# Patient Record
Sex: Male | Born: 1940
Health system: Southern US, Community
[De-identification: ages and names within clinical notes are randomized; demographics above are authoritative.]

## PROBLEM LIST (undated history)

## (undated) DIAGNOSIS — M1990 Unspecified osteoarthritis, unspecified site: Secondary | ICD-10-CM

## (undated) DIAGNOSIS — K5090 Crohn's disease, unspecified, without complications: Secondary | ICD-10-CM

## (undated) DIAGNOSIS — R0600 Dyspnea, unspecified: Secondary | ICD-10-CM

## (undated) DIAGNOSIS — C801 Malignant (primary) neoplasm, unspecified: Secondary | ICD-10-CM

## (undated) DIAGNOSIS — R7881 Bacteremia: Secondary | ICD-10-CM

## (undated) DIAGNOSIS — M259 Joint disorder, unspecified: Secondary | ICD-10-CM

## (undated) DIAGNOSIS — Z87442 Personal history of urinary calculi: Secondary | ICD-10-CM

## (undated) DIAGNOSIS — E1151 Type 2 diabetes mellitus with diabetic peripheral angiopathy without gangrene: Secondary | ICD-10-CM

## (undated) DIAGNOSIS — I82409 Acute embolism and thrombosis of unspecified deep veins of unspecified lower extremity: Secondary | ICD-10-CM

## (undated) DIAGNOSIS — I2699 Other pulmonary embolism without acute cor pulmonale: Secondary | ICD-10-CM

## (undated) DIAGNOSIS — I739 Peripheral vascular disease, unspecified: Secondary | ICD-10-CM

## (undated) DIAGNOSIS — Z85828 Personal history of other malignant neoplasm of skin: Secondary | ICD-10-CM

## (undated) DIAGNOSIS — E119 Type 2 diabetes mellitus without complications: Secondary | ICD-10-CM

## (undated) HISTORY — PX: COLON SURGERY: SHX602

## (undated) HISTORY — PX: CHOLECYSTECTOMY: SHX55

## (undated) HISTORY — PX: SHOULDER SURGERY: SHX246

## (undated) HISTORY — PX: ABDOMINAL SURGERY: SHX537

## (undated) HISTORY — PX: JOINT REPLACEMENT: SHX530

## (undated) HISTORY — PX: HIP SURGERY: SHX245

---
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
## 2001-01-29 ENCOUNTER — Emergency Department (HOSPITAL_COMMUNITY): Admission: EM | Admit: 2001-01-29 | Discharge: 2001-01-29 | Payer: Self-pay | Admitting: *Deleted

## 2001-02-05 ENCOUNTER — Emergency Department (HOSPITAL_COMMUNITY): Admission: EM | Admit: 2001-02-05 | Discharge: 2001-02-05 | Payer: Self-pay | Admitting: Emergency Medicine

## 2003-08-15 ENCOUNTER — Inpatient Hospital Stay (HOSPITAL_COMMUNITY): Admission: RE | Admit: 2003-08-15 | Discharge: 2003-08-18 | Payer: Self-pay | Admitting: Orthopedic Surgery

## 2008-03-14 ENCOUNTER — Inpatient Hospital Stay (HOSPITAL_COMMUNITY): Admission: EM | Admit: 2008-03-14 | Discharge: 2008-03-15 | Payer: Self-pay | Admitting: Emergency Medicine

## 2008-10-23 ENCOUNTER — Emergency Department (HOSPITAL_COMMUNITY): Admission: EM | Admit: 2008-10-23 | Discharge: 2008-10-24 | Payer: Self-pay | Admitting: Emergency Medicine

## 2009-10-05 ENCOUNTER — Inpatient Hospital Stay (HOSPITAL_COMMUNITY): Admission: RE | Admit: 2009-10-05 | Discharge: 2009-10-07 | Payer: Self-pay | Admitting: Orthopedic Surgery

## 2010-07-09 ENCOUNTER — Emergency Department (HOSPITAL_BASED_OUTPATIENT_CLINIC_OR_DEPARTMENT_OTHER)
Admission: EM | Admit: 2010-07-09 | Discharge: 2010-07-09 | Payer: Self-pay | Source: Home / Self Care | Admitting: Emergency Medicine

## 2010-09-17 LAB — CBC
MCV: 83.4 fL (ref 78.0–100.0)
Platelets: 265 10*3/uL (ref 150–400)
RBC: 5.9 MIL/uL — ABNORMAL HIGH (ref 4.22–5.81)
RDW: 13.2 % (ref 11.5–15.5)
WBC: 26.4 10*3/uL — ABNORMAL HIGH (ref 4.0–10.5)

## 2010-09-17 LAB — BASIC METABOLIC PANEL
BUN: 19 mg/dL (ref 6–23)
Calcium: 10.4 mg/dL (ref 8.4–10.5)
Chloride: 104 mEq/L (ref 96–112)
Creatinine, Ser: 1.3 mg/dL (ref 0.4–1.5)
GFR calc Af Amer: >60 mL/min (ref >60)
GFR calc non Af Amer: 55 mL/min — ABNORMAL LOW (ref >60)

## 2010-09-17 LAB — DIFFERENTIAL
Basophils Absolute: 0 10*3/uL (ref 0.0–0.1)
Eosinophils Absolute: 0 10*3/uL (ref 0.0–0.7)
Lymphocytes Relative: 11 % — ABNORMAL LOW (ref 12–46)
Monocytes Relative: 10 % (ref 3–12)
Neutro Abs: 20.9 10*3/uL — ABNORMAL HIGH (ref 1.7–7.7)
Neutrophils Relative %: 79 % — ABNORMAL HIGH (ref 43–77)

## 2010-09-26 LAB — CBC
HCT: 38.3 % — ABNORMAL LOW (ref 39.0–52.0)
HCT: 39 % (ref 39.0–52.0)
Hemoglobin: 12.6 g/dL — ABNORMAL LOW (ref 13.0–17.0)
Hemoglobin: 12.8 g/dL — ABNORMAL LOW (ref 13.0–17.0)
Hemoglobin: 12.9 g/dL — ABNORMAL LOW (ref 13.0–17.0)
MCHC: 33 g/dL (ref 30.0–36.0)
MCHC: 33.1 g/dL (ref 30.0–36.0)
Platelets: 195 10*3/uL (ref 150–400)
RBC: 4.23 MIL/uL (ref 4.22–5.81)
RDW: 13.2 % (ref 11.5–15.5)
RDW: 13.6 % (ref 11.5–15.5)
WBC: 23.4 10*3/uL — ABNORMAL HIGH (ref 4.0–10.5)

## 2010-09-26 LAB — BASIC METABOLIC PANEL
BUN: 16 mg/dL (ref 6–23)
CO2: 28 mEq/L (ref 19–32)
Calcium: 8.1 mg/dL — ABNORMAL LOW (ref 8.4–10.5)
GFR calc non Af Amer: >60 mL/min (ref >60)
Glucose, Bld: 134 mg/dL — ABNORMAL HIGH (ref 70–99)
Glucose, Bld: 189 mg/dL — ABNORMAL HIGH (ref 70–99)
Potassium: 3.8 mEq/L (ref 3.5–5.1)
Potassium: 4.3 mEq/L (ref 3.5–5.1)
Sodium: 135 mEq/L (ref 135–145)
Sodium: 138 mEq/L (ref 135–145)

## 2010-09-30 LAB — COMPREHENSIVE METABOLIC PANEL
AST: 38 U/L — ABNORMAL HIGH (ref 0–37)
CO2: 30 mEq/L (ref 19–32)
Calcium: 9.6 mg/dL (ref 8.4–10.5)
Creatinine, Ser: 1.15 mg/dL (ref 0.4–1.5)
GFR calc Af Amer: >60 mL/min (ref >60)
GFR calc non Af Amer: >60 mL/min (ref >60)
Total Protein: 7.2 g/dL (ref 6.0–8.3)

## 2010-09-30 LAB — CBC
MCHC: 33.1 g/dL (ref 30.0–36.0)
MCV: 89.9 fL (ref 78.0–100.0)
Platelets: 216 10*3/uL (ref 150–400)
RBC: 5.46 MIL/uL (ref 4.22–5.81)
RDW: 13 % (ref 11.5–15.5)

## 2010-09-30 LAB — APTT: aPTT: 43 seconds — ABNORMAL HIGH (ref 24–37)

## 2010-09-30 LAB — CROSSMATCH: Antibody Screen: NEGATIVE

## 2010-09-30 LAB — URINALYSIS, ROUTINE W REFLEX MICROSCOPIC
Bilirubin Urine: NEGATIVE
Hgb urine dipstick: NEGATIVE
Ketones, ur: NEGATIVE mg/dL
Protein, ur: NEGATIVE mg/dL
Urobilinogen, UA: 0.2 mg/dL (ref 0.0–1.0)

## 2010-09-30 LAB — DIFFERENTIAL
Eosinophils Relative: 0 % (ref 0–5)
Lymphocytes Relative: 20 % (ref 12–46)
Lymphs Abs: 1.8 10*3/uL (ref 0.7–4.0)

## 2010-09-30 LAB — PROTIME-INR
INR: 0.94 (ref 0.00–1.49)
Prothrombin Time: 12.5 seconds (ref 11.6–15.2)

## 2010-10-17 LAB — CBC
HCT: 52.9 % — ABNORMAL HIGH (ref 39.0–52.0)
MCV: 89.8 fL (ref 78.0–100.0)
Platelets: 214 10*3/uL (ref 150–400)
RDW: 12.8 % (ref 11.5–15.5)

## 2010-10-17 LAB — URINALYSIS, ROUTINE W REFLEX MICROSCOPIC
Nitrite: NEGATIVE
Specific Gravity, Urine: 1.037 — ABNORMAL HIGH (ref 1.005–1.030)
Urobilinogen, UA: 0.2 mg/dL (ref 0.0–1.0)

## 2010-10-17 LAB — COMPREHENSIVE METABOLIC PANEL
Albumin: 4.1 g/dL (ref 3.5–5.2)
BUN: 23 mg/dL (ref 6–23)
Chloride: 101 mEq/L (ref 96–112)
Creatinine, Ser: 1.62 mg/dL — ABNORMAL HIGH (ref 0.4–1.5)
Glucose, Bld: 180 mg/dL — ABNORMAL HIGH (ref 70–99)
Total Bilirubin: 1.9 mg/dL — ABNORMAL HIGH (ref 0.3–1.2)
Total Protein: 7.4 g/dL (ref 6.0–8.3)

## 2010-10-17 LAB — URINE MICROSCOPIC-ADD ON

## 2010-10-17 LAB — DIFFERENTIAL
Basophils Absolute: 0.1 10*3/uL (ref 0.0–0.1)
Lymphocytes Relative: 2 % — ABNORMAL LOW (ref 12–46)
Monocytes Absolute: 1.1 10*3/uL — ABNORMAL HIGH (ref 0.1–1.0)
Neutro Abs: 19.9 10*3/uL — ABNORMAL HIGH (ref 1.7–7.7)
Neutrophils Relative %: 92 % — ABNORMAL HIGH (ref 43–77)

## 2010-11-20 NOTE — Discharge Summary (Signed)
NAME:  Rodney Sullivan, Rodney Sullivan NO.:  [id number]   MEDICAL RECORD NO.:  [id number]          PATIENT TYPE:  INP   LOCATION:  7262                         FACILITY:  [facility]   PHYSICIAN:  Sherryl Manges, M.D.  DATE OF BIRTH:  Feb 14, 1941   DATE OF ADMISSION:  03/14/2008  DATE OF DISCHARGE:  03/15/2008                               DISCHARGE SUMMARY   PRIMARY MEDICAL DOCTOR:  Harriet Butte, [facility], [place], [facility] ,  [place], [place].   PRIMARY GASTROENTEROLOGIST:  Dr. Virgel Bouquet,  [place], [place].   DISCHARGE DIAGNOSIS:  Flare-up of Crohn's disease.   DISCHARGE MEDICATIONS:  1. Cimzia s.c. x1 per month.  2. Pentasa 500 mg p.o. b.i.d.  3. AndroGel in pre-admission dosage.  4. Kapidex 1 tablet p.o. daily.  5. Singulair 10 mg p.o. daily.  6. Allopurinol 100 mg p.o. daily.  7. Prednisone 10 mg p.o. daily.  8. Benadryl 25 mg p.o. b.i.d.  9. Calcium in pre-admission dosage.  10.Ciprofloxacin 500 mg p.o. b.i.d. for 7 days only.  11.Flagyl 500 mg p.o. t.i.d. for 7 days only.   PROCEDURES.:  1. Abdominal/chest x-ray dated March 14, 2008.  This showed      nonobstructive bowel gas pattern, nonspecific acid levels with      nondistended bowel.  Right hip total arthroplasty.  Avascular      necrosis left femoral head not excluded.  Low lung volumes was      grossly clear lungs.  2. Abdominal/pelvic CT scan dated March 14, 2008.  This showed      evidence of recurrent Crohn's disease that involved the near      terminal ileum and proximal colon.  Linear soft  tissue extends      from the duodenum to the distal small bowel and proximal colon.      Cannot exclude fistula.  No acute findings in the pelvis.   CONSULTATIONS:  Ronald Lobo, M.D., Gastroenterologist.   ADMISSION HISTORY:  As in H&P notes of March 14, 2008 dictated by Dr.  Gean Birchwood.  However in brief, this is a 70 year old male, with a  known history of Crohn's disease  since 1962, status post right-sided  bowel resection, status post cholecystectomy, avascular necrosis right  hip status post hip surgery, gout, allergic rhinitis, presenting with a  2 day history of right lower quadrant abdominal pain, associated with 2-  3 episodes of vomiting and  one episode of diarrhea.  He was admitted  for further evaluation, investigation and management.   CLINICAL COURSE.:  1. Flare-up of Crohn's disease.  For details of presentation, refer to      admission history above.  The patient was managed with bowel rest,      intravenous fluid hydration, parenteral steroids, as well as      intravenous Flagyl and Ciprofloxacin.  He was continued on pre-      admission doses of Pentasa.  GI consultation was kindly provided by      Dr. Ronald Lobo.  The patient experienced dramatic improvement      in symptoms  by a.m. of March 15, 2008, and was very keen to be      discharged today.  Per GI opinion, he was deemed clinically stable      for discharge.  He did have intriguing findings on his      abdominal/pelvis CT scan.  However, GI has reviewed this and felt      that the findings were not unduly worrisome.  I did have a      telephone discussion with the patient's primary MD, Harriet Butte, [place],      [place], [facility], [place], [place], who      assured me that the patient's CT scans were always difficult to      interpret and that in the past, the patient has had dramatic      response to short-term treatment for flare-up of his Crohn's      disease.  Be that as it may, the patient was very keen to be      discharged, was deemed clinically stable, and was subsequently      discharged.  GI recommendation: 7-day course of Ciprofloxacin and      Flagyl as well as oral Prednisone.  He has been recommended to      continue to follow up with his primary MD and with his primary      gastroenterologist, Dr. Ferdinand Lango, in [place], [place].   1.  History of gout.  There were no problems referable to this.   1. History of allergic rhinitis.  This did not prove problematic.   DISPOSITION:  The patient was discharged on March 15, 2008.  He has  been recommended to increase activity slowly.   DIET:  Low residue.   FOLLOWUP INSTRUCTIONS:  To follow up with his primary MD, Harriet Butte,  [place], [place] and his primary gastroenterologist, Dr. Ferdinand Lango in  [place],  [place], within the coming week. All of this has been  communicated to the patient.  He has verbalized understanding.      Sherryl Manges, M.D.  Electronically Signed     CO/MEDQ  D:  03/15/2008  T:  03/15/2008  Job:  [id number]   cc:   Virgel Bouquet  Fax: [phone]   Harriet Butte, [place], [facility]  [place], [place]

## 2010-11-20 NOTE — H&P (Signed)
NAME:  Rodney Sullivan, Rodney Sullivan NO.:  [id number]   MEDICAL RECORD NO.:  [id number]          PATIENT TYPE:  EMS   LOCATION:  ED                           FACILITY:  [facility]   PHYSICIAN:  Rise Patience, MDDATE OF BIRTH:  [birth year]   DATE OF ADMISSION:  03/14/2008  DATE OF DISCHARGE:                              HISTORY & PHYSICAL   The patient is unassigned.   CHIEF COMPLAINT:  Right lower quadrant pain.   HISTORY OF PRESENT ILLNESS:  The patient is a 70 year old male with  history of Crohn's disease since 1962 on prednisone and Cimzia presented  to the ER complaining of right lower quadrant pain yesterday morning.  The patient had 2 or 3 episodes of vomiting which had no blood in it and  1 episode of diarrhea.  The patient in addition was found to have fever  along with a UA which was compatible with UTI.  CAT scan of the abdomen  and pelvis done in the ER was also showing  of Crohn's disease.  The  patient is being admitted for further management.  The patient states  abdominal pain is dull aching of the right upper and lower quadrant,  nonradiating, constant which is improved now along with as earlier  mentioned nausea and vomiting twice and episode of diarrhea.  The  patient does not complain of any dysuria.  Denies any chest pain,  shortness of breath, dizziness, loss of consciousness, weakness of  limbs.   PAST MEDICAL HISTORY:  1. Crohn's disease on prednisone and Cimzia.  2. Avascular necrosis of right hip.  3. Gout.  4. History of allergic rhinitis.   PAST SURGICAL HISTORY:  Cholecystectomy, right-sided bowel resection in  1962 and 1964 and hip surgery.   MEDICATIONS ON ADMISSION:  1. The patient is on Cimzia every month IV infusion.  2. Pentasa 500 mg twice a day.  3. AndroGel.  4. Kapidex.  5. Singulair 10 mg daily.  6. Allopurinol 100 mg p.o. daily.  7. Prednisone 10 mg daily.  8. Benadryl 25 mg b.i.d.  9. Calcium.   ALLERGIES:  No known drug  allergies.   SOCIAL HISTORY:  The patient lives with his wife, denies smoking  cigarettes, drinking alcohol or using illegal drugs.   FAMILY HISTORY:  Noncontributory.   REVIEW OF SYSTEMS:  As in history of present illness, nothing of  significance.   PHYSICAL EXAMINATION:  GENERAL:  The patient examined at bedside, not in  acute distress.  VITAL SIGNS:  Blood pressure is 178/69, pulse 90 per minute, temperature  101.7, respirations 18 per minute, O2 saturation 96%.  HEENT:  Sclerae  anicteric.  No pallor.  CHEST:  Bilateral air entry present.  No rhonchi.  No crepitation.  HEART:  S1, S2 heard.  ABDOMEN:  Soft and nontender.  Bowel sounds heard.  No guarding or  rigidity.  No discoloration.  CNS:  The patient is alert, awake, oriented to time, place and person.  Moves upper and lower extremities 5/5.  EXTREMITIES:  Peripheral pulses felt.  No edema.   LABORATORIES:  CT of the  pelvis shows evidence of recurrent Crohn's  disease of the  terminal ileum and proximal colon.  Linear soft tissue  extends from the duodenum to the distal small bowel and proximal colon,  cannot exclude fistula.  Pelvis:  No acute findings.  CBC:  WBC is 10.4,  hemoglobin 17, hematocrit 50, platelets 136, neutrophils 83%, PT/INR 14  and 1.1 respectively.  Complete metabolic panel:  Sodium 753, potassium  3.7, chloride 105, carbon dioxide 24, glucose 118, BUN 17, creatinine  1.3, total bilirubin 1.2, alkaline phosphatase 48, AST 34, ALT 32, total  protein 6.5, albumin 3.7, calcium 9.1.  UA:  Color was amber with small  ketones 40, blood negative, protein 30, nitrites negative, leukocytes  small, WBC 3-6, bacteria rare.   ASSESSMENT:  1. Exacerbation of Crohn's disease.  2. Urinary tract infection.  3. History of gout.  4. History of allergic rhinitis.  5. History of avascular necrosis of right hip.   PLAN:  Admit the patient to medical floor.  Will place the patient on IV  fluids, n.p.o. except  medication.  Follow blood cultures and urine  cultures.  Continue Flagyl and Cipro, IV steroids.  Will get a  GI  consult for further recommendations.  Further recommendations as the  patient's condition evolves.      Rise Patience, MD  Electronically Signed     ANK/MEDQ  D:  03/14/2008  T:  03/14/2008  Job:  [id number]

## 2010-11-23 NOTE — Op Note (Signed)
NAME:  Rodney Sullivan, Rodney Sullivan                          ACCOUNT NO.:  [id number]   MEDICAL RECORD NO.:  [id number]                   PATIENT TYPE:  INP   LOCATION:  5007                                 FACILITY:  [facility]   PHYSICIAN:  John L. Rendall III, M.D.           DATE OF BIRTH:  [birth year]   DATE OF PROCEDURE:  08/15/2003  DATE OF DISCHARGE:                                 OPERATIVE REPORT   PREOPERATIVE DIAGNOSIS:  Aseptic necrosis with osteoarthritic change, right  hip.   POSTOPERATIVE DIAGNOSIS:  Aseptic necrosis with osteoarthritis change, right  hip.   PROCEDURE:  Right Prodigy total hip replacement.   SURGEON:  John L. Rendall, M.D.   ASSISTANT:  Vonita Moss. Duffy, P.A.-C.   ANESTHESIA:  General.   PATHOLOGY:  The patient has ping-pong ball like texture to the femoral head  with wrinkles in it, and there is degenerative change of the adjacent  acetabulum both off center around the fovea and centrally.   DESCRIPTION OF PROCEDURE:  Under general anesthesia, the patient was placed  in the left lateral decubitus position, and the right hip was prepared with  DuraPrep and draped as a sterile field.  An approximate 6 inch incision was  made splitting the IT band in the line of its fibers.  Charnley retractor  was inserted.  The short external rotators and hip capsule were taken down  from bone with electrocautery.  About dozen bleeding vessels were  encountered that all cauterized.  The hip capsule was then opened in a T-  shaped manner, and the hip was dislocated.  The superior femoral neck was  exposed.  The canal finder and IM initiator are used in reverse order and  then the femoral canal is progressively reamed up to 16 mm.  The femoral  neck is then cut using a template to assist in the angle of this.  The  femoral canal was then progressively rasped using 12, 13.5, 15 and 16.5  mirror rasps.  A calcar reamer was used on the last two to get an optimal  fit on the calcar.   Excellent fit was obtained with the final rasp.  At this  point, attention was turned to the acetabulum.  It is exposed with two  Cobra's inferiorly, the hip superiorly.  The labrum and ligamentum teres  were excised.  The acetabulum was then progressively reamed up to a size 51  or 52 acetabulum.  A trial acetabular component is impacted and poly is  inserted in it.  A trial rasp both AML standard or AML narrow with a 32 ball  0 and +5 were used.  It dislocates a little too easily in internal rotation,  but the Prodigy neck is more stable and fits better.  Consequently,  permanent components are then obtained for the Prodigy 16.5 narrow, +5 32 mm  hip ball and the pinnacle cup with poly for a 32 mm  hip ball within the 52  mm acetabulum.  Permanent components are inserted and after all are  inserted, trial range of motion reveals excellent fit, alignment and  stability.  It should be noted excellent scratch fit was noted on the  femoral component for the last 5 cm of insertion.  The hip capsule was then  closed with #1 Ticron.  Short external rotators were reattached with #1  Ticron, IT band closed with mattress suture, #1 Ticron, subcu with 0 and 2-0  Vicryl and skin with clips.  Operative time approximately 55 minutes.  The  patient tolerated the procedure well and returned to recovery in good  condition.                                               John L. Wynona Luna, M.D.   Judie Grieve  D:  08/15/2003  T:  08/15/2003  Job:  [id number]

## 2010-11-23 NOTE — Discharge Summary (Signed)
NAME:  Rodney Sullivan, Rodney Sullivan                          ACCOUNT NO.:  [id number]   MEDICAL RECORD NO.:  [id number]                   PATIENT TYPE:  INP   LOCATION:  5007                                 FACILITY:  [facility]   PHYSICIAN:  John L. Rendall, M.D.               DATE OF BIRTH:  28-Feb-1941   DATE OF ADMISSION:  08/15/2003  DATE OF DISCHARGE:  08/18/2003                                 DISCHARGE SUMMARY   ADMITTING DIAGNOSES:  1. Aseptic necrosis, right hip.  2. Hiatal hernia.  3. Crohn's disease.  4. Gout.   DISCHARGE DIAGNOSES:  1. Status post right AML total hip.  2. Acute blood loss anemia secondary to surgery, asymptomatic.  3. Crohn's disease.  4. Gout.  5. Hiatal hernia.   HISTORY OF PRESENT ILLNESS:  [name] is a 70 year old white male with a  history of Crohn's disease and chronic prednisone use.  The patient started  developing right hip and leg discomfort around August of 2004.  Initial x-  rays were benign, however, over time, his pain progressed and therefore an  MRI of his right hip was obtained.  MRI showed significant aseptic necrosis  of his right hip.  The patient's right hip pain is mostly located in the  lateral aspect of his right thigh.  Pain causes him to limp significantly.  He has an occasional sensation of grinding in the hip.  He does have night  pain.  He uses a cane for assistance with ambulation.  Due to the patient's  asymptomatic necrosis of right hip, he was admitted to [facility]  on August 15, 2003 to undergo a right total hip arthroplasty.   ALLERGIES:  No known drug allergies.  He does have a food allergy to [facility].   CURRENT MEDICATIONS:  1. Allopurinol 100 mg p.o. daily.  2. Asacol 800 mg 1 p.o. t.i.d.  3. Prednisone 10 mg p.o. every other day.  4. Tramadol 50 mg p.o. b.i.d.  5. Vicodin 1-2 tablets daily p.r.n. pain.  6. Benadryl 25 mg p.o. b.i.d.  7. Multivitamin with iron.  8. Calcium plus vitamin D.  9. Investigational   medication related to Remicade, tissue-necrosing factor     medication for Crohn's disease.  The patient takes 1 dosage every 2     weeks.  Dr. Virgel Bouquet is the physician involved with this     investigational medication at [facility].   SURGICAL PROCEDURE:  The patient was taken to the operating room on August 15, 2003 by Dr. Jenny Reichmann L. Rendall, assisted by Vonita Moss. Duffy, P.A.-C.  The  patient was placed under general anesthesia and the Prodigy total hip  replacement was performed.  Components used:  Prodigy 6.5 narrow, +5 32-mm  hip ball and a Pinnacle cup with a poly for a 32-mm hip ball within the 52-  mm acetabulum.  The patient tolerated the procedure  well and returned to  recovery in good and stable condition.   CONSULTS:  1. PT.  2. Case Management.   HOSPITAL COURSE:  The patient developed acute blood loss anemia secondary to  surgery, however, remained asymptomatic and required no blood products.  The  patient did develop transient fever, however, the patient was afebrile at  time of discharge.  The patient did develop leukocytosis, however, this was  resolving at the time of discharge.  White blood count was 13,900.  Leukocytosis was felt to be due to chronic prednisone use and surgery.  At  the time of discharge, patient's vital signs were stable and he was  afebrile.  He was discharged to home on postoperative day 3 in good and  stable condition.   LABORATORIES:  Routine labs on admission:  CBC -- white blood count 10.8,  hemoglobin 13.3, hematocrit at 39.6, platelets 395,000.  Coagulations on  admission:  PT 13, INR 1.0, PTT 35.  Routine chemistries:  Sodium 138,  potassium 4.2, chloride 103, bicarb 28, glucose 143, BUN 10, creatinine 1.0.  Hepatic enzymes were negative.  Urinalysis on admission was negative.   X-rays dated August 15, 2003:  AP pelvis with AP frog-leg view of the right  hip showed deformity of the right femoral head suggesting  avascular  necrosis.  No acute abnormalities were noted.  Postoperative right hip, 2  views, dated August 15, 2003 showed satisfactory appearance following right  total hip replacement.  No periprosthetic fractures were noted.   DISCHARGE INSTRUCTIONS:   MEDICATIONS:  The patient may resume preoperative medications except for  Vicodin while on other pain medicines.  The following medications were added  to his medication regimen:  1. Arixtra 2.5 mg 1 injection subcutaneously at 8 p.m. daily, last dose on     September 18, 2003, then the patient is to begun an 81 mg aspirin, enteric     coated, on August 22, 2003 for 1 month.  2. OxyContin 10 mg sustained release 1 tablet q.12 h.  3. Percocet 5 mg 1-2 tablets q.4-6 h. as needed for pain.   ACTIVITY:  The patient is weightbearing as tolerated with a walker.   DIET:  No restrictions.   WOUND CARE:  The patient is to perform daily dressing changes.  May shower  after 2 days if no drainage.  The patient is to call  Dr. Roxan Diesel office  if he develops any of the following:  Temperature greater than 101.5,  chills, swelling, foul-smelling drainage from the wound site or pains not  controlled with pain medication.   FOLLOWUP:  The patient needs to follow up with Dr. Telford Nab in approximately  10-12 days from day of discharge.  The patient is to call our office at [phone] to make an appointment.      Erskine Emery, P.A.                       John L. Telford Nab, M.D.    GC/MEDQ  D:  09/20/2003  T:  09/23/2003  Job:  [id number]   cc:   Virgel Bouquet  [address]  [place]  [place] [address]  Fax: [place]. Rendall III, M.D.  [place]  [place] [address]  Fax: [phone]

## 2010-11-23 NOTE — H&P (Signed)
NAME:  Rodney Sullivan, Rodney Sullivan                          ACCOUNT NO.:  [id number]   MEDICAL RECORD NO.:  [id number]                   PATIENT TYPE:  INP   LOCATION:  NA                                   FACILITY:  [facility]   PHYSICIAN:  Evert Kohl, P.A.                DATE OF BIRTH:  08/24/1940   DATE OF ADMISSION:  DATE OF DISCHARGE:                                HISTORY & PHYSICAL   CHIEF COMPLAINT:  Right hip pain.   HISTORY OF PRESENT ILLNESS:  The patient is a 70 year old white male with a  history of Crohn's disease and chronic prednisone use. The patient started  developing right hip limp and discomfort around last August. Initial x-rays  were benign, but over time, his pain progressively increased with ambulation  and range of motion. The MRIs were obtained which shows significant aseptic  necrosis of his right hip. The patient states he has a significant limp. He  has pain in the lateral aspect of his thigh. It increases with time. He  occasionally has grinding in the hip. He does have night pain. He is  currently using a cane to assist ambulation. He has no previous injuries.   DRUG ALLERGIES:  No known drug allergies, but allergic to [facility].   CURRENT MEDICATIONS:  1. Allopurinol 100 mg p.o. daily  2. Asacol 800 mg p.o. t.i.d.  3. Prednisone 10 mg p.o. every other day with next dose on Friday, August 05, 2003.  4. Tramadol 50 mg p.o. b.i.d.  5. Vicodin one or two tablets daily p.r.n.  6. Benadryl 25 mg p.o. b.i.d.  7. Multivitamins with iron.  8. Calcium plus vitamin D.  9. Investigational medication related to Remicade, tissue necrosing factor     medication for his Crohn's disease. He is currently taking one dosage     every two weeks. He will have it the Monday previous to his surgery and     the week after his surgery. Dr. Harrell Lark is the physician with this     investigational medication at [facility].   CURRENT MEDICAL HISTORY:  1. Hiatal  hernia.  2. Crohn's disease.  3. Gout.   PAST SURGICAL HISTORY:  1. Nodule removed from his neck in 1957.  2. Bowel resection in 1962.  3. Repeat bowel resection in 1965.  4. Gallbladder surgery in 1997 x 2. The patient developed an ileus requiring     an NG tube with this procedure. Otherwise, the patient has not had any     other complications due to anesthesia.   SOCIAL HISTORY:  The patient is a healthy-appearing, well-developed 41-year-  old white male with no history of smoking or alcohol use. He is married. He  does have two grown children. He lives in a single-family home, two steps.  He is currently employed and working as a Therapist, sports at [employer].   FAMILY PHYSICIAN:  Dr. Harrell Lark at [facility] in [place].   FAMILY MEDICAL HISTORY:  Mother is alive in a nursing home with a history of  CVA. Father is deceased from an accident. The patient has one sister alive  and in good medical health.   REVIEW OF SYSTEMS:  Positive for flu this last week with significant GI  complications, but the patient currently states that over the last two days,  he is significantly improved and feels near normal with no lingering  sequelae. The patient does have upper and lower dentures. He does wear  glasses at times. He does have problems with diarrhea at times related to  his Crohn's disease.   PHYSICAL EXAMINATION:  VITAL SIGNS:  Height is 5 feet 7 inches. Weight is  162 pounds. Pulse 84 and regular. Respirations 12. Blood pressure 105/60.  The patient is afebrile.  GENERAL:  This is a healthy-appearing well-developed white male. He does  ambulate with his cane in his left hand. He does have a significant right-  sided limp when transitioning from the sitting to standing position. The  patient does appear to have some stiffness and discomfort, but is able to  get on and off the exam table by himself.  HEENT:  Head was normocephalic. Pupils equal, round and  reactive and  accommodating to light. Extraocular movements intact. Sclerae anicteric.  External ears without deformities. Hearing is grossly intact. Nasal septum  is midline. Oral buccal mucosa was pink and moist. Upper and lower dentures  were in place.  NECK:  Supple. No palpable lymphadenopathy. Thyroid region was nontender.  The patient had excellent range of motion of his cervical spine without any  difficulty or tenderness. He has no tenderness with percussion along the  entire spinal column.  CHEST:  Lung sounds were clear and equal bilaterally. No wheezing, rales,  rhonchi, or rubs noted.  HEART:  Regular rate and rhythm. S1 and S2 are auscultated. No murmurs, rubs  or gallops noted.  ABDOMEN:  Round, soft and bowel sounds were present throughout. No c.v.a.  region tenderness.  EXTREMITIES:  Upper extremities were symmetrical in size and shape. He had  full range of motion of his shoulders, elbows and wrists. Motor strength was  5/5.  Lower extremities: Right and left hip at full extension. Right hip and  left hip were able to flex up to 130 degrees. Right hip had 10 degrees  internal rotation before the patient started leaning and having discomfort.  He had 20 degrees external rotation. Left hip had 20 degrees internal and  external rotation without any discomfort. Bilateral knees were symmetrical  size and shape. No discomfort throughout. Normal range of motion. No  instability. The calves were nontender. Ankles were symmetrical with good  dorsi plantar flexion.  PERIPHERAL VASCULAR:  Carotid pulses were 2+ with no bruits. Radial pulses  were 2+. Dorsalis pedis and posterior tibial pulses were 2+. No lower  extremity edema or venostasis changes.  NEURO: The patient was conscious, alert and appropriate and easily  conversational with the examiner. Cranial nerves II through XII were grossly intact. Deep tendon reflexes of the upper and lower extremities were  symmetrical right  to left. He had no gross neurologic defects noted.  BREASTS, RECTAL AND GU:  Deferred at this time.   IMPRESSION:  1. Aseptic necrosis, right hip.  2. Hiatal hernia.  3. Crohn's disease.  4. Gout.   PLAN:  The patient will  undergo all routine laboratory test prior to having  his right total hip arthroplasty performed by Dr. Telford Nab at [facility] on August 14, 2002. The patient will undergo all routine  laboratory and tests prior to having this procedure.                                                Evert Kohl, P.A.    RWK/MEDQ  D:  08/04/2003  T:  08/04/2003  Job:  [id number]

## 2011-04-10 LAB — URINE CULTURE

## 2011-04-10 LAB — COMPREHENSIVE METABOLIC PANEL
ALT: 28
ALT: 32
AST: 34
Alkaline Phosphatase: 46
BUN: 14
CO2: 25
Calcium: 9.1
Chloride: 108
Creatinine, Ser: 1.39
GFR calc Af Amer: >60
GFR calc non Af Amer: 51 — ABNORMAL LOW
Glucose, Bld: 189 — ABNORMAL HIGH
Potassium: 4.5
Sodium: 137
Sodium: 139
Total Bilirubin: 0.9
Total Protein: 5.7 — ABNORMAL LOW
Total Protein: 6.5

## 2011-04-10 LAB — CBC
HCT: 45.2
Hemoglobin: 15.2
MCHC: 33.7
MCV: 86.8
RBC: 5.18
RDW: 13.5
RDW: 13.9
WBC: 9.6

## 2011-04-10 LAB — GLUCOSE, CAPILLARY
Glucose-Capillary: 159 — ABNORMAL HIGH
Glucose-Capillary: 188 — ABNORMAL HIGH
Glucose-Capillary: 213 — ABNORMAL HIGH

## 2011-04-10 LAB — POCT I-STAT, CHEM 8
Creatinine, Ser: 1.3
Glucose, Bld: 118 — ABNORMAL HIGH
HCT: 50
Hemoglobin: 17
Potassium: 3.7
TCO2: 24

## 2011-04-10 LAB — CULTURE, BLOOD (ROUTINE X 2)
Culture: NO GROWTH
Culture: NO GROWTH

## 2011-04-10 LAB — URINALYSIS, ROUTINE W REFLEX MICROSCOPIC
Hgb urine dipstick: NEGATIVE
Nitrite: NEGATIVE
Protein, ur: 30 — AB
Specific Gravity, Urine: 1.03
Urobilinogen, UA: 1

## 2011-04-10 LAB — DIFFERENTIAL
Eosinophils Absolute: 0
Eosinophils Relative: 0
Lymphocytes Relative: 7 — ABNORMAL LOW
Lymphs Abs: 0.8
Monocytes Relative: 10
Neutrophils Relative %: 83 — ABNORMAL HIGH

## 2011-04-10 LAB — TSH: TSH: 0.313 — ABNORMAL LOW

## 2011-04-10 LAB — URINE MICROSCOPIC-ADD ON

## 2011-04-10 LAB — PROTIME-INR
INR: 1.1
Prothrombin Time: 14

## 2011-04-10 LAB — LIPID PANEL: HDL: 32 — ABNORMAL LOW

## 2012-04-03 ENCOUNTER — Emergency Department (HOSPITAL_BASED_OUTPATIENT_CLINIC_OR_DEPARTMENT_OTHER)
Admission: EM | Admit: 2012-04-03 | Discharge: 2012-04-04 | Disposition: A | Discharge status: Home / Self Care | Payer: BC Managed Care – PPO | Attending: Emergency Medicine | Admitting: Emergency Medicine

## 2012-04-03 ENCOUNTER — Encounter (HOSPITAL_BASED_OUTPATIENT_CLINIC_OR_DEPARTMENT_OTHER): Payer: Self-pay | Admitting: Emergency Medicine

## 2012-04-03 DIAGNOSIS — K5010 Crohn's disease of large intestine without complications: Secondary | ICD-10-CM | POA: Insufficient documentation

## 2012-04-03 DIAGNOSIS — K56600 Partial intestinal obstruction, unspecified as to cause: Secondary | ICD-10-CM

## 2012-04-03 DIAGNOSIS — K56609 Unspecified intestinal obstruction, unspecified as to partial versus complete obstruction: Secondary | ICD-10-CM | POA: Insufficient documentation

## 2012-04-03 HISTORY — DX: Crohn's disease, unspecified, without complications: K50.90

## 2012-04-03 NOTE — ED Notes (Signed)
abd pain hx crohns

## 2012-04-04 ENCOUNTER — Emergency Department (HOSPITAL_BASED_OUTPATIENT_CLINIC_OR_DEPARTMENT_OTHER): Payer: BC Managed Care – PPO

## 2012-04-04 LAB — CBC WITH DIFFERENTIAL/PLATELET
Eosinophils Absolute: 0 10*3/uL (ref 0.0–0.7)
Eosinophils Relative: 0 % (ref 0–5)
Lymphs Abs: 3.4 10*3/uL (ref 0.7–4.0)
MCH: 29.4 pg (ref 26.0–34.0)
MCHC: 33.9 g/dL (ref 30.0–36.0)
MCV: 86.8 fL (ref 78.0–100.0)
Platelets: 286 10*3/uL (ref 150–400)
RBC: 5.3 MIL/uL (ref 4.22–5.81)
RDW: 13.1 % (ref 11.5–15.5)

## 2012-04-04 LAB — BASIC METABOLIC PANEL
Calcium: 10.5 mg/dL (ref 8.4–10.5)
GFR calc non Af Amer: 60 mL/min — ABNORMAL LOW (ref >90)
Glucose, Bld: 114 mg/dL — ABNORMAL HIGH (ref 70–99)
Sodium: 142 mEq/L (ref 135–145)

## 2012-04-04 MED ORDER — ONDANSETRON HCL 4 MG/2ML IJ SOLN
4.0000 mg | Freq: Once | INTRAMUSCULAR | Status: AC
  Administered 2012-04-04: 4 mg via INTRAVENOUS
  Filled 2012-04-04: qty 2

## 2012-04-04 MED ORDER — SODIUM CHLORIDE 0.9 % IV BOLUS (SEPSIS)
1000.0000 mL | Freq: Once | INTRAVENOUS | Status: AC
  Administered 2012-04-04: 1000 mL via INTRAVENOUS

## 2012-04-04 MED ORDER — ONDANSETRON 8 MG PO TBDP: 8.0000 mg | ORAL_TABLET | Freq: Three times a day (TID) | ORAL | Status: DC | PRN

## 2012-04-04 MED ORDER — DEXAMETHASONE SODIUM PHOSPHATE 10 MG/ML IJ SOLN
INTRAMUSCULAR | Status: AC
  Administered 2012-04-04: 10 mg via INTRAVENOUS
  Filled 2012-04-04: qty 1

## 2012-04-04 MED ORDER — DEXAMETHASONE SODIUM PHOSPHATE 10 MG/ML IJ SOLN: 10.0000 mg | Freq: Once | INTRAMUSCULAR | Status: DC

## 2012-04-04 MED ORDER — DEXAMETHASONE SODIUM PHOSPHATE 10 MG/ML IJ SOLN
10.0000 mg | Freq: Once | INTRAMUSCULAR | Status: AC
  Administered 2012-04-04: 10 mg via INTRAVENOUS

## 2012-04-04 MED ORDER — HYDROMORPHONE HCL PF 1 MG/ML IJ SOLN
1.0000 mg | Freq: Once | INTRAMUSCULAR | Status: AC
  Administered 2012-04-04: 1 mg via INTRAVENOUS
  Filled 2012-04-04: qty 1

## 2012-04-04 NOTE — ED Notes (Signed)
MD at bedside. 

## 2012-04-04 NOTE — ED Notes (Signed)
Hx of crohns.  abd pain ,nausea,bloating

## 2012-04-04 NOTE — ED Provider Notes (Signed)
History     CSN: [id number]  Arrival date & time 04/03/12  2328   First MD Initiated Contact with Patient 04/04/12 [phone]      Chief Complaint  Patient presents with  . Abdominal Pain    (Consider location/radiation/quality/duration/timing/severity/associated sxs/prior treatment) HPI Comments: Pt with hx of Crohns disease on prednisone and other immuno modulators comes in with cc of abd pain. Pt states that he started having abd pain, diffuse, dull, but severe earlier in the day, and it has worsened with time. There is associated nausea - started later in the evening, anorexia. No diarrhea, last BP was earlier in the day, passing flatus. No n/v/f/c. Pt has had occasional, but regular flare ups in the past, and the current sx is consistent with previous attacks. Pt called his GI doctor,. Dr. Ferdinand Lango, and was instructed to go to the ED, and they had requested a dose of decadron.  Patient is a 71 y.o. male presenting with abdominal pain. The history is provided by the patient.  Abdominal Pain The primary symptoms of the illness include abdominal pain and nausea. The primary symptoms of the illness do not include fever, shortness of breath, vomiting, diarrhea or dysuria.  Symptoms associated with the illness do not include chills or constipation.    Past Medical History  Diagnosis Date  . Crohn disease     Past Surgical History  Procedure Date  . Abdominal surgery     No family history on file.  History  Substance Use Topics  . Smoking status: Never Smoker   . Smokeless tobacco: Not on file  . Alcohol Use: No      Review of Systems  Constitutional: Positive for appetite change. Negative for fever, chills and activity change.  HENT: Negative for neck pain.   Eyes: Negative for visual disturbance.  Respiratory: Negative for cough, chest tightness and shortness of breath.   Cardiovascular: Negative for chest pain.  Gastrointestinal: Positive for nausea and abdominal pain.  Negative for vomiting, diarrhea, constipation, blood in stool, abdominal distention, anal bleeding and rectal pain.  Genitourinary: Negative for dysuria, enuresis and difficulty urinating.  Musculoskeletal: Negative for arthralgias.  Neurological: Negative for dizziness, light-headedness and headaches.  Psychiatric/Behavioral: Negative for confusion.    Allergies  Review of patient's allergies indicates no known allergies.  Home Medications  No current outpatient prescriptions on file.  BP 153/85  Pulse 106  Temp 98 F (36.7 C)  Resp 20  Ht 5' 7"  (1.702 m)  Wt 174 lb (78.926 kg)  BMI 27.25 kg/m2  SpO2 100%  Physical Exam  Nursing note and vitals reviewed. Constitutional: He is oriented to person, place, and time. He appears well-developed.  HENT:  Head: Normocephalic and atraumatic.  Eyes: Conjunctivae normal and EOM are normal. Pupils are equal, round, and reactive to light.  Neck: Normal range of motion. Neck supple.  Cardiovascular: Normal rate and regular rhythm.   Pulmonary/Chest: Effort normal and breath sounds normal.  Abdominal: Soft. Bowel sounds are normal. He exhibits no distension and no mass. There is tenderness. There is no rebound and no guarding.       Diffuse abd tenderness, worst over the periumbilical region, no rebound or guarding, no flank tenderness.  Neurological: He is alert and oriented to person, place, and time.  Skin: Skin is warm.    ED Course  Procedures (including critical care time)  Labs Reviewed  CBC WITH DIFFERENTIAL - Abnormal; Notable for the following:    WBC 16.1 (*)  Neutro Abs 11.2 (*)     Monocytes Absolute 1.6 (*)     All other components within normal limits  BASIC METABOLIC PANEL - Abnormal; Notable for the following:    Glucose, Bld 114 (*)     GFR calc non Af Amer 60 (*)     GFR calc Af Amer 69 (*)     All other components within normal limits  PHOSPHORUS   No results found.   No diagnosis found.    MDM    DDx includes: Pancreatitis Hepatobiliary pathology including cholecystitis Gastritis/PUD SBO ACS syndrome Aortic Dissection Colitis AAA Tumors Colitis Intra abdominal abscess Thrombosis Mesenteric ischemia Diverticulitis Peritonitis Appendicitis Hernia Nephrolithiasis Pyelonephritis UTI/Cystitis   Pt comes in with cc of abd pain. Pt has hx of chrohns, in remission with some immunosuppressants.  Exam shows diffuse tenderness in the abdomen, but mostly in the periumbulical region. Vitals are stable, and no concerning constitutional except for nausea.  We will get basilc labs and AAS, and serial exam Will give dex per GI request. Will get serial abd exam. If there is any abnormalities in the labs that is concerning or change in physical exam, we will get CT.  With crohns, and steroid use - concerns for PUD, perforated viscus, peritonitis, abd abscess.   4:04 AM AAS pending. Labs are normal. Pt is pain free, and the exam is a lot improved, with no tenderness with palpation. If AAS is assuring, we will d/c with GI followup              Varney Biles, MD 04/04/12 5929

## 2012-04-04 NOTE — ED Notes (Signed)
Pt vomited moderate amount of green emesis. Pt c/o increasing abd pain. MD made aware and encourage to see pt now.

## 2012-10-26 ENCOUNTER — Emergency Department (HOSPITAL_COMMUNITY)
Admission: EM | Admit: 2012-10-26 | Discharge: 2012-10-27 | Disposition: A | Discharge status: Home / Self Care | Payer: BC Managed Care – PPO | Attending: Emergency Medicine | Admitting: Emergency Medicine

## 2012-10-26 ENCOUNTER — Encounter (HOSPITAL_COMMUNITY): Payer: Self-pay | Admitting: *Deleted

## 2012-10-26 DIAGNOSIS — R109 Unspecified abdominal pain: Secondary | ICD-10-CM | POA: Insufficient documentation

## 2012-10-26 DIAGNOSIS — R197 Diarrhea, unspecified: Secondary | ICD-10-CM | POA: Insufficient documentation

## 2012-10-26 DIAGNOSIS — Z79899 Other long term (current) drug therapy: Secondary | ICD-10-CM | POA: Insufficient documentation

## 2012-10-26 DIAGNOSIS — Z9089 Acquired absence of other organs: Secondary | ICD-10-CM | POA: Insufficient documentation

## 2012-10-26 DIAGNOSIS — IMO0002 Reserved for concepts with insufficient information to code with codable children: Secondary | ICD-10-CM | POA: Insufficient documentation

## 2012-10-26 DIAGNOSIS — Z9889 Other specified postprocedural states: Secondary | ICD-10-CM | POA: Insufficient documentation

## 2012-10-26 DIAGNOSIS — R10813 Right lower quadrant abdominal tenderness: Secondary | ICD-10-CM | POA: Insufficient documentation

## 2012-10-26 DIAGNOSIS — K5090 Crohn's disease, unspecified, without complications: Secondary | ICD-10-CM | POA: Insufficient documentation

## 2012-10-26 MED ORDER — SODIUM CHLORIDE 0.9 % IV BOLUS (SEPSIS): 1000.0000 mL | Freq: Once | INTRAVENOUS | Status: DC

## 2012-10-26 NOTE — ED Notes (Addendum)
Pt refuses to have IV accessed. Was encouraged to have labwork. He states he has had Chron's x 50 years. Usually when he has a flare up he takes Dexamethasone IM

## 2012-10-26 NOTE — ED Notes (Signed)
Right side abd pain x 1 day- hx of crohn's, states pain consistent with flare up

## 2012-10-27 LAB — COMPREHENSIVE METABOLIC PANEL
AST: 31 U/L (ref 0–37)
Albumin: 3.6 g/dL (ref 3.5–5.2)
Calcium: 9.8 mg/dL (ref 8.4–10.5)
Chloride: 101 mEq/L (ref 96–112)
Creatinine, Ser: 1.05 mg/dL (ref 0.50–1.35)
Total Protein: 7.1 g/dL (ref 6.0–8.3)

## 2012-10-27 LAB — URINALYSIS, ROUTINE W REFLEX MICROSCOPIC
Glucose, UA: NEGATIVE mg/dL
Leukocytes, UA: NEGATIVE
pH: 5.5 (ref 5.0–8.0)

## 2012-10-27 LAB — POCT I-STAT, CHEM 8
BUN: 15 mg/dL (ref 6–23)
Chloride: 104 mEq/L (ref 96–112)
HCT: 49 % (ref 39.0–52.0)
Sodium: 139 mEq/L (ref 135–145)
TCO2: 26 mmol/L (ref 0–100)

## 2012-10-27 LAB — CBC
MCH: 28.4 pg (ref 26.0–34.0)
MCV: 87 fL (ref 78.0–100.0)
Platelets: 254 10*3/uL (ref 150–400)
RDW: 13.3 % (ref 11.5–15.5)
WBC: 14.8 10*3/uL — ABNORMAL HIGH (ref 4.0–10.5)

## 2012-10-27 LAB — URINE MICROSCOPIC-ADD ON

## 2012-10-27 MED ORDER — DEXAMETHASONE SODIUM PHOSPHATE 10 MG/ML IJ SOLN
8.0000 mg | Freq: Once | INTRAMUSCULAR | Status: AC
  Administered 2012-10-27: 8 mg via INTRAMUSCULAR
  Filled 2012-10-27: qty 1

## 2012-10-27 NOTE — ED Notes (Addendum)
Pt denies pain in abdomen. Encourage by MD to stay for further eval.

## 2012-10-27 NOTE — ED Provider Notes (Signed)
History     CSN: [id number]  Arrival date & time 10/26/12  2126   First MD Initiated Contact with Patient 10/26/12 2307      Chief Complaint  Patient presents with  . Abdominal Pain    (Consider location/radiation/quality/duration/timing/severity/associated sxs/prior treatment) HPI History provided by patient. Has longstanding history of Crohn's disease takes 12.5 mg daily prednisone. He is followed by GI at [facility] at home developed r sided ABD cramping that feels like a typical Crohn's flare. When he gets these symptoms he typically goes to his physician and gets dexamethasone 39m IM. He presents tonight requesting the same. He denies fevers or N/V.  He usually has loose stools daily and tonight around 10pm had a normal BM no blood. He had cramping discomfort mod in severity on arrival to the ED but now is pain free. He initially declined any blood work or imaging and after discussion agreed to lab work but declines any imaging.  He aslo declines any other medications except steroids as requested.    Past Medical History  Diagnosis Date  . Crohn disease     Past Surgical History  Procedure Laterality Date  . Abdominal surgery    . Colon surgery    . Cholecystectomy      No family history on file.  History  Substance Use Topics  . Smoking status: Never Smoker   . Smokeless tobacco: Never Used  . Alcohol Use: No      Review of Systems  Constitutional: Negative for fever and chills.  HENT: Negative for neck pain and neck stiffness.   Eyes: Negative for pain.  Respiratory: Negative for shortness of breath.   Cardiovascular: Negative for chest pain.  Gastrointestinal: Positive for abdominal pain. Negative for blood in stool.  Genitourinary: Negative for dysuria.  Musculoskeletal: Negative for back pain.  Skin: Negative for rash.  Neurological: Negative for headaches.  All other systems reviewed and are negative.    Allergies  Other  Home  Medications   Current Outpatient Rx  Name  Route  Sig  Dispense  Refill  . allopurinol (ZYLOPRIM) 100 MG tablet   Oral   Take 100 mg by mouth daily.         . calcium-vitamin D (OSCAL WITH D) 500-200 MG-UNIT per tablet   Oral   Take 1 tablet by mouth daily.         . Certolizumab Pegol (CIMZIA [place])   Subcutaneous   Inject 400 mg into the skin every 30 (thirty) days.         . cholecalciferol (VITAMIN D) 1000 UNITS tablet   Oral   Take 2,000 Units by mouth daily.         . diphenhydrAMINE (BENADRYL) 25 MG tablet   Oral   Take 25 mg by mouth 2 (two) times daily.         . mesalamine (PENTASA) 500 MG CR capsule   Oral   Take 2,000 mg by mouth 2 (two) times daily.         . montelukast (SINGULAIR) 10 MG tablet   Oral   Take 10 mg by mouth at bedtime.         . Multiple Vitamin (MULTIVITAMIN WITH MINERALS) TABS   Oral   Take 1 tablet by mouth daily.         .Marland Kitchenomega-3 acid ethyl esters (LOVAZA) 1 G capsule   Oral   Take 1 g by mouth 2 (two) times  daily.          . predniSONE (DELTASONE) 10 MG tablet   Oral   Take 10 mg by mouth daily.         . traMADol (ULTRAM) 50 MG tablet   Oral   Take 50 mg by mouth every 6 (six) hours as needed for pain.         . vitamin B-12 (CYANOCOBALAMIN) 1000 MCG tablet   Oral   Take 1,000 mcg by mouth daily.           BP 135/91  Pulse 114  Temp(Src) 98.2 F (36.8 C) (Oral)  Resp 20  SpO2 94%  Physical Exam  Constitutional: He is oriented to person, place, and time. He appears well-developed and well-nourished.  HENT:  Head: Normocephalic and atraumatic.  Eyes: EOM are normal. Pupils are equal, round, and reactive to light. No scleral icterus.  Neck: Neck supple.  Cardiovascular: Regular rhythm and intact distal pulses.   Pulmonary/Chest: Effort normal. No respiratory distress.  Abdominal: Soft.  Active bowel sounds with minimal RLQ tenderness. No guarding or rebound.   Musculoskeletal: Normal range of  motion. He exhibits no edema.  Neurological: He is alert and oriented to person, place, and time.  Skin: Skin is warm and dry.    ED Course  Procedures (including critical care time)  Results for orders placed during the hospital encounter of 10/26/12  CBC      Result Value Range   WBC 14.8 (*) 4.0 - 10.5 K/uL   RBC 5.17  4.22 - 5.81 MIL/uL   Hemoglobin 14.7  13.0 - 17.0 g/dL   HCT 45.0  39.0 - 52.0 %   MCV 87.0  78.0 - 100.0 fL   MCH 28.4  26.0 - 34.0 pg   MCHC 32.7  30.0 - 36.0 g/dL   RDW 13.3  11.5 - 15.5 %   Platelets 254  150 - 400 K/uL  COMPREHENSIVE METABOLIC PANEL      Result Value Range   Sodium 138  135 - 145 mEq/L   Potassium 4.3  3.5 - 5.1 mEq/L   Chloride 101  96 - 112 mEq/L   CO2 26  19 - 32 mEq/L   Glucose, Bld 124 (*) 70 - 99 mg/dL   BUN 15  6 - 23 mg/dL   Creatinine, Ser 1.05  0.50 - 1.35 mg/dL   Calcium 9.8  8.4 - 10.5 mg/dL   Total Protein 7.1  6.0 - 8.3 g/dL   Albumin 3.6  3.5 - 5.2 g/dL   AST 31  0 - 37 U/L   ALT 34  0 - 53 U/L   Alkaline Phosphatase 62  39 - 117 U/L   Total Bilirubin 0.5  0.3 - 1.2 mg/dL   GFR calc non Af Amer 70 (*) >90 mL/min   GFR calc Af Amer 81 (*) >90 mL/min  LIPASE, BLOOD      Result Value Range   Lipase 47  11 - 59 U/L  POCT I-STAT, CHEM 8      Result Value Range   Sodium 139  135 - 145 mEq/L   Potassium 4.3  3.5 - 5.1 mEq/L   Chloride 104  96 - 112 mEq/L   BUN 15  6 - 23 mg/dL   Creatinine, Ser 1.20  0.50 - 1.35 mg/dL   Glucose, Bld 125 (*) 70 - 99 mg/dL   Calcium, Ion 1.18  1.13 - 1.30 mmol/L   TCO2 26  0 - 100 mmol/L   Hemoglobin 16.7  13.0 - 17.0 g/dL   HCT 49.0  39.0 - 52.0 %     12:39 AM PT declines any imaging. I recommended xray/ CT scan. PT prefers to see GI in the am. He states understanding risk of missed small bowel obstruction or other serious etiology.   1:08 AM repeat VS remains tachycardic, states pain returning, he again refuses any imaging or further work up - he and wife understand my  recommendations and need for further evaluation. PT requesting to be discharged. A/O x 4.   MDM  ABD pain h/o Crohns  Labs reviewed - has elevated WBC - his baseline versus previous labs is much higher.   IM dexamethasone  PT declines imaging, wishes to be discharged home, he remains tachycardic. Multiple conversations discharged home AMA        Teressa Lower, MD 10/27/12 [phone]

## 2013-07-13 ENCOUNTER — Ambulatory Visit: Payer: Medicare Other | Admitting: Neurology

## 2013-11-05 DIAGNOSIS — B9620 Unspecified Escherichia coli [E. coli] as the cause of diseases classified elsewhere: Secondary | ICD-10-CM

## 2013-11-05 DIAGNOSIS — R7881 Bacteremia: Secondary | ICD-10-CM

## 2013-11-05 HISTORY — DX: Unspecified Escherichia coli (E. coli) as the cause of diseases classified elsewhere: B96.20

## 2013-11-05 HISTORY — DX: Bacteremia: R78.81

## 2013-11-27 DIAGNOSIS — I82409 Acute embolism and thrombosis of unspecified deep veins of unspecified lower extremity: Secondary | ICD-10-CM

## 2013-11-27 DIAGNOSIS — I2699 Other pulmonary embolism without acute cor pulmonale: Secondary | ICD-10-CM

## 2013-11-27 HISTORY — DX: Other pulmonary embolism without acute cor pulmonale: I26.99

## 2013-11-27 HISTORY — DX: Acute embolism and thrombosis of unspecified deep veins of unspecified lower extremity: I82.409

## 2013-12-14 ENCOUNTER — Emergency Department: Payer: Medicare Other

## 2013-12-14 ENCOUNTER — Emergency Department (HOSPITAL_COMMUNITY): Payer: Medicare HMO

## 2013-12-14 ENCOUNTER — Inpatient Hospital Stay (HOSPITAL_COMMUNITY): Payer: Medicare HMO

## 2013-12-14 ENCOUNTER — Inpatient Hospital Stay (HOSPITAL_COMMUNITY)
Admission: EM | Admit: 2013-12-14 | Discharge: 2013-12-17 | DRG: 871 | Disposition: A | Discharge status: Home Health | Payer: Medicare HMO | Attending: Pulmonary Disease | Admitting: Pulmonary Disease

## 2013-12-14 ENCOUNTER — Encounter (HOSPITAL_COMMUNITY): Payer: Self-pay | Admitting: Emergency Medicine

## 2013-12-14 ENCOUNTER — Emergency Department: Payer: Self-pay

## 2013-12-14 DIAGNOSIS — G9341 Metabolic encephalopathy: Secondary | ICD-10-CM | POA: Diagnosis present

## 2013-12-14 DIAGNOSIS — Z7901 Long term (current) use of anticoagulants: Secondary | ICD-10-CM

## 2013-12-14 DIAGNOSIS — E876 Hypokalemia: Secondary | ICD-10-CM | POA: Diagnosis present

## 2013-12-14 DIAGNOSIS — K5010 Crohn's disease of large intestine without complications: Secondary | ICD-10-CM | POA: Diagnosis present

## 2013-12-14 DIAGNOSIS — Z9089 Acquired absence of other organs: Secondary | ICD-10-CM

## 2013-12-14 DIAGNOSIS — R6521 Severe sepsis with septic shock: Secondary | ICD-10-CM

## 2013-12-14 DIAGNOSIS — R509 Fever, unspecified: Secondary | ICD-10-CM

## 2013-12-14 DIAGNOSIS — R6520 Severe sepsis without septic shock: Secondary | ICD-10-CM

## 2013-12-14 DIAGNOSIS — Z6831 Body mass index (BMI) 31.0-31.9, adult: Secondary | ICD-10-CM

## 2013-12-14 DIAGNOSIS — R7881 Bacteremia: Secondary | ICD-10-CM

## 2013-12-14 DIAGNOSIS — I2699 Other pulmonary embolism without acute cor pulmonale: Secondary | ICD-10-CM

## 2013-12-14 DIAGNOSIS — I82409 Acute embolism and thrombosis of unspecified deep veins of unspecified lower extremity: Secondary | ICD-10-CM | POA: Diagnosis present

## 2013-12-14 DIAGNOSIS — N179 Acute kidney failure, unspecified: Secondary | ICD-10-CM | POA: Diagnosis present

## 2013-12-14 DIAGNOSIS — D649 Anemia, unspecified: Secondary | ICD-10-CM | POA: Diagnosis present

## 2013-12-14 DIAGNOSIS — A419 Sepsis, unspecified organism: Secondary | ICD-10-CM

## 2013-12-14 DIAGNOSIS — K5090 Crohn's disease, unspecified, without complications: Secondary | ICD-10-CM

## 2013-12-14 DIAGNOSIS — E872 Acidosis, unspecified: Secondary | ICD-10-CM | POA: Diagnosis present

## 2013-12-14 DIAGNOSIS — Z79899 Other long term (current) drug therapy: Secondary | ICD-10-CM

## 2013-12-14 DIAGNOSIS — J310 Chronic rhinitis: Secondary | ICD-10-CM | POA: Diagnosis present

## 2013-12-14 DIAGNOSIS — IMO0002 Reserved for concepts with insufficient information to code with codable children: Secondary | ICD-10-CM

## 2013-12-14 DIAGNOSIS — A4151 Sepsis due to Escherichia coli [E. coli]: Principal | ICD-10-CM | POA: Diagnosis present

## 2013-12-14 HISTORY — DX: Acute embolism and thrombosis of unspecified deep veins of unspecified lower extremity: I82.409

## 2013-12-14 LAB — CBC
HCT: 35 % — ABNORMAL LOW (ref 39.0–52.0)
Hemoglobin: 11.3 g/dL — ABNORMAL LOW (ref 13.0–17.0)
MCH: 28.6 pg (ref 26.0–34.0)
MCHC: 32.3 g/dL (ref 30.0–36.0)
MCV: 88.6 fL (ref 78.0–100.0)
PLATELETS: 157 10*3/uL (ref 150–400)
RBC: 3.95 MIL/uL — AB (ref 4.22–5.81)
RDW: 14 % (ref 11.5–15.5)
WBC: 9.7 10*3/uL (ref 4.0–10.5)

## 2013-12-14 LAB — PRO B NATRIURETIC PEPTIDE: PRO B NATRI PEPTIDE: 206.2 pg/mL — AB (ref 0–125)

## 2013-12-14 LAB — COMPREHENSIVE METABOLIC PANEL
ALBUMIN: 3.3 g/dL — AB (ref 3.5–5.2)
ALBUMIN: 2 g/dL — AB (ref 3.5–5.2)
ALT: 36 U/L (ref 0–53)
ALT: 54 U/L — ABNORMAL HIGH (ref 0–53)
AST: 50 U/L — AB (ref 0–37)
AST: 110 U/L — AB (ref 0–37)
Alkaline Phosphatase: 92 U/L (ref 39–117)
Alkaline Phosphatase: 65 U/L (ref 39–117)
BILIRUBIN TOTAL: 0.9 mg/dL (ref 0.3–1.2)
BUN: 12 mg/dL (ref 6–23)
BUN: 12 mg/dL (ref 6–23)
CALCIUM: 9.4 mg/dL (ref 8.4–10.5)
CO2: 25 mEq/L (ref 19–32)
CO2: 20 mEq/L (ref 19–32)
CREATININE: 1.34 mg/dL (ref 0.50–1.35)
Calcium: 7.3 mg/dL — ABNORMAL LOW (ref 8.4–10.5)
Chloride: 102 mEq/L (ref 96–112)
Chloride: 108 mEq/L (ref 96–112)
Creatinine, Ser: 1.28 mg/dL (ref 0.50–1.35)
GFR calc Af Amer: 63 mL/min — ABNORMAL LOW (ref >90)
GFR calc Af Amer: 60 mL/min — ABNORMAL LOW (ref >90)
GFR calc non Af Amer: 55 mL/min — ABNORMAL LOW (ref >90)
GFR calc non Af Amer: 52 mL/min — ABNORMAL LOW (ref >90)
Glucose, Bld: 133 mg/dL — ABNORMAL HIGH (ref 70–99)
Glucose, Bld: 116 mg/dL — ABNORMAL HIGH (ref 70–99)
POTASSIUM: 3.7 meq/L (ref 3.7–5.3)
Potassium: 3.9 mEq/L (ref 3.7–5.3)
SODIUM: 145 meq/L (ref 137–147)
Sodium: 143 mEq/L (ref 137–147)
TOTAL PROTEIN: 6.3 g/dL (ref 6.0–8.3)
Total Bilirubin: 0.9 mg/dL (ref 0.3–1.2)
Total Protein: 4.2 g/dL — ABNORMAL LOW (ref 6.0–8.3)

## 2013-12-14 LAB — PROCALCITONIN: Procalcitonin: 1.9 ng/mL

## 2013-12-14 LAB — URINALYSIS, ROUTINE W REFLEX MICROSCOPIC
GLUCOSE, UA: NEGATIVE mg/dL
HGB URINE DIPSTICK: NEGATIVE
Ketones, ur: NEGATIVE mg/dL
Leukocytes, UA: NEGATIVE
Nitrite: NEGATIVE
Protein, ur: NEGATIVE mg/dL
SPECIFIC GRAVITY, URINE: 1.016 (ref 1.005–1.030)
Urobilinogen, UA: 0.2 mg/dL (ref 0.0–1.0)
pH: 5.5 (ref 5.0–8.0)

## 2013-12-14 LAB — BLOOD GAS, ARTERIAL
Acid-base deficit: 2.1 mmol/L — ABNORMAL HIGH (ref 0.0–2.0)
Bicarbonate: 19.7 mEq/L — ABNORMAL LOW (ref 20.0–24.0)
DRAWN BY: 103701
FIO2: 0.21 %
O2 SAT: 93.5 %
Patient temperature: 104
TCO2: 17.3 mmol/L (ref 0–100)
pCO2 arterial: 30.5 mmHg — ABNORMAL LOW (ref 35.0–45.0)
pH, Arterial: 7.44 (ref 7.350–7.450)
pO2, Arterial: 79 mmHg — ABNORMAL LOW (ref 80.0–100.0)

## 2013-12-14 LAB — CORTISOL: Cortisol, Plasma: 17.3 ug/dL

## 2013-12-14 LAB — CBC WITH DIFFERENTIAL/PLATELET
BASOS ABS: 0 10*3/uL (ref 0.0–0.1)
Basophils Relative: 0 % (ref 0–1)
EOS ABS: 0 10*3/uL (ref 0.0–0.7)
EOS PCT: 0 % (ref 0–5)
HCT: 45.1 % (ref 39.0–52.0)
Hemoglobin: 14.3 g/dL (ref 13.0–17.0)
LYMPHS PCT: 7 % — AB (ref 12–46)
Lymphs Abs: 0.4 10*3/uL — ABNORMAL LOW (ref 0.7–4.0)
MCH: 27.9 pg (ref 26.0–34.0)
MCHC: 31.7 g/dL (ref 30.0–36.0)
MCV: 87.9 fL (ref 78.0–100.0)
Monocytes Absolute: 0 10*3/uL — ABNORMAL LOW (ref 0.1–1.0)
Monocytes Relative: 0 % — ABNORMAL LOW (ref 3–12)
Neutro Abs: 5.1 10*3/uL (ref 1.7–7.7)
Neutrophils Relative %: 93 % — ABNORMAL HIGH (ref 43–77)
PLATELETS: 198 10*3/uL (ref 150–400)
RBC: 5.13 MIL/uL (ref 4.22–5.81)
RDW: 13.8 % (ref 11.5–15.5)
WBC: 5.5 10*3/uL (ref 4.0–10.5)

## 2013-12-14 LAB — TYPE AND SCREEN
ABO/RH(D): O POS
ANTIBODY SCREEN: NEGATIVE

## 2013-12-14 LAB — TROPONIN I
Troponin I: <0.3 ng/mL (ref <0.30)
Troponin I: <0.3 ng/mL (ref <0.30)

## 2013-12-14 LAB — I-STAT CG4 LACTIC ACID, ED
Lactic Acid, Venous: 6.45 mmol/L — ABNORMAL HIGH (ref 0.5–2.2)
Lactic Acid, Venous: 5.51 mmol/L — ABNORMAL HIGH (ref 0.5–2.2)

## 2013-12-14 LAB — PROTIME-INR
INR: 1.15 (ref 0.00–1.49)
INR: 1.41 (ref 0.00–1.49)
Prothrombin Time: 14.5 seconds (ref 11.6–15.2)
Prothrombin Time: 16.9 seconds — ABNORMAL HIGH (ref 11.6–15.2)

## 2013-12-14 LAB — MRSA PCR SCREENING: MRSA by PCR: NEGATIVE

## 2013-12-14 LAB — LACTIC ACID, PLASMA
Lactic Acid, Venous: 6.6 mmol/L — ABNORMAL HIGH (ref 0.5–2.2)
Lactic Acid, Venous: 6.3 mmol/L — ABNORMAL HIGH (ref 0.5–2.2)
Lactic Acid, Venous: 5.9 mmol/L — ABNORMAL HIGH (ref 0.5–2.2)

## 2013-12-14 LAB — APTT: APTT: 26 s (ref 24–37)

## 2013-12-14 LAB — MAGNESIUM: Magnesium: 1.4 mg/dL — ABNORMAL LOW (ref 1.5–2.5)

## 2013-12-14 LAB — PHOSPHORUS: Phosphorus: 0.9 mg/dL — CL (ref 2.3–4.6)

## 2013-12-14 LAB — FIBRINOGEN: FIBRINOGEN: 287 mg/dL (ref 204–475)

## 2013-12-14 MED ORDER — IOHEXOL 300 MG/ML  SOLN
50.0000 mL | Freq: Once | INTRAMUSCULAR | Status: AC | PRN
  Administered 2013-12-14: 50 mL via ORAL

## 2013-12-14 MED ORDER — SODIUM CHLORIDE 0.9 % IV BOLUS (SEPSIS)
2000.0000 mL | Freq: Once | INTRAVENOUS | Status: AC
  Administered 2013-12-14: 2000 mL via INTRAVENOUS

## 2013-12-14 MED ORDER — BIOTENE DRY MOUTH MT LIQD
15.0000 mL | Freq: Two times a day (BID) | OROMUCOSAL | Status: DC
  Administered 2013-12-14 – 2013-12-16 (×4): 15 mL via OROMUCOSAL

## 2013-12-14 MED ORDER — CHLORHEXIDINE GLUCONATE 0.12 % MT SOLN
15.0000 mL | Freq: Two times a day (BID) | OROMUCOSAL | Status: DC
  Administered 2013-12-14 – 2013-12-16 (×3): 15 mL via OROMUCOSAL
  Filled 2013-12-14 (×5): qty 15

## 2013-12-14 MED ORDER — POTASSIUM PHOSPHATES 15 MMOLE/5ML IV SOLN
20.0000 mmol | Freq: Once | INTRAVENOUS | Status: AC
  Administered 2013-12-14: 20 mmol via INTRAVENOUS
  Filled 2013-12-14 (×2): qty 6.67

## 2013-12-14 MED ORDER — HEPARIN SODIUM (PORCINE) 5000 UNIT/ML IJ SOLN
5000.0000 [IU] | Freq: Three times a day (TID) | INTRAMUSCULAR | Status: DC
  Administered 2013-12-14 – 2013-12-15 (×2): 5000 [IU] via SUBCUTANEOUS
  Filled 2013-12-14 (×3): qty 1

## 2013-12-14 MED ORDER — SODIUM CHLORIDE 0.9 % IV SOLN
1000.0000 mL | INTRAVENOUS | Status: DC
  Administered 2013-12-14: 1000 mL via INTRAVENOUS

## 2013-12-14 MED ORDER — SODIUM CHLORIDE 0.9 % IV BOLUS (SEPSIS)
1000.0000 mL | INTRAVENOUS | Status: DC | PRN
  Administered 2013-12-14: 1000 mL via INTRAVENOUS

## 2013-12-14 MED ORDER — SODIUM CHLORIDE 0.9 % IV SOLN
INTRAVENOUS | Status: DC
  Administered 2013-12-14: 17:00:00 via INTRAVENOUS
  Administered 2013-12-15: 100 mL/h via INTRAVENOUS
  Administered 2013-12-15 – 2013-12-16 (×3): via INTRAVENOUS

## 2013-12-14 MED ORDER — PIPERACILLIN-TAZOBACTAM 3.375 G IVPB
3.3750 g | Freq: Three times a day (TID) | INTRAVENOUS | Status: DC
  Administered 2013-12-14: 3.375 g via INTRAVENOUS
  Filled 2013-12-14: qty 50

## 2013-12-14 MED ORDER — MAGNESIUM SULFATE 40 MG/ML IJ SOLN
2.0000 g | Freq: Once | INTRAMUSCULAR | Status: AC
  Administered 2013-12-14: 2 g via INTRAVENOUS
  Filled 2013-12-14: qty 50

## 2013-12-14 MED ORDER — VANCOMYCIN HCL IN DEXTROSE 750-5 MG/150ML-% IV SOLN
750.0000 mg | Freq: Two times a day (BID) | INTRAVENOUS | Status: DC
  Administered 2013-12-15: 750 mg via INTRAVENOUS
  Filled 2013-12-14 (×2): qty 150

## 2013-12-14 MED ORDER — VANCOMYCIN HCL 10 G IV SOLR
1250.0000 mg | INTRAVENOUS | Status: AC
  Administered 2013-12-14: 1250 mg via INTRAVENOUS
  Filled 2013-12-14: qty 1250

## 2013-12-14 MED ORDER — NOREPINEPHRINE BITARTRATE 1 MG/ML IV SOLN
2.0000 ug/min | INTRAVENOUS | Status: DC
  Administered 2013-12-14: 2 ug/min via INTRAVENOUS
  Filled 2013-12-14 (×2): qty 4

## 2013-12-14 MED ORDER — PANTOPRAZOLE SODIUM 40 MG IV SOLR
40.0000 mg | Freq: Every day | INTRAVENOUS | Status: DC
Start: 2013-12-14 — End: 2013-12-16
  Administered 2013-12-14 – 2013-12-15 (×2): 40 mg via INTRAVENOUS
  Filled 2013-12-14 (×2): qty 40

## 2013-12-14 MED ORDER — PHENYLEPHRINE HCL 10 MG/ML IJ SOLN
30.0000 ug/min | Freq: Once | INTRAVENOUS | Status: DC
  Filled 2013-12-14: qty 1

## 2013-12-14 MED ORDER — PIPERACILLIN-TAZOBACTAM 3.375 G IVPB 30 MIN
3.3750 g | INTRAVENOUS | Status: AC
  Administered 2013-12-14: 3.375 g via INTRAVENOUS
  Filled 2013-12-14: qty 50

## 2013-12-14 MED ORDER — PIPERACILLIN-TAZOBACTAM 3.375 G IVPB: 3.3750 g | Freq: Once | INTRAVENOUS | Status: DC

## 2013-12-14 MED ORDER — IOHEXOL 300 MG/ML  SOLN
100.0000 mL | Freq: Once | INTRAMUSCULAR | Status: AC | PRN
  Administered 2013-12-14: 100 mL via INTRAVENOUS

## 2013-12-14 MED ORDER — HYDROCORTISONE NA SUCCINATE PF 100 MG IJ SOLR
50.0000 mg | Freq: Four times a day (QID) | INTRAMUSCULAR | Status: DC
  Administered 2013-12-14 – 2013-12-16 (×8): 50 mg via INTRAVENOUS
  Filled 2013-12-14 (×8): qty 2

## 2013-12-14 MED ORDER — PHENYLEPHRINE 200 MCG/ML FOR PRIAPISM / HYPOTENSION
50.0000 ug | Freq: Once | INTRAMUSCULAR | Status: AC
  Administered 2013-12-14: 100 ug via INTRAVENOUS
  Filled 2013-12-14: qty 50

## 2013-12-14 MED ORDER — ACETAMINOPHEN 500 MG PO TABS
1000.0000 mg | ORAL_TABLET | Freq: Once | ORAL | Status: AC
Start: 2013-12-14 — End: 2013-12-14
  Administered 2013-12-14: 1000 mg via ORAL
  Filled 2013-12-14: qty 2

## 2013-12-14 MED ORDER — SODIUM CHLORIDE 0.9 % IV BOLUS (SEPSIS)
30.0000 mL/kg | Freq: Once | INTRAVENOUS | Status: AC
  Administered 2013-12-14: 1000 mL via INTRAVENOUS

## 2013-12-14 NOTE — ED Notes (Signed)
MD at bedside. Critical care PA here to evaluate pt

## 2013-12-14 NOTE — Procedures (Signed)
Central Venous Catheter Insertion Procedure Note Mabry Tift [id number] 1941-06-21  Procedure: Insertion of Central Venous Catheter Indications: Assessment of intravascular volume, Drug and/or fluid administration and Frequent blood sampling  Procedure Details Consent: Risks of procedure as well as the alternatives and risks of each were explained to the (patient/caregiver).  Consent for procedure obtained. Time Out: Verified patient identification, verified procedure, site/side was marked, verified correct patient position, special equipment/implants available, medications/allergies/relevent history reviewed, required imaging and test results available.  Performed  Maximum sterile technique was used including antiseptics, cap, gloves, gown, hand hygiene, mask and sheet. Skin prep: Chlorhexidine; local anesthetic administered A antimicrobial bonded/coated triple lumen catheter was placed in the left internal jugular vein using the Seldinger technique. Ultrasound guidance used.yes Catheter placed to 20 cm. Blood aspirated via all 3 ports and then flushed x 3. Line sutured x 2 and dressing applied.  Evaluation Blood flow good Complications: No apparent complications Patient did tolerate procedure well. Chest X-ray ordered to verify placement.  CXR: pending.  Georgann Housekeeper, ACNP [place] Pulmonology/Critical Care Pager [phone] or [phone]  I was present for procedure.  Chesley Mires, MD [facility] Pulmonary/Critical Care 12/14/2013, 4:24 PM Pager:  [phone] After 3pm call: [phone]

## 2013-12-14 NOTE — ED Notes (Signed)
Family at bedside. 

## 2013-12-14 NOTE — ED Notes (Signed)
Dr. Campos at bedside   

## 2013-12-14 NOTE — Progress Notes (Signed)
ANTIBIOTIC CONSULT NOTE - INITIAL  Pharmacy Consult for Vancomycin & Zosyn Indication: rule out sepsis  Allergies  Allergen Reactions  . Ciprofloxacin     tendons hurt  . Other Hives    States he is allergic to 2 meds for allergies but can't recall name    Patient Measurements: Height: 5' 7"  (170.2 cm) Weight: 177 lb (80.287 kg) IBW/kg (Calculated) : 66.1  Vital Signs: Temp: 103.9 F (39.9 C) (06/09 1421) Temp src: Rectal (06/09 1421) BP: 79/62 mmHg (06/09 1509) Pulse Rate: 106 (06/09 1458) Intake/Output from previous day:   Intake/Output from this shift: Total I/O In: 5250 [I.V.:5250] Out: 100 [Urine:100]  Labs:  Recent Labs  12/14/13 1140  WBC 5.5  HGB 14.3  PLT 198  CREATININE 1.28   Estimated Creatinine Clearance: 53.8 ml/min (by C-G formula based on Cr of 1.28). No results found for this basename: VANCOTROUGH, VANCOPEAK, VANCORANDOM, GENTTROUGH, GENTPEAK, GENTRANDOM, TOBRATROUGH, TOBRAPEAK, TOBRARND, AMIKACINPEAK, AMIKACINTROU, AMIKACIN,  in the last 72 hours   Microbiology: No results found for this or any previous visit (from the past 720 hour(s)).  Medical History: Past Medical History  Diagnosis Date  . Crohn disease   . DVT (deep venous thrombosis)     Medications:  Scheduled:  . heparin  5,000 Units Subcutaneous 3 times per day  . hydrocortisone sod succinate (SOLU-CORTEF) inj  50 mg Intravenous Q6H  . pantoprazole (PROTONIX) IV  40 mg Intravenous QHS   Infusions:  . sodium chloride Stopped (12/14/13 1401)  . sodium chloride    . sodium chloride     PRN: sodium chloride Assessment: 44 yom with recent diagnosis of PE/DVT. Presents to ED x 1 day of fevers, chills, sweats, sob w/o purulent sputum. Zosyn 3.375g IV x1 and Vancomycin 1279m IV x1 given in ED. Pharmacy consulted to dose Vanco and Zosyn for possible sepsis.  Tmax: 103.9 WBCs: wnl Renal: SCr 1.28 CrCl ~ 570mmin  Goal of Therapy:  Vancomycin trough level 15-20  mcg/ml Appropriate antibiotic dosing for renal function; eradication of infection  Plan:  1) Start Zosyn 3.375g IV Q8H Extended Infusion 2) Start Vancomycin 75034mV Q12H Measure antibiotic drug levels at steady state Follow up culture results  LauKizzie FurnishharmD Pager: [phone] 3:16 PM

## 2013-12-14 NOTE — ED Notes (Addendum)
Waiting for CT prior to transport to ICU, Dr. Halford Chessman  aware

## 2013-12-14 NOTE — ED Notes (Signed)
MADE AWARE PAUL NP CRITICAL CARE HERE TO START CENTRAL LINE THEN PT WILL TRANSFER.  EDP CAMPOS TO ADMINISTER MEDICATION TO ASSIST IN ELEVATING BP. CHARGE STACEY WEST TO ASSIST EDP CAMPOS. PT TOLERATED

## 2013-12-14 NOTE — ED Notes (Signed)
MD at bedside. EDP CAMPOS IN TO REEVALUATE PT

## 2013-12-14 NOTE — Progress Notes (Signed)
  CARE MANAGEMENT ED NOTE 12/14/2013  Patient:  Rodney Sullivan, Rodney Sullivan   Account Number:  [id number]  Date Initiated:  12/14/2013  Documentation initiated by:  Jackelyn Poling  Subjective/Objective Assessment:   73 yr old medicare covered [place] pt Dx Sepsis no pcp listed Pt confirms pcp is Dr Virgel Bouquet [facility]     Subjective/Objective Assessment Detail:   Admitted to ICU     Action/Plan:   EPIC updated UR completed   Action/Plan Detail:   Anticipated DC Date:  12/17/2013     Status Recommendation to Physician:   Result of Recommendation:    Other ED [place] - Pt will follow up  Other  PCP issues    Choice offered to / List presented to:            Status of service:  Completed, signed off  ED Comments:   ED Comments Detail:

## 2013-12-14 NOTE — ED Notes (Signed)
Respiratory has been called about ABG

## 2013-12-14 NOTE — ED Provider Notes (Addendum)
CSN: [id number]     Arrival date & time 12/14/13  1137 History   First MD Initiated Contact with Patient 12/14/13 1146     Chief Complaint  Patient presents with  . Shortness of Breath      HPI Patient presents with fever and chills this morning.  Documented fever vomiting 2 at home.  He was recently hospitalized and diagnosed with DVT and pulmonary emboli.  He is on eliquis from the outside hospital.  Wife reports he was in his normal set health yesterday.  He awoke this way this morning.  His been coughing more today.  He reports he feels short of breath.  Denies joint pain.  Denies new rash.  Denies abdominal pain and back pain.  No urinary complaints.  Family reports no significant confusion.  Past Medical History  Diagnosis Date  . Crohn disease   . DVT (deep venous thrombosis)    Past Surgical History  Procedure Laterality Date  . Abdominal surgery    . Colon surgery    . Cholecystectomy    . Joint replacement    . Hip surgery      BOTH  . Shoulder surgery      RIGHT TENDON DETACHED   No family history on file. History  Substance Use Topics  . Smoking status: Never Smoker   . Smokeless tobacco: Never Used  . Alcohol Use: No    Review of Systems  All other systems reviewed and are negative.     Allergies  Ciprofloxacin and Other  Home Medications   Prior to Admission medications   Medication Sig Start Date End Date Taking? Authorizing Provider  allopurinol (ZYLOPRIM) 100 MG tablet Take 100 mg by mouth daily.    Historical Provider, MD  calcium-vitamin D (OSCAL WITH D) 500-200 MG-UNIT per tablet Take 1 tablet by mouth daily.    Historical Provider, MD  Certolizumab Pegol (CIMZIA [place]) Inject 400 mg into the skin every 30 (thirty) days.    Historical Provider, MD  cholecalciferol (VITAMIN D) 1000 UNITS tablet Take 2,000 Units by mouth daily.    Historical Provider, MD  diphenhydrAMINE (BENADRYL) 25 MG tablet Take 25 mg by mouth 2 (two) times daily.    Historical  Provider, MD  mesalamine (PENTASA) 500 MG CR capsule Take 2,000 mg by mouth 2 (two) times daily.    Historical Provider, MD  montelukast (SINGULAIR) 10 MG tablet Take 10 mg by mouth at bedtime.    Historical Provider, MD  Multiple Vitamin (MULTIVITAMIN WITH MINERALS) TABS Take 1 tablet by mouth daily.    Historical Provider, MD  omega-3 acid ethyl esters (LOVAZA) 1 G capsule Take 1 g by mouth 2 (two) times daily.     Historical Provider, MD  predniSONE (DELTASONE) 10 MG tablet Take 10 mg by mouth daily.    Historical Provider, MD  traMADol (ULTRAM) 50 MG tablet Take 50 mg by mouth every 6 (six) hours as needed for pain.    Historical Provider, MD  vitamin B-12 (CYANOCOBALAMIN) 1000 MCG tablet Take 1,000 mcg by mouth daily.    Historical Provider, MD   BP 125/67  Pulse 119  Temp(Src) 98.3 F (36.8 C) (Oral)  Resp 15  SpO2 97% Physical Exam  Nursing note and vitals reviewed. Constitutional: He is oriented to person, place, and time. He appears well-developed and well-nourished.  HENT:  Head: Normocephalic and atraumatic.  Eyes: EOM are normal.  Neck: Normal range of motion.  Cardiovascular: Regular rhythm, normal heart sounds  and intact distal pulses.   Tachycardia  Pulmonary/Chest: Effort normal and breath sounds normal. No respiratory distress.  Rhonchi bilateral bases  Abdominal: Soft. He exhibits no distension. There is no tenderness.  Musculoskeletal: Normal range of motion.  Neurological: He is alert and oriented to person, place, and time.   Sits there with his eyes closed but opens them and responds to verbal stimuli  Skin: Skin is warm and dry.  Psychiatric: He has a normal mood and affect. Judgment normal.    ED Course  Procedures (including critical care time) CRITICAL CARE Performed by: Hoy Morn Total critical care time: 35 Critical care time was exclusive of separately billable procedures and treating other patients. Critical care was necessary to treat or  prevent imminent or life-threatening deterioration. Critical care was time spent personally by me on the following activities: development of treatment plan with patient and/or surrogate as well as nursing, discussions with consultants, evaluation of patient's response to treatment, examination of patient, obtaining history from patient or surrogate, ordering and performing treatments and interventions, ordering and review of laboratory studies, ordering and review of radiographic studies, pulse oximetry and re-evaluation of patient's condition.   Labs Review Labs Reviewed  CBC WITH DIFFERENTIAL - Abnormal; Notable for the following:    Neutrophils Relative % 93 (*)    Lymphocytes Relative 7 (*)    Lymphs Abs 0.4 (*)    Monocytes Relative 0 (*)    Monocytes Absolute 0.0 (*)    All other components within normal limits  COMPREHENSIVE METABOLIC PANEL - Abnormal; Notable for the following:    Glucose, Bld 133 (*)    Albumin 3.3 (*)    AST 50 (*)    GFR calc non Af Amer 55 (*)    GFR calc Af Amer 63 (*)    All other components within normal limits  URINALYSIS, ROUTINE W REFLEX MICROSCOPIC - Abnormal; Notable for the following:    Bilirubin Urine SMALL (*)    All other components within normal limits  LACTIC ACID, PLASMA - Abnormal; Notable for the following:    Lactic Acid, Venous 6.6 (*)    All other components within normal limits  PRO B NATRIURETIC PEPTIDE - Abnormal; Notable for the following:    Pro B Natriuretic peptide (BNP) 206.2 (*)    All other components within normal limits  BLOOD GAS, ARTERIAL - Abnormal; Notable for the following:    pCO2 arterial 30.5 (*)    pO2, Arterial 79.0 (*)    Bicarbonate 19.7 (*)    Acid-base deficit 2.1 (*)    All other components within normal limits  CBC - Abnormal; Notable for the following:    RBC 3.95 (*)    Hemoglobin 11.3 (*)    HCT 35.0 (*)    All other components within normal limits  PROTIME-INR - Abnormal; Notable for the  following:    Prothrombin Time 16.9 (*)    All other components within normal limits  I-STAT CG4 LACTIC ACID, ED - Abnormal; Notable for the following:    Lactic Acid, Venous 6.45 (*)    All other components within normal limits  I-STAT CG4 LACTIC ACID, ED - Abnormal; Notable for the following:    Lactic Acid, Venous 5.51 (*)    All other components within normal limits  CULTURE, BLOOD (ROUTINE X 2)  CULTURE, BLOOD (ROUTINE X 2)  URINE CULTURE  CULTURE, BLOOD (ROUTINE X 2)  CULTURE, BLOOD (ROUTINE X 2)  URINE CULTURE  CULTURE,  EXPECTORATED SPUTUM-ASSESSMENT  TROPONIN I  PROTIME-INR  PROCALCITONIN  APTT  FIBRINOGEN  COMPREHENSIVE METABOLIC PANEL  LACTIC ACID, PLASMA  URINALYSIS, ROUTINE W REFLEX MICROSCOPIC  CORTISOL  TROPONIN I  MAGNESIUM  PHOSPHORUS  STREP PNEUMONIAE URINARY ANTIGEN  LEGIONELLA ANTIGEN, URINE  BLOOD GAS, ARTERIAL  TYPE AND SCREEN    Imaging Review Dg Chest Portable 1 View  12/14/2013   CLINICAL DATA:  Fever and shortness of breath with chest pain and history of recent pulmonary embolism.  EXAM: PORTABLE CHEST - 1 VIEW  COMPARISON:  PA and lateral chest of September 29, 2009  FINDINGS: The lungs are mildly hypoinflated greater on the right than on the left. There is no alveolar infiltrate. The heart is top-normal in size. The pulmonary vascularity is not engorged. There is no pleural effusion. The bony thorax is unremarkable.  IMPRESSION: The study is limited due to hypoinflation. There is no definite acute cardiopulmonary abnormality.   Electronically Signed   By: David  Martinique   On: 12/14/2013 12:31  I personally reviewed the imaging tests through PACS system I reviewed available ER/hospitalization records through the EMR    EKG Interpretation   Date/Time:  Tuesday December 14 2013 11:44:09 EDT Ventricular Rate:  125 PR Interval:  115 QRS Duration: 76 QT Interval:  324 QTC Calculation: 467 R Axis:   -17 Text Interpretation:  Sinus tachycardia Ventricular  premature complex  Borderline left axis deviation Abnormal R-wave progression, late  transition Baseline wander in lead(s) V2 No old tracing to compare  Confirmed by Smokey Melott  MD, Lennette Bihari ([id number]) on 12/14/2013 12:27:28 PM      MDM   Final diagnoses:  None    Level II sepsis code called on arrival.  Mild alteration in mental status, tachycardia, soft blood pressure.  Fever of 102 at home.  Labs, antibiotics, chest x-ray.  12:31 PM Lactate 6.5.  Initial 30 cc per nightly fluid bolus is currently infusing.  We'll continue to monitor closely in the emergency department 1:28 PM BP 92/56. Pt written for 2 more liters IVFs. Will likely require central line placement, ongoing IV bolus and ICU admission. Updated family.   Hoy Morn, MD 12/14/13 1329  3:47 PM Pt is under the care of PCCM at this time. He has developed worsening hypotension. PCCM is placing central line at this time.  Phenylephrine drip.  I gave a total of 300 mcg of phenylephrine pushed at the bedside.  Patient will need an arterial line as well.  Have updated patient and the family.  He still mentating okay.  Overall he states he feels better.  He is just receive Solu-Cortef as well  Hoy Morn, MD 12/14/13 [phone]

## 2013-12-14 NOTE — ED Notes (Signed)
Made patient aware of urine specimen, patient will try to go ina few minutes. Tech also notified wife of sample needed.

## 2013-12-14 NOTE — ED Notes (Addendum)
Pt in by car and helped from car. Pt reports blood clots in lungs and was treated "around [place] day". Reports SOB since this am with intermittent chest discomfort that he reports 2/10. Pt had fever 102 at home and took tylenol.

## 2013-12-14 NOTE — H&P (Signed)
PULMONARY / CRITICAL CARE MEDICINE   Name: Rodney Sullivan MRN: [id number] DOB: 1941/01/05    ADMISSION DATE:  12/14/2013   REFERRING MD :  EDP PRIMARY SERVICE: PCCM  CHIEF COMPLAINT:  Fever, chills , n/v  BRIEF PATIENT DESCRIPTION:   73 yo WM with recent diagnosis of PE/DVT 11/27/13 treated with hospitalization and apixaban. He presents to [facility] 6/9 with one day of fevers, chills, sweats , sob without purulent sputum. He vomited x 2. 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 He was diagnosed with PE in [place] but has not seen a [place] MD since. Note he had fever and sinus infection and PE was a incidental finding. He is a Crohn's disease and has been on prednisone x 15 years .  HPCP/GI MD are located in [facility] [place]. We will admit for further evaluation and treatment. We may need to CT his abd and possibly chest to rule out GI source of sepsis and recurrent PE thru current anticoagulant. We will hold anticoagulant for possible CVL placement in future. Admit to ICU, fluid resuscitation, abx, pan culture and add stress steroids.  SIGNIFICANT EVENTS / STUDIES:    LINES / TUBES:   CULTURES: 6/9 bnc x 2>> 6/9 uc>> 6/9 procal>>  ANTIBIOTICS: 6/9 vanc>> 6/9 pip-tazo>>  HISTORY OF PRESENT ILLNESS:    73 yo WM with recent diagnosis of PE/DVT 11/27/13 treated with hospitalization and apixaban. He presents to [facility] 6/9 with one day of fevers, chills, sweats , sob without purulent sputum. He vomited x 2. 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 He was diagnosed with PE in [place] but has not seen a [place] MD since. Note he had fever and sinus infection and PE was a incidental finding. He is a Crohn's disease and has been on prednisone x 15 years .  HPCP/GI MD are located in [facility] [place]. We will admit for further evaluation and treatment. We may need to CT his abd and possibly chest to rule out GI source of sepsis and recurrent PE  thru current anticoagulant. We will hold anticoagulant for possible CVL placement in future. Admit to ICU, fluid resuscitation, abx, pan culture and add stress steroids. PAST MEDICAL HISTORY :  Past Medical History  Diagnosis Date  . Crohn disease   . DVT (deep venous thrombosis)    Past Surgical History  Procedure Laterality Date  . Abdominal surgery    . Colon surgery    . Cholecystectomy    . Joint replacement    . Hip surgery      BOTH  . Shoulder surgery      RIGHT TENDON DETACHED   Prior to Admission medications   Medication Sig Start Date End Date Taking? Authorizing Provider  acetaminophen (TYLENOL) 500 MG tablet Take 500 mg by mouth every 6 (six) hours as needed for mild pain.   Yes Historical Provider, MD  allopurinol (ZYLOPRIM) 100 MG tablet Take 100 mg by mouth daily.   Yes Historical Provider, MD  apixaban (ELIQUIS) 5 MG TABS tablet Take 5 mg by mouth 2 (two) times daily.   Yes Historical Provider, MD  Certolizumab Pegol (CIMZIA [place]) Inject 400 mg into the skin every 30 (thirty) days.   Yes Historical Provider, MD  cholecalciferol (VITAMIN D) 1000 UNITS tablet Take 2,000 Units by mouth daily.   Yes Historical Provider, MD  diphenhydrAMINE (BENADRYL) 25 MG tablet Take 25 mg by  mouth 2 (two) times daily.   Yes Historical Provider, MD  mesalamine (PENTASA) 500 MG CR capsule Take 2,000 mg by mouth 2 (two) times daily.   Yes Historical Provider, MD  montelukast (SINGULAIR) 10 MG tablet Take 10 mg by mouth at bedtime.   Yes Historical Provider, MD  Multiple Vitamin (MULTIVITAMIN WITH MINERALS) TABS Take 1 tablet by mouth daily.   Yes Historical Provider, MD  omega-3 acid ethyl esters (LOVAZA) 1 G capsule Take 1 g by mouth 2 (two) times daily.    Yes Historical Provider, MD  pantoprazole (PROTONIX) 40 MG tablet Take 40 mg by mouth daily.   Yes Historical Provider, MD  predniSONE (DELTASONE) 5 MG tablet Take 5 mg by mouth 3 (three) times daily.   Yes Historical Provider, MD   traMADol (ULTRAM) 50 MG tablet Take 50 mg by mouth every 6 (six) hours as needed for pain.   Yes Historical Provider, MD  vitamin B-12 (CYANOCOBALAMIN) 1000 MCG tablet Take 5,000 mcg by mouth daily.    Yes Historical Provider, MD   Allergies  Allergen Reactions  . Ciprofloxacin     tendons hurt  . Other Hives    States he is allergic to 2 meds for allergies but can't recall name    FAMILY HISTORY:  History reviewed. No pertinent family history. SOCIAL HISTORY:  reports that he has never smoked. He has never used smokeless tobacco. He reports that he does not drink alcohol or use illicit drugs.  REVIEW OF SYSTEMS:  10 point review of system taken, please see HPI for positives and negatives.   SUBJECTIVE:   VITAL SIGNS: Temp:  [98.3 F (36.8 C)-104 F (40 C)] 103.9 F (39.9 C) (06/09 1421) Pulse Rate:  [113-127] 115 (06/09 1400) Resp:  [10-27] 27 (06/09 1400) BP: (97-125)/(55-88) 102/55 mmHg (06/09 1400) SpO2:  [94 %-98 %] 97 % (06/09 1400) Weight:  [80.287 kg (177 lb)] 80.287 kg (177 lb) (06/09 1238) HEMODYNAMICS:   VENTILATOR SETTINGS:   INTAKE / OUTPUT: Intake/Output     06/08 0701 - 06/09 0700 06/09 0701 - 06/10 0700   I.V. (mL/kg)  1000 (12.5)   Total Intake(mL/kg)  1000 (12.5)   Urine (mL/kg/hr)  100   Total Output   100   Net   +900          PHYSICAL EXAMINATION: General:  wnwdwm nad Neuro: iNTACT HEENT: No jvd/lan Cardiovascular:  HSR RRR Lungs:  diminished in bases Abdomen: on tender +bs Musculoskeletal:  intact Skin:  warm  LABS:  CBC  Recent Labs Lab 12/14/13 1140  WBC 5.5  HGB 14.3  HCT 45.1  PLT 198   Coag's  Recent Labs Lab 12/14/13 1140  INR 1.15   BMET  Recent Labs Lab 12/14/13 1140  NA 145  K 3.9  CL 102  CO2 25  BUN 12  CREATININE 1.28  GLUCOSE 133*   Electrolytes  Recent Labs Lab 12/14/13 1140  CALCIUM 9.4   Sepsis Markers  Recent Labs Lab 12/14/13 1159 12/14/13 1216 12/14/13 1352  LATICACIDVEN  6.6* 6.45* 5.51*  PROCALCITON 1.90  --   --    ABG  Recent Labs Lab 12/14/13 1255  PHART 7.440  PCO2ART 30.5*  PO2ART 79.0*   Liver Enzymes  Recent Labs Lab 12/14/13 1140  AST 50*  ALT 36  ALKPHOS 92  BILITOT 0.9  ALBUMIN 3.3*   Cardiac Enzymes  Recent Labs Lab 12/14/13 1140  TROPONINI <0.30  PROBNP 206.2*   Glucose No  results found for this basename: GLUCAP,  in the last 168 hours  Imaging Dg Chest Portable 1 View  12/14/2013   CLINICAL DATA:  Fever and shortness of breath with chest pain and history of recent pulmonary embolism.  EXAM: PORTABLE CHEST - 1 VIEW  COMPARISON:  PA and lateral chest of September 29, 2009  FINDINGS: The lungs are mildly hypoinflated greater on the right than on the left. There is no alveolar infiltrate. The heart is top-normal in size. The pulmonary vascularity is not engorged. There is no pleural effusion. The bony thorax is unremarkable.  IMPRESSION: The study is limited due to hypoinflation. There is no definite acute cardiopulmonary abnormality.   Electronically Signed   By: David  Martinique   On: 12/14/2013 12:31       ASSESSMENT / PLAN:  PULMONARY A: SOB P:   O2 as needed ICU admit ? Repeat ct for PE evaluation with recent large central PE  CARDIOVASCULAR A: Presumed septic shock P:  Sepsis protocol Check cardiac enzymes  RENAL A:  Mild renal insuff      Elevated lactic acid P:   Hydration Serial labs  GASTROINTESTINAL A:  Crohn's disease  P:   CT abd Solucortef NPO  HEMATOLOGIC A:  Chronic anticoagulation P:  Hold anticoagulation x 24 hrs  INFECTIOUS A:  Presumed sepsis from abd source. P:   See flows  ENDOCRINE A:  Chronic steroid use  P:   Stress steroids  NEUROLOGIC A:  Mild confusion P:   RASS goal: 0 Monitor in ICU  TODAY'S SUMMARY:  73 yo WM with recent diagnosis of PE/DVT 11/27/13 treated with hospitalization and apixaban. He presents to [facility] 6/9 with one day of fevers, chills, sweats , sob  without purulent sputum. He vomited x 2. 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 He was diagnosed with PE in [place] but has not seen a [place] MD since. Note he had fever and sinus infection and PE was a incidental finding. He is a Crohn's disease and has been on prednisone x 15 years .  HPCP/GI MD are located in [facility] [place]. We will admit for further evaluation and treatment. We may need to CT his abd and possibly chest to rule out GI source of sepsis and recurrent PE thru current anticoagulant. We will hold anticoagulant for possible CVL placement in future. Admit to ICU, fluid resuscitation, abx, pan culture and add stress steroids.     Richardson Landry Minor ACNP Maryanna Shape PCCM Pager [phone] till 3 pm If no answer page [phone] 12/14/2013, 2:56 PM   Reviewed above, examined, and agree.  73 yo male with hx of Crohn's colitis developed PE and fever with possible infection (?source) while visiting family in [place] in May.  Was started on anticoagulation and completed Abx.  He returned to [place] two days prior to admit.  He has been feeling more thirsty, and had episode of vomiting this AM (no hematemesis).  He has frequent diarrhea, but no worse than usual.  He c/o bloating, but denies abdominal pain.  He developed recurrent fever, and fatigue this AM.  In ER he was found to have hypotension refractory to IV fluids, and fever 104F.  CXR and u/a unremarkable.  Family concerned about swelling.  Will continue IV Abx with vanc/zosyn, check CT abd/pelvis >> depending on results will decide if he needs surgery and/or GI consult.  Updated family at bedside about plan.  CC time 50  minutes.  Chesley Mires, MD [facility] Pulmonary/Critical Care 12/14/2013, 4:31 PM Pager:  [phone] After 3pm call: [phone]

## 2013-12-15 DIAGNOSIS — B9689 Other specified bacterial agents as the cause of diseases classified elsewhere: Secondary | ICD-10-CM

## 2013-12-15 DIAGNOSIS — A419 Sepsis, unspecified organism: Secondary | ICD-10-CM

## 2013-12-15 DIAGNOSIS — R7881 Bacteremia: Secondary | ICD-10-CM

## 2013-12-15 LAB — BASIC METABOLIC PANEL
BUN: 14 mg/dL (ref 6–23)
CO2: 17 meq/L — AB (ref 19–32)
CREATININE: 1.27 mg/dL (ref 0.50–1.35)
Calcium: 6.7 mg/dL — ABNORMAL LOW (ref 8.4–10.5)
Chloride: 110 mEq/L (ref 96–112)
GFR calc Af Amer: 64 mL/min — ABNORMAL LOW (ref >90)
GFR calc non Af Amer: 55 mL/min — ABNORMAL LOW (ref >90)
Glucose, Bld: 168 mg/dL — ABNORMAL HIGH (ref 70–99)
Potassium: 4.1 mEq/L (ref 3.7–5.3)
Sodium: 144 mEq/L (ref 137–147)

## 2013-12-15 LAB — CBC
HEMATOCRIT: 34 % — AB (ref 39.0–52.0)
Hemoglobin: 10.7 g/dL — ABNORMAL LOW (ref 13.0–17.0)
MCH: 28.2 pg (ref 26.0–34.0)
MCHC: 31.5 g/dL (ref 30.0–36.0)
MCV: 89.5 fL (ref 78.0–100.0)
Platelets: 167 10*3/uL (ref 150–400)
RBC: 3.8 MIL/uL — AB (ref 4.22–5.81)
RDW: 14.5 % (ref 11.5–15.5)
WBC: 25.6 10*3/uL — AB (ref 4.0–10.5)

## 2013-12-15 LAB — MAGNESIUM: Magnesium: 1.4 mg/dL — ABNORMAL LOW (ref 1.5–2.5)

## 2013-12-15 LAB — LACTIC ACID, PLASMA: Lactic Acid, Venous: 4.3 mmol/L — ABNORMAL HIGH (ref 0.5–2.2)

## 2013-12-15 LAB — URINE CULTURE

## 2013-12-15 LAB — URINALYSIS, ROUTINE W REFLEX MICROSCOPIC
Glucose, UA: NEGATIVE mg/dL
HGB URINE DIPSTICK: NEGATIVE
Ketones, ur: NEGATIVE mg/dL
Leukocytes, UA: NEGATIVE
Nitrite: NEGATIVE
PH: 5.5 (ref 5.0–8.0)
PROTEIN: NEGATIVE mg/dL
Specific Gravity, Urine: 1.042 — ABNORMAL HIGH (ref 1.005–1.030)
Urobilinogen, UA: 1 mg/dL (ref 0.0–1.0)

## 2013-12-15 LAB — PHOSPHORUS: Phosphorus: 4.1 mg/dL (ref 2.3–4.6)

## 2013-12-15 LAB — HEPARIN LEVEL (UNFRACTIONATED): HEPARIN UNFRACTIONATED: 0.49 [IU]/mL (ref 0.30–0.70)

## 2013-12-15 MED ORDER — HEPARIN (PORCINE) IN NACL 100-0.45 UNIT/ML-% IJ SOLN
1300.0000 [IU]/h | INTRAMUSCULAR | Status: DC
  Administered 2013-12-15 – 2013-12-16 (×3): 1300 [IU]/h via INTRAVENOUS
  Filled 2013-12-15 (×4): qty 250

## 2013-12-15 MED ORDER — MAGNESIUM SULFATE 40 MG/ML IJ SOLN
2.0000 g | Freq: Once | INTRAMUSCULAR | Status: AC
Start: 2013-12-15 — End: 2013-12-15
  Administered 2013-12-15: 2 g via INTRAVENOUS
  Filled 2013-12-15: qty 50

## 2013-12-15 MED ORDER — PIPERACILLIN-TAZOBACTAM 3.375 G IVPB
3.3750 g | Freq: Three times a day (TID) | INTRAVENOUS | Status: DC
  Administered 2013-12-15 – 2013-12-17 (×6): 3.375 g via INTRAVENOUS
  Filled 2013-12-15 (×7): qty 50

## 2013-12-15 MED ORDER — NOREPINEPHRINE BITARTRATE 1 MG/ML IV SOLN
2.0000 ug/min | INTRAVENOUS | Status: DC
  Filled 2013-12-15: qty 4

## 2013-12-15 MED ORDER — BOOST / RESOURCE BREEZE PO LIQD
1.0000 | Freq: Three times a day (TID) | ORAL | Status: DC
  Administered 2013-12-15: 1 via ORAL

## 2013-12-15 NOTE — Progress Notes (Signed)
ANTICOAGULATION CONSULT NOTE - Initial Consult  Pharmacy Consult for heparin Indication: Recent PE/DVT  Allergies  Allergen Reactions  . Ciprofloxacin     tendons hurt  . Other Hives    States he is allergic to 2 meds for allergies but can't recall name    Patient Measurements: Height: 5' 7"  (170.2 cm) Weight: 177 lb (80.287 kg) IBW/kg (Calculated) : 66.1 Heparin Dosing Weight: 80kg  Vital Signs: Temp: 99.1 F (37.3 C) (06/10 0400) Temp src: Oral (06/10 0400) BP: 109/58 mmHg (06/10 0700) Pulse Rate: 84 (06/10 0700)  Labs:  Recent Labs  12/14/13 1140 12/14/13 1511 12/15/13 0450  HGB 14.3 11.3* 10.7*  HCT 45.1 35.0* 34.0*  PLT 198 157 167  APTT  --  26  --   LABPROT 14.5 16.9*  --   INR 1.15 1.41  --   CREATININE 1.28 1.34 1.27  TROPONINI <0.30 <0.30  --     Estimated Creatinine Clearance: 54.2 ml/min (by C-G formula based on Cr of 1.27).   Medical History: Past Medical History  Diagnosis Date  . Crohn disease   . DVT (deep venous thrombosis)    Assessment: 81 YOM presents with N/V, fever, and chills. Appears he was recently diagnosed with large, central PE and DVT 11/27/13 in [place].  On apixaban prior to admission (last dose 6/8) - this was held x 24h for placement of CVL.  Orders to start heparin gtt 6/10.  CBC: Hgb = 10.7, pltc WNL  Goal of Therapy:  Heparin level 0.3-0.7 units/ml Monitor platelets by anticoagulation protocol: Yes   Plan:   Heparin 1300 units/hr (no bolus as SQ UFH given ~2h ago)  D/C SQ heparin q8h   Check 8h heparin level  Expect to see minimal to little residual effect from apixiban based on last dose 6/8  Daily CBC and heparin level  Doreene Eland, PharmD, BCPS.   Pager: [phone]  12/15/2013,9:44 AM

## 2013-12-15 NOTE — Progress Notes (Addendum)
PULMONARY / CRITICAL CARE MEDICINE   Name: Rodney Sullivan MRN: [id number] DOB: 06/04/1941    ADMISSION DATE:  12/14/2013   REFERRING MD :  EDP PRIMARY SERVICE: PCCM  CHIEF COMPLAINT:  Fever, chills , n/v  BRIEF PATIENT DESCRIPTION:  73 yo male with hx of Crohn's on chronic prednisone was dx with DVT/PE 11/27/13 on trip to [place] >> also tx for infection ?source.  Presented 6/09 with fever, chills, sweats, dyspnea, vomiting from septic shock.   SIGNIFICANT EVENTS: 6/09 Admit  STUDIES:  6/9 ct abd: multiple chronic fistulous tracts near terminal ileum, proximal duodenum  LINES / TUBES: 6/9 left i j cvl>>  CULTURES: 6/9 blood x 2>> GNR >> 6/9 uc>>  ANTIBIOTICS: 6/9 vanc>> 6/10 6/9 pip-tazo>>  SUBJECTIVE:  Remains on low dose pressors. + diarrhea (he has chronic diarrhea)  VITAL SIGNS: Temp:  [97.8 F (36.6 C)-104 F (40 C)] 99.1 F (37.3 C) (06/10 0400) Pulse Rate:  [84-127] 84 (06/10 0700) Resp:  [10-34] 21 (06/10 0700) BP: (70-125)/(37-88) 109/58 mmHg (06/10 0700) SpO2:  [86 %-100 %] 99 % (06/10 0700) Weight:  [80.287 kg (177 lb)] 80.287 kg (177 lb) (06/09 1238) HEMODYNAMICS: CVP:  [10 mmHg-21 mmHg] 12 mmHg INTAKE / OUTPUT: Intake/Output     06/09 0701 - 06/10 0700 06/10 0701 - 06/11 0700   I.V. (mL/kg) 9548.3 (118.9)    IV Piggyback 200    Total Intake(mL/kg) 9748.3 (121.4)    Urine (mL/kg/hr) 810    Stool 450    Total Output 1260     Net +8488.3          Urine Occurrence 1 x    Stool Occurrence 2 x      PHYSICAL EXAMINATION: General:  wnwdwm nad Neuro:Intact HEENT: No jvd/lan, edentulous  Cardiovascular:  HSR RRR Lungs:  diminished in bases Abdomen: on tender +bs +diahhrea  Musculoskeletal:  intact Skin:  warm  LABS:  CBC  Recent Labs Lab 12/14/13 1140 12/14/13 1511 12/15/13 0450  WBC 5.5 9.7 25.6*  HGB 14.3 11.3* 10.7*  HCT 45.1 35.0* 34.0*  PLT 198 157 167   Coag's  Recent Labs Lab 12/14/13 1140 12/14/13 1511  APTT  --  26   INR 1.15 1.41   BMET  Recent Labs Lab 12/14/13 1140 12/14/13 1511 12/15/13 0450  NA 145 143 144  K 3.9 3.7 4.1  CL 102 108 110  CO2 25 20 17*  BUN 12 12 14   CREATININE 1.28 1.34 1.27  GLUCOSE 133* 116* 168*   Electrolytes  Recent Labs Lab 12/14/13 1140 12/14/13 1511 12/15/13 0450  CALCIUM 9.4 7.3* 6.7*  MG  --  1.4* 1.4*  PHOS  --  0.9* 4.1   Sepsis Markers  Recent Labs Lab 12/14/13 1159  12/14/13 1512 12/14/13 1850 12/15/13 0655  LATICACIDVEN 6.6*  < > 6.3* 5.9* 4.3*  PROCALCITON 1.90  --   --   --   --   < > = values in this interval not displayed.  ABG  Recent Labs Lab 12/14/13 1255  PHART 7.440  PCO2ART 30.5*  PO2ART 79.0*   Liver Enzymes  Recent Labs Lab 12/14/13 1140 12/14/13 1511  AST 50* 110*  ALT 36 54*  ALKPHOS 92 65  BILITOT 0.9 0.9  ALBUMIN 3.3* 2.0*   Cardiac Enzymes  Recent Labs Lab 12/14/13 1140 12/14/13 1511  TROPONINI <0.30 <0.30  PROBNP 206.2*  --    Imaging Ct Abdomen Pelvis W Contrast  12/14/2013   CLINICAL DATA:  Fever,  sepsis, abdominal distention. History of Crohn's disease.  EXAM: CT ABDOMEN AND PELVIS WITH CONTRAST  TECHNIQUE: Multidetector CT imaging of the abdomen and pelvis was performed using the standard protocol following bolus administration of intravenous contrast.  CONTRAST:  157m OMNIPAQUE IOHEXOL 300 MG/ML  SOLN  COMPARISON:  Partial comparison CT chest dated 11/27/2013. CT abdomen pelvis dated 07/09/2010.  FINDINGS: Subpleural reticulation/fibrosis at the lung bases with superimposed bilateral lower lobe atelectasis. Trace bilateral pleural effusions. 10 mm short axis right intralobar/lower lobe node (series 8/image 1), grossly unchanged.  Liver is notable for reactive enhancement along the inferior aspect of the gallbladder fossa (series 8/image 27).  Spleen, pancreas, and adrenal glands are within normal limits.  Status post cholecystectomy. No intrahepatic or extrahepatic ductal dilatation.  2.0 x 2.8 cm  posterior right lower pole renal cyst (series 7/ image 24). Additional 1.5 x 1.7 cm lateral right lower pole renal cyst (series 7/ image 20). Two tiny left renal cysts. No hydronephrosis.  Status post partial colectomy. Chronic inflammatory changes/wall thickening at the suspected neoterminal ileum in the right mid abdomen series 8/ image 34). Associated chronic fistulous tracts between the neoterminal ileum, proximal duodenum/duodenal bulb, and adjacent loops of colon in the right mid abdomen (series 8/images 29, 35, and 39).  No drainable fluid collection/ abscess. No evidence of bowel obstruction. Chronic mucosal thickening with submucosal fat involving the rectum.  Atherosclerotic calcifications of the abdominal aorta and branch vessels.  No abdominopelvic ascites.  No suspicious abdominopelvic lymphadenopathy.  Prostate is obscured by streak artifact.  Bladder is unremarkable.  Degenerative changes of the thoracic spine. Bilateral total hip arthroplasties.  IMPRESSION: Status post partial colectomy.  Chronic wall thickening involving the neoterminal ileum. No findings to suggest acute inflammatory Crohn's disease or stricture/small obstruction.  Multiple chronic fistulous tracts between the neoterminal ileum, proximal duodenum, and adjacent loops of colon in the right mid abdomen.  No drainable fluid collection/abscess.  Trace bilateral pleural effusions.   Electronically Signed   By: SJulian HyM.D.   On: 12/14/2013 17:59   Dg Chest Port 1 View  12/14/2013   CLINICAL DATA:  Central line placements  EXAM: PORTABLE CHEST - 1 VIEW  COMPARISON:  Prior chest x-ray 12/14/2013  FINDINGS: New left IJ approach central venous catheter. The catheter tip projects over the upper SVC directed toward the lateral wall. No evidence of pneumothorax or hemothorax. Inspiratory volumes remain low with bibasilar atelectasis. Trace atherosclerotic calcification present in the transverse aorta. No new focal airspace  consolidation or pleural effusion. No acute osseous abnormality.  IMPRESSION: 1. Left IJ approach central venous catheter. Catheter tip projects over the upper SVC. 2. No evidence of complicating pneumothorax or new pleural effusion. 3. No significant interval change in the appearance of the chest with persistent low inspiratory volumes and bibasilar atelectasis.   Electronically Signed   By: HJacqulynn CadetM.D.   On: 12/14/2013 16:35   Dg Chest Portable 1 View  12/14/2013   CLINICAL DATA:  Fever and shortness of breath with chest pain and history of recent pulmonary embolism.  EXAM: PORTABLE CHEST - 1 VIEW  COMPARISON:  PA and lateral chest of September 29, 2009  FINDINGS: The lungs are mildly hypoinflated greater on the right than on the left. There is no alveolar infiltrate. The heart is top-normal in size. The pulmonary vascularity is not engorged. There is no pleural effusion. The bony thorax is unremarkable.  IMPRESSION: The study is limited due to hypoinflation. There is no definite  acute cardiopulmonary abnormality.   Electronically Signed   By: David  Martinique   On: 12/14/2013 12:31    ASSESSMENT / PLAN:  PULMONARY A:  Dyspnea in setting septic shock. Recent dx of PE/DVT. P:   Oxygen to keep SpO2 > 92% Heparin gtt per pharmacy until more stable  CARDIOVASCULAR A:  Septic shock. P:  Pressors to keep MAP > 65 Continue IV fluids  RENAL A:  AKI. Lactic acidosis. P:   Hydration Serial labs  GASTROINTESTINAL A:   Crohn's disease >> no acute findings on CT abdomen. P:   Advance diet 6/10 Continue solu cortef >> transition back to prednisone when more stable  HEMATOLOGIC A:   Anemia of critical illness. Leukocytosis. P:  F/u CBC  INFECTIOUS A:  Septic shock likely from abd source with GNR bacteremia. P:   Day 2 zosyn D/c vancomycin 6/10  ENDOCRINE A:   Chronic steroid use. P:   Stress steroids  NEUROLOGIC A:   Acute encephalopathy 2nd to sepsis >> improved  6/10. P:   Monitor mental status  TODAY'S SUMMARY:  Wean pressors, start heparin drip, ct abd shows chronic changes.  Richardson Landry Minor ACNP Maryanna Shape PCCM Pager [phone] till 3 pm If no answer page [phone] 12/15/2013, 9:33 AM  Reviewed above, examined, and documentation changes made as needed.  CC time 35 minutes.  Chesley Mires, MD [facility] Pulmonary/Critical Care 12/15/2013, 11:03 AM Pager:  [phone] After 3pm call: [phone]

## 2013-12-15 NOTE — Progress Notes (Signed)
ANTICOAGULATION CONSULT NOTE - Follow Up  Pharmacy Consult for heparin Indication: Recent PE/DVT  Allergies  Allergen Reactions  . Ciprofloxacin     tendons hurt  . Other Hives    States he is allergic to 2 meds for allergies but can't recall name    Patient Measurements: Height: 5' 7"  (170.2 cm) Weight: 198 lb 3.1 oz (89.9 kg) IBW/kg (Calculated) : 66.1 Heparin Dosing Weight: 80kg  Vital Signs: Temp: 98.1 F (36.7 C) (06/10 2000) Temp src: Oral (06/10 2000) BP: 102/57 mmHg (06/10 2100) Pulse Rate: 90 (06/10 2100)  Labs:  Recent Labs  12/14/13 1140 12/14/13 1511 12/15/13 0450 12/15/13 2040  HGB 14.3 11.3* 10.7*  --   HCT 45.1 35.0* 34.0*  --   PLT 198 157 167  --   APTT  --  26  --   --   LABPROT 14.5 16.9*  --   --   INR 1.15 1.41  --   --   HEPARINUNFRC  --   --   --  0.49  CREATININE 1.28 1.34 1.27  --   TROPONINI <0.30 <0.30  --   --     Estimated Creatinine Clearance: 57 ml/min (by C-G formula based on Cr of 1.27).   Medical History: Past Medical History  Diagnosis Date  . Crohn disease   . DVT (deep venous thrombosis)     Assessment: 84 YOM presents with N/V, fever, and chills. Appears he was recently diagnosed with large, central PE and DVT 11/27/13 in [place].  On apixaban prior to admission (last dose 6/8) - this was held x 24h for placement of CVL.  Pharmacy consulted to dose IV heparin on 6/10.  First heparin level is therapeutic (0.49) on 1300 units/hr  No bleeding reported per RN  Goal of Therapy:  Heparin level 0.3-0.7 units/ml Monitor platelets by anticoagulation protocol: Yes   Plan:   Continue Heparin 1300 units/hr  Daily CBC and heparin level  Peggyann Juba, PharmD, BCPS Pager: [phone] 12/15/2013,9:23 PM

## 2013-12-15 NOTE — Progress Notes (Signed)
INITIAL NUTRITION ASSESSMENT  DOCUMENTATION CODES Per approved criteria  -Non-severe (moderate) malnutrition in the context of acute illness or injury   INTERVENTION: Resource Breeze po TID, each supplement provides 250 kcal and 9 grams of protein RD to monitor  NUTRITION DIAGNOSIS: Inadequate oral intake related to medical status as evidenced by clear liquid diet.   Goal: Diet advancement with intake of meals and supplements to meet >90% estimated needs.  Monitor:  Diet tolerance and advancement, labs, weight trend.  Reason for Assessment: Consult for assessment of status and needs.  73 y.o. male  Admitting Dx: <principal problem not specified>  ASESSMENT:  73 yo WM with recent diagnosis of PE/DVT 11/27/13 treated with hospitalization and apixaban. He presents to [facility] 6/9 with one day of fevers, chills, sweats , sob without purulent sputum. He vomited x 2. 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 He was diagnosed with PE in [place] but has not seen a [place] MD since. Note he had fever and sinus infection and PE was a incidental finding. He is a Crohn's disease and has been on prednisone x 15 years . HPCP/GI MD are located in [place] [facility] [place]. We will admit for further evaluation and treatment. We may need to CT his abd and possibly chest to rule out GI source of sepsis and recurrent PE thru current anticoagulant. We will hold anticoagulant for possible CVL placement in future. Admit to ICU, fluid resuscitation, abx, pan culture and add stress steroids  SIGNIFICANT EVENTS / STUDIES:  6/9 ct abd:  Status post partial colectomy.  Chronic wall thickening involving the neoterminal ileum. No findings  to suggest acute inflammatory Crohn's disease or stricture/small  obstruction.  Multiple chronic fistulous tracts between the neoterminal ileum,  proximal duodenum, and adjacent loops of colon in the right mid  abdomen.  No drainable fluid collection/abscess.   Trace bilateral pleural effusions  6/10:  Patient reports eating well prior to admit.  Decreased appetite over the past 2 weeks since dx with blood clots and hospitalized in [place].   Last solid meal 6/8 pm.  Avoids high fiber foods, nuts, most fruits and vegetables.  Tolerates dairy.  Does not tolerate greasy foods.  5-6 bowel movements daily is usual.  Diarrhea with high fat foods.  Glucose is elevated and patient was told he had borderline DM in the past.  Chronic steroid use secondary to chron's.  UBW 185 lbs 2 weeks ago.  Barrel chested from chronic steroid use.      Patient meets criteria for mil/moderate malnutrition related to acute illness AEB decreased body fat and muscle mass.  Nutrition Focused Physical Exam:  Subcutaneous Fat:  Orbital Region: wnl Upper Arm Region: severe Thoracic and Lumbar Region: wnl  Muscle:  Temple Region: severe Clavicle Bone Region: wnl Clavicle and Acromion Bone Region: wnl Scapular Bone Region: n/a Dorsal Hand: wnl Patellar Region: mild Anterior Thigh Region: normal Posterior Calf Region: normal  Edema: none noted    Height: Ht Readings from Last 1 Encounters:  12/14/13 5' 7"  (1.702 m)    Weight: Wt Readings from Last 1 Encounters:  12/14/13 177 lb (80.287 kg)    Ideal Body Weight: 148 lbs  % Ideal Body Weight: 120  Wt Readings from Last 10 Encounters:  12/14/13 177 lb (80.287 kg)  04/03/12 174 lb (78.926 kg)    Usual Body Weight: 185 lbs 2 weeks ago  % Usual Body Weight: 96  BMI:  Body mass index  is 27.72 kg/(m^2).  Estimated Nutritional Needs: Kcal: 1850-1950  Protein: 95-105 gm Fluid: >/=2L daily  Skin: intact  Diet Order: Clear Liquid  EDUCATION NEEDS: -Education needs addressed   Intake/Output Summary (Last 24 hours) at 12/15/13 1035 Last data filed at 12/15/13 0600  Gross per 24 hour  Intake 9748.32 ml  Output   1260 ml  Net 8488.32 ml    Last BM: 6/9  Labs:   Recent Labs Lab 12/14/13 1140  12/14/13 1511 12/15/13 0450  NA 145 143 144  K 3.9 3.7 4.1  CL 102 108 110  CO2 25 20 17*  BUN 12 12 14   CREATININE 1.28 1.34 1.27  CALCIUM 9.4 7.3* 6.7*  MG  --  1.4* 1.4*  PHOS  --  0.9* 4.1  GLUCOSE 133* 116* 168*    CBG (last 3)  No results found for this basename: GLUCAP,  in the last 72 hours  Scheduled Meds: . antiseptic oral rinse  15 mL Mouth Rinse q12n4p  . chlorhexidine  15 mL Mouth Rinse BID  . hydrocortisone sod succinate (SOLU-CORTEF) inj  50 mg Intravenous Q6H  . pantoprazole (PROTONIX) IV  40 mg Intravenous QHS  . piperacillin-tazobactam (ZOSYN)  IV  3.375 g Intravenous 3 times per day  . vancomycin  750 mg Intravenous Q12H    Continuous Infusions: . sodium chloride 150 mL/hr at 12/15/13 0458  . heparin    . norepinephrine (LEVOPHED) Adult infusion      Past Medical History  Diagnosis Date  . Crohn disease   . DVT (deep venous thrombosis)     Past Surgical History  Procedure Laterality Date  . Abdominal surgery    . Colon surgery    . Cholecystectomy    . Joint replacement    . Hip surgery      BOTH  . Shoulder surgery      RIGHT TENDON DETACHED    Antonieta Iba, RD, LDN Clinical Inpatient Dietitian Pager:  [phone] Weekend and after hours pager:  [phone]

## 2013-12-16 ENCOUNTER — Inpatient Hospital Stay (HOSPITAL_COMMUNITY): Payer: Medicare HMO

## 2013-12-16 DIAGNOSIS — I82409 Acute embolism and thrombosis of unspecified deep veins of unspecified lower extremity: Secondary | ICD-10-CM

## 2013-12-16 LAB — BASIC METABOLIC PANEL
BUN: 12 mg/dL (ref 6–23)
CALCIUM: 7.2 mg/dL — AB (ref 8.4–10.5)
CO2: 19 mEq/L (ref 19–32)
CREATININE: 1.09 mg/dL (ref 0.50–1.35)
Chloride: 111 mEq/L (ref 96–112)
GFR calc non Af Amer: 66 mL/min — ABNORMAL LOW (ref >90)
GFR, EST AFRICAN AMERICAN: 77 mL/min — AB (ref >90)
Glucose, Bld: 136 mg/dL — ABNORMAL HIGH (ref 70–99)
Potassium: 3.3 mEq/L — ABNORMAL LOW (ref 3.7–5.3)
Sodium: 144 mEq/L (ref 137–147)

## 2013-12-16 LAB — CBC
HCT: 31.1 % — ABNORMAL LOW (ref 39.0–52.0)
HEMOGLOBIN: 10.2 g/dL — AB (ref 13.0–17.0)
MCH: 28.3 pg (ref 26.0–34.0)
MCHC: 32.8 g/dL (ref 30.0–36.0)
MCV: 86.4 fL (ref 78.0–100.0)
PLATELETS: 133 10*3/uL — AB (ref 150–400)
RBC: 3.6 MIL/uL — ABNORMAL LOW (ref 4.22–5.81)
RDW: 14.5 % (ref 11.5–15.5)
WBC: 19.5 10*3/uL — ABNORMAL HIGH (ref 4.0–10.5)

## 2013-12-16 LAB — MAGNESIUM: MAGNESIUM: 2.1 mg/dL (ref 1.5–2.5)

## 2013-12-16 LAB — URINE CULTURE
Colony Count: NO GROWTH
Culture: NO GROWTH

## 2013-12-16 LAB — HEPARIN LEVEL (UNFRACTIONATED): Heparin Unfractionated: 0.54 IU/mL (ref 0.30–0.70)

## 2013-12-16 LAB — PHOSPHORUS: PHOSPHORUS: 2.3 mg/dL (ref 2.3–4.6)

## 2013-12-16 MED ORDER — VITAMIN B-12 1000 MCG PO TABS
5000.0000 ug | ORAL_TABLET | Freq: Every day | ORAL | Status: DC
  Administered 2013-12-16 – 2013-12-17 (×2): 5000 ug via ORAL
  Filled 2013-12-16 (×2): qty 5

## 2013-12-16 MED ORDER — ADULT MULTIVITAMIN W/MINERALS CH
1.0000 | ORAL_TABLET | Freq: Every day | ORAL | Status: DC
  Administered 2013-12-16 – 2013-12-17 (×2): 1 via ORAL
  Filled 2013-12-16 (×2): qty 1

## 2013-12-16 MED ORDER — SALINE SPRAY 0.65 % NA SOLN
2.0000 | Freq: Two times a day (BID) | NASAL | Status: DC
  Administered 2013-12-16: 2 via NASAL
  Filled 2013-12-16: qty 44

## 2013-12-16 MED ORDER — DIPHENHYDRAMINE HCL 25 MG PO CAPS
25.0000 mg | ORAL_CAPSULE | Freq: Once | ORAL | Status: AC
  Administered 2013-12-16: 25 mg via ORAL
  Filled 2013-12-16: qty 1

## 2013-12-16 MED ORDER — FUROSEMIDE 10 MG/ML IJ SOLN
40.0000 mg | Freq: Once | INTRAMUSCULAR | Status: AC
  Administered 2013-12-16: 40 mg via INTRAVENOUS
  Filled 2013-12-16: qty 4

## 2013-12-16 MED ORDER — OMEGA-3-ACID ETHYL ESTERS 1 G PO CAPS
1.0000 g | ORAL_CAPSULE | Freq: Two times a day (BID) | ORAL | Status: DC
  Administered 2013-12-16 – 2013-12-17 (×3): 1 g via ORAL
  Filled 2013-12-16 (×4): qty 1

## 2013-12-16 MED ORDER — ALLOPURINOL 100 MG PO TABS
100.0000 mg | ORAL_TABLET | Freq: Every day | ORAL | Status: DC
  Administered 2013-12-16 – 2013-12-17 (×2): 100 mg via ORAL
  Filled 2013-12-16 (×2): qty 1

## 2013-12-16 MED ORDER — PANTOPRAZOLE SODIUM 40 MG PO TBEC
40.0000 mg | DELAYED_RELEASE_TABLET | Freq: Every day | ORAL | Status: DC
  Administered 2013-12-16 – 2013-12-17 (×2): 40 mg via ORAL
  Filled 2013-12-16 (×2): qty 1

## 2013-12-16 MED ORDER — ACETAMINOPHEN 325 MG PO TABS
650.0000 mg | ORAL_TABLET | Freq: Once | ORAL | Status: AC
  Administered 2013-12-16: 650 mg via ORAL
  Filled 2013-12-16: qty 2

## 2013-12-16 MED ORDER — TRAMADOL HCL 50 MG PO TABS: 50.0000 mg | ORAL_TABLET | Freq: Four times a day (QID) | ORAL | Status: DC | PRN

## 2013-12-16 MED ORDER — POTASSIUM CHLORIDE CRYS ER 20 MEQ PO TBCR: 40.0000 meq | EXTENDED_RELEASE_TABLET | Freq: Once | ORAL | Status: DC

## 2013-12-16 MED ORDER — MESALAMINE ER 250 MG PO CPCR
2000.0000 mg | ORAL_CAPSULE | Freq: Two times a day (BID) | ORAL | Status: DC
  Administered 2013-12-16 – 2013-12-17 (×3): 2000 mg via ORAL
  Filled 2013-12-16 (×4): qty 8

## 2013-12-16 MED ORDER — POTASSIUM CHLORIDE CRYS ER 20 MEQ PO TBCR
40.0000 meq | EXTENDED_RELEASE_TABLET | Freq: Once | ORAL | Status: AC
  Administered 2013-12-16: 40 meq via ORAL
  Filled 2013-12-16: qty 2

## 2013-12-16 MED ORDER — MONTELUKAST SODIUM 10 MG PO TABS
10.0000 mg | ORAL_TABLET | Freq: Every day | ORAL | Status: DC
  Administered 2013-12-16: 10 mg via ORAL
  Filled 2013-12-16 (×2): qty 1

## 2013-12-16 MED ORDER — FLUTICASONE PROPIONATE 50 MCG/ACT NA SUSP
2.0000 | Freq: Every day | NASAL | Status: DC
  Administered 2013-12-17: 2 via NASAL
  Filled 2013-12-16: qty 16

## 2013-12-16 MED ORDER — PREDNISONE 5 MG PO TABS
5.0000 mg | ORAL_TABLET | Freq: Three times a day (TID) | ORAL | Status: DC
  Administered 2013-12-16 – 2013-12-17 (×4): 5 mg via ORAL
  Filled 2013-12-16 (×6): qty 1

## 2013-12-16 NOTE — Progress Notes (Signed)
[facility] Progress Note Patient Name: Ilija Maxim DOB: Mar 02, 1941 MRN: [id number]  Date of Service  12/16/2013   HPI/Events of Note  Insomnia. Wants his night tylenol pm   eICU Interventions  Benadryl 13m x 1 Tylenol 6574m X 1   Intervention Category Minor Interventions: Other:  Kingsly Kloepfer 12/16/2013, 1:39 AM

## 2013-12-16 NOTE — Progress Notes (Signed)
Pt oob walking in the halls w/ wife.  No SOB noted.  Pt states he can not take Flonase nasal spray.

## 2013-12-16 NOTE — Progress Notes (Addendum)
ANTICOAGULATION CONSULT NOTE - Follow Up  Pharmacy Consult for heparin Indication: Recent PE/DVT  Allergies  Allergen Reactions  . Ciprofloxacin     tendons hurt  . Other Hives    States he is allergic to 2 meds for allergies but can't recall name    Patient Measurements: Height: 5' 7"  (170.2 cm) Weight: 200 lb 6.4 oz (90.9 kg) IBW/kg (Calculated) : 66.1 Heparin Dosing Weight: 80kg  Vital Signs: Temp: 98.2 F (36.8 C) (06/11 0400) Temp src: Oral (06/11 0400) BP: 105/60 mmHg (06/11 0200) Pulse Rate: 84 (06/11 0200)  Labs:  Recent Labs  12/14/13 1140 12/14/13 1511 12/15/13 0450 12/15/13 2040 12/16/13 0410  HGB 14.3 11.3* 10.7*  --  10.2*  HCT 45.1 35.0* 34.0*  --  31.1*  PLT 198 157 167  --  133*  APTT  --  26  --   --   --   LABPROT 14.5 16.9*  --   --   --   INR 1.15 1.41  --   --   --   HEPARINUNFRC  --   --   --  0.49 0.54  CREATININE 1.28 1.34 1.27  --  1.09  TROPONINI <0.30 <0.30  --   --   --     Estimated Creatinine Clearance: 66.8 ml/min (by C-G formula based on Cr of 1.09).   Medical History: Past Medical History  Diagnosis Date  . Crohn disease   . DVT (deep venous thrombosis)     Assessment: 28 YOM presents with N/V, fever, and chills. Appears he was recently diagnosed with large, central PE and DVT 11/27/13 in [place].  On apixaban prior to admission (last dose 6/8) - this was held x 24h for placement of CVL.  Pharmacy consulted to dose IV heparin on 6/10.  Confirmatory (2nd) heparin level is therapeutic (0.54) on 1300 units/hr  Hgb decreased/stable, platelets decreased to 133 (= 198 at admit but likely falsely elevated d/t hemoconcentration).  ? Sepsis contributing to thrombocytopenia  (HIT type II unlikely d/t timing of pltc decrease)  No bleeding reported per RN  Goal of Therapy:  Heparin level 0.3-0.7 units/ml Monitor platelets by anticoagulation protocol: Yes   Plan:   Continue Heparin 1300 units/hr with therapeutic HL x 2  Daily  CBC and heparin level  Monitor CBC  Doreene Eland, PharmD, BCPS.   Pager: [phone] 12/16/2013,7:38 AM

## 2013-12-16 NOTE — Progress Notes (Signed)
[facility] Progress Note Patient Name: Dash Cardarelli DOB: 10-14-1940 MRN: [id number]  Date of Service  12/16/2013   HPI/Events of Note  k low,    eICU Interventions  Replace K May need lasix after k given and if distress noted i nfuture   Intervention Category Minor Interventions: Electrolytes abnormality - evaluation and management  Raylene Miyamoto. 12/16/2013, 8:46 PM

## 2013-12-16 NOTE — Progress Notes (Signed)
PULMONARY / CRITICAL CARE MEDICINE   Name: Rodney Sullivan MRN: [id number] DOB: 26-Apr-1941    ADMISSION DATE:  12/14/2013  REFERRING MD :  EDP  CHIEF COMPLAINT:  Fever, chills , n/v  BRIEF PATIENT DESCRIPTION:  73 yo male with hx of Crohn's on chronic prednisone was dx with DVT/PE 11/27/13 on trip to [place] >> also tx for infection ?source.  Presented 6/09 with fever, chills, sweats, dyspnea, vomiting from septic shock.   SIGNIFICANT EVENTS: 6/09 Admit 6/10 Off pressors 6/11 Transfer to Flr  STUDIES:  6/9 ct abd: multiple chronic fistulous tracts near terminal ileum, proximal duodenum  LINES / TUBES: 6/9 left i j cvl>>  CULTURES: 6/9 blood x 2>> GNR >> 6/9 uc>>  ANTIBIOTICS: 6/9 vanc>> 6/10 6/9 pip-tazo>>  SUBJECTIVE:  Feels better.  C/o nasal congestion.  Had trouble sleeping.  VITAL SIGNS: Temp:  [97.7 F (36.5 C)-98.7 F (37.1 C)] 97.9 F (36.6 C) (06/11 0800) Pulse Rate:  [42-98] 86 (06/11 0800) Resp:  [5-23] 19 (06/11 0800) BP: (92-135)/(52-99) 135/70 mmHg (06/11 0800) SpO2:  [92 %-100 %] 98 % (06/11 0800) Weight:  [200 lb 6.4 oz (90.9 kg)] 200 lb 6.4 oz (90.9 kg) (06/11 0500) HEMODYNAMICS: CVP:  [16 mmHg] 16 mmHg INTAKE / OUTPUT: Intake/Output     06/10 0701 - 06/11 0700 06/11 0701 - 06/12 0700   P.O. 150    I.V. (mL/kg) 2546.3 (28) 678 (7.5)   IV Piggyback 150    Total Intake(mL/kg) 2846.3 (31.3) 678 (7.5)   Urine (mL/kg/hr) 901 (0.4)    Stool 251 (0.1)    Total Output 1152     Net +1694.3 +678        Urine Occurrence 1 x    Stool Occurrence 3 x      PHYSICAL EXAMINATION: General: no distress Neuro: normal strength HEENT: no sinus tenderness Cardiovascular: regular Lungs: no wheeze Abdomen: soft, non tender Musculoskeletal: no edema Skin: no rashes  LABS:  CBC  Recent Labs Lab 12/14/13 1511 12/15/13 0450 12/16/13 0410  WBC 9.7 25.6* 19.5*  HGB 11.3* 10.7* 10.2*  HCT 35.0* 34.0* 31.1*  PLT 157 167 133*   Coag's  Recent  Labs Lab 12/14/13 1140 12/14/13 1511  APTT  --  26  INR 1.15 1.41   BMET  Recent Labs Lab 12/14/13 1511 12/15/13 0450 12/16/13 0410  NA 143 144 144  K 3.7 4.1 3.3*  CL 108 110 111  CO2 20 17* 19  BUN 12 14 12   CREATININE 1.34 1.27 1.09  GLUCOSE 116* 168* 136*   Electrolytes  Recent Labs Lab 12/14/13 1511 12/15/13 0450 12/16/13 0410  CALCIUM 7.3* 6.7* 7.2*  MG 1.4* 1.4* 2.1  PHOS 0.9* 4.1 2.3   Sepsis Markers  Recent Labs Lab 12/14/13 1159  12/14/13 1512 12/14/13 1850 12/15/13 0655  LATICACIDVEN 6.6*  < > 6.3* 5.9* 4.3*  PROCALCITON 1.90  --   --   --   --   < > = values in this interval not displayed.  ABG  Recent Labs Lab 12/14/13 1255  PHART 7.440  PCO2ART 30.5*  PO2ART 79.0*   Liver Enzymes  Recent Labs Lab 12/14/13 1140 12/14/13 1511  AST 50* 110*  ALT 36 54*  ALKPHOS 92 65  BILITOT 0.9 0.9  ALBUMIN 3.3* 2.0*   Cardiac Enzymes  Recent Labs Lab 12/14/13 1140 12/14/13 1511  TROPONINI <0.30 <0.30  PROBNP 206.2*  --    Imaging Ct Abdomen Pelvis W Contrast  12/14/2013   CLINICAL  DATA:  Fever, sepsis, abdominal distention. History of Crohn's disease.  EXAM: CT ABDOMEN AND PELVIS WITH CONTRAST  TECHNIQUE: Multidetector CT imaging of the abdomen and pelvis was performed using the standard protocol following bolus administration of intravenous contrast.  CONTRAST:  126m OMNIPAQUE IOHEXOL 300 MG/ML  SOLN  COMPARISON:  Partial comparison CT chest dated 11/27/2013. CT abdomen pelvis dated 07/09/2010.  FINDINGS: Subpleural reticulation/fibrosis at the lung bases with superimposed bilateral lower lobe atelectasis. Trace bilateral pleural effusions. 10 mm short axis right intralobar/lower lobe node (series 8/image 1), grossly unchanged.  Liver is notable for reactive enhancement along the inferior aspect of the gallbladder fossa (series 8/image 27).  Spleen, pancreas, and adrenal glands are within normal limits.  Status post cholecystectomy. No  intrahepatic or extrahepatic ductal dilatation.  2.0 x 2.8 cm posterior right lower pole renal cyst (series 7/ image 24). Additional 1.5 x 1.7 cm lateral right lower pole renal cyst (series 7/ image 20). Two tiny left renal cysts. No hydronephrosis.  Status post partial colectomy. Chronic inflammatory changes/wall thickening at the suspected neoterminal ileum in the right mid abdomen series 8/ image 34). Associated chronic fistulous tracts between the neoterminal ileum, proximal duodenum/duodenal bulb, and adjacent loops of colon in the right mid abdomen (series 8/images 29, 35, and 39).  No drainable fluid collection/ abscess. No evidence of bowel obstruction. Chronic mucosal thickening with submucosal fat involving the rectum.  Atherosclerotic calcifications of the abdominal aorta and branch vessels.  No abdominopelvic ascites.  No suspicious abdominopelvic lymphadenopathy.  Prostate is obscured by streak artifact.  Bladder is unremarkable.  Degenerative changes of the thoracic spine. Bilateral total hip arthroplasties.  IMPRESSION: Status post partial colectomy.  Chronic wall thickening involving the neoterminal ileum. No findings to suggest acute inflammatory Crohn's disease or stricture/small obstruction.  Multiple chronic fistulous tracts between the neoterminal ileum, proximal duodenum, and adjacent loops of colon in the right mid abdomen.  No drainable fluid collection/abscess.  Trace bilateral pleural effusions.   Electronically Signed   By: SJulian HyM.D.   On: 12/14/2013 17:59   Dg Chest Port 1 View  12/16/2013   CLINICAL DATA:  Atelectasis  EXAM: PORTABLE CHEST - 1 VIEW  COMPARISON:  12/14/2013  FINDINGS: Central venous catheter tip in the SVC is unchanged.  Mild bibasilar atelectasis unchanged. Negative for heart failure or effusion.  IMPRESSION: Mild bibasilar atelectasis unchanged.  No new findings.   Electronically Signed   By: CFranchot GalloM.D.   On: 12/16/2013 07:20   Dg Chest Port 1  View  12/14/2013   CLINICAL DATA:  Central line placements  EXAM: PORTABLE CHEST - 1 VIEW  COMPARISON:  Prior chest x-ray 12/14/2013  FINDINGS: New left IJ approach central venous catheter. The catheter tip projects over the upper SVC directed toward the lateral wall. No evidence of pneumothorax or hemothorax. Inspiratory volumes remain low with bibasilar atelectasis. Trace atherosclerotic calcification present in the transverse aorta. No new focal airspace consolidation or pleural effusion. No acute osseous abnormality.  IMPRESSION: 1. Left IJ approach central venous catheter. Catheter tip projects over the upper SVC. 2. No evidence of complicating pneumothorax or new pleural effusion. 3. No significant interval change in the appearance of the chest with persistent low inspiratory volumes and bibasilar atelectasis.   Electronically Signed   By: HJacqulynn CadetM.D.   On: 12/14/2013 16:35   Dg Chest Portable 1 View  12/14/2013   CLINICAL DATA:  Fever and shortness of breath with chest pain and history  of recent pulmonary embolism.  EXAM: PORTABLE CHEST - 1 VIEW  COMPARISON:  PA and lateral chest of September 29, 2009  FINDINGS: The lungs are mildly hypoinflated greater on the right than on the left. There is no alveolar infiltrate. The heart is top-normal in size. The pulmonary vascularity is not engorged. There is no pleural effusion. The bony thorax is unremarkable.  IMPRESSION: The study is limited due to hypoinflation. There is no definite acute cardiopulmonary abnormality.   Electronically Signed   By: David  Martinique   On: 12/14/2013 12:31    ASSESSMENT / PLAN:  Gram negative bacteremia >> likely GI source. P: Day 3 zosyn  Recent dx of PE/DVT. P:   Continue heparin gtt for now Will ask case manager to assess whether his insurance will cover xarelto  Hx of Crohn's colitis. P: Resume mesalamine, prednisone  Anemia of critical illness. Leukocytosis. P: F/u CBC  Rhinitis. P: Nasal  irrigation, flonase, singulair  Septic shock, AKI, lactic acidosis, acute encephalopathy >> resolved.  D/c central line.  Transfer to non tele flr bed.  Keep on PCCM service >> likely will be ready for d/c home soon.  Chesley Mires, MD [facility] Pulmonary/Critical Care 12/16/2013, 9:15 AM Pager:  [phone] After 3pm call: [phone]

## 2013-12-16 NOTE — Progress Notes (Signed)
Pt c/o of being more SOB. Notified MD, one time if of lasix given IV. Will continue to monitor.

## 2013-12-16 NOTE — Progress Notes (Signed)
Pt BLE edematous. Has mild dypsnea. Contacted MD. He ordered pt on dose of po potassium due to pt level 3.2. Ordered me to call him back is pt's condition worsened. Will continue to monitor.

## 2013-12-16 NOTE — Progress Notes (Addendum)
Still c/o insomnia. Wants to get out of bed  Plan OOB order sent (he is off pressors)  Dr. Brand Males, M.D., [facility].C.P Pulmonary and Critical Care Medicine Staff Physician [place] [facility] Pager: [phone], If no answer or between  15:00h - 7:00h: call [phone]  12/16/2013 3:08 AM

## 2013-12-17 LAB — BASIC METABOLIC PANEL
BUN: 11 mg/dL (ref 6–23)
BUN: 10 mg/dL (ref 6–23)
CALCIUM: 8 mg/dL — AB (ref 8.4–10.5)
CO2: 20 mEq/L (ref 19–32)
CO2: 21 mEq/L (ref 19–32)
CREATININE: 1 mg/dL (ref 0.50–1.35)
Calcium: 8.3 mg/dL — ABNORMAL LOW (ref 8.4–10.5)
Chloride: 110 mEq/L (ref 96–112)
Chloride: 110 mEq/L (ref 96–112)
Creatinine, Ser: 1.06 mg/dL (ref 0.50–1.35)
GFR, EST AFRICAN AMERICAN: 80 mL/min — AB (ref >90)
GFR, EST AFRICAN AMERICAN: 85 mL/min — AB (ref >90)
GFR, EST NON AFRICAN AMERICAN: 69 mL/min — AB (ref >90)
GFR, EST NON AFRICAN AMERICAN: 74 mL/min — AB (ref >90)
GLUCOSE: 145 mg/dL — AB (ref 70–99)
Glucose, Bld: 166 mg/dL — ABNORMAL HIGH (ref 70–99)
POTASSIUM: 3.4 meq/L — AB (ref 3.7–5.3)
Potassium: 3.2 mEq/L — ABNORMAL LOW (ref 3.7–5.3)
SODIUM: 146 meq/L (ref 137–147)
Sodium: 144 mEq/L (ref 137–147)

## 2013-12-17 LAB — CBC
HEMATOCRIT: 34.5 % — AB (ref 39.0–52.0)
Hemoglobin: 11.2 g/dL — ABNORMAL LOW (ref 13.0–17.0)
MCH: 28.1 pg (ref 26.0–34.0)
MCHC: 32.5 g/dL (ref 30.0–36.0)
MCV: 86.7 fL (ref 78.0–100.0)
PLATELETS: 148 10*3/uL — AB (ref 150–400)
RBC: 3.98 MIL/uL — ABNORMAL LOW (ref 4.22–5.81)
RDW: 14.3 % (ref 11.5–15.5)
WBC: 15.5 10*3/uL — AB (ref 4.0–10.5)

## 2013-12-17 LAB — CULTURE, BLOOD (ROUTINE X 2)

## 2013-12-17 LAB — PHOSPHORUS: PHOSPHORUS: 1.4 mg/dL — AB (ref 2.3–4.6)

## 2013-12-17 LAB — MAGNESIUM: Magnesium: 2.2 mg/dL (ref 1.5–2.5)

## 2013-12-17 LAB — HEPARIN LEVEL (UNFRACTIONATED): Heparin Unfractionated: 0.28 IU/mL — ABNORMAL LOW (ref 0.30–0.70)

## 2013-12-17 MED ORDER — POTASSIUM PHOSPHATES 15 MMOLE/5ML IV SOLN
24.0000 mmol | Freq: Once | INTRAVENOUS | Status: AC
  Administered 2013-12-17: 24 mmol via INTRAVENOUS
  Filled 2013-12-17: qty 8

## 2013-12-17 MED ORDER — FLUTICASONE PROPIONATE 50 MCG/ACT NA SUSP: 2.0000 | Freq: Every day | NASAL | Status: DC

## 2013-12-17 MED ORDER — HEPARIN (PORCINE) IN NACL 100-0.45 UNIT/ML-% IJ SOLN
1500.0000 [IU]/h | INTRAMUSCULAR | Status: DC
  Administered 2013-12-17: 1500 [IU]/h via INTRAVENOUS
  Filled 2013-12-17: qty 250

## 2013-12-17 MED ORDER — RIVAROXABAN (XARELTO) VTE STARTER PACK (15 & 20 MG): ORAL_TABLET | ORAL | Status: DC

## 2013-12-17 MED ORDER — CEFDINIR 300 MG PO CAPS: 300.0000 mg | ORAL_CAPSULE | Freq: Two times a day (BID) | ORAL | Status: DC

## 2013-12-17 MED ORDER — RIVAROXABAN 20 MG PO TABS: 20.0000 mg | ORAL_TABLET | Freq: Every day | ORAL | Status: DC

## 2013-12-17 MED ORDER — POTASSIUM CHLORIDE CRYS ER 20 MEQ PO TBCR
40.0000 meq | EXTENDED_RELEASE_TABLET | Freq: Once | ORAL | Status: AC
  Administered 2013-12-17: 40 meq via ORAL
  Filled 2013-12-17: qty 2

## 2013-12-17 MED ORDER — SALINE SPRAY 0.65 % NA SOLN: 2.0000 | Freq: Two times a day (BID) | NASAL | Status: DC

## 2013-12-17 NOTE — Progress Notes (Signed)
Pt states he is breathing easier since he was given the lasix. Pt appears not to be as short of breath when he talks. He is urinating good amount. Will continue to monitor.

## 2013-12-17 NOTE — Progress Notes (Signed)
Patient ID: Rodney Sullivan, male   DOB: 24-Nov-1940, 73 y.o.   MRN: [id number]   eLink Physician Progress Note and Electrolyte Replacement  Patient Name: Rodney Sullivan DOB: 05/06/1941 MRN: [id number]  Date of Service  12/17/2013   HPI/Events of Note    Recent Labs Lab 12/14/13 1140 12/14/13 1511 12/15/13 0450 12/16/13 0410 12/17/13 0035  NA 145 143 144 144 144  K 3.9 3.7 4.1 3.3* 3.4*  CL 102 108 110 111 110  CO2 25 20 17* 19 20  GLUCOSE 133* 116* 168* 136* 166*  BUN 12 12 14 12 11   CREATININE 1.28 1.34 1.27 1.09 1.00  CALCIUM 9.4 7.3* 6.7* 7.2* 8.3*  MG  --  1.4* 1.4* 2.1 2.2  PHOS  --  0.9* 4.1 2.3 1.4*    Estimated Creatinine Clearance: 72.8 ml/min (by C-G formula based on Cr of 1).  Intake/Output     06/11 0701 - 06/12 0700   P.O. 880   I.V. (mL/kg) 1449.3 (15.9)   IV Piggyback 100   Total Intake(mL/kg) 2429.3 (26.7)   Urine (mL/kg/hr) 2550 (1.2)   Total Output 2550   Net -120.7       Stool Occurrence 1 x    - I/O DETAILED x 24h    Total I/O In: 500 [I.V.:400; IV Piggyback:100] Out: 2300 [Urine:2300] - I/O THIS SHIFT    ASSESSMENT Low k Low phos  eICURN Interventions  Replete both   ASSESSMENT: MAJOR ELECTROLYTE      Dr. Brand Males, M.D., [facility].C.P Pulmonary and Critical Care Medicine Staff Physician [place] [facility] Pager: [phone], If no answer or between  15:00h - 7:00h: call [phone]  12/17/2013 4:27 AM

## 2013-12-17 NOTE — Care Management Note (Signed)
    Page 1 of 1   12/17/2013     1:55:09 PM CARE MANAGEMENT NOTE 12/17/2013  Patient:  Rodney Sullivan, Rodney Sullivan   Account Number:  [id number]  Date Initiated:  12/15/2013  Documentation initiated by:  DAVIS,RHONDA  Subjective/Objective Assessment:   sepsis     Action/Plan:   home when stable   Anticipated DC Date:  12/18/2013   Anticipated DC Plan:  [place]  CM consult  Medication Assistance      Choice offered to / List presented to:             Status of service:  Completed, signed off Medicare Important Message given?  NA - LOS <3 / Initial given by admissions (If response is "NO", the following Medicare IM given date fields will be blank) Date Medicare IM given:   Date Additional Medicare IM given:    Discharge Disposition:  HOME/SELF CARE  Per UR Regulation:  Reviewed for med. necessity/level of care/duration of stay  If discussed at [place] of Stay Meetings, dates discussed:    Comments:  12-17-13 Sunday Spillers RN CM 1200 Patient wanted assistance with changing meds from Eliquis to Xarelto. Patient stated the Eliquis was too expensive. Contacted patient's pharmacy at [facility] and they would have to order and copay was $245. Contacted [facility] and they had starter packs with coupon for cost savings. Patient planned on using [facility] pharmacy and coupons. Attending aware, patient for d/c home today, no other needs assessed.  Rhonda Davis,RN,BSN,CCM

## 2013-12-17 NOTE — Discharge Summary (Signed)
Physician Discharge Summary       Patient ID: Rodney Sullivan MRN: [id number] DOB/AGE: 03/13/1941 73 y.o.  Admit date: 12/14/2013 Discharge date: 12/17/2013  Discharge Diagnoses:  Escherichia Coli Bacteremia  Pulmonary emboli/DVT Chron's Colitis  Anemia of critical illness Leukocytosis  Rhinitis  Septic shock (resolved) Acute kidney injury (resolved) Lactic acidosis (resolved) Acute encephalopathy (resolved) Detailed Hospital Course:  73 yo WM with recent diagnosis of PE/DVT 11/27/13 treated with hospitalization and discharged on apixaban. He presented to [facility] 6/9 with one day of fevers, chills, sweats , sob without purulent sputum. He vomited x 2. 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 He was diagnosed with PE in [place] but has not seen a [place] MD since. Note he had fever and sinus infection and PE was a incidental finding. He is a Crohn's disease and has been on prednisone x 15 years . HPCP/GI MD are located in [facility] [place]. He was Admitted to ICU. Culture data was obtained. Therapeutic interventions included: fluid resuscitation, vasoactive support with levophed, empiric antibiotics and stress dose steroids. He was weaned off pressors as of 6/10. Heparin was started 6/10 for the known PE. We had held anticoagulation for central access in order to provide hemodynamic support with pressors. Blood cultures came back initially as GNRs. Antibiotics were narrowed. Transferred to the medical ward on 6/11. Final culture results were reported as E-coli which was pan-sensitive. In regards to his PE heparin gtt was continued during his in-patient stay. Xarelto was started on d/c day.   As of 6/11 he has made remarkable improvement and is hemodynamically stable. He is ready for discharge to home with the following plan of care as outlined below per active problem list.   Discharge Plan by active diagnoses  Gram negative bacteremia ( Pan sensitive Escherichia Coli)    >> likely GI source.  Plan:  Discharge to home with plan for 11 days of cefdinir (which would complete 2 week total treatment)  F/u with PCP   Recent dx of PE/DVT.  Plan:   Home on Xarelto    Hx of Crohn's colitis.  Plan:  Resume mesalamine, prednisone   Anemia of critical illness.  Leukocytosis.  Plan:   F/u CBC at PCPs  Rhinitis.  Plan:   Nasal irrigation,  singulair   Significant Hospital tests/ studies/ interventions and procedures  STUDIES:  6/9 ct abd: multiple chronic fistulous tracts near terminal ileum, proximal duodenum   LINES / TUBES:  6/9 left i j cvl>> 6/11  CULTURES:  6/9 blood x 2>> GNR >> e-coli  6/9 uc>> neg  ANTIBIOTICS:  6/9 vanc>> 6/10  6/9 pip-tazo>> 6/12 omnicef 6/12 (11 more days)   Discharge Exam: BP 125/77  Pulse 74  Temp(Src) 98.1 F (36.7 C) (Oral)  Resp 18  Ht 5' 7"  (1.702 m)  Wt 90.9 kg (200 lb 6.4 oz)  BMI 31.38 kg/m2  SpO2 97%  General: no distress  Neuro: normal strength  HEENT: no sinus tenderness  Cardiovascular: regular  Lungs: no wheeze  Abdomen: soft, non tender  Musculoskeletal: no edema  Skin: no rashes   Labs at discharge Lab Results  Component Value Date   CREATININE 1.06 12/17/2013   BUN 10 12/17/2013   NA 146 12/17/2013   K 3.2* 12/17/2013   CL 110 12/17/2013   CO2 21 12/17/2013   Lab Results  Component Value Date   WBC 15.5* 12/17/2013   HGB 11.2* 12/17/2013   HCT 34.5*  12/17/2013   MCV 86.7 12/17/2013   PLT 148* 12/17/2013   Lab Results  Component Value Date   ALT 54* 12/14/2013   AST 110* 12/14/2013   ALKPHOS 65 12/14/2013   BILITOT 0.9 12/14/2013   Lab Results  Component Value Date   INR 1.41 12/14/2013   INR 1.15 12/14/2013   INR 0.94 09/29/2009    Current radiology studies Dg Chest Port 1 View  12/16/2013   CLINICAL DATA:  Atelectasis  EXAM: PORTABLE CHEST - 1 VIEW  COMPARISON:  12/14/2013  FINDINGS: Central venous catheter tip in the SVC is unchanged.  Mild bibasilar atelectasis unchanged.  Negative for heart failure or effusion.  IMPRESSION: Mild bibasilar atelectasis unchanged.  No new findings.   Electronically Signed   By: Franchot Gallo M.D.   On: 12/16/2013 07:20    Disposition:  01-Home or Self Care      Discharge Instructions   Diet - low sodium heart healthy    Complete by:  As directed      Increase activity slowly    Complete by:  As directed             Medication List    STOP taking these medications       ELIQUIS 5 MG Tabs tablet  Generic drug:  apixaban      TAKE these medications       acetaminophen 500 MG tablet  Commonly known as:  TYLENOL  Take 500 mg by mouth every 6 (six) hours as needed for mild pain.     allopurinol 100 MG tablet  Commonly known as:  ZYLOPRIM  Take 100 mg by mouth daily.     cefdinir 300 MG capsule  Commonly known as:  OMNICEF  Take 1 capsule (300 mg total) by mouth 2 (two) times daily.     cholecalciferol 1000 UNITS tablet  Commonly known as:  VITAMIN D  Take 2,000 Units by mouth daily.     CIMZIA [place]  Inject 400 mg into the skin every 30 (thirty) days.     diphenhydrAMINE 25 MG tablet  Commonly known as:  BENADRYL  Take 25 mg by mouth 2 (two) times daily.     mesalamine 500 MG CR capsule  Commonly known as:  PENTASA  Take 2,000 mg by mouth 2 (two) times daily.     montelukast 10 MG tablet  Commonly known as:  SINGULAIR  Take 10 mg by mouth at bedtime.     multivitamin with minerals Tabs tablet  Take 1 tablet by mouth daily.     omega-3 acid ethyl esters 1 G capsule  Commonly known as:  LOVAZA  Take 1 g by mouth 2 (two) times daily.     pantoprazole 40 MG tablet  Commonly known as:  PROTONIX  Take 40 mg by mouth daily.     predniSONE 5 MG tablet  Commonly known as:  DELTASONE  Take 5 mg by mouth 3 (three) times daily.     rivaroxaban 20 MG Tabs tablet  Commonly known as:  XARELTO  Take 1 tablet (20 mg total) by mouth daily with supper.     Rivaroxaban 15 & 20 MG Tbpk  Commonly known as:   XARELTO STARTER PACK  Take as directed on package: Start with one 8m tablet by mouth twice a day with food. On Day 22, switch to one 261mtablet once a day with food.     sodium chloride 0.65 % Soln nasal spray  Commonly known as:  OCEAN  Place 2 sprays into both nostrils 2 (two) times daily.     traMADol 50 MG tablet  Commonly known as:  ULTRAM  Take 50 mg by mouth every 6 (six) hours as needed for pain.     vitamin B-12 1000 MCG tablet  Commonly known as:  CYANOCOBALAMIN  Take 5,000 mcg by mouth daily.       Follow-up Information   Follow up with [facility] IN SYSTEM In 1 week. (f/u with Dr Ferdinand Lango 7-10 days )       Discharged Condition: good  Physician Statement:   The Patient was personally examined, the discharge assessment and plan has been personally reviewed and I agree with ACNP Babcock's assessment and plan. > 30 minutes of time have been dedicated to discharge assessment, planning and discharge instructions.   SignedMarni Griffon 12/17/2013, 12:41 PM  Chesley Mires, MD [facility] Pulmonary/Critical Care 12/17/2013, 1:42 PM Pager:  [phone] After 3pm call: [phone]

## 2013-12-17 NOTE — Progress Notes (Signed)
ANTICOAGULATION CONSULT NOTE - Follow Up  Pharmacy Consult for heparin Indication: Recent PE/DVT  Allergies  Allergen Reactions  . Ciprofloxacin     tendons hurt  . Other Hives    States he is allergic to 2 meds for allergies but can't recall name    Patient Measurements: Height: 5' 7"  (170.2 cm) Weight: 200 lb 6.4 oz (90.9 kg) IBW/kg (Calculated) : 66.1 Heparin Dosing Weight: 80kg  Vital Signs: Temp: 98.2 F (36.8 C) (06/11 2158) Temp src: Oral (06/11 2158) BP: 123/78 mmHg (06/11 2158) Pulse Rate: 79 (06/11 2158)  Labs:  Recent Labs  12/14/13 1140 12/14/13 1511 12/15/13 0450 12/15/13 2040 12/16/13 0410 12/17/13 0035 12/17/13 0435  HGB 14.3 11.3* 10.7*  --  10.2*  --  11.2*  HCT 45.1 35.0* 34.0*  --  31.1*  --  34.5*  PLT 198 157 167  --  133*  --  148*  APTT  --  26  --   --   --   --   --   LABPROT 14.5 16.9*  --   --   --   --   --   INR 1.15 1.41  --   --   --   --   --   HEPARINUNFRC  --   --   --  0.49 0.54  --  0.28*  CREATININE 1.28 1.34 1.27  --  1.09 1.00 1.06  TROPONINI <0.30 <0.30  --   --   --   --   --     Estimated Creatinine Clearance: 68.7 ml/min (by C-G formula based on Cr of 1.06).   Medical History: Past Medical History  Diagnosis Date  . Crohn disease   . DVT (deep venous thrombosis)     Assessment: 83 YOM presents with N/V, fever, and chills. Appears he was recently diagnosed with large, central PE and DVT 11/27/13 in [place].  On apixaban prior to admission (last dose 6/8) - this was held x 24h for placement of CVL.  Pharmacy consulted to dose IV heparin on 6/10.  Goal of Therapy:  Heparin level 0.3-0.7 units/ml Monitor platelets by anticoagulation protocol: Yes  6/12: Heparin level subtherapeutic on 1300 units/hr RN confirms infusion rate, states no interruptions.  No bleeding reported. H/H/ stable. Pltc improving.  Plan:   Increase heparin to 1500 units/hr  Recheck heparin level in 8 hours.  Clayburn Pert, PharmD,  BCPS Pager: [phone] 12/17/2013  5:27 AM

## 2014-12-08 ENCOUNTER — Emergency Department (HOSPITAL_COMMUNITY): Payer: Medicare HMO

## 2014-12-08 ENCOUNTER — Inpatient Hospital Stay (HOSPITAL_COMMUNITY): Payer: Medicare HMO

## 2014-12-08 ENCOUNTER — Encounter (HOSPITAL_COMMUNITY): Payer: Self-pay | Admitting: Emergency Medicine

## 2014-12-08 ENCOUNTER — Inpatient Hospital Stay (HOSPITAL_COMMUNITY)
Admission: EM | Admit: 2014-12-08 | Discharge: 2014-12-11 | DRG: 871 | Disposition: A | Discharge status: Home / Self Care | Payer: Medicare HMO | Attending: Internal Medicine | Admitting: Internal Medicine

## 2014-12-08 DIAGNOSIS — I959 Hypotension, unspecified: Secondary | ICD-10-CM | POA: Diagnosis present

## 2014-12-08 DIAGNOSIS — Z86711 Personal history of pulmonary embolism: Secondary | ICD-10-CM

## 2014-12-08 DIAGNOSIS — K5090 Crohn's disease, unspecified, without complications: Secondary | ICD-10-CM | POA: Diagnosis present

## 2014-12-08 DIAGNOSIS — Z86718 Personal history of other venous thrombosis and embolism: Secondary | ICD-10-CM | POA: Diagnosis not present

## 2014-12-08 DIAGNOSIS — R600 Localized edema: Secondary | ICD-10-CM | POA: Diagnosis present

## 2014-12-08 DIAGNOSIS — D649 Anemia, unspecified: Secondary | ICD-10-CM | POA: Diagnosis present

## 2014-12-08 DIAGNOSIS — R509 Fever, unspecified: Secondary | ICD-10-CM

## 2014-12-08 DIAGNOSIS — Z7982 Long term (current) use of aspirin: Secondary | ICD-10-CM | POA: Diagnosis not present

## 2014-12-08 DIAGNOSIS — A419 Sepsis, unspecified organism: Principal | ICD-10-CM | POA: Diagnosis present

## 2014-12-08 DIAGNOSIS — E669 Obesity, unspecified: Secondary | ICD-10-CM | POA: Diagnosis present

## 2014-12-08 DIAGNOSIS — E876 Hypokalemia: Secondary | ICD-10-CM | POA: Diagnosis present

## 2014-12-08 DIAGNOSIS — E872 Acidosis: Secondary | ICD-10-CM | POA: Diagnosis present

## 2014-12-08 DIAGNOSIS — R609 Edema, unspecified: Secondary | ICD-10-CM | POA: Diagnosis not present

## 2014-12-08 DIAGNOSIS — R739 Hyperglycemia, unspecified: Secondary | ICD-10-CM | POA: Diagnosis present

## 2014-12-08 DIAGNOSIS — Z79899 Other long term (current) drug therapy: Secondary | ICD-10-CM

## 2014-12-08 DIAGNOSIS — Z7952 Long term (current) use of systemic steroids: Secondary | ICD-10-CM

## 2014-12-08 DIAGNOSIS — Z452 Encounter for adjustment and management of vascular access device: Secondary | ICD-10-CM

## 2014-12-08 DIAGNOSIS — Z9049 Acquired absence of other specified parts of digestive tract: Secondary | ICD-10-CM | POA: Diagnosis present

## 2014-12-08 DIAGNOSIS — R6521 Severe sepsis with septic shock: Secondary | ICD-10-CM | POA: Diagnosis present

## 2014-12-08 DIAGNOSIS — N179 Acute kidney failure, unspecified: Secondary | ICD-10-CM | POA: Diagnosis present

## 2014-12-08 HISTORY — DX: Bacteremia: R78.81

## 2014-12-08 HISTORY — DX: Other pulmonary embolism without acute cor pulmonale: I26.99

## 2014-12-08 LAB — TROPONIN I

## 2014-12-08 LAB — MRSA PCR SCREENING: MRSA by PCR: NEGATIVE

## 2014-12-08 LAB — CBC WITH DIFFERENTIAL/PLATELET
Basophils Absolute: 0 10*3/uL (ref 0.0–0.1)
Basophils Relative: 0 % (ref 0–1)
Eosinophils Absolute: 0.1 10*3/uL (ref 0.0–0.7)
Eosinophils Relative: 1 % (ref 0–5)
HCT: 38.7 % — ABNORMAL LOW (ref 39.0–52.0)
Hemoglobin: 11.9 g/dL — ABNORMAL LOW (ref 13.0–17.0)
Lymphocytes Relative: 5 % — ABNORMAL LOW (ref 12–46)
Lymphs Abs: 0.5 10*3/uL — ABNORMAL LOW (ref 0.7–4.0)
MCH: 25 pg — ABNORMAL LOW (ref 26.0–34.0)
MCHC: 30.7 g/dL (ref 30.0–36.0)
MCV: 81.3 fL (ref 78.0–100.0)
Monocytes Absolute: 0.4 10*3/uL (ref 0.1–1.0)
Monocytes Relative: 4 % (ref 3–12)
Neutro Abs: 9 10*3/uL — ABNORMAL HIGH (ref 1.7–7.7)
Neutrophils Relative %: 90 % — ABNORMAL HIGH (ref 43–77)
Platelets: 176 10*3/uL (ref 150–400)
RBC: 4.76 MIL/uL (ref 4.22–5.81)
RDW: 15.3 % (ref 11.5–15.5)
WBC: 9.9 10*3/uL (ref 4.0–10.5)

## 2014-12-08 LAB — COMPREHENSIVE METABOLIC PANEL
ALT: 47 U/L (ref 17–63)
AST: 74 U/L — ABNORMAL HIGH (ref 15–41)
Albumin: 3.1 g/dL — ABNORMAL LOW (ref 3.5–5.0)
Alkaline Phosphatase: 74 U/L (ref 38–126)
Anion gap: 15 (ref 5–15)
BUN: 15 mg/dL (ref 6–20)
CO2: 20 mmol/L — ABNORMAL LOW (ref 22–32)
Calcium: 8.5 mg/dL — ABNORMAL LOW (ref 8.9–10.3)
Chloride: 103 mmol/L (ref 101–111)
Creatinine, Ser: 1.34 mg/dL — ABNORMAL HIGH (ref 0.61–1.24)
GFR calc Af Amer: 59 mL/min — ABNORMAL LOW (ref >60)
GFR calc non Af Amer: 51 mL/min — ABNORMAL LOW (ref >60)
Glucose, Bld: 196 mg/dL — ABNORMAL HIGH (ref 65–99)
Potassium: 3.5 mmol/L (ref 3.5–5.1)
Sodium: 138 mmol/L (ref 135–145)
Total Bilirubin: 0.7 mg/dL (ref 0.3–1.2)
Total Protein: 5.7 g/dL — ABNORMAL LOW (ref 6.5–8.1)

## 2014-12-08 LAB — URINALYSIS, ROUTINE W REFLEX MICROSCOPIC
Glucose, UA: NEGATIVE mg/dL
Hgb urine dipstick: NEGATIVE
Ketones, ur: NEGATIVE mg/dL
Leukocytes, UA: NEGATIVE
Nitrite: NEGATIVE
Protein, ur: NEGATIVE mg/dL
Specific Gravity, Urine: 1.04 — ABNORMAL HIGH (ref 1.005–1.030)
Urobilinogen, UA: 0.2 mg/dL (ref 0.0–1.0)
pH: 5 (ref 5.0–8.0)

## 2014-12-08 LAB — LACTIC ACID, PLASMA
LACTIC ACID, VENOUS: 6.9 mmol/L — AB (ref 0.5–2.0)
Lactic Acid, Venous: 6.5 mmol/L (ref 0.5–2.0)

## 2014-12-08 LAB — CARBOXYHEMOGLOBIN
Carboxyhemoglobin: 0.9 % (ref 0.5–1.5)
Methemoglobin: 1.3 % (ref 0.0–1.5)
O2 Saturation: 64.6 %
Total hemoglobin: 10.4 g/dL — ABNORMAL LOW (ref 13.5–18.0)

## 2014-12-08 LAB — I-STAT CG4 LACTIC ACID, ED
Lactic Acid, Venous: 6.88 mmol/L (ref 0.5–2.0)
Lactic Acid, Venous: 6.81 mmol/L (ref 0.5–2.0)

## 2014-12-08 LAB — LIPASE, BLOOD: Lipase: 42 U/L (ref 22–51)

## 2014-12-08 MED ORDER — IOHEXOL 300 MG/ML  SOLN
50.0000 mL | Freq: Once | INTRAMUSCULAR | Status: AC | PRN
  Administered 2014-12-08: 50 mL via ORAL

## 2014-12-08 MED ORDER — HYDROCORTISONE NA SUCCINATE PF 100 MG IJ SOLR
100.0000 mg | Freq: Once | INTRAMUSCULAR | Status: AC
  Administered 2014-12-08: 100 mg via INTRAVENOUS
  Filled 2014-12-08: qty 2

## 2014-12-08 MED ORDER — SODIUM CHLORIDE 0.9 % IV BOLUS (SEPSIS)
1000.0000 mL | Freq: Once | INTRAVENOUS | Status: AC
  Administered 2014-12-08: 1000 mL via INTRAVENOUS

## 2014-12-08 MED ORDER — ONDANSETRON HCL 4 MG/2ML IJ SOLN: 4.0000 mg | Freq: Four times a day (QID) | INTRAMUSCULAR | Status: DC | PRN

## 2014-12-08 MED ORDER — IOHEXOL 300 MG/ML  SOLN
100.0000 mL | Freq: Once | INTRAMUSCULAR | Status: AC | PRN
Start: 2014-12-08 — End: 2014-12-08
  Administered 2014-12-08: 100 mL via INTRAVENOUS

## 2014-12-08 MED ORDER — PIPERACILLIN-TAZOBACTAM 3.375 G IVPB 30 MIN
3.3750 g | Freq: Once | INTRAVENOUS | Status: AC
  Administered 2014-12-08: 3.375 g via INTRAVENOUS
  Filled 2014-12-08: qty 50

## 2014-12-08 MED ORDER — ACETAMINOPHEN 325 MG PO TABS
650.0000 mg | ORAL_TABLET | Freq: Once | ORAL | Status: AC
  Administered 2014-12-09: 650 mg via ORAL
  Filled 2014-12-08: qty 2

## 2014-12-08 MED ORDER — VANCOMYCIN HCL IN DEXTROSE 1-5 GM/200ML-% IV SOLN
1000.0000 mg | INTRAVENOUS | Status: AC
  Administered 2014-12-08: 1000 mg via INTRAVENOUS
  Filled 2014-12-08: qty 200

## 2014-12-08 MED ORDER — HEPARIN SODIUM (PORCINE) 5000 UNIT/ML IJ SOLN
5000.0000 [IU] | Freq: Three times a day (TID) | INTRAMUSCULAR | Status: DC
  Administered 2014-12-08 – 2014-12-10 (×4): 5000 [IU] via SUBCUTANEOUS
  Filled 2014-12-08 (×11): qty 1

## 2014-12-08 MED ORDER — SODIUM CHLORIDE 0.9 % IV SOLN
INTRAVENOUS | Status: DC
  Administered 2014-12-08 – 2014-12-10 (×2): via INTRAVENOUS

## 2014-12-08 MED ORDER — SODIUM CHLORIDE 0.9 % IV SOLN: 250.0000 mL | INTRAVENOUS | Status: DC | PRN

## 2014-12-08 MED ORDER — PHENYLEPHRINE HCL 10 MG/ML IJ SOLN
30.0000 ug/min | INTRAVENOUS | Status: DC
  Filled 2014-12-08: qty 1

## 2014-12-08 MED ORDER — STERILE WATER FOR INJECTION IJ SOLN
INTRAMUSCULAR | Status: AC
  Filled 2014-12-08: qty 10

## 2014-12-08 MED ORDER — PIPERACILLIN-TAZOBACTAM 3.375 G IVPB
3.3750 g | Freq: Three times a day (TID) | INTRAVENOUS | Status: DC
  Administered 2014-12-08 – 2014-12-11 (×8): 3.375 g via INTRAVENOUS
  Filled 2014-12-08 (×9): qty 50

## 2014-12-08 MED ORDER — VANCOMYCIN HCL IN DEXTROSE 750-5 MG/150ML-% IV SOLN
750.0000 mg | Freq: Two times a day (BID) | INTRAVENOUS | Status: DC
  Administered 2014-12-09: 750 mg via INTRAVENOUS
  Filled 2014-12-08 (×2): qty 150

## 2014-12-08 MED ORDER — HYDROCORTISONE NA SUCCINATE PF 100 MG IJ SOLR
50.0000 mg | Freq: Four times a day (QID) | INTRAMUSCULAR | Status: DC
  Administered 2014-12-08 – 2014-12-10 (×7): 50 mg via INTRAVENOUS
  Filled 2014-12-08: qty 2
  Filled 2014-12-08 (×4): qty 1
  Filled 2014-12-08 (×3): qty 2

## 2014-12-08 MED ORDER — SODIUM CHLORIDE 0.9 % IV BOLUS (SEPSIS)
3000.0000 mL | Freq: Once | INTRAVENOUS | Status: AC
  Administered 2014-12-08: 3000 mL via INTRAVENOUS

## 2014-12-08 NOTE — H&P (Signed)
PULMONARY / CRITICAL CARE MEDICINE   Name: Rodney Sullivan MRN: [id number] DOB: 1941/04/27    ADMISSION DATE:  12/08/2014  REFERRING MD :  Dr. Wilson Singer / EDP  CHIEF COMPLAINT:  Hypotension / Lactic Acidosis   INITIAL PRESENTATION: 74 y/o M with PMH of Chron's Disease, E-Coli Bacteremia & DVT/PE who presented to [facility] ER on 6/2 with fever, abdominal pain and hypotension.    STUDIES:  6/02  Abd CT >> stable chronic fistulous tracts between the ileum, proximal duodenum and colon on R side of abd, no bowel obstruction, no abnormal fluid collection  SIGNIFICANT EVENTS: 6/01  Admit with fever, abd pain, hypotension    HISTORY OF PRESENT ILLNESS:  74 y/o M with a PMH of Chron's Disease (on prednisone 67m QD+ 1x monthly injection), DVT / PE (dx 11/27/13) previously on Xarelto (took for 3 months), cholecystectomy, hip/shoulder surgery and previous E-Coli bacteremia [place] [facility] [place] with acute onset right sided abdominal pain, nausea with one episode of small volume vomiting, rigors and fever to 102 at home.  EMS medicated the patient with 1gm of tylenol and 500 ml NS.  On arrival to ER, temp was down to 98.9.  Work up was notable for blood pressure of 103/54 which dipped into the mid 836'Usystolic.  He was treated with 4L total normal saline with improvement in blood pressure and empiric antibiotics (vanco/zosyn).  Pan cultures were obtained.  Labs notable for lactic acid of 6.88, WBC 9.9, Hgb 11.9, glucose 196 and cr 1.34.    The patient denies new foods or exposures.  He reports chronic diarrhea associated with Chron's disease but it is unchanged.  He denies melena, cough, sputum production, chest pain, pain with inspiration.  He does report mild shortness of breath, & R sided abdominal pain.  PCCM called for ICU admission.    PAST MEDICAL HISTORY :   has a past medical history of Crohn disease and DVT (deep venous thrombosis).  has past surgical history that includes  Abdominal surgery; Colon surgery; Cholecystectomy; Joint replacement; Hip surgery; and Shoulder surgery.   Prior to Admission medications   Medication Sig Start Date End Date Taking? Authorizing Provider  acetaminophen (TYLENOL) 500 MG tablet Take 500 mg by mouth every 6 (six) hours as needed for mild pain.   Yes Historical Provider, MD  allopurinol (ZYLOPRIM) 100 MG tablet Take 100 mg by mouth daily.   Yes Historical Provider, MD  aspirin EC 81 MG tablet Take 81 mg by mouth daily.   Yes Historical Provider, MD  Certolizumab Pegol (CIMZIA [place]ter) Inject 400 mg into the skin every 28 (twenty-eight) days.    Yes Historical Provider, MD  cholecalciferol (VITAMIN D) 1000 UNITS tablet Take 2,000 Units by mouth daily.   Yes Historical Provider, MD  clotrimazole-betamethasone (LOTRISONE) cream Apply 1 application topically 2 (two) times daily. On the side of mouth 09/05/14  Yes Historical Provider, MD  diphenhydrAMINE (BENADRYL) 25 MG tablet Take 25 mg by mouth 2 (two) times daily.   Yes Historical Provider, MD  latanoprost (XALATAN) 0.005 % ophthalmic solution Place 1 drop into both eyes at bedtime. 11/11/14  Yes Historical Provider, MD  mesalamine (PENTASA) 500 MG CR capsule Take 2,000 mg by mouth 2 (two) times daily.   Yes Historical Provider, MD  montelukast (SINGULAIR) 10 MG tablet Take 10 mg by mouth at bedtime.   Yes Historical Provider, MD  Multiple Vitamin (MULTIVITAMIN WITH MINERALS) TABS Take 1 tablet by mouth  daily.   Yes Historical Provider, MD  omega-3 acid ethyl esters (LOVAZA) 1 G capsule Take 1 g by mouth daily.    Yes Historical Provider, MD  pantoprazole (PROTONIX) 40 MG tablet Take 40 mg by mouth daily.   Yes Historical Provider, MD  predniSONE (DELTASONE) 5 MG tablet Take 15 mg by mouth daily with breakfast.    Yes Historical Provider, MD  sodium chloride (OCEAN) 0.65 % SOLN nasal spray Place 2 sprays into both nostrils 2 (two) times daily. Patient taking differently: Place 2 sprays into  both nostrils 2 (two) times daily as needed for congestion (allergies).  12/17/13  Yes Erick Colace, NP  traMADol (ULTRAM) 50 MG tablet Take 50 mg by mouth every 6 (six) hours as needed for pain.   Yes Historical Provider, MD  vitamin B-12 (CYANOCOBALAMIN) 1000 MCG tablet Take 5,000 mcg by mouth daily.    Yes Historical Provider, MD  cefdinir (OMNICEF) 300 MG capsule Take 1 capsule (300 mg total) by mouth 2 (two) times daily. Patient not taking: Reported on 12/08/2014 12/17/13   Erick Colace, NP  Rivaroxaban (XARELTO STARTER PACK) 15 & 20 MG TBPK Take as directed on package: Start with one 9m tablet by mouth twice a day with food. On Day 22, switch to one 234mtablet once a day with food. Patient not taking: Reported on 12/08/2014 12/17/13   PeErick ColaceNP  rivaroxaban (XARELTO) 20 MG TABS tablet Take 1 tablet (20 mg total) by mouth daily with supper. Patient not taking: Reported on 12/08/2014 12/17/13   PeErick ColaceNP  sulfamethoxazole-trimethoprim (BACTRIM DS,SEPTRA DS) 800-160 MG per tablet Take 1 tablet by mouth 2 (two) times daily.    Historical Provider, MD   Allergies  Allergen Reactions  . Ciprofloxacin     tendons hurt  . Other Hives    States he is allergic to 2 meds for allergies but can't recall name    FAMILY HISTORY:  has no family status information on file.    SOCIAL HISTORY:   reports that he has never smoked. He has never used smokeless tobacco. He reports that he does not drink alcohol or use illicit drugs.  REVIEW OF SYSTEMS:   Gen: Denies weight change, fatigue, night sweats.  Reports fevers / chills.   HEENT: Denies blurred vision, double vision, hearing loss, tinnitus, sinus congestion, rhinorrhea, sore throat, neck stiffness, dysphagia PULM: Denies cough, sputum production, hemoptysis, wheezing.  Reports shortness of breath.   CV: Denies chest pain, edema, orthopnea, paroxysmal nocturnal dyspnea, palpitations GI: Denies hematochezia, melena,  constipation, change in bowel habits.  Reports abdominal pain, nausea, vomiting, chronic diarrhea  GU: Denies dysuria, hematuria, polyuria, oliguria, urethral discharge Endocrine: Denies hot or cold intolerance, polyuria, polyphagia or appetite change Derm: Denies rash, dry skin, scaling or peeling skin change Heme: Denies easy bruising, bleeding, bleeding gums Neuro: Denies headache, numbness, weakness, slurred speech, loss of memory or consciousness   SUBJECTIVE:   VITAL SIGNS: Temp:  [98.9 F (37.2 C)] 98.9 F (37.2 C) (06/02 1037) Pulse Rate:  [118-122] 118 (06/02 1115) Resp:  [21-25] 22 (06/02 1100) BP: (103)/(54) 103/54 mmHg (06/02 1039) SpO2:  [94 %-98 %] 97 % (06/02 1115)   HEMODYNAMICS:     VENTILATOR SETTINGS:     INTAKE / OUTPUT: No intake or output data in the 24 hours ending 12/08/14 1211  PHYSICAL EXAMINATION: General:  Obese male in NAD Neuro:  AAOx4, speech clear, MAE  HEENT:  MM pink/dry,  no jvd  Cardiovascular:  s1s2 rrr, no m/r/g Lungs:  Non-labored, appears to be splinting, lungs bilaterally clear  Abdomen:  Obese, soft, NT to palpation, R sided old surgical scar, chole scar  Musculoskeletal:  No acute deformities  Skin:  Warm/dry, trace RLE edema   LABS:  CBC  Recent Labs Lab 12/08/14 1050  WBC 9.9  HGB 11.9*  HCT 38.7*  PLT 176   Coag's No results for input(s): APTT, INR in the last 168 hours.   BMET  Recent Labs Lab 12/08/14 1050  NA 138  K 3.5  CL 103  CO2 20*  BUN 15  CREATININE 1.34*  GLUCOSE 196*   Electrolytes  Recent Labs Lab 12/08/14 1050  CALCIUM 8.5*   Sepsis Markers  Recent Labs Lab 12/08/14 1056  LATICACIDVEN 6.88*   ABG No results for input(s): PHART, PCO2ART, PO2ART in the last 168 hours.   Liver Enzymes  Recent Labs Lab 12/08/14 1050  AST 74*  ALT 47  ALKPHOS 74  BILITOT 0.7  ALBUMIN 3.1*   Cardiac Enzymes No results for input(s): TROPONINI, PROBNP in the last 168 hours.   Glucose No  results for input(s): GLUCAP in the last 168 hours.  Imaging No results found.   ASSESSMENT / PLAN:  PULMONARY A: At Risk Atelectasis - in setting of abdominal pain / splinting  P:   Pulmonary hygiene:  IS, mobilize as able   CARDIOVASCULAR CVL A:  Septic Shock - ddx abdominal vs urine / prostate.  BP responding to volume.  No hx of HTN.  P:  Volume resuscitation, s/p 4L bolus NS @ 125/hr ICU monitoring  Follow repeat lactic acid, if rising, will place central line  RENAL A:   Acute Kidney Injury  Lactic Acidosis  P:   Trend BMP / UOP  Replace electrolytes as indicated  Trend lactic acid, repeat at 1500  GASTROINTESTINAL A:   RUQ Abdominal Pain  Chron's Disease  Nausea / Vomiting (x1) P:   NPO CT without acute process PRN zofran    HEMATOLOGIC A:   Anemia Hx DVT / PE (11/27/13) P:  Trend CBC Tx for Hgb < 7%, active bleeding or MI <8%   INFECTIOUS A:   Fever Septic Shock  Abdominal Pain  P:   BCx2 6/2 >>  UA 6/2 >> UC 6/2 >>   Vanco, start date 6/2 >> Zosyn, start date 6/2 >>  ENDOCRINE A:   Hyperglycemia - suspect stress response   P:   Monitor glucose on BMP.   If blood sugar consistently > 180, add SSI   NEUROLOGIC A:   Pain - abd P:   RASS goal: 0 Monitor    FAMILY  - Updates: Wife updated at bedside     Noe Gens, NP-C [place] Pgr: [phone] or [phone] 12/08/2014, 12:11 PM

## 2014-12-08 NOTE — ED Notes (Signed)
Intensivist MD at bedside.

## 2014-12-08 NOTE — Progress Notes (Signed)
CRITICAL VALUE ALERT  Critical value received: lactic acid 6.9  Date of notification: 12/08/2014 Time of notification: 1609 Critical value read backyes Nurse who received alert: Lonie Peak  MD notified (1st page): Dr Salley Hews Time of first page:  Spoke with from [facility]  MD notified (2nd page):  Time of second page:  Responding MD  Time MD responded:

## 2014-12-08 NOTE — ED Notes (Signed)
Patient transported to CT 

## 2014-12-08 NOTE — ED Notes (Signed)
Bed: KS28 Expected date:  Expected time:  Means of arrival:  Comments: EMS- fever, possible sepsis

## 2014-12-08 NOTE — Progress Notes (Signed)
pcp is Dr Virgel Bouquet [facility]

## 2014-12-08 NOTE — Progress Notes (Signed)
CRITICAL VALUE ALERT  Critical value received: lactic acid 6.5  Date of notification:  12/08/2014  Time of notification:  2100  Critical value read back yes  Nurse who received alert:  Perry Mount RN  MD notified (1st page):  Dr.Munghal at 2136  Time of first page:  2136  MD notified (2nd page):no  Time of second page:no  Responding MD:  none  Time MD responded:  none

## 2014-12-08 NOTE — Progress Notes (Addendum)
ANTIBIOTIC CONSULT NOTE - INITIAL  Pharmacy Consult for Vancomycin Indication: sepsis  Allergies  Allergen Reactions  . Ciprofloxacin     tendons hurt  . Other Hives    States he is allergic to 2 meds for allergies but can't recall name    Patient Measurements:   Body Weight 90.9 kg as of last year 12/2013  Vital Signs: Temp: 98.9 F (37.2 C) (06/02 1037) Temp Source: Oral (06/02 1037) BP: 103/54 mmHg (06/02 1039) Pulse Rate: 122 (06/02 1039) Intake/Output from previous day:   Intake/Output from this shift:    Labs:  Recent Labs  12/08/14 1050  WBC 9.9  HGB 11.9*  PLT 176   CrCl cannot be calculated (Unknown ideal weight.). No results for input(s): VANCOTROUGH, VANCOPEAK, VANCORANDOM, GENTTROUGH, GENTPEAK, GENTRANDOM, TOBRATROUGH, TOBRAPEAK, TOBRARND, AMIKACINPEAK, AMIKACINTROU, AMIKACIN in the last 72 hours.   Microbiology: No results found for this or any previous visit (from the past 720 hour(s)).  Medical History: Past Medical History  Diagnosis Date  . Crohn disease   . DVT (deep venous thrombosis)     Assessment: 36 yoM presents with c/o RUQ abdominal pain and nausea with fever of 102 degrees F, along with significant abdominal distention concentrated at upper abdomen mid-axillary line, diminished lung sounds in lower lobes.  Pharmacy consulted to start vancomycin for sepsis.  Zosyn x 1 ordered by MD.  Goal of Therapy:  Vancomycin trough level 15-20 mcg/ml  Doses adjusted per renal function Eradication of infection  Plan:  Vancomycin 1g IV x 1 now. F/u updated body weight and SCr for further doses.    Hershal Coria 12/08/2014,11:19 AM   Addendum: 12/08/2014 12:20 PM Reported height 5'7" and weight 183 lbs (83 kg) SCr 1.34 (AKI), CrCl~58 ml/min (CG), ~50 ml/min (normalized)  Plan: Vancomycin 750 mg IV q12h. F/u for order for continued Zosyn.   F/u SCr, trough levels, cultures, clinical course.  Hershal Coria, PharmD, BCPS Pager:  [phone] 12/08/2014 12:25 PM

## 2014-12-08 NOTE — Procedures (Signed)
Central Venous Catheter Insertion Procedure Note Marcelles Clinard [id number] 1941/04/02  Procedure: Insertion of Central Venous Catheter Indications: Assessment of intravascular volume, Drug and/or fluid administration and Frequent blood sampling  Procedure Details Consent: Risks of procedure as well as the alternatives and risks of each were explained to the (patient/caregiver).  Consent for procedure obtained.   Time Out: Verified patient identification, verified procedure, site/side was marked, verified correct patient position, special equipment/implants available, medications/allergies/relevent history reviewed, required imaging and test results available.  Performed  Maximum sterile technique was used including antiseptics, cap, gloves, gown, hand hygiene, mask and sheet. Skin prep: Chlorhexidine; local anesthetic administered.  A antimicrobial bonded/coated triple lumen catheter was placed in the right internal jugular vein using the Seldinger technique.  Evaluation Blood flow good Complications: No apparent complications Patient did tolerate procedure well. Chest X-ray ordered to verify placement.  CXR: pending.   Procedure performed with ultrasound guidance for real time vessel cannulation.      Noe Gens, NP-C [facility] Pgr: [phone] or [phone] 12/08/2014, 5:00 PM

## 2014-12-08 NOTE — ED Notes (Signed)
Bed: RESB Expected date:  Expected time:  Means of arrival:  Comments:

## 2014-12-08 NOTE — ED Notes (Signed)
Per EMS pt c/o RUQ abdominal pain and nausea onset today with fever of 102 degrees F, along with significant abdominal distention concentrated at upper abdomen mid-axillary line, diminished lung sounds in lower lobes. EMS administered 500 mL NS and 1 gram of Tylenol en route.

## 2014-12-08 NOTE — ED Notes (Signed)
Per nurse she will draw labs once pt finish fluids

## 2014-12-08 NOTE — ED Provider Notes (Signed)
CSN: [id number]     Arrival date & time 12/08/14  1032 History   First MD Initiated Contact with Patient 12/08/14 1040     Chief Complaint  Patient presents with  . Abdominal Pain     (Consider location/radiation/quality/duration/timing/severity/associated sxs/prior Treatment) HPI   74 year old male with abdominal pain and nausea. Symptom onset this morning. Reports fever to 102 at home. Receive 1 g of Tylenol by EMS prior to arrival. Past history significant for Crohn's disease. Surgical history significant for bowel resection 2 in the 1960s & cholecystectomy. Abdominal pain is deep and achy. Worse right upper quadrant. Constant but currently improved since onset. No urinary complaints.   Past Medical History  Diagnosis Date  . Crohn disease   . DVT (deep venous thrombosis)    Past Surgical History  Procedure Laterality Date  . Abdominal surgery    . Colon surgery    . Cholecystectomy    . Joint replacement    . Hip surgery      BOTH  . Shoulder surgery      RIGHT TENDON DETACHED   History reviewed. No pertinent family history. History  Substance Use Topics  . Smoking status: Never Smoker   . Smokeless tobacco: Never Used  . Alcohol Use: No    Review of Systems  All systems reviewed and negative, other than as noted in HPI.   Allergies  Ciprofloxacin and Other  Home Medications   Prior to Admission medications   Medication Sig Start Date End Date Taking? Authorizing Provider  acetaminophen (TYLENOL) 500 MG tablet Take 500 mg by mouth every 6 (six) hours as needed for mild pain.    Historical Provider, MD  allopurinol (ZYLOPRIM) 100 MG tablet Take 100 mg by mouth daily.    Historical Provider, MD  cefdinir (OMNICEF) 300 MG capsule Take 1 capsule (300 mg total) by mouth 2 (two) times daily. 12/17/13   Erick Colace, NP  Certolizumab Pegol (CIMZIA Sharp) Inject 400 mg into the skin every 30 (thirty) days.    Historical Provider, MD  cholecalciferol (VITAMIN D) 1000  UNITS tablet Take 2,000 Units by mouth daily.    Historical Provider, MD  diphenhydrAMINE (BENADRYL) 25 MG tablet Take 25 mg by mouth 2 (two) times daily.    Historical Provider, MD  mesalamine (PENTASA) 500 MG CR capsule Take 2,000 mg by mouth 2 (two) times daily.    Historical Provider, MD  montelukast (SINGULAIR) 10 MG tablet Take 10 mg by mouth at bedtime.    Historical Provider, MD  Multiple Vitamin (MULTIVITAMIN WITH MINERALS) TABS Take 1 tablet by mouth daily.    Historical Provider, MD  omega-3 acid ethyl esters (LOVAZA) 1 G capsule Take 1 g by mouth 2 (two) times daily.     Historical Provider, MD  pantoprazole (PROTONIX) 40 MG tablet Take 40 mg by mouth daily.    Historical Provider, MD  predniSONE (DELTASONE) 5 MG tablet Take 5 mg by mouth 3 (three) times daily.    Historical Provider, MD  Rivaroxaban (XARELTO STARTER PACK) 15 & 20 MG TBPK Take as directed on package: Start with one 41m tablet by mouth twice a day with food. On Day 22, switch to one 225mtablet once a day with food. 12/17/13   PeErick ColaceNP  rivaroxaban (XARELTO) 20 MG TABS tablet Take 1 tablet (20 mg total) by mouth daily with supper. 12/17/13   PeErick ColaceNP  sodium chloride (OCEAN) 0.65 % SOLN nasal spray Place  2 sprays into both nostrils 2 (two) times daily. 12/17/13   Erick Colace, NP  traMADol (ULTRAM) 50 MG tablet Take 50 mg by mouth every 6 (six) hours as needed for pain.    Historical Provider, MD  vitamin B-12 (CYANOCOBALAMIN) 1000 MCG tablet Take 5,000 mcg by mouth daily.     Historical Provider, MD   BP 103/54 mmHg  Pulse 122  Temp(Src) 98.9 F (37.2 C) (Oral)  Resp 21  SpO2 95% Physical Exam  Constitutional: He appears well-developed and well-nourished.  HENT:  Head: Normocephalic and atraumatic.  Eyes: Conjunctivae are normal. Right eye exhibits no discharge. Left eye exhibits no discharge.  Neck: Neck supple.  Cardiovascular: Regular rhythm and normal heart sounds.  Exam reveals no  gallop and no friction rub.   No murmur heard. tachycardic  Pulmonary/Chest: Effort normal and breath sounds normal. No respiratory distress.  Abdominal: Soft. He exhibits distension. There is no tenderness.  Pt seems distended, but reports appearance is actually typical. Soft. No tympany. Mild epigastric and RUQ tenderness w/o rebound or guarding.   Musculoskeletal: He exhibits no edema or tenderness.  Neurological: He is alert.  Skin: Skin is warm and dry.  Psychiatric: He has a normal mood and affect. His behavior is normal. Thought content normal.  Nursing note and vitals reviewed.   ED Course  Procedures (including critical care time)  CRITICAL CARE Performed by: Virgel Manifold Total critical care time: 35 minutes Critical care time was exclusive of separately billable procedures and treating other patients. Critical care was necessary to treat or prevent imminent or life-threatening deterioration. Critical care was time spent personally by me on the following activities: development of treatment plan with patient and/or surrogate as well as nursing, discussions with consultants, evaluation of patient's response to treatment, examination of patient, obtaining history from patient or surrogate, ordering and performing treatments and interventions, ordering and review of laboratory studies, ordering and review of radiographic studies, pulse oximetry and re-evaluation of patient's condition.  Labs Review Labs Reviewed  CBC WITH DIFFERENTIAL/PLATELET - Abnormal; Notable for the following:    Hemoglobin 11.9 (*)    HCT 38.7 (*)    MCH 25.0 (*)    Neutrophils Relative % 90 (*)    Neutro Abs 9.0 (*)    Lymphocytes Relative 5 (*)    Lymphs Abs 0.5 (*)    All other components within normal limits  COMPREHENSIVE METABOLIC PANEL - Abnormal; Notable for the following:    CO2 20 (*)    Glucose, Bld 196 (*)    Creatinine, Ser 1.34 (*)    Calcium 8.5 (*)    Total Protein 5.7 (*)     Albumin 3.1 (*)    AST 74 (*)    GFR calc non Af Amer 51 (*)    GFR calc Af Amer 59 (*)    All other components within normal limits  I-STAT CG4 LACTIC ACID, ED - Abnormal; Notable for the following:    Lactic Acid, Venous 6.88 (*)    All other components within normal limits  CULTURE, BLOOD (ROUTINE X 2)  CULTURE, BLOOD (ROUTINE X 2)  LIPASE, BLOOD  URINALYSIS, ROUTINE W REFLEX MICROSCOPIC (NOT AT [facility])  LACTIC ACID, PLASMA  I-STAT CG4 LACTIC ACID, ED    Imaging Review Dg Chest 2 View  12/08/2014   CLINICAL DATA:  Fever. Right upper quadrant pain and abdominal distention. Crohn's disease.  EXAM: CHEST  2 VIEW  COMPARISON:  Radiographs dated 12/16/2013, 12/14/2013 and  11/27/2013  FINDINGS: Heart size and pulmonary vascularity is normal. Calcification and tortuosity of the thoracic aorta. Chronic elevation of the right hemidiaphragm. The lungs are clear. No effusions. No significant osseous abnormality.  IMPRESSION: No acute disease.   Electronically Signed   By: Lorriane Shire M.D.   On: 12/08/2014 12:31   Ct Abdomen Pelvis W Contrast  12/08/2014   CLINICAL DATA:  Acute right upper quadrant abdominal pain and nausea.  EXAM: CT ABDOMEN AND PELVIS WITH CONTRAST  TECHNIQUE: Multidetector CT imaging of the abdomen and pelvis was performed using the standard protocol following bolus administration of intravenous contrast.  CONTRAST:  126m OMNIPAQUE IOHEXOL 300 MG/ML  SOLN  COMPARISON:  CT scan of December 14, 2013.  FINDINGS: Mild degenerative disc disease is noted at L5-S1. Stable 9 mm right hilar lymph node is noted. No acute pulmonary disease is seen in visualized lung bases.  There is again noted reactive enhancement along the gallbladder fossa within the liver. This is stable compared to prior exam. The spleen and pancreas appear normal. Adrenal glands appear normal. No hydronephrosis or renal obstruction is noted. Stable right renal cysts are noted. Status post cholecystectomy. There is no evidence  of bowel obstruction.  Status post partial colectomy. There remains wall thickening and abnormal soft tissue density between the proximal duodenum, colon and distal small bowel consistent with chronic fistula as described on prior exam. This is unchanged. No abnormal fluid collection is noted.  Evaluation of the pelvis is limited due to scatter artifact arising from bilateral hip arthroplasties. There is no evidence of abdominal aortic aneurysm. Visualized portion of urinary bladder appears normal. No significant adenopathy is noted.  IMPRESSION: Stable chronic fistulous tracts are seen between the ileum, proximal duodenum and colon in the right side of the abdomen.  No evidence of bowel obstruction is noted. No abnormal fluid collection is noted.   Electronically Signed   By: JMarijo Conception M.D.   On: 12/08/2014 13:09     EKG Interpretation None      MDM   Final diagnoses:  Fever  Septic shock    74year old male with abdominal pain but fairly mild tenderness on exam. Declining pain medication. Afebrile in the ED, but reports fever over 102 this morning and received 1 g of Tylenol prior to arrival. Tachycardic and hypotensive. Significant elevation in lactate. Concern for sepsis. We'll bolus IV fluids and reassess. Empiric antibiotics. Patient chronically on steroids for history of Crohn's disease. Stress dose of hydrocortisone. CT a/p. CXR. Urine. Blood cultures. Will need admission.   Hypotension worsening despite about 3L NS at this point. Will give additional IVF. May need central line and pressors. [place]   SVirgel Manifold MD 12/08/14 1440

## 2014-12-09 LAB — BASIC METABOLIC PANEL
Anion gap: 10 (ref 5–15)
BUN: 16 mg/dL (ref 6–20)
CALCIUM: 7 mg/dL — AB (ref 8.9–10.3)
CO2: 19 mmol/L — ABNORMAL LOW (ref 22–32)
Chloride: 112 mmol/L — ABNORMAL HIGH (ref 101–111)
Creatinine, Ser: 1.27 mg/dL — ABNORMAL HIGH (ref 0.61–1.24)
GFR calc Af Amer: >60 mL/min (ref >60)
GFR calc non Af Amer: 55 mL/min — ABNORMAL LOW (ref >60)
Glucose, Bld: 145 mg/dL — ABNORMAL HIGH (ref 65–99)
Potassium: 3.8 mmol/L (ref 3.5–5.1)
Sodium: 141 mmol/L (ref 135–145)

## 2014-12-09 LAB — CBC
HCT: 30.6 % — ABNORMAL LOW (ref 39.0–52.0)
Hemoglobin: 9.4 g/dL — ABNORMAL LOW (ref 13.0–17.0)
MCH: 25.7 pg — ABNORMAL LOW (ref 26.0–34.0)
MCHC: 30.7 g/dL (ref 30.0–36.0)
MCV: 83.6 fL (ref 78.0–100.0)
Platelets: 155 10*3/uL (ref 150–400)
RBC: 3.66 MIL/uL — ABNORMAL LOW (ref 4.22–5.81)
RDW: 16.1 % — ABNORMAL HIGH (ref 11.5–15.5)
WBC: 19.7 10*3/uL — AB (ref 4.0–10.5)

## 2014-12-09 LAB — CLOSTRIDIUM DIFFICILE BY PCR: Toxigenic C. Difficile by PCR: NEGATIVE

## 2014-12-09 LAB — LACTIC ACID, PLASMA: LACTIC ACID, VENOUS: 4.6 mmol/L — AB (ref 0.5–2.0)

## 2014-12-09 MED ORDER — SODIUM CHLORIDE 0.9 % IV BOLUS (SEPSIS): 1000.0000 mL | Freq: Once | INTRAVENOUS | Status: DC

## 2014-12-09 MED ORDER — MONTELUKAST SODIUM 10 MG PO TABS
10.0000 mg | ORAL_TABLET | Freq: Every day | ORAL | Status: DC
  Administered 2014-12-09 – 2014-12-10 (×2): 10 mg via ORAL
  Filled 2014-12-09 (×2): qty 1

## 2014-12-09 MED ORDER — OMEGA-3-ACID ETHYL ESTERS 1 G PO CAPS
1.0000 g | ORAL_CAPSULE | Freq: Every day | ORAL | Status: DC
  Administered 2014-12-10 – 2014-12-11 (×2): 1 g via ORAL
  Filled 2014-12-09 (×3): qty 1

## 2014-12-09 MED ORDER — PANTOPRAZOLE SODIUM 40 MG PO TBEC
40.0000 mg | DELAYED_RELEASE_TABLET | Freq: Every day | ORAL | Status: DC
  Administered 2014-12-09 – 2014-12-11 (×3): 40 mg via ORAL
  Filled 2014-12-09 (×3): qty 1

## 2014-12-09 MED ORDER — PREDNISONE 5 MG PO TABS
15.0000 mg | ORAL_TABLET | Freq: Every day | ORAL | Status: DC
  Administered 2014-12-10 – 2014-12-11 (×2): 15 mg via ORAL
  Filled 2014-12-09 (×2): qty 1

## 2014-12-09 MED ORDER — ACETAMINOPHEN 325 MG PO TABS
650.0000 mg | ORAL_TABLET | Freq: Once | ORAL | Status: AC
  Administered 2014-12-09: 650 mg via ORAL
  Filled 2014-12-09: qty 2

## 2014-12-09 MED ORDER — ALLOPURINOL 100 MG PO TABS
100.0000 mg | ORAL_TABLET | Freq: Every day | ORAL | Status: DC
  Administered 2014-12-09 – 2014-12-11 (×3): 100 mg via ORAL
  Filled 2014-12-09 (×3): qty 1

## 2014-12-09 MED ORDER — LATANOPROST 0.005 % OP SOLN
1.0000 [drp] | Freq: Every day | OPHTHALMIC | Status: DC
  Administered 2014-12-09 – 2014-12-10 (×2): 1 [drp] via OPHTHALMIC
  Filled 2014-12-09: qty 2.5

## 2014-12-09 MED ORDER — ASPIRIN EC 81 MG PO TBEC
81.0000 mg | DELAYED_RELEASE_TABLET | Freq: Every day | ORAL | Status: DC
  Administered 2014-12-10 – 2014-12-11 (×2): 81 mg via ORAL
  Filled 2014-12-09 (×4): qty 1

## 2014-12-09 MED ORDER — SODIUM CHLORIDE 0.9 % IV BOLUS (SEPSIS)
1000.0000 mL | Freq: Once | INTRAVENOUS | Status: AC
  Administered 2014-12-09: 1000 mL via INTRAVENOUS

## 2014-12-09 NOTE — Progress Notes (Signed)
[facility] Progress Note Patient Name: Rodney Sullivan DOB: 02-15-1941 MRN: [id number]   Date of Service  12/09/2014  HPI/Events of Note  Hypotension. BP 87/57. CVP = 8.  eICU Interventions  Will bolus with 0.9 NaCl 1 liter IV over 1 hour now.     Intervention Category Intermediate Interventions: Hypotension - evaluation and management  Sommer,Steven Eugene 12/09/2014, 1:46 AM

## 2014-12-09 NOTE — Progress Notes (Signed)
PULMONARY / CRITICAL CARE MEDICINE   Name: Rodney Sullivan MRN: [id number] DOB: 01-23-1941    ADMISSION DATE:  12/08/2014  REFERRING MD :  Dr. Wilson Singer / EDP  CHIEF COMPLAINT:  Hypotension / Lactic Acidosis   INITIAL PRESENTATION: 74 y/o M with a PMH of Chron's Disease (on prednisone 66m QD+ 1x monthly injection), DVT / PE (dx 11/27/13) previously on Xarelto (took for 3 months), cholecystectomy, hip/shoulder surgery and previous E-Coli bacteremia who [facility] with fever, abdominal pain and hypotension & lactic acidosis    STUDIES:  6/02  Abd CT >> stable chronic fistulous tracts between the ileum, proximal duodenum and colon on R side of abd, no bowel obstruction, no abnormal fluid collection  SIGNIFICANT EVENTS: 6/01  Admit with fever, abd pain, hypotension     SUBJECTIVE: Feels much better Loose stools  Afebrile No abd pain  VITAL SIGNS: Temp:  [98.3 F (36.8 C)-99.5 F (37.5 C)] 98.3 F (36.8 C) (06/03 0429) Pulse Rate:  [71-122] 71 (06/03 0800) Resp:  [13-26] 13 (06/03 0800) BP: (74-123)/(44-69) 102/60 mmHg (06/03 0800) SpO2:  [94 %-100 %] 98 % (06/03 0800) Weight:  [193 lb 9 oz (87.8 kg)-195 lb 8.8 oz (88.7 kg)] 193 lb 9 oz (87.8 kg) (06/03 0429)   HEMODYNAMICS: CVP:  [8 mmHg-11 mmHg] 9 mmHg   VENTILATOR SETTINGS:     INTAKE / OUTPUT:  Intake/Output Summary (Last 24 hours) at 12/09/14 0857 Last data filed at 12/09/14 0800  Gross per 24 hour  Intake 7812.5 ml  Output   1600 ml  Net 6212.5 ml    PHYSICAL EXAMINATION: General:  Obese male in NAD Neuro:  AAOx4, speech clear, MAE  HEENT:  MM pink/dry, no jvd  Cardiovascular:  s1s2 rrr, no m/r/g Lungs:  Non-labored, appears to be splinting, lungs bilaterally clear  Abdomen:  Obese, soft, NT to palpation, R sided old surgical scar, chole scar  Musculoskeletal:  No acute deformities  Skin:  Warm/dry, trace RLE edema   LABS:  CBC  Recent Labs Lab 12/08/14 1050 12/09/14 0346  WBC 9.9 19.7*   HGB 11.9* 9.4*  HCT 38.7* 30.6*  PLT 176 155   Coag's No results for input(s): APTT, INR in the last 168 hours.   BMET  Recent Labs Lab 12/08/14 1050 12/09/14 0346  NA 138 141  K 3.5 3.8  CL 103 112*  CO2 20* 19*  BUN 15 16  CREATININE 1.34* 1.27*  GLUCOSE 196* 145*   Electrolytes  Recent Labs Lab 12/08/14 1050 12/09/14 0346  CALCIUM 8.5* 7.0*   Sepsis Markers  Recent Labs Lab 12/08/14 1431 12/08/14 2020 12/09/14 0345  LATICACIDVEN 6.81* 6.5* 4.6*   ABG No results for input(s): PHART, PCO2ART, PO2ART in the last 168 hours.   Liver Enzymes  Recent Labs Lab 12/08/14 1050  AST 74*  ALT 47  ALKPHOS 74  BILITOT 0.7  ALBUMIN 3.1*   Cardiac Enzymes  Recent Labs Lab 12/08/14 1715  TROPONINI <0.03     Glucose No results for input(s): GLUCAP in the last 168 hours.  Imaging Dg Chest 2 View  12/08/2014   CLINICAL DATA:  Fever. Right upper quadrant pain and abdominal distention. Crohn's disease.  EXAM: CHEST  2 VIEW  COMPARISON:  Radiographs dated 12/16/2013, 12/14/2013 and 11/27/2013  FINDINGS: Heart size and pulmonary vascularity is normal. Calcification and tortuosity of the thoracic aorta. Chronic elevation of the right hemidiaphragm. The lungs are clear. No effusions. No significant osseous abnormality.  IMPRESSION:  No acute disease.   Electronically Signed   By: Lorriane Shire M.D.   On: 12/08/2014 12:31   Ct Abdomen Pelvis W Contrast  12/08/2014   CLINICAL DATA:  Acute right upper quadrant abdominal pain and nausea.  EXAM: CT ABDOMEN AND PELVIS WITH CONTRAST  TECHNIQUE: Multidetector CT imaging of the abdomen and pelvis was performed using the standard protocol following bolus administration of intravenous contrast.  CONTRAST:  123m OMNIPAQUE IOHEXOL 300 MG/ML  SOLN  COMPARISON:  CT scan of December 14, 2013.  FINDINGS: Mild degenerative disc disease is noted at L5-S1. Stable 9 mm right hilar lymph node is noted. No acute pulmonary disease is seen in  visualized lung bases.  There is again noted reactive enhancement along the gallbladder fossa within the liver. This is stable compared to prior exam. The spleen and pancreas appear normal. Adrenal glands appear normal. No hydronephrosis or renal obstruction is noted. Stable right renal cysts are noted. Status post cholecystectomy. There is no evidence of bowel obstruction.  Status post partial colectomy. There remains wall thickening and abnormal soft tissue density between the proximal duodenum, colon and distal small bowel consistent with chronic fistula as described on prior exam. This is unchanged. No abnormal fluid collection is noted.  Evaluation of the pelvis is limited due to scatter artifact arising from bilateral hip arthroplasties. There is no evidence of abdominal aortic aneurysm. Visualized portion of urinary bladder appears normal. No significant adenopathy is noted.  IMPRESSION: Stable chronic fistulous tracts are seen between the ileum, proximal duodenum and colon in the right side of the abdomen.  No evidence of bowel obstruction is noted. No abnormal fluid collection is noted.   Electronically Signed   By: JMarijo Conception M.D.   On: 12/08/2014 13:09   Dg Chest Port 1 View  12/08/2014   CLINICAL DATA:  Central line placement.  EXAM: PORTABLE CHEST - 1 VIEW  COMPARISON:  12/08/2014 at 11:24  a.m.  FINDINGS: Right internal jugular center venous catheter tip: SVC. No pneumothorax.  Low lung volumes are present, causing crowding of the pulmonary vasculature. Bilateral mild interstitial accentuation could be from pulmonary venous hypertension. However, this may be positional as the image was obtained with the patient in the semi erect position.  Borderline enlargement of the cardiopericardial silhouette. Atherosclerotic aortic arch. Borderline elevation of the right hemidiaphragm.  IMPRESSION: 1. Right IJ line tip: SVC. No pneumothorax or complicating feature. 2. Cephalization of blood flow may be  due to positioning or could be from pulmonary venous hypertension. 3. Atherosclerotic aortic arch.   Electronically Signed   By: WVan ClinesM.D.   On: 12/08/2014 17:22     ASSESSMENT / PLAN:  PULMONARY A: At Risk Atelectasis - in setting of abdominal pain / splinting  P:   Pulmonary hygiene:  IS, mobilize as able   CARDIOVASCULAR CVL 6/2 >> A:  Septic Shock -resolved P:  NS @ 75/hr   RENAL A:   Acute Kidney Injury -resolving Lactic Acidosis  P:   Trend BMP / UOP  Replace electrolytes as indicated  Slow lactate clearance ? D lactate, follow  GASTROINTESTINAL A:   RUQ Abdominal Pain  Chron's Disease -CT without acute process , chronic fistula Nausea / Vomiting (x1) P:   NPO PRN zofran    HEMATOLOGIC A:   Anemia Hx DVT / PE (11/27/13) P:  Trend CBC Tx for Hgb < 7%, active bleeding or MI <8%   INFECTIOUS A:   Fever Septic Shock  Abdominal Pain  P:   BCx2 6/2 >>  UA 6/2 >>neg  UC 6/2 >>   Vanco, start date 6/2 >>6/3 Zosyn, start date 6/2 >>  Chk c diff pcR 6/3 >>  ENDOCRINE A:   Hyperglycemia - suspect stress response   P:   Monitor glucose on BMP.   If blood sugar consistently > 180, add SSI   NEUROLOGIC A:   Pain - abd P:   RASS goal: 0 Monitor    FAMILY  - Updates: Wife updated at bedside    OK to mobilise, move oob, advance PO Ct ABx Trend lactate until resolution  Kara Mead MD. FCCP. [facility] Pager [phone] If no response call [phone]   12/09/2014, 8:57 AM

## 2014-12-09 NOTE — Progress Notes (Addendum)
[facility] Progress Note Patient Name: Marquett Bertoli DOB: Aug 01, 1940 MRN: [id number]   Date of Service  12/09/2014  HPI/Events of Note  BP = 87/56 and MAP = 65. CVP = 12. Last Lactic Acid level = 6.5 on 12/08/2014 at 8:20 PM.  eICU Interventions  Re-check Lactic Acid level s/p bolus to assess resolution. May need to start on Phenylephrine IV infusion.     Intervention Category Major Interventions: Acid-Base disturbance - evaluation and management  Sommer,Steven Eugene 12/09/2014, 3:12 AM

## 2014-12-10 DIAGNOSIS — E876 Hypokalemia: Secondary | ICD-10-CM

## 2014-12-10 LAB — CBC
HEMATOCRIT: 34.2 % — AB (ref 39.0–52.0)
Hemoglobin: 10.5 g/dL — ABNORMAL LOW (ref 13.0–17.0)
MCH: 24.9 pg — ABNORMAL LOW (ref 26.0–34.0)
MCHC: 30.7 g/dL (ref 30.0–36.0)
MCV: 81.2 fL (ref 78.0–100.0)
PLATELETS: 156 10*3/uL (ref 150–400)
RBC: 4.21 MIL/uL — ABNORMAL LOW (ref 4.22–5.81)
RDW: 15.9 % — AB (ref 11.5–15.5)
WBC: 17.7 10*3/uL — ABNORMAL HIGH (ref 4.0–10.5)

## 2014-12-10 LAB — BASIC METABOLIC PANEL
ANION GAP: 11 (ref 5–15)
BUN: 14 mg/dL (ref 6–20)
CO2: 21 mmol/L — ABNORMAL LOW (ref 22–32)
Calcium: 7.9 mg/dL — ABNORMAL LOW (ref 8.9–10.3)
Chloride: 111 mmol/L (ref 101–111)
Creatinine, Ser: 1.11 mg/dL (ref 0.61–1.24)
GLUCOSE: 154 mg/dL — AB (ref 65–99)
POTASSIUM: 3.2 mmol/L — AB (ref 3.5–5.1)
Sodium: 143 mmol/L (ref 135–145)

## 2014-12-10 LAB — PHOSPHORUS: PHOSPHORUS: 2.8 mg/dL (ref 2.5–4.6)

## 2014-12-10 LAB — MAGNESIUM: Magnesium: 1.7 mg/dL (ref 1.7–2.4)

## 2014-12-10 LAB — LACTIC ACID, PLASMA: LACTIC ACID, VENOUS: 3.1 mmol/L — AB (ref 0.5–2.0)

## 2014-12-10 MED ORDER — FUROSEMIDE 10 MG/ML IJ SOLN
20.0000 mg | Freq: Once | INTRAMUSCULAR | Status: AC
  Administered 2014-12-10: 20 mg via INTRAVENOUS
  Filled 2014-12-10: qty 2

## 2014-12-10 MED ORDER — MAGNESIUM SULFATE IN D5W 10-5 MG/ML-% IV SOLN
1.0000 g | Freq: Once | INTRAVENOUS | Status: AC
  Administered 2014-12-10: 1 g via INTRAVENOUS
  Filled 2014-12-10: qty 100

## 2014-12-10 MED ORDER — SODIUM CHLORIDE 0.9 % IJ SOLN: 10.0000 mL | INTRAMUSCULAR | Status: DC | PRN

## 2014-12-10 MED ORDER — POTASSIUM CHLORIDE CRYS ER 20 MEQ PO TBCR
40.0000 meq | EXTENDED_RELEASE_TABLET | Freq: Once | ORAL | Status: AC
  Administered 2014-12-10: 40 meq via ORAL
  Filled 2014-12-10: qty 2

## 2014-12-10 MED ORDER — FUROSEMIDE 10 MG/ML IJ SOLN
40.0000 mg | Freq: Once | INTRAMUSCULAR | Status: AC
  Administered 2014-12-10: 40 mg via INTRAVENOUS
  Filled 2014-12-10: qty 4

## 2014-12-10 NOTE — Progress Notes (Signed)
Patient c/o SOB. Oxygen sats 99% on RA, BP 125/77. Patient with BLE swelling. Fine crackles in right lower lobe. NP on call notified. New order placed. Will continue to monitor closely

## 2014-12-10 NOTE — Progress Notes (Signed)
CRITICAL VALUE ALERT  Critical value received:  Lactic acid 3.1  Date of notification: 12/10/14  Time of notification:  0618  Critical value read back: yes  Nurse who received alert:  Virgina Norfolk  MD notified (1st page):  Rogue Bussing  Time of first page:  0620  MD notified (2nd page):  Time of second page:  Responding MD:  Rogue Bussing  Time MD responded:  [phone]

## 2014-12-10 NOTE — Progress Notes (Signed)
[facility] PROGRESS NOTE  Rodney Sullivan PNT:[id number] DOB: 07/29/1940 DOA: 12/08/2014 PCP: sees at [facility]  Brief narrative 12 74-year-old male with history of Crohn's disease (on daily prednisone, mesalamine and monthly centroluzimab injection, sees gastroenterologist at [facility]), history of DVT/PE in 2015 (was on Xarelto for 3 months), history of severe sepsis due to Escherichia coli bacteremia in June 2015 requiring ICU admission presented to the ED on 12/08/2014 with acute onset of right-sided abdominal pain, nausea with one episode of small amount of vomiting and fever of 102F at home. Patient was afebrile when he arrived to the ED but was hypotensive with systolic blood pressure in the 80s, lactic acid of 6.88 and mildly elevated creatinine 1.34. Patient given 4 L IV normal saline bolus and empiric IV vancomycin and Zosyn and admitted to ICU. CT of the abdomen and pelvis done on admission showed stable chronic fistulous tract between the ileum, proximal duodenum and right-sided colon without bowel obstruction or fluid collection. Patient improved with IV hydration with slow clearing of lactic acid and transferred to telemetry.     Assessment/Plan: Septic shock No clear etiology. Cultures so far negative. Continue empiric IV antibodies. Follow final blood culture results. Patient admitted in the posterior with Escherichia coli bacteremia and sepsis. Will check 2-D echo to rule out any vegetation. Currently he is afebrile and vitals stable. - discontinue IV fluids as patient now developing leg edema. Lasix prn.  discontinue central line.  Acute kidney injury Secondary to septic shock. Now resolved with fluid resuscitation.  Right upper quadrant abdominal pain History of Crohn's disease. CT abdomen without acute process and shows chronic fistula. No further nausea or vomiting and tolerating diet.  History of Crohn's disease Resume home medications. Follows  with GI at [facility].  Hypokalemia  replenished  Leukocytosis Secondary to sepsis and IV Solu-cortef received since admission. Switch to home dose prednisone. Monitor in  a.m.  DVT prophylaxis: Subcutaneous heparin  Diet: Regular  Code Status: Full code Family Communication: Wife at bedside Disposition Plan: Home possibly tomorrow if stable and cultures negative   Consultants:  PCCM  Procedures:  CT abd  Antibiotics:  IV vanco/ zosyn 6/2--  HPI/Subjective: Patient seen and examined. Denies any symptoms. Stable overnight.  Objective: Filed Vitals:   12/10/14 0515  BP: 129/80  Pulse: 75  Temp: 97.7 F (36.5 C)  Resp: 18    Intake/Output Summary (Last 24 hours) at 12/10/14 1138 Last data filed at 12/10/14 0900  Gross per 24 hour  Intake 2891.67 ml  Output   2300 ml  Net 591.67 ml   Filed Weights   12/09/14 0429 12/09/14 1812 12/10/14 0515  Weight: 87.8 kg (193 lb 9 oz) 89.54 kg (197 lb 6.4 oz) 89.903 kg (198 lb 3.2 oz)    Exam:   General:  Elderly male in NAD  HEENT: no pallor, moist mucosa, rt IJ, supple neck  Cardiovascular: NS1&S2, no mumurs, rubs or gallop  Respiratory: clear b/l, no added sounds  GI: Soft, nondistended, nontender, bowel sounds present  Musculoskeletal: On, trace edema  CNS: Alert and oriented  Data Reviewed: Basic Metabolic Panel:  Recent Labs Lab 12/08/14 1050 12/09/14 0346 12/10/14 0503  NA 138 141 143  K 3.5 3.8 3.2*  CL 103 112* 111  CO2 20* 19* 21*  GLUCOSE 196* 145* 154*  BUN 15 16 14   CREATININE 1.34* 1.27* 1.11  CALCIUM 8.5* 7.0* 7.9*  MG  --   --  1.7  PHOS  --   --  2.8   Liver Function Tests:  Recent Labs Lab 12/08/14 1050  AST 74*  ALT 47  ALKPHOS 74  BILITOT 0.7  PROT 5.7*  ALBUMIN 3.1*    Recent Labs Lab 12/08/14 1050  LIPASE 42   No results for input(s): AMMONIA in the last 168 hours. CBC:  Recent Labs Lab 12/08/14 1050 12/09/14 0346 12/10/14 0503  WBC 9.9  19.7* 17.7*  NEUTROABS 9.0*  --   --   HGB 11.9* 9.4* 10.5*  HCT 38.7* 30.6* 34.2*  MCV 81.3 83.6 81.2  PLT 176 155 156   Cardiac Enzymes:  Recent Labs Lab 12/08/14 1715  TROPONINI <0.03   BNP (last 3 results) No results for input(s): BNP in the last 8760 hours.  ProBNP (last 3 results)  Recent Labs  12/14/13 1140  PROBNP 206.2*    CBG: No results for input(s): GLUCAP in the last 168 hours.  Recent Results (from the past 240 hour(s))  Blood culture (routine x 2)     Status: None (Preliminary result)   Collection Time: 12/08/14 11:19 AM  Result Value Ref Range Status   Specimen Description BLOOD RIGHT ANTECUBITAL  Final   Special Requests BOTTLES DRAWN AEROBIC AND ANAEROBIC 5ML  Final   Culture   Final           BLOOD CULTURE RECEIVED NO GROWTH TO DATE CULTURE WILL BE HELD FOR 5 DAYS BEFORE ISSUING A FINAL NEGATIVE REPORT Performed at [facility]    Report Status PENDING  Incomplete  Blood culture (routine x 2)     Status: None (Preliminary result)   Collection Time: 12/08/14 11:19 AM  Result Value Ref Range Status   Specimen Description BLOOD LEFT ANTECUBITAL  Final   Special Requests BOTTLES DRAWN AEROBIC AND ANAEROBIC 5ML  Final   Culture   Final           BLOOD CULTURE RECEIVED NO GROWTH TO DATE CULTURE WILL BE HELD FOR 5 DAYS BEFORE ISSUING A FINAL NEGATIVE REPORT Performed at [facility]    Report Status PENDING  Incomplete  MRSA PCR Screening     Status: None   Collection Time: 12/08/14  3:23 PM  Result Value Ref Range Status   MRSA by PCR NEGATIVE NEGATIVE Final    Comment:        The GeneXpert MRSA Assay (FDA approved for NASAL specimens only), is one component of a comprehensive MRSA colonization surveillance program. It is not intended to diagnose MRSA infection nor to guide or monitor treatment for MRSA infections.   Clostridium Difficile by PCR     Status: None   Collection Time: 12/09/14  8:38 PM  Result Value Ref Range  Status   C difficile by pcr NEGATIVE NEGATIVE Final     Studies: Ct Abdomen Pelvis W Contrast  12/08/2014   CLINICAL DATA:  Acute right upper quadrant abdominal pain and nausea.  EXAM: CT ABDOMEN AND PELVIS WITH CONTRAST  TECHNIQUE: Multidetector CT imaging of the abdomen and pelvis was performed using the standard protocol following bolus administration of intravenous contrast.  CONTRAST:  111m OMNIPAQUE IOHEXOL 300 MG/ML  SOLN  COMPARISON:  CT scan of December 14, 2013.  FINDINGS: Mild degenerative disc disease is noted at L5-S1. Stable 9 mm right hilar lymph node is noted. No acute pulmonary disease is seen in visualized lung bases.  There is again noted reactive enhancement along the gallbladder fossa within the liver. This is  stable compared to prior exam. The spleen and pancreas appear normal. Adrenal glands appear normal. No hydronephrosis or renal obstruction is noted. Stable right renal cysts are noted. Status post cholecystectomy. There is no evidence of bowel obstruction.  Status post partial colectomy. There remains wall thickening and abnormal soft tissue density between the proximal duodenum, colon and distal small bowel consistent with chronic fistula as described on prior exam. This is unchanged. No abnormal fluid collection is noted.  Evaluation of the pelvis is limited due to scatter artifact arising from bilateral hip arthroplasties. There is no evidence of abdominal aortic aneurysm. Visualized portion of urinary bladder appears normal. No significant adenopathy is noted.  IMPRESSION: Stable chronic fistulous tracts are seen between the ileum, proximal duodenum and colon in the right side of the abdomen.  No evidence of bowel obstruction is noted. No abnormal fluid collection is noted.   Electronically Signed   By: Marijo Conception, M.D.   On: 12/08/2014 13:09   Dg Chest Port 1 View  12/08/2014   CLINICAL DATA:  Central line placement.  EXAM: PORTABLE CHEST - 1 VIEW  COMPARISON:  12/08/2014 at  11:24  a.m.  FINDINGS: Right internal jugular center venous catheter tip: SVC. No pneumothorax.  Low lung volumes are present, causing crowding of the pulmonary vasculature. Bilateral mild interstitial accentuation could be from pulmonary venous hypertension. However, this may be positional as the image was obtained with the patient in the semi erect position.  Borderline enlargement of the cardiopericardial silhouette. Atherosclerotic aortic arch. Borderline elevation of the right hemidiaphragm.  IMPRESSION: 1. Right IJ line tip: SVC. No pneumothorax or complicating feature. 2. Cephalization of blood flow may be due to positioning or could be from pulmonary venous hypertension. 3. Atherosclerotic aortic arch.   Electronically Signed   By: Van Clines M.D.   On: 12/08/2014 17:22    Scheduled Meds: . allopurinol  100 mg Oral Daily  . aspirin EC  81 mg Oral Daily  . heparin  5,000 Units Subcutaneous 3 times per day  . latanoprost  1 drop Both Eyes QHS  . montelukast  10 mg Oral QHS  . omega-3 acid ethyl esters  1 g Oral Daily  . pantoprazole  40 mg Oral Daily  . piperacillin-tazobactam (ZOSYN)  IV  3.375 g Intravenous Q8H  . predniSONE  15 mg Oral Q breakfast   Continuous Infusions:     Time spent: 25 minutes    Aliz Meritt, [place]  [facility] Pager [phone]. If 7PM-7AM, please contact night-coverage at [email or web address], password [facility] 12/10/2014, 11:38 AM  LOS: 2 days

## 2014-12-11 DIAGNOSIS — R6521 Severe sepsis with septic shock: Secondary | ICD-10-CM

## 2014-12-11 DIAGNOSIS — A419 Sepsis, unspecified organism: Principal | ICD-10-CM

## 2014-12-11 DIAGNOSIS — R609 Edema, unspecified: Secondary | ICD-10-CM

## 2014-12-11 DIAGNOSIS — E876 Hypokalemia: Secondary | ICD-10-CM | POA: Diagnosis present

## 2014-12-11 DIAGNOSIS — N179 Acute kidney failure, unspecified: Secondary | ICD-10-CM | POA: Diagnosis present

## 2014-12-11 LAB — CBC
HCT: 38.7 % — ABNORMAL LOW (ref 39.0–52.0)
HEMOGLOBIN: 11.8 g/dL — AB (ref 13.0–17.0)
MCH: 25.1 pg — ABNORMAL LOW (ref 26.0–34.0)
MCHC: 30.5 g/dL (ref 30.0–36.0)
MCV: 82.3 fL (ref 78.0–100.0)
Platelets: 193 10*3/uL (ref 150–400)
RBC: 4.7 MIL/uL (ref 4.22–5.81)
RDW: 15.8 % — ABNORMAL HIGH (ref 11.5–15.5)
WBC: 13.4 10*3/uL — AB (ref 4.0–10.5)

## 2014-12-11 LAB — LACTIC ACID, PLASMA: Lactic Acid, Venous: 2.6 mmol/L (ref 0.5–2.0)

## 2014-12-11 MED ORDER — FUROSEMIDE 20 MG PO TABS: 20.0000 mg | ORAL_TABLET | Freq: Every day | ORAL | Status: DC

## 2014-12-11 MED ORDER — AMOXICILLIN-POT CLAVULANATE 875-125 MG PO TABS: 1.0000 | ORAL_TABLET | Freq: Two times a day (BID) | ORAL | Status: AC

## 2014-12-11 MED ORDER — POTASSIUM CHLORIDE 20 MEQ PO PACK: 20.0000 meq | PACK | Freq: Every day | ORAL | Status: DC

## 2014-12-11 MED ORDER — FUROSEMIDE 10 MG/ML IJ SOLN
40.0000 mg | Freq: Once | INTRAMUSCULAR | Status: AC
  Administered 2014-12-11: 40 mg via INTRAVENOUS
  Filled 2014-12-11: qty 4

## 2014-12-11 NOTE — Discharge Summary (Signed)
Physician Discharge Summary  Rodney Sullivan OJJ:[id number] DOB: Aug 27, 1940 DOA: 12/08/2014  PCP: Dr. Virgel Bouquet at [facility] in [place]  Primary gastroenterologist: At [facility] date: 12/08/2014 Discharge date: 12/11/2014  Time spent: 35 minutes  Recommendations for Outpatient Follow-up:  Discharge home with outpatient PCP follow-up. Has appointment on 12/23/2014. Patient will complete 10 day course of antibiotic  on 12/17/2014.  Discharge Diagnoses:  Principal Problem:   Septic shock   Active Problems:   Crohn disease   Leg Edema   Hypokalemia   Acute kidney injury  Elevated lactate acid   Discharge Condition: Fair  Diet recommendation: Low-sodium  Filed Weights   12/09/14 1812 12/10/14 0515 12/11/14 0520  Weight: 89.54 kg (197 lb 6.4 oz) 89.903 kg (198 lb 3.2 oz) 85.821 kg (189 lb 3.2 oz)    History of present illness:  Please refer to admission H&P for details, in brief, 5 -year-old male with history of Crohn's disease (on daily prednisone, mesalamine and monthly centroluzimab injection, sees gastroenterologist at [facility]), history of DVT/PE in 2015 (was on Xarelto for 3 months), history of severe sepsis due to Escherichia coli bacteremia in June 2015 requiring ICU admission presented to the ED on 12/08/2014 with acute onset of right-sided abdominal pain, nausea with one episode of small amount of vomiting and fever of 102F at home. Patient was afebrile when he arrived to the ED but was hypotensive with systolic blood pressure in the 80s, lactic acid of 6.88 and mildly elevated creatinine 1.34. Patient given 4 L IV normal saline bolus and empiric IV vancomycin and Zosyn and admitted to ICU. CT of the abdomen and pelvis done on admission showed stable chronic fistulous tract between the ileum, proximal duodenum and right-sided colon without bowel obstruction or fluid collection. Patient improved with IV hydration with slow  clearing of lactic acid and transferred to telemetry.  Hospital Course:  Septic shock No clear etiology. Cultures so far negative.  empiric IV antibodies.  final blood cultures negative. Patient admitted in June, 2015 with Escherichia coli bacteremia and sepsis.  Recent currently afebrile and vitals have been stable. now off IV fluids. Heart exam normal without any murmurs and stable on telemetry. Since patient showing remarkable clinical improvement I don't see a role for 2-D echo to rule out any vegetations. I will discharge him on oral Augmentin to complete a total 10 days of antibiotic.  Active problems Acute kidney injury Secondary to septic shock on admission. Now resolved with fluid resuscitation.  Elevated lactic acid Secondary to sepsis. Has improved significantly. Lactic acid upon discharge is 2.6. Needs outpatient follow-up.  bilateral leg edema Likely secondary to aggressive fluid resuscitation. Discontinued IV fluids and given IV Lasix x2. Have asked patient to continue taking home dose Lasix daily until seen by PCP (takes 20 mg as needed for leg swellings). Also instructed to continue potassium supplement with Lasix.  Right upper quadrant abdominal pain on admission History of Crohn's disease. CT abdomen without acute process and shows chronic fistula. No further nausea or vomiting and tolerating diet.  History of Crohn's disease Resumed home medications. Follows with GI at [facility].  Hypokalemia/ hypomagnesemia replenished. Instructed to continue taking daily potassium supplements at home.  Leukocytosis Secondary to sepsis and IV Solu-cortef received since admission. Switch to home dose prednisone. Follow-up as outpatient.     Code Status: Full code Family Communication: Wife at bedside Disposition Plan: Home with outpatient follow-up   Consultants:  PCCM  Procedures:  CT abdomen and pelvis  Antibiotics:  IV vanco/ zosyn 6/2--6/5  Patient  will be discharged on Augmentin until 6/11 to complete a total 10 days course of antibiotic.  Discharge Exam: Filed Vitals:   12/11/14 0520  BP: 112/73  Pulse: 66  Temp: 98.2 F (36.8 C)  Resp: 18    General: Male in no acute distress HEENT: No pallor, right IJ, supple neck, no JVD Cardiovascular: Normal S1 and S2, no murmurs or gallop Respiratory: Fine bibasilar crackles, no rhonchi or wheeze GI: Soft, nondistended, nontender, bowel sounds present Musculoskeletal: Warm, trace bilateral pitting edema CNS: Alert and oriented  Discharge Instructions    Current Discharge Medication List    START taking these medications   Details  amoxicillin-clavulanate (AUGMENTIN) 875-125 MG per tablet Take 1 tablet by mouth 2 (two) times daily. Qty: 14 tablet, Refills: 0    furosemide (LASIX) 20 MG tablet Take 1 tablet (20 mg total) by mouth daily. Qty: 30 tablet, Refills: 0    potassium chloride (KLOR-CON) 20 MEQ packet Take 20 mEq by mouth daily. Qty: 30 tablet, Refills: 0      CONTINUE these medications which have NOT CHANGED   Details  acetaminophen (TYLENOL) 500 MG tablet Take 500 mg by mouth every 6 (six) hours as needed for mild pain.    allopurinol (ZYLOPRIM) 100 MG tablet Take 100 mg by mouth daily.    aspirin EC 81 MG tablet Take 81 mg by mouth daily.    Certolizumab Pegol (CIMZIA Frost) Inject 400 mg into the skin every 28 (twenty-eight) days.     cholecalciferol (VITAMIN D) 1000 UNITS tablet Take 2,000 Units by mouth daily.    clotrimazole-betamethasone (LOTRISONE) cream Apply 1 application topically 2 (two) times daily. On the side of mouth    diphenhydrAMINE (BENADRYL) 25 MG tablet Take 25 mg by mouth 2 (two) times daily.    latanoprost (XALATAN) 0.005 % ophthalmic solution Place 1 drop into both eyes at bedtime.    mesalamine (PENTASA) 500 MG CR capsule Take 2,000 mg by mouth 2 (two) times daily.    montelukast (SINGULAIR) 10 MG tablet Take 10 mg by mouth at  bedtime.    Multiple Vitamin (MULTIVITAMIN WITH MINERALS) TABS Take 1 tablet by mouth daily.    omega-3 acid ethyl esters (LOVAZA) 1 G capsule Take 1 g by mouth daily.     pantoprazole (PROTONIX) 40 MG tablet Take 40 mg by mouth daily.    predniSONE (DELTASONE) 5 MG tablet Take 15 mg by mouth daily with breakfast.     sodium chloride (OCEAN) 0.65 % SOLN nasal spray Place 2 sprays into both nostrils 2 (two) times daily. Refills: 0    traMADol (ULTRAM) 50 MG tablet Take 50 mg by mouth every 6 (six) hours as needed for pain.    vitamin B-12 (CYANOCOBALAMIN) 1000 MCG tablet Take 5,000 mcg by mouth daily.       STOP taking these medications     cefdinir (OMNICEF) 300 MG capsule      Rivaroxaban (XARELTO STARTER PACK) 15 & 20 MG TBPK      rivaroxaban (XARELTO) 20 MG TABS tablet      sulfamethoxazole-trimethoprim (BACTRIM DS,SEPTRA DS) 800-160 MG per tablet        Allergies  Allergen Reactions  . Ciprofloxacin     tendons hurt  . Other Hives    States he is allergic to 2 meds for allergies but can't recall name   Follow-up Information  Follow up with Dr Ferdinand Lango. Go on 12/23/2014.   Contact information:    pcp is Dr Virgel Bouquet [facility]           The results of significant diagnostics from this hospitalization (including imaging, microbiology, ancillary and laboratory) are listed below for reference.    Significant Diagnostic Studies: Dg Chest 2 View  12/08/2014   CLINICAL DATA:  Fever. Right upper quadrant pain and abdominal distention. Crohn's disease.  EXAM: CHEST  2 VIEW  COMPARISON:  Radiographs dated 12/16/2013, 12/14/2013 and 11/27/2013  FINDINGS: Heart size and pulmonary vascularity is normal. Calcification and tortuosity of the thoracic aorta. Chronic elevation of the right hemidiaphragm. The lungs are clear. No effusions. No significant osseous abnormality.  IMPRESSION: No acute disease.   Electronically Signed   By: Lorriane Shire M.D.    On: 12/08/2014 12:31   Ct Abdomen Pelvis W Contrast  12/08/2014   CLINICAL DATA:  Acute right upper quadrant abdominal pain and nausea.  EXAM: CT ABDOMEN AND PELVIS WITH CONTRAST  TECHNIQUE: Multidetector CT imaging of the abdomen and pelvis was performed using the standard protocol following bolus administration of intravenous contrast.  CONTRAST:  169m OMNIPAQUE IOHEXOL 300 MG/ML  SOLN  COMPARISON:  CT scan of December 14, 2013.  FINDINGS: Mild degenerative disc disease is noted at L5-S1. Stable 9 mm right hilar lymph node is noted. No acute pulmonary disease is seen in visualized lung bases.  There is again noted reactive enhancement along the gallbladder fossa within the liver. This is stable compared to prior exam. The spleen and pancreas appear normal. Adrenal glands appear normal. No hydronephrosis or renal obstruction is noted. Stable right renal cysts are noted. Status post cholecystectomy. There is no evidence of bowel obstruction.  Status post partial colectomy. There remains wall thickening and abnormal soft tissue density between the proximal duodenum, colon and distal small bowel consistent with chronic fistula as described on prior exam. This is unchanged. No abnormal fluid collection is noted.  Evaluation of the pelvis is limited due to scatter artifact arising from bilateral hip arthroplasties. There is no evidence of abdominal aortic aneurysm. Visualized portion of urinary bladder appears normal. No significant adenopathy is noted.  IMPRESSION: Stable chronic fistulous tracts are seen between the ileum, proximal duodenum and colon in the right side of the abdomen.  No evidence of bowel obstruction is noted. No abnormal fluid collection is noted.   Electronically Signed   By: JMarijo Conception M.D.   On: 12/08/2014 13:09   Dg Chest Port 1 View  12/08/2014   CLINICAL DATA:  Central line placement.  EXAM: PORTABLE CHEST - 1 VIEW  COMPARISON:  12/08/2014 at 11:24  a.m.  FINDINGS: Right internal  jugular center venous catheter tip: SVC. No pneumothorax.  Low lung volumes are present, causing crowding of the pulmonary vasculature. Bilateral mild interstitial accentuation could be from pulmonary venous hypertension. However, this may be positional as the image was obtained with the patient in the semi erect position.  Borderline enlargement of the cardiopericardial silhouette. Atherosclerotic aortic arch. Borderline elevation of the right hemidiaphragm.  IMPRESSION: 1. Right IJ line tip: SVC. No pneumothorax or complicating feature. 2. Cephalization of blood flow may be due to positioning or could be from pulmonary venous hypertension. 3. Atherosclerotic aortic arch.   Electronically Signed   By: WVan ClinesM.D.   On: 12/08/2014 17:22    Microbiology: Recent Results (from the past 240 hour(s))  Blood culture (  routine x 2)     Status: None (Preliminary result)   Collection Time: 12/08/14 11:19 AM  Result Value Ref Range Status   Specimen Description BLOOD RIGHT ANTECUBITAL  Final   Special Requests BOTTLES DRAWN AEROBIC AND ANAEROBIC 5ML  Final   Culture   Final           BLOOD CULTURE RECEIVED NO GROWTH TO DATE CULTURE WILL BE HELD FOR 5 DAYS BEFORE ISSUING A FINAL NEGATIVE REPORT Performed at [facility]    Report Status PENDING  Incomplete  Blood culture (routine x 2)     Status: None (Preliminary result)   Collection Time: 12/08/14 11:19 AM  Result Value Ref Range Status   Specimen Description BLOOD LEFT ANTECUBITAL  Final   Special Requests BOTTLES DRAWN AEROBIC AND ANAEROBIC 5ML  Final   Culture   Final           BLOOD CULTURE RECEIVED NO GROWTH TO DATE CULTURE WILL BE HELD FOR 5 DAYS BEFORE ISSUING A FINAL NEGATIVE REPORT Performed at [facility]    Report Status PENDING  Incomplete  MRSA PCR Screening     Status: None   Collection Time: 12/08/14  3:23 PM  Result Value Ref Range Status   MRSA by PCR NEGATIVE NEGATIVE Final    Comment:        The  GeneXpert MRSA Assay (FDA approved for NASAL specimens only), is one component of a comprehensive MRSA colonization surveillance program. It is not intended to diagnose MRSA infection nor to guide or monitor treatment for MRSA infections.   Clostridium Difficile by PCR     Status: None   Collection Time: 12/09/14  8:38 PM  Result Value Ref Range Status   C difficile by pcr NEGATIVE NEGATIVE Final     Labs: Basic Metabolic Panel:  Recent Labs Lab 12/08/14 1050 12/09/14 0346 12/10/14 0503  NA 138 141 143  K 3.5 3.8 3.2*  CL 103 112* 111  CO2 20* 19* 21*  GLUCOSE 196* 145* 154*  BUN 15 16 14   CREATININE 1.34* 1.27* 1.11  CALCIUM 8.5* 7.0* 7.9*  MG  --   --  1.7  PHOS  --   --  2.8   Liver Function Tests:  Recent Labs Lab 12/08/14 1050  AST 74*  ALT 47  ALKPHOS 74  BILITOT 0.7  PROT 5.7*  ALBUMIN 3.1*    Recent Labs Lab 12/08/14 1050  LIPASE 42   No results for input(s): AMMONIA in the last 168 hours. CBC:  Recent Labs Lab 12/08/14 1050 12/09/14 0346 12/10/14 0503  WBC 9.9 19.7* 17.7*  NEUTROABS 9.0*  --   --   HGB 11.9* 9.4* 10.5*  HCT 38.7* 30.6* 34.2*  MCV 81.3 83.6 81.2  PLT 176 155 156   Cardiac Enzymes:  Recent Labs Lab 12/08/14 1715  TROPONINI <0.03   BNP: BNP (last 3 results) No results for input(s): BNP in the last 8760 hours.  ProBNP (last 3 results)  Recent Labs  12/14/13 1140  PROBNP 206.2*    CBG: No results for input(s): GLUCAP in the last 168 hours.     SignedLouellen Molder  [facility] 12/11/2014, 9:40 AM

## 2014-12-11 NOTE — Progress Notes (Signed)
Notified of critical lactic level 2.6, which is trending down from 3.1 on 12/10/14. MD aware of previous level no need to notify.

## 2014-12-14 LAB — CULTURE, BLOOD (ROUTINE X 2)
Culture: NO GROWTH
Culture: NO GROWTH

## 2017-07-15 ENCOUNTER — Encounter (HOSPITAL_COMMUNITY): Payer: Self-pay | Admitting: Emergency Medicine

## 2017-07-15 ENCOUNTER — Emergency Department (HOSPITAL_COMMUNITY)
Admission: EM | Admit: 2017-07-15 | Discharge: 2017-07-15 | Disposition: A | Discharge status: Home / Self Care | Payer: Medicare HMO | Attending: Emergency Medicine | Admitting: Emergency Medicine

## 2017-07-15 ENCOUNTER — Other Ambulatory Visit: Payer: Self-pay

## 2017-07-15 ENCOUNTER — Emergency Department (HOSPITAL_COMMUNITY): Payer: Medicare HMO

## 2017-07-15 DIAGNOSIS — Z87891 Personal history of nicotine dependence: Secondary | ICD-10-CM | POA: Diagnosis not present

## 2017-07-15 DIAGNOSIS — Z7982 Long term (current) use of aspirin: Secondary | ICD-10-CM | POA: Diagnosis not present

## 2017-07-15 DIAGNOSIS — L03116 Cellulitis of left lower limb: Secondary | ICD-10-CM

## 2017-07-15 DIAGNOSIS — Z79899 Other long term (current) drug therapy: Secondary | ICD-10-CM | POA: Insufficient documentation

## 2017-07-15 DIAGNOSIS — R0602 Shortness of breath: Secondary | ICD-10-CM | POA: Diagnosis not present

## 2017-07-15 DIAGNOSIS — R2242 Localized swelling, mass and lump, left lower limb: Secondary | ICD-10-CM | POA: Diagnosis present

## 2017-07-15 LAB — BASIC METABOLIC PANEL
ANION GAP: 11 (ref 5–15)
BUN: 24 mg/dL — ABNORMAL HIGH (ref 6–20)
CO2: 24 mmol/L (ref 22–32)
Calcium: 8.9 mg/dL (ref 8.9–10.3)
Chloride: 104 mmol/L (ref 101–111)
Creatinine, Ser: 1.35 mg/dL — ABNORMAL HIGH (ref 0.61–1.24)
GFR calc Af Amer: 58 mL/min — ABNORMAL LOW (ref >60)
GFR, EST NON AFRICAN AMERICAN: 50 mL/min — AB (ref >60)
GLUCOSE: 173 mg/dL — AB (ref 65–99)
POTASSIUM: 3.6 mmol/L (ref 3.5–5.1)
SODIUM: 139 mmol/L (ref 135–145)

## 2017-07-15 LAB — CBC
HCT: 35.6 % — ABNORMAL LOW (ref 39.0–52.0)
HEMOGLOBIN: 10.4 g/dL — AB (ref 13.0–17.0)
MCH: 23.6 pg — ABNORMAL LOW (ref 26.0–34.0)
MCHC: 29.2 g/dL — ABNORMAL LOW (ref 30.0–36.0)
MCV: 80.9 fL (ref 78.0–100.0)
Platelets: 261 10*3/uL (ref 150–400)
RBC: 4.4 MIL/uL (ref 4.22–5.81)
RDW: 16.3 % — ABNORMAL HIGH (ref 11.5–15.5)
WBC: 18.2 10*3/uL — AB (ref 4.0–10.5)

## 2017-07-15 LAB — URINALYSIS, ROUTINE W REFLEX MICROSCOPIC
BILIRUBIN URINE: NEGATIVE
Bacteria, UA: NONE SEEN
Hgb urine dipstick: NEGATIVE
KETONES UR: 20 mg/dL — AB
LEUKOCYTES UA: NEGATIVE
Nitrite: NEGATIVE
PH: 5 (ref 5.0–8.0)
PROTEIN: NEGATIVE mg/dL
Specific Gravity, Urine: 1.038 — ABNORMAL HIGH (ref 1.005–1.030)

## 2017-07-15 LAB — I-STAT TROPONIN, ED: Troponin i, poc: 0.02 ng/mL (ref 0.00–0.08)

## 2017-07-15 LAB — BRAIN NATRIURETIC PEPTIDE: B Natriuretic Peptide: 168.6 pg/mL — ABNORMAL HIGH (ref 0.0–100.0)

## 2017-07-15 MED ORDER — IOPAMIDOL (ISOVUE-370) INJECTION 76%
100.0000 mL | Freq: Once | INTRAVENOUS | Status: AC | PRN
  Administered 2017-07-15: 100 mL via INTRAVENOUS

## 2017-07-15 MED ORDER — IOPAMIDOL (ISOVUE-370) INJECTION 76%
INTRAVENOUS | Status: AC
  Filled 2017-07-15: qty 100

## 2017-07-15 MED ORDER — DEXTROSE 5 % IV SOLN
1.0000 g | Freq: Once | INTRAVENOUS | Status: AC
  Administered 2017-07-15: 1 g via INTRAVENOUS
  Filled 2017-07-15: qty 10

## 2017-07-15 MED ORDER — DOXYCYCLINE HYCLATE 100 MG PO CAPS: 100.0000 mg | ORAL_CAPSULE | Freq: Two times a day (BID) | ORAL | 0 refills | Status: DC

## 2017-07-15 MED ORDER — SODIUM CHLORIDE 0.9 % IJ SOLN
INTRAMUSCULAR | Status: AC
  Filled 2017-07-15: qty 50

## 2017-07-15 NOTE — ED Triage Notes (Signed)
Patient reports that was seen at [facility] today for left leg swelling for week and pain. Patient also breathing harder since last night. So [facility] wants to r/o PE.

## 2017-07-15 NOTE — ED Notes (Signed)
Patient given Biotene mouth rinse since c/o dry mouth and wanting water.

## 2017-07-15 NOTE — ED Notes (Signed)
Patient was sent over from [facility] center where he has been getting evaluated for a blood clot in his LLE. Staff was concerned that DVT may have broke off and lodged in the lungs due to patient presenting with SHOB/tachycardia at their office. Pt has been on Xarelto since October for DVTs. Hx of DVTs.

## 2017-07-15 NOTE — ED Provider Notes (Signed)
  Physical Exam  BP 108/66   Pulse (!) 109   Temp 98.6 F (37 C) (Oral)   Resp 17   Ht 5' 7"  (1.702 m)   Wt 77.1 kg (170 lb)   SpO2 100%   BMI 26.63 kg/m   Physical Exam  ED Course/Procedures   Clinical Course as of Jul 15 1737  Tue Jul 15, 2017  1431 Chest x-ray without obvious findings.  Patient does have risk factors for pulmonary embolism.  Considering symptoms I will order a CT scan  [JK]  1611 D/w Dr Dyann Kief.  He will evaluate the patient in the ED to determine whether it would be warrants admission.  [JK]    Clinical Course User Index [JK] Dorie Rank, MD    Procedures  MDM  Signout from Dr. Tomi Bamberger.  Seen by Dr. Dyann Kief in the ER.  Thinks it is okay for discharge home.  Will discharge with antibiotics.  No DVT.      Davonna Belling, MD 07/15/17 1739

## 2017-07-15 NOTE — ED Provider Notes (Addendum)
[facility] DEPT Provider Note   CSN: [id number] Arrival date & time: 07/15/17  8466     History   Chief Complaint Chief Complaint  Patient presents with  . sent from [facility] to r/o PE  . right leg swelling    HPI Rodney Sullivan is a 77 y.o. male.  HPI Patient presents to the emergency room for evaluation shortness of breath, possible pulmonary embolism.  Patient has a history of DVT and PE.  He is currently on anticoagulation.  Patient states last night he started having trouble with chills and feeling feverish.  He also began coughing and feeling somewhat short of breath.  He went to see his primary care doctor today.  His doctor noted some bruising and swelling in his left lower extremity.  He did fall recently. They were concerned that he may have developed a pulmonary embolism.  He was sent to the emergency room for further evaluation. Past Medical History:  Diagnosis Date  . Bacteremia due to Escherichia coli 11/2013  . Crohn disease ([place])   . DVT (deep venous thrombosis) ([place]) 11/27/13  . Pulmonary embolism ([place]) 11/27/13     Patient Active Problem List   Diagnosis Date Noted  . Edema 12/11/2014  . Hypokalemia 12/11/2014  . Acute kidney injury ([place]) 12/11/2014  . Septic shock ([place]) 12/14/2013  . Pulmonary embolism ([place]) 12/14/2013  . Crohn disease ([place]) 12/14/2013    Past Surgical History:  Procedure Laterality Date  . ABDOMINAL SURGERY     Bowel resection x2  . CHOLECYSTECTOMY    . COLON SURGERY    . HIP SURGERY     BOTH  . JOINT REPLACEMENT    . SHOULDER SURGERY     RIGHT TENDON DETACHED       Home Medications    Prior to Admission medications   Medication Sig Start Date End Date Taking? Authorizing Provider  acetaminophen (TYLENOL) 500 MG tablet Take 500 mg by mouth every 6 (six) hours as needed for mild pain.   Yes [provider]  allopurinol (ZYLOPRIM) 100 MG tablet Take 100 mg by mouth daily.   Yes [provider]  Brimonidine Tartrate-Timolol (COMBIGAN OP) Place 1 application into both eyes 2 (two) times daily.   Yes [provider]  canagliflozin (INVOKANA) 100 MG TABS tablet Take 50 mg by mouth daily before breakfast. Take 1/2 tablet (37m) po daily   Yes [provider]  Certolizumab Pegol (CIMZIA [place]) Inject 400 mg into the skin every 28 (twenty-eight) days.    Yes [provider]  cholecalciferol (VITAMIN D) 1000 UNITS tablet Take 2,000 Units by mouth daily.   Yes [provider]  clotrimazole-betamethasone (LOTRISONE) cream Apply 1 application topically 2 (two) times daily. On the side of mouth 09/05/14  Yes [provider]  DEXAMETHASONE ACETATE IJ Inject 8 mg into the muscle as needed (As needed when Crohn's flair).   Yes [provider]  diphenhydrAMINE (BENADRYL) 25 MG tablet Take 25 mg by mouth at bedtime as needed.    Yes [provider]  Emollient (ZIMS CRACK CREME) CREA Apply 1 application topically daily as needed.   Yes [provider]  latanoprost (XALATAN) 0.005 % ophthalmic solution Place 1 drop into both eyes at bedtime. 11/11/14  Yes [provider]  mesalamine (PENTASA) 500 MG CR capsule Take 2,000 mg by mouth 2 (two) times daily.   Yes [provider]  montelukast (SINGULAIR) 10 MG tablet Take 10 mg  by mouth at bedtime.   Yes [provider]  Multiple Vitamin (MULTIVITAMIN WITH MINERALS) TABS Take 1 tablet by mouth daily.   Yes [provider]  omega-3 acid ethyl esters (LOVAZA) 1 G capsule Take 1 g by mouth daily.    Yes [provider]  pantoprazole (PROTONIX) 40 MG tablet Take 40 mg by mouth daily.   Yes [provider]  predniSONE (DELTASONE) 5 MG tablet Take 25 mg by mouth daily with breakfast.    Yes [provider]  rivaroxaban (XARELTO) 20 MG TABS tablet Take 20 mg by mouth daily with lunch.   Yes [provider]  traMADol  (ULTRAM) 50 MG tablet Take 50 mg by mouth every 6 (six) hours as needed for severe pain.    Yes [provider]  vitamin B-12 (CYANOCOBALAMIN) 1000 MCG tablet Take 5,000 mcg by mouth daily.    Yes [provider]  aspirin EC 81 MG tablet Take 81 mg by mouth daily.    [provider]  furosemide (LASIX) 20 MG tablet Take 1 tablet (20 mg total) by mouth daily. Patient taking differently: Take 20 mg by mouth daily as needed.  12/11/14   Dhungel, Nishant, MD  potassium chloride (KLOR-CON) 20 MEQ packet Take 20 mEq by mouth daily. Patient not taking: Reported on 07/15/2017 12/11/14   Dhungel, Flonnie Overman, MD  sodium chloride (OCEAN) 0.65 % SOLN nasal spray Place 2 sprays into both nostrils 2 (two) times daily. Patient taking differently: Place 2 sprays into both nostrils 2 (two) times daily as needed for congestion (allergies).  12/17/13   Erick Colace, NP    Family History No family history on file.  Social History Social History   Tobacco Use  . Smoking status: Former Smoker    Years: 10.00    Types: Pipe  . Smokeless tobacco: Former Systems developer    Quit date: 12/08/1979  Substance Use Topics  . Alcohol use: No    Alcohol/week: 0.0 oz  . Drug use: No     Allergies   Ciprofloxacin and Other   Review of Systems Review of Systems  All other systems reviewed and are negative.    Physical Exam Updated Vital Signs BP (!) 143/90   Pulse (!) 103   Temp 98.6 F (37 C) (Oral)   Resp (!) 21   Ht 1.702 m (5' 7" )   Wt 77.1 kg (170 lb)   SpO2 98%   BMI 26.63 kg/m   Physical Exam  Constitutional: He appears well-developed and well-nourished. No distress.  HENT:  Head: Normocephalic and atraumatic.  Right Ear: External ear normal.  Left Ear: External ear normal.  Eyes: Conjunctivae are normal. Right eye exhibits no discharge. Left eye exhibits no discharge. No scleral icterus.  Neck: Neck supple. No tracheal deviation present.  Cardiovascular: Normal rate, regular  rhythm and intact distal pulses.  Pulmonary/Chest: Effort normal and breath sounds normal. No stridor. No respiratory distress. He has no wheezes. He has no rales.  Abdominal: Soft. Bowel sounds are normal. He exhibits no distension. There is no tenderness. There is no rebound and no guarding.  Musculoskeletal: He exhibits edema and tenderness.  Ecchymosis in the left lower extremity, mild edema, no calf tenderness, erythema of the lle  Neurological: He is alert. He has normal strength. No cranial nerve deficit (no facial droop, extraocular movements intact, no slurred speech) or sensory deficit. He exhibits normal muscle tone. He displays no seizure activity. Coordination normal.  Skin: Skin  is warm and dry. No rash noted.  Psychiatric: He has a normal mood and affect.  Nursing note and vitals reviewed.    ED Treatments / Results  Labs (all labs ordered are listed, but only abnormal results are displayed) Labs Reviewed  BASIC METABOLIC PANEL - Abnormal; Notable for the following components:      Result Value   Glucose, Bld 173 (*)    BUN 24 (*)    Creatinine, Ser 1.35 (*)    GFR calc non Af Amer 50 (*)    GFR calc Af Amer 58 (*)    All other components within normal limits  CBC - Abnormal; Notable for the following components:   WBC 18.2 (*)    Hemoglobin 10.4 (*)    HCT 35.6 (*)    MCH 23.6 (*)    MCHC 29.2 (*)    RDW 16.3 (*)    All other components within normal limits  BRAIN NATRIURETIC PEPTIDE - Abnormal; Notable for the following components:   B Natriuretic Peptide 168.6 (*)    All other components within normal limits  CULTURE, BLOOD (ROUTINE X 2)  CULTURE, BLOOD (ROUTINE X 2)  URINALYSIS, ROUTINE W REFLEX MICROSCOPIC  I-STAT TROPONIN, ED    EKG  EKG Interpretation  Date/Time:  Tuesday July 15 2017 13:32:13 EST Ventricular Rate:  107 PR Interval:    QRS Duration: 96 QT Interval:  332 QTC Calculation: 443 R Axis:   0 Text Interpretation:  Sinus tachycardia  Anteroseptal infarct, age indeterminate No significant change since last tracing Confirmed by Dorie Rank [phone]) on 07/15/2017 3:43:20 PM       Radiology Dg Chest 2 View  Result Date: 07/15/2017 CLINICAL DATA:  Shortness of breath.  Chills. EXAM: CHEST  2 VIEW COMPARISON:  Chest x-ray dated December 08, 2014. FINDINGS: Stable cardiomegaly. Normal pulmonary vascularity. Persistent elevation of the right hemidiaphragm. Low lung volumes with bibasilar atelectasis. No focal consolidation, pleural effusion, or pneumothorax. No acute osseous abnormality. IMPRESSION: Mild cardiomegaly. Low lung volumes with bibasilar atelectasis. No active disease. Electronically Signed   By: Titus Dubin M.D.   On: 07/15/2017 14:08   Ct Angio Chest Pe W And/or Wo Contrast  Result Date: 07/15/2017 CLINICAL DATA:  Bilateral leg swelling. Labored breathing starting last night. Prior pulmonary embolus shown on CT scan from 11/27/2013 EXAM: CT ANGIOGRAPHY CHEST WITH CONTRAST TECHNIQUE: Multidetector CT imaging of the chest was performed using the standard protocol during bolus administration of intravenous contrast. Multiplanar CT image reconstructions and MIPs were obtained to evaluate the vascular anatomy. CONTRAST:  14m ISOVUE-370 IOPAMIDOL (ISOVUE-370) INJECTION 76% COMPARISON:  Multiple exams, including CT chest 11/27/2013 FINDINGS: Despite efforts by the technologist and patient, motion artifact is present on today's exam and could not be eliminated. This reduces exam sensitivity and specificity. Cardiovascular: No filling defect is identified in the pulmonary arterial tree to suggest pulmonary embolus. Atherosclerotic calcification of the aortic arch and branch vessels. Upper normal heart size. Mediastinum/Nodes: Prominent mediastinal adipose tissue. No pathologic adenopathy in the chest identified. Lungs/Pleura: Old granulomatous disease noted. Upper Abdomen: Unremarkable Musculoskeletal: Thoracic spondylosis. Review of the MIP  images confirms the above findings. IMPRESSION: 1. No filling defect is identified in the pulmonary arterial tree to suggest pulmonary embolus. 2. Other imaging findings of potential clinical significance: Aortic Atherosclerosis (ICD10-I70.0). Old granulomatous disease. Thoracic spondylosis. Mild prominence of mediastinal adipose tissues. Electronically Signed   By: WVan ClinesM.D.   On: 07/15/2017 15:28    Procedures Procedures (including  critical care time)  Medications Ordered in ED Medications  sodium chloride 0.9 % injection (not administered)  iopamidol (ISOVUE-370) 76 % injection (not administered)  cefTRIAXone (ROCEPHIN) 1 g in dextrose 5 % 50 mL IVPB (not administered)  iopamidol (ISOVUE-370) 76 % injection 100 mL (100 mLs Intravenous Contrast Given 07/15/17 1453)     Initial Impression / Assessment and Plan / ED Course  I have reviewed the triage vital signs and the nursing notes.  Pertinent labs & imaging results that were available during my care of the patient were reviewed by me and considered in my medical decision making (see chart for details).  Clinical Course as of Jul 16 1611  Tue Jul 15, 2017  1431 Chest x-ray without obvious findings.  Patient does have risk factors for pulmonary embolism.  Considering symptoms I will order a CT scan  [JK]  1611 D/w Dr Dyann Kief.  He will evaluate the patient in the ED to determine whether it would be warrants admission.  [JK]    Clinical Course User Index [JK] Dorie Rank, MD   Patient presented to the emergency room for symptoms concerning for fever and infection.  His primary care doctor was worried about the possibility of a pulmonary embolism so he was sent to the emergency room.  CT scan does not show evidence of PE.  His laboratory tests do show a leukocytosis.  On exam, the patient's left lower extremity appears to have some ecchymoses but also erythema.  He is having increasing pain in his left leg.  He is already on  Xarelto.  I doubt DVT.  I am suspicious for cellulitis.  Pt is still feeling weak.  I will consult the medical service for admission.  Final Clinical Impressions(s) / ED Diagnoses   Final diagnoses:  Cellulitis of left lower extremity      Dorie Rank, MD 07/15/17 1557    Dorie Rank, MD 07/15/17 [phone]

## 2017-07-15 NOTE — Discharge Instructions (Signed)
Follow-up with your doctor.  Return for worsening symptoms or increasing redness or swelling.

## 2017-07-20 LAB — CULTURE, BLOOD (ROUTINE X 2)
CULTURE: NO GROWTH
CULTURE: NO GROWTH
SPECIAL REQUESTS: ADEQUATE
Special Requests: ADEQUATE

## 2017-09-15 ENCOUNTER — Other Ambulatory Visit (HOSPITAL_COMMUNITY): Payer: Self-pay | Admitting: Orthopedic Surgery

## 2017-09-15 ENCOUNTER — Ambulatory Visit (HOSPITAL_COMMUNITY)
Admission: RE | Admit: 2017-09-15 | Discharge: 2017-09-15 | Disposition: A | Discharge status: Home / Self Care | Payer: Medicare HMO | Source: Ambulatory Visit | Attending: Orthopedic Surgery | Admitting: Orthopedic Surgery

## 2017-09-15 DIAGNOSIS — M7989 Other specified soft tissue disorders: Secondary | ICD-10-CM

## 2017-09-15 DIAGNOSIS — M79604 Pain in right leg: Secondary | ICD-10-CM

## 2017-09-15 DIAGNOSIS — I82442 Acute embolism and thrombosis of left tibial vein: Secondary | ICD-10-CM | POA: Insufficient documentation

## 2017-09-15 DIAGNOSIS — M79662 Pain in left lower leg: Secondary | ICD-10-CM | POA: Diagnosis present

## 2017-09-15 NOTE — Progress Notes (Signed)
Left lower extremity venous duplex completed. Positive for a small area of the posterior tibial vein coursing from the ankle to distal calf. Unable to visualize the peroneal vein well enough to fully evaluate. [facility]. RVS 09/15/2017 3:34 PM

## 2018-12-03 ENCOUNTER — Other Ambulatory Visit: Payer: Self-pay

## 2018-12-03 DIAGNOSIS — I742 Embolism and thrombosis of arteries of the upper extremities: Secondary | ICD-10-CM

## 2018-12-03 DIAGNOSIS — M79604 Pain in right leg: Secondary | ICD-10-CM

## 2018-12-04 ENCOUNTER — Encounter: Payer: Self-pay | Admitting: Vascular Surgery

## 2018-12-04 ENCOUNTER — Ambulatory Visit (INDEPENDENT_AMBULATORY_CARE_PROVIDER_SITE_OTHER): Payer: Medicare HMO | Admitting: Vascular Surgery

## 2018-12-04 ENCOUNTER — Other Ambulatory Visit: Payer: Self-pay

## 2018-12-04 ENCOUNTER — Other Ambulatory Visit: Payer: Self-pay | Admitting: *Deleted

## 2018-12-04 ENCOUNTER — Ambulatory Visit (HOSPITAL_COMMUNITY)
Admission: RE | Admit: 2018-12-04 | Discharge: 2018-12-04 | Disposition: A | Discharge status: Home / Self Care | Payer: Medicare HMO | Source: Ambulatory Visit | Attending: Family | Admitting: Family

## 2018-12-04 VITALS — BP 113/70 | HR 79 | Temp 97.9°F | Resp 20 | Ht 67.0 in | Wt 164.0 lb

## 2018-12-04 DIAGNOSIS — I739 Peripheral vascular disease, unspecified: Secondary | ICD-10-CM | POA: Diagnosis not present

## 2018-12-04 DIAGNOSIS — I742 Embolism and thrombosis of arteries of the upper extremities: Secondary | ICD-10-CM

## 2018-12-04 NOTE — Progress Notes (Signed)
Patient ID: Rodney Sullivan, male   DOB: 12/24/1940, 78 y.o.   MRN: [id number]  Reason for Consult: New Patient (Initial Visit)   Referred by Secundino Ginger, PA-C  Subjective:     HPI:  Rodney Sullivan is a 78 y.o. male sent for evaluation of right middle finger gangrenous changes.  This is been present for several weeks.  He does not have any vascular events in the past but is also developed a wound on his right third toe more recently.  Does have diabetes.  Does aspirin does not take a statin drug at this time.  He is a former pipe smoker.  Has history of DVT not currently on blood thinners.  He has seen a dermatologist and was prescribed steroid cream.  Past Medical History:  Diagnosis Date  . Bacteremia due to Escherichia coli 11/2013  . Crohn disease ([place])   . DVT (deep venous thrombosis) ([place]) 11/27/13  . Pulmonary embolism ([place]) 11/27/13    History reviewed. No pertinent family history. Past Surgical History:  Procedure Laterality Date  . ABDOMINAL SURGERY     Bowel resection x2  . CHOLECYSTECTOMY    . COLON SURGERY    . HIP SURGERY     BOTH  . JOINT REPLACEMENT    . SHOULDER SURGERY     RIGHT TENDON DETACHED    Short Social History:  Social History   Tobacco Use  . Smoking status: Former Smoker    Years: 10.00    Types: Pipe  . Smokeless tobacco: Former Systems developer    Quit date: 12/08/1979  Substance Use Topics  . Alcohol use: No    Alcohol/week: 0.0 standard drinks    Allergies  Allergen Reactions  . Ciprofloxacin Other (See Comments)    tendons hurt  . Other Hives    States he is allergic to 2 meds for allergies but can't recall name    Current Outpatient Medications  Medication Sig Dispense Refill  . acetaminophen (TYLENOL) 500 MG tablet Take 500 mg by mouth every 6 (six) hours as needed for mild pain.    Marland Kitchen albuterol (VENTOLIN HFA) 108 (90 Base) MCG/ACT inhaler     . allopurinol (ZYLOPRIM) 100 MG tablet Take 100 mg by mouth daily.    Marland Kitchen amLODipine (NORVASC)  2.5 MG tablet     . aspirin EC 81 MG tablet Take 81 mg by mouth daily.    . Brimonidine Tartrate-Timolol (COMBIGAN OP) Place 1 application into both eyes 2 (two) times daily.    . Certolizumab Pegol 2 X 200 MG/ML KIT Inject 400 mg into the skin every 28 (twenty-eight) days.    . cholecalciferol (VITAMIN D) 1000 UNITS tablet Take 2,000 Units by mouth daily.    . clotrimazole-betamethasone (LOTRISONE) cream Apply 1 application topically 2 (two) times daily. On the side of mouth    . Dexamethasone Acetate 8 MG/ML SUSP Inject 8 mg into the muscle daily as needed (for Crohn's flare).    . diphenhydrAMINE (BENADRYL) 25 MG tablet Take 25 mg by mouth at bedtime as needed for sleep.     Marland Kitchen doxycycline (VIBRAMYCIN) 100 MG capsule Take 1 capsule (100 mg total) by mouth 2 (two) times daily. 20 capsule 0  . Emollient (ZIMS CRACK CREME) CREA Apply 1 application topically daily as needed (dry skin).     Marland Kitchen empagliflozin (JARDIANCE) 10 MG TABS tablet Take 10 mg by mouth daily.    . famciclovir (FAMVIR) 250 MG tablet     .  latanoprost (XALATAN) 0.005 % ophthalmic solution Place 1 drop into both eyes at bedtime.    . mesalamine (PENTASA) 500 MG CR capsule Take 2,000 mg by mouth 2 (two) times daily.    . Multiple Vitamin (MULTIVITAMIN WITH MINERALS) TABS Take 1 tablet by mouth daily.    Marland Kitchen omega-3 acid ethyl esters (LOVAZA) 1 G capsule Take 1 g by mouth daily.     . pantoprazole (PROTONIX) 40 MG tablet Take 40 mg by mouth daily.    . predniSONE (DELTASONE) 5 MG tablet Take 25 mg by mouth daily with breakfast.     . sodium chloride (OCEAN) 0.65 % SOLN nasal spray Place 2 sprays into both nostrils 2 (two) times daily. (Patient taking differently: Place 2 sprays into both nostrils 2 (two) times daily as needed for congestion (allergies). )  0  . traMADol (ULTRAM) 50 MG tablet Take 50 mg by mouth every 6 (six) hours as needed for severe pain.     . vitamin B-12 (CYANOCOBALAMIN) 1000 MCG tablet Take 5,000 mcg by mouth  daily.      No current facility-administered medications for this visit.     Review of Systems  Constitutional:  Constitutional negative. HENT: HENT negative.  Eyes: Eyes negative.  Respiratory: Respiratory negative.  Cardiovascular: Cardiovascular negative.  GI: Gastrointestinal negative.  Musculoskeletal: Musculoskeletal negative.  Skin: Positive for wound.  Neurological: Neurological negative. Hematologic: Hematologic/lymphatic negative.  Psychiatric: Psychiatric negative.        Objective:  Objective   Vitals:   12/04/18 1155  BP: 113/70  Pulse: 79  Resp: 20  Temp: 97.9 F (36.6 C)  SpO2: 95%  Weight: 164 lb (74.4 kg)  Height: 5' 7" (1.702 m)   Body mass index is 25.69 kg/m.  Physical Exam Constitutional:      Appearance: Normal appearance.  HENT:     Head: Normocephalic.     Nose: Nose normal.     Mouth/Throat:     Mouth: Mucous membranes are dry.  Eyes:     Pupils: Pupils are equal, round, and reactive to light.  Neck:     Musculoskeletal: Neck supple.  Cardiovascular:     Rate and Rhythm: Regular rhythm.     Pulses:          Radial pulses are 0 on the right side and 2+ on the left side.       Femoral pulses are 2+ on the right side.      Dorsalis pedis pulses are detected w/ Doppler on the right side.       Posterior tibial pulses are detected w/ Doppler on the right side.     Comments: There is a radial artery signal at the wrist.  There is no ulnar artery signal at the wrist.  Very weak palmar arch signal Pulmonary:     Effort: Pulmonary effort is normal.     Breath sounds: Normal breath sounds.  Abdominal:     General: Abdomen is flat.     Palpations: Abdomen is soft.  Musculoskeletal:     Right lower leg: No edema.     Left lower leg: No edema.     Comments: Right middle finger has distal ischemic ulceration.  Right third toe also has distal ischemic ulceration  Skin:    Capillary Refill: Capillary refill takes 2 to 3 seconds.   Neurological:     Mental Status: He is alert.  Psychiatric:        Mood and Affect: Mood normal.  Behavior: Behavior normal.        Thought Content: Thought content normal.        Judgment: Judgment normal.     Data: I have independently interpreted his right upper extremity duplex which demonstrates occluded ulnar artery on the right and biphasic radial artery signal with calcific plaque proximally     Assessment/Plan:     78 year old male with ischemic ulceration of his right middle finger.  I discussed that this is digit threatening.  I reviewed the previous CT angios he had for PE I do not see any abnormalities of subclavian artery including aberrant subclavian or aneurysmal disease to suggest any embolic phenomenon from proximal.  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  Patient also has ulcer involving toes on his right foot.  We will get him scheduled for right upper extremity angiogram from a left common femoral approach.  If minimal intervention and contrast is needed we can also perform possible intervention of the right lower extremity at the same time.  This time I do not think the toe ulcer is causing concern for amputation but the finger is certainly concerning and he demonstrates good understanding of this.  Conversation was held in the presence of his wife on speaker phone.  If vascular intervention is undertaken will need to start him on statin.     Waynetta Sandy MD [facility]

## 2018-12-14 ENCOUNTER — Other Ambulatory Visit (HOSPITAL_COMMUNITY)
Admission: RE | Admit: 2018-12-14 | Discharge: 2018-12-14 | Disposition: A | Discharge status: Home / Self Care | Payer: Medicare HMO | Source: Ambulatory Visit | Attending: Vascular Surgery | Admitting: Vascular Surgery

## 2018-12-14 ENCOUNTER — Ambulatory Visit (HOSPITAL_COMMUNITY)
Admission: RE | Admit: 2018-12-14 | Discharge: 2018-12-14 | Disposition: A | Discharge status: Home / Self Care | Payer: Medicare HMO | Attending: Vascular Surgery | Admitting: Vascular Surgery

## 2018-12-14 ENCOUNTER — Encounter (HOSPITAL_COMMUNITY): Payer: Self-pay | Admitting: Vascular Surgery

## 2018-12-14 ENCOUNTER — Encounter (HOSPITAL_COMMUNITY)
Admission: RE | Disposition: A | Discharge status: Home / Self Care | Payer: Self-pay | Source: Home / Self Care | Attending: Vascular Surgery

## 2018-12-14 ENCOUNTER — Other Ambulatory Visit: Payer: Self-pay

## 2018-12-14 DIAGNOSIS — Z7982 Long term (current) use of aspirin: Secondary | ICD-10-CM | POA: Insufficient documentation

## 2018-12-14 DIAGNOSIS — L97519 Non-pressure chronic ulcer of other part of right foot with unspecified severity: Secondary | ICD-10-CM | POA: Diagnosis not present

## 2018-12-14 DIAGNOSIS — Z8619 Personal history of other infectious and parasitic diseases: Secondary | ICD-10-CM | POA: Diagnosis not present

## 2018-12-14 DIAGNOSIS — L98499 Non-pressure chronic ulcer of skin of other sites with unspecified severity: Secondary | ICD-10-CM | POA: Insufficient documentation

## 2018-12-14 DIAGNOSIS — Z1159 Encounter for screening for other viral diseases: Secondary | ICD-10-CM | POA: Insufficient documentation

## 2018-12-14 DIAGNOSIS — I70235 Atherosclerosis of native arteries of right leg with ulceration of other part of foot: Secondary | ICD-10-CM | POA: Insufficient documentation

## 2018-12-14 DIAGNOSIS — Z87891 Personal history of nicotine dependence: Secondary | ICD-10-CM | POA: Diagnosis not present

## 2018-12-14 DIAGNOSIS — I70268 Atherosclerosis of native arteries of extremities with gangrene, other extremity: Secondary | ICD-10-CM | POA: Diagnosis not present

## 2018-12-14 DIAGNOSIS — Z79899 Other long term (current) drug therapy: Secondary | ICD-10-CM | POA: Diagnosis not present

## 2018-12-14 DIAGNOSIS — Z9660 Presence of unspecified orthopedic joint implant: Secondary | ICD-10-CM | POA: Diagnosis not present

## 2018-12-14 DIAGNOSIS — Z86718 Personal history of other venous thrombosis and embolism: Secondary | ICD-10-CM | POA: Insufficient documentation

## 2018-12-14 DIAGNOSIS — E11621 Type 2 diabetes mellitus with foot ulcer: Secondary | ICD-10-CM | POA: Diagnosis not present

## 2018-12-14 DIAGNOSIS — Z881 Allergy status to other antibiotic agents status: Secondary | ICD-10-CM | POA: Diagnosis not present

## 2018-12-14 DIAGNOSIS — Z86711 Personal history of pulmonary embolism: Secondary | ICD-10-CM | POA: Insufficient documentation

## 2018-12-14 DIAGNOSIS — K5090 Crohn's disease, unspecified, without complications: Secondary | ICD-10-CM | POA: Diagnosis not present

## 2018-12-14 DIAGNOSIS — Z888 Allergy status to other drugs, medicaments and biological substances status: Secondary | ICD-10-CM | POA: Insufficient documentation

## 2018-12-14 HISTORY — PX: LOWER EXTREMITY ANGIOGRAPHY: CATH118251

## 2018-12-14 HISTORY — PX: UPPER EXTREMITY ANGIOGRAPHY: CATH118270

## 2018-12-14 HISTORY — PX: ABDOMINAL AORTOGRAM: CATH118222

## 2018-12-14 LAB — POCT I-STAT, CHEM 8
BUN: 19 mg/dL (ref 8–23)
Calcium, Ion: 1.18 mmol/L (ref 1.15–1.40)
Chloride: 107 mmol/L (ref 98–111)
Creatinine, Ser: 0.9 mg/dL (ref 0.61–1.24)
Glucose, Bld: 104 mg/dL — ABNORMAL HIGH (ref 70–99)
HCT: 44 % (ref 39.0–52.0)
Hemoglobin: 15 g/dL (ref 13.0–17.0)
Potassium: 3.7 mmol/L (ref 3.5–5.1)
Sodium: 140 mmol/L (ref 135–145)
TCO2: 27 mmol/L (ref 22–32)

## 2018-12-14 LAB — SARS CORONAVIRUS 2 BY RT PCR (HOSPITAL ORDER, PERFORMED IN ~~LOC~~ HOSPITAL LAB): SARS Coronavirus 2: NEGATIVE

## 2018-12-14 SURGERY — UPPER EXTREMITY ANGIOGRAPHY
Anesthesia: LOCAL | Laterality: Right

## 2018-12-14 MED ORDER — SODIUM CHLORIDE 0.9 % IV SOLN: INTRAVENOUS | Status: DC

## 2018-12-14 MED ORDER — SODIUM CHLORIDE 0.9% FLUSH: 3.0000 mL | Freq: Two times a day (BID) | INTRAVENOUS | Status: DC

## 2018-12-14 MED ORDER — SODIUM CHLORIDE 0.9 % IV SOLN: 250.0000 mL | INTRAVENOUS | Status: DC | PRN

## 2018-12-14 MED ORDER — MIDAZOLAM HCL 2 MG/2ML IJ SOLN
INTRAMUSCULAR | Status: AC
  Filled 2018-12-14: qty 2

## 2018-12-14 MED ORDER — FENTANYL CITRATE (PF) 100 MCG/2ML IJ SOLN
INTRAMUSCULAR | Status: DC | PRN
  Administered 2018-12-14: 25 ug via INTRAVENOUS

## 2018-12-14 MED ORDER — HEPARIN SODIUM (PORCINE) 1000 UNIT/ML IJ SOLN
INTRAMUSCULAR | Status: DC | PRN
  Administered 2018-12-14: 3000 [IU] via INTRAVENOUS

## 2018-12-14 MED ORDER — ACETAMINOPHEN 325 MG PO TABS: 650.0000 mg | ORAL_TABLET | ORAL | Status: DC | PRN

## 2018-12-14 MED ORDER — LIDOCAINE HCL (PF) 1 % IJ SOLN
INTRAMUSCULAR | Status: AC
  Filled 2018-12-14: qty 30

## 2018-12-14 MED ORDER — SODIUM CHLORIDE 0.9% FLUSH: 3.0000 mL | INTRAVENOUS | Status: DC | PRN

## 2018-12-14 MED ORDER — FENTANYL CITRATE (PF) 100 MCG/2ML IJ SOLN
INTRAMUSCULAR | Status: AC
  Filled 2018-12-14: qty 2

## 2018-12-14 MED ORDER — HYDRALAZINE HCL 20 MG/ML IJ SOLN: 5.0000 mg | INTRAMUSCULAR | Status: DC | PRN

## 2018-12-14 MED ORDER — MIDAZOLAM HCL 2 MG/2ML IJ SOLN
INTRAMUSCULAR | Status: DC | PRN
  Administered 2018-12-14: 1 mg via INTRAVENOUS

## 2018-12-14 MED ORDER — HEPARIN (PORCINE) IN NACL 1000-0.9 UT/500ML-% IV SOLN
INTRAVENOUS | Status: AC
  Filled 2018-12-14: qty 500

## 2018-12-14 MED ORDER — OXYCODONE HCL 5 MG PO TABS: 5.0000 mg | ORAL_TABLET | ORAL | Status: DC | PRN

## 2018-12-14 MED ORDER — LABETALOL HCL 5 MG/ML IV SOLN: 10.0000 mg | INTRAVENOUS | Status: DC | PRN

## 2018-12-14 MED ORDER — HEPARIN (PORCINE) IN NACL 1000-0.9 UT/500ML-% IV SOLN
INTRAVENOUS | Status: DC | PRN
  Administered 2018-12-14 (×2): 500 mL

## 2018-12-14 MED ORDER — IODIXANOL 320 MG/ML IV SOLN
INTRAVENOUS | Status: DC | PRN
  Administered 2018-12-14: 140 mL via INTRA_ARTERIAL

## 2018-12-14 MED ORDER — LIDOCAINE HCL (PF) 1 % IJ SOLN
INTRAMUSCULAR | Status: DC | PRN
  Administered 2018-12-14: 15 mL

## 2018-12-14 MED ORDER — ONDANSETRON HCL 4 MG/2ML IJ SOLN: 4.0000 mg | Freq: Four times a day (QID) | INTRAMUSCULAR | Status: DC | PRN

## 2018-12-14 MED ORDER — SODIUM CHLORIDE 0.9 % IV SOLN
INTRAVENOUS | Status: DC
  Administered 2018-12-14: 11:00:00 via INTRAVENOUS

## 2018-12-14 MED ORDER — MORPHINE SULFATE (PF) 10 MG/ML IV SOLN: 2.0000 mg | INTRAVENOUS | Status: DC | PRN

## 2018-12-14 SURGICAL SUPPLY — 13 items
CATH ANGIO 5F BER2 100CM (CATHETERS) ×3 IMPLANT
CATH ANGIO 5F PIGTAIL 100CM (CATHETERS) ×3 IMPLANT
CATH OMNI FLUSH 5F 65CM (CATHETERS) ×3 IMPLANT
CLOSURE MYNX CONTROL 5F (Vascular Products) ×3 IMPLANT
GLIDEWIRE ADV .035X260CM (WIRE) ×6 IMPLANT
KIT MICROPUNCTURE NIT STIFF (SHEATH) ×3 IMPLANT
KIT PV (KITS) ×3 IMPLANT
SHEATH PINNACLE 5F 10CM (SHEATH) ×3 IMPLANT
SHEATH PROBE COVER 6X72 (BAG) ×3 IMPLANT
SYR MEDRAD MARK V 150ML (SYRINGE) ×3 IMPLANT
TRANSDUCER W/STOPCOCK (MISCELLANEOUS) ×3 IMPLANT
TRAY PV CATH (CUSTOM PROCEDURE TRAY) ×3 IMPLANT
WIRE BENTSON .035X145CM (WIRE) ×3 IMPLANT

## 2018-12-14 NOTE — Discharge Instructions (Signed)
Femoral Site Care °This sheet gives you information about how to care for yourself after your procedure. Your health care provider may also give you more specific instructions. If you have problems or questions, contact your health care provider. °What can I expect after the procedure? °After the procedure, it is common to have: °· Bruising that usually fades within 1-2 weeks. °· Tenderness at the site. °Follow these instructions at home: °Wound care °· Follow instructions from your health care provider about how to take care of your insertion site. Make sure you: °? Wash your hands with soap and water before you change your bandage (dressing). If soap and water are not available, use hand sanitizer. °? Change your dressing as told by your health care provider. °? Leave stitches (sutures), skin glue, or adhesive strips in place. These skin closures may need to stay in place for 2 weeks or longer. If adhesive strip edges start to loosen and curl up, you may trim the loose edges. Do not remove adhesive strips completely unless your health care provider tells you to do that. °· Do not take baths, swim, or use a hot tub until your health care provider approves. °· You may shower 24-48 hours after the procedure or as told by your health care provider. °? Gently wash the site with plain soap and water. °? Pat the area dry with a clean towel. °? Do not rub the site. This may cause bleeding. °· Do not apply powder or lotion to the site. Keep the site clean and dry. °· Check your femoral site every day for signs of infection. Check for: °? Redness, swelling, or pain. °? Fluid or blood. °? Warmth. °? Pus or a bad smell. °Activity °· For the first 2-3 days after your procedure, or as long as directed: °? Avoid climbing stairs as much as possible. °? Do not squat. °· Do not lift anything that is heavier than 10 lb (4.5 kg), or the limit that you are told, until your health care provider says that it is safe. °· Rest as  directed. °? Avoid sitting for a long time without moving. Get up to take short walks every 1-2 hours. °· Do not drive for 24 hours if you were given a medicine to help you relax (sedative). °General instructions °· Take over-the-counter and prescription medicines only as told by your health care provider. °· Keep all follow-up visits as told by your health care provider. This is important. °Contact a health care provider if you have: °· A fever or chills. °· You have redness, swelling, or pain around your insertion site. °Get help right away if: °· The catheter insertion area swells very fast. °· You pass out. °· You suddenly start to sweat or your skin gets clammy. °· The catheter insertion area is bleeding, and the bleeding does not stop when you hold steady pressure on the area. °· The area near or just beyond the catheter insertion site becomes pale, cool, tingly, or numb. °These symptoms may represent a serious problem that is an emergency. Do not wait to see if the symptoms will go away. Get medical help right away. Call your local emergency services (911 in the [place] Do not drive yourself to the hospital. °Summary °· After the procedure, it is common to have bruising that usually fades within 1-2 weeks. °· Check your femoral site every day for signs of infection. °· Do not lift anything that is heavier than 10 lb (4.5 kg), or the   limit that you are told, until your health care provider says that it is safe. °This information is not intended to replace advice given to you by your health care provider. Make sure you discuss any questions you have with your health care provider. °Document Released: 02/25/2014 Document Revised: 07/07/2017 Document Reviewed: 07/07/2017 °Elsevier Interactive Patient Education © 2019 [employer] ° °

## 2018-12-14 NOTE — H&P (Signed)
   History and Physical Update  The patient was interviewed and re-examined.  The patient's previous History and Physical has been reviewed and is unchanged from recent office visit. Plan for right upper extremity angiogram and possible intervention. Will also need evaluation of right lower extremity.  Brandon C. Donzetta Matters, MD [facility] [place] Office: [phone] Pager: [phone]  12/14/2018, 10:12 AM

## 2018-12-14 NOTE — Op Note (Signed)
    Patient name: Rodney Sullivan MRN: [id number] DOB: Aug 08, 1940 Sex: male  12/14/2018 Pre-operative Diagnosis: Right middle finger ulceration, right second toe ulceration Post-operative diagnosis:  Same Surgeon:  Eda Paschal. Donzetta Matters, MD Procedure Performed: 1.  Ultrasound-guided cannulation left common femoral artery 2.  Arch aortogram 3.  Selection of right subclavian artery and right upper extremity angiogram 4.  Abdominal aortogram 5.  Selection of right common femoral artery and right lower extremity angiogram 6.  Minx device closure left common femoral artery 7.  Moderate sedation with fentanyl and Versed for 44 minutes   Indications: 78 year old male presented to the office with right middle finger ischemia.  He also had an ulcer on the right second toe.  He is indicated for right upper extremity angiogram possible lower extremity angiogram.  Findings: The arch was type I.  There were no flow-limiting stenosis in the right upper extremity.  Ulnar artery is occluded there is an interosseous and a heavily calcified radial which fills the arch and the digital arteries also fill.  Flow is very sluggish likely from delayed ejection fraction.  The abdominal aorta and iliac arteries are patent without flow-limiting stenosis.  There appears to be no flow-limiting stenosis down his entire right lower extremity with the posterior tibial artery being the dominant vessel.  Again flow is sluggish likely from a central issue.  No intervention was undertaken.  We will refer patient to hand surgery for consideration of digital amputation as well as follow him up in the office for his right lower extremity wound.  There is no need for vascular intervention.   Procedure:  The patient was identified in the holding area and taken to room 8.  The patient was then placed supine on the table and prepped and draped in the usual sterile fashion.  A time out was called.  Ultrasound was used to evaluate the left common  femoral artery which was noted to be patent.  The area was anesthetized 1% lidocaine cannulated with direct ultrasound visualization with micropuncture needle followed by wire and sheath.  Bentson wire was placed followed by 5 [place] sheath.  Patient was given moderate sedation with fentanyl and Versed throughout this case and his heart rate, blood pressure, pulse oximeter was monitored continuously.  Pig catheter was placed to the arch of the aorta and 3000 units of heparin were given.  Arch aortogram was performed with above findings.  Bare catheter and Glidewire advantage were used to select the right subclavian artery and right upper extremity angiogram was performed.  No intervention was undertaken.  We then exchanged over the Glidewire advantage for Omni catheter and abdominal aortogram was performed.  We then crossed the bifurcation to the level of the common femoral artery and the right and right lower extremity angiogram was performed.  No intervention was undertaken.  Minx device was deployed after catheter wire removed.  He tolerated procedure without immediate complication.  Contrast: 140cc  Mauriana Dann C. Donzetta Matters, MD [facility] Office: [phone] Pager: [phone]

## 2018-12-18 ENCOUNTER — Encounter (HOSPITAL_BASED_OUTPATIENT_CLINIC_OR_DEPARTMENT_OTHER): Payer: Self-pay | Admitting: *Deleted

## 2018-12-18 ENCOUNTER — Other Ambulatory Visit: Payer: Self-pay

## 2018-12-18 ENCOUNTER — Other Ambulatory Visit: Payer: Self-pay | Admitting: Orthopedic Surgery

## 2018-12-18 ENCOUNTER — Other Ambulatory Visit (HOSPITAL_COMMUNITY)
Admission: RE | Admit: 2018-12-18 | Discharge: 2018-12-18 | Disposition: A | Discharge status: Home / Self Care | Payer: Medicare HMO | Source: Ambulatory Visit | Attending: Orthopedic Surgery | Admitting: Orthopedic Surgery

## 2018-12-18 ENCOUNTER — Encounter (HOSPITAL_BASED_OUTPATIENT_CLINIC_OR_DEPARTMENT_OTHER)
Admission: RE | Admit: 2018-12-18 | Discharge: 2018-12-18 | Disposition: A | Discharge status: Home / Self Care | Payer: Medicare HMO | Source: Ambulatory Visit | Attending: Orthopedic Surgery | Admitting: Orthopedic Surgery

## 2018-12-18 DIAGNOSIS — Z881 Allergy status to other antibiotic agents status: Secondary | ICD-10-CM | POA: Diagnosis not present

## 2018-12-18 DIAGNOSIS — Z87891 Personal history of nicotine dependence: Secondary | ICD-10-CM | POA: Diagnosis not present

## 2018-12-18 DIAGNOSIS — Z01812 Encounter for preprocedural laboratory examination: Secondary | ICD-10-CM | POA: Insufficient documentation

## 2018-12-18 DIAGNOSIS — Z1159 Encounter for screening for other viral diseases: Secondary | ICD-10-CM | POA: Diagnosis not present

## 2018-12-18 DIAGNOSIS — I96 Gangrene, not elsewhere classified: Secondary | ICD-10-CM | POA: Diagnosis not present

## 2018-12-18 DIAGNOSIS — K5090 Crohn's disease, unspecified, without complications: Secondary | ICD-10-CM | POA: Diagnosis not present

## 2018-12-18 DIAGNOSIS — E119 Type 2 diabetes mellitus without complications: Secondary | ICD-10-CM | POA: Diagnosis not present

## 2018-12-18 DIAGNOSIS — Z91013 Allergy to seafood: Secondary | ICD-10-CM | POA: Diagnosis not present

## 2018-12-18 DIAGNOSIS — Z86711 Personal history of pulmonary embolism: Secondary | ICD-10-CM | POA: Diagnosis not present

## 2018-12-18 DIAGNOSIS — Z86718 Personal history of other venous thrombosis and embolism: Secondary | ICD-10-CM | POA: Diagnosis not present

## 2018-12-18 LAB — BASIC METABOLIC PANEL
Anion gap: 11 (ref 5–15)
BUN: 17 mg/dL (ref 8–23)
CO2: 24 mmol/L (ref 22–32)
Calcium: 9 mg/dL (ref 8.9–10.3)
Chloride: 107 mmol/L (ref 98–111)
Creatinine, Ser: 1.06 mg/dL (ref 0.61–1.24)
GFR calc Af Amer: >60 mL/min (ref >60)
GFR calc non Af Amer: >60 mL/min (ref >60)
Glucose, Bld: 145 mg/dL — ABNORMAL HIGH (ref 70–99)
Potassium: 3.7 mmol/L (ref 3.5–5.1)
Sodium: 142 mmol/L (ref 135–145)

## 2018-12-18 NOTE — Progress Notes (Signed)
EKG viewed per Dr Glennon Mac

## 2018-12-19 LAB — NOVEL CORONAVIRUS, NAA (HOSP ORDER, SEND-OUT TO REF LAB; TAT 18-24 HRS): SARS-CoV-2, NAA: NOT DETECTED

## 2018-12-21 ENCOUNTER — Encounter (HOSPITAL_BASED_OUTPATIENT_CLINIC_OR_DEPARTMENT_OTHER)
Admission: RE | Disposition: A | Discharge status: Home / Self Care | Payer: Self-pay | Source: Home / Self Care | Attending: Orthopedic Surgery

## 2018-12-21 ENCOUNTER — Ambulatory Visit (HOSPITAL_BASED_OUTPATIENT_CLINIC_OR_DEPARTMENT_OTHER): Payer: Medicare HMO | Admitting: Anesthesiology

## 2018-12-21 ENCOUNTER — Ambulatory Visit (HOSPITAL_BASED_OUTPATIENT_CLINIC_OR_DEPARTMENT_OTHER)
Admission: RE | Admit: 2018-12-21 | Discharge: 2018-12-21 | Disposition: A | Discharge status: Home / Self Care | Payer: Medicare HMO | Attending: Orthopedic Surgery | Admitting: Orthopedic Surgery

## 2018-12-21 ENCOUNTER — Other Ambulatory Visit: Payer: Self-pay

## 2018-12-21 ENCOUNTER — Encounter (HOSPITAL_BASED_OUTPATIENT_CLINIC_OR_DEPARTMENT_OTHER): Payer: Self-pay | Admitting: Anesthesiology

## 2018-12-21 DIAGNOSIS — E119 Type 2 diabetes mellitus without complications: Secondary | ICD-10-CM | POA: Diagnosis not present

## 2018-12-21 DIAGNOSIS — Z91013 Allergy to seafood: Secondary | ICD-10-CM | POA: Insufficient documentation

## 2018-12-21 DIAGNOSIS — I96 Gangrene, not elsewhere classified: Secondary | ICD-10-CM | POA: Insufficient documentation

## 2018-12-21 DIAGNOSIS — Z87891 Personal history of nicotine dependence: Secondary | ICD-10-CM | POA: Diagnosis not present

## 2018-12-21 DIAGNOSIS — K5090 Crohn's disease, unspecified, without complications: Secondary | ICD-10-CM | POA: Insufficient documentation

## 2018-12-21 DIAGNOSIS — Z86718 Personal history of other venous thrombosis and embolism: Secondary | ICD-10-CM | POA: Diagnosis not present

## 2018-12-21 DIAGNOSIS — Z881 Allergy status to other antibiotic agents status: Secondary | ICD-10-CM | POA: Insufficient documentation

## 2018-12-21 DIAGNOSIS — Z86711 Personal history of pulmonary embolism: Secondary | ICD-10-CM | POA: Insufficient documentation

## 2018-12-21 HISTORY — PX: AMPUTATION: SHX166

## 2018-12-21 LAB — GLUCOSE, CAPILLARY
Glucose-Capillary: 163 mg/dL — ABNORMAL HIGH (ref 70–99)
Glucose-Capillary: 158 mg/dL — ABNORMAL HIGH (ref 70–99)

## 2018-12-21 SURGERY — AMPUTATION DIGIT
Anesthesia: General | Laterality: Right

## 2018-12-21 MED ORDER — CEFAZOLIN SODIUM-DEXTROSE 2-4 GM/100ML-% IV SOLN
INTRAVENOUS | Status: AC
  Filled 2018-12-21: qty 100

## 2018-12-21 MED ORDER — SCOPOLAMINE 1 MG/3DAYS TD PT72: 1.0000 | MEDICATED_PATCH | Freq: Once | TRANSDERMAL | Status: DC

## 2018-12-21 MED ORDER — FENTANYL CITRATE (PF) 100 MCG/2ML IJ SOLN: 25.0000 ug | INTRAMUSCULAR | Status: DC | PRN

## 2018-12-21 MED ORDER — MEPERIDINE HCL 25 MG/ML IJ SOLN: 6.2500 mg | INTRAMUSCULAR | Status: DC | PRN

## 2018-12-21 MED ORDER — PROPOFOL 10 MG/ML IV BOLUS
INTRAVENOUS | Status: AC
  Filled 2018-12-21: qty 20

## 2018-12-21 MED ORDER — FENTANYL CITRATE (PF) 100 MCG/2ML IJ SOLN
INTRAMUSCULAR | Status: AC
  Filled 2018-12-21: qty 2

## 2018-12-21 MED ORDER — CEFAZOLIN SODIUM-DEXTROSE 2-4 GM/100ML-% IV SOLN
2.0000 g | INTRAVENOUS | Status: AC
  Administered 2018-12-21: 2 g via INTRAVENOUS

## 2018-12-21 MED ORDER — LACTATED RINGERS IV SOLN
INTRAVENOUS | Status: DC
  Administered 2018-12-21: 13:00:00 via INTRAVENOUS

## 2018-12-21 MED ORDER — LIDOCAINE 2% (20 MG/ML) 5 ML SYRINGE
INTRAMUSCULAR | Status: AC
  Filled 2018-12-21: qty 5

## 2018-12-21 MED ORDER — PHENYLEPHRINE 40 MCG/ML (10ML) SYRINGE FOR IV PUSH (FOR BLOOD PRESSURE SUPPORT)
PREFILLED_SYRINGE | INTRAVENOUS | Status: AC
  Filled 2018-12-21: qty 10

## 2018-12-21 MED ORDER — ONDANSETRON HCL 4 MG/2ML IJ SOLN
INTRAMUSCULAR | Status: AC
  Filled 2018-12-21: qty 2

## 2018-12-21 MED ORDER — DEXAMETHASONE SODIUM PHOSPHATE 4 MG/ML IJ SOLN
INTRAMUSCULAR | Status: DC | PRN
  Administered 2018-12-21: 10 mg via INTRAVENOUS

## 2018-12-21 MED ORDER — LIDOCAINE 2% (20 MG/ML) 5 ML SYRINGE
INTRAMUSCULAR | Status: DC | PRN
  Administered 2018-12-21: 50 mg via INTRAVENOUS

## 2018-12-21 MED ORDER — CHLORHEXIDINE GLUCONATE 4 % EX LIQD: 60.0000 mL | Freq: Once | CUTANEOUS | Status: DC

## 2018-12-21 MED ORDER — ONDANSETRON HCL 4 MG/2ML IJ SOLN
INTRAMUSCULAR | Status: DC | PRN
  Administered 2018-12-21: 4 mg via INTRAVENOUS

## 2018-12-21 MED ORDER — BUPIVACAINE HCL (PF) 0.25 % IJ SOLN
INTRAMUSCULAR | Status: DC | PRN
  Administered 2018-12-21: 10 mL

## 2018-12-21 MED ORDER — PROPOFOL 10 MG/ML IV BOLUS
INTRAVENOUS | Status: DC | PRN
  Administered 2018-12-21: 150 mg via INTRAVENOUS

## 2018-12-21 MED ORDER — HYDROCODONE-ACETAMINOPHEN 5-325 MG PO TABS: ORAL_TABLET | ORAL | 0 refills | Status: DC

## 2018-12-21 MED ORDER — MIDAZOLAM HCL 2 MG/2ML IJ SOLN: 1.0000 mg | INTRAMUSCULAR | Status: DC | PRN

## 2018-12-21 MED ORDER — DEXAMETHASONE SODIUM PHOSPHATE 10 MG/ML IJ SOLN
INTRAMUSCULAR | Status: AC
  Filled 2018-12-21: qty 1

## 2018-12-21 MED ORDER — PHENYLEPHRINE HCL (PRESSORS) 10 MG/ML IV SOLN
INTRAVENOUS | Status: DC | PRN
  Administered 2018-12-21 (×2): 80 ug via INTRAVENOUS

## 2018-12-21 MED ORDER — FENTANYL CITRATE (PF) 100 MCG/2ML IJ SOLN
INTRAMUSCULAR | Status: DC | PRN
  Administered 2018-12-21 (×2): 50 ug via INTRAVENOUS

## 2018-12-21 MED ORDER — METOCLOPRAMIDE HCL 5 MG/ML IJ SOLN: 10.0000 mg | Freq: Once | INTRAMUSCULAR | Status: DC | PRN

## 2018-12-21 SURGICAL SUPPLY — 45 items
BANDAGE ACE 3X5.8 VEL STRL LF (GAUZE/BANDAGES/DRESSINGS) IMPLANT
BLADE MINI RND TIP GREEN BEAV (BLADE) IMPLANT
BLADE SURG 15 STRL LF DISP TIS (BLADE) ×2 IMPLANT
BLADE SURG 15 STRL SS (BLADE) ×4
BNDG COHESIVE 1X5 TAN STRL LF (GAUZE/BANDAGES/DRESSINGS) ×3 IMPLANT
BNDG ELASTIC 2X5.8 VLCR STR LF (GAUZE/BANDAGES/DRESSINGS) IMPLANT
BNDG ESMARK 4X9 LF (GAUZE/BANDAGES/DRESSINGS) ×3 IMPLANT
BNDG GAUZE 1X2.1 STRL (MISCELLANEOUS) IMPLANT
CHLORAPREP W/TINT 26 (MISCELLANEOUS) ×3 IMPLANT
CORD BIPOLAR FORCEPS 12FT (ELECTRODE) ×3 IMPLANT
COVER BACK TABLE REUSABLE LG (DRAPES) ×3 IMPLANT
COVER MAYO STAND REUSABLE (DRAPES) ×3 IMPLANT
COVER WAND RF STERILE (DRAPES) IMPLANT
DECANTER SPIKE VIAL GLASS SM (MISCELLANEOUS) ×3 IMPLANT
DRAIN PENROSE 1/4X12 LTX STRL (WOUND CARE) IMPLANT
DRAPE EXTREMITY T 121X128X90 (DISPOSABLE) ×3 IMPLANT
DRAPE OEC MINIVIEW 54X84 (DRAPES) IMPLANT
DRAPE SURG 17X23 STRL (DRAPES) ×3 IMPLANT
GAUZE SPONGE 4X4 12PLY STRL (GAUZE/BANDAGES/DRESSINGS) ×3 IMPLANT
GAUZE SPONGE 4X4 12PLY STRL LF (GAUZE/BANDAGES/DRESSINGS) ×3 IMPLANT
GAUZE XEROFORM 1X8 LF (GAUZE/BANDAGES/DRESSINGS) ×3 IMPLANT
GLOVE BIO SURGEON STRL SZ7.5 (GLOVE) ×3 IMPLANT
GLOVE BIOGEL PI IND STRL 8 (GLOVE) ×1 IMPLANT
GLOVE BIOGEL PI INDICATOR 8 (GLOVE) ×2
GOWN STRL REUS W/ TWL LRG LVL3 (GOWN DISPOSABLE) ×1 IMPLANT
GOWN STRL REUS W/TWL LRG LVL3 (GOWN DISPOSABLE) ×2
GOWN STRL REUS W/TWL XL LVL3 (GOWN DISPOSABLE) ×3 IMPLANT
NEEDLE HYPO 25X1 1.5 SAFETY (NEEDLE) ×3 IMPLANT
NS IRRIG 1000ML POUR BTL (IV SOLUTION) ×3 IMPLANT
PACK BASIN DAY SURGERY FS (CUSTOM PROCEDURE TRAY) ×3 IMPLANT
PADDING CAST ABS 4INX4YD NS (CAST SUPPLIES)
PADDING CAST ABS COTTON 4X4 ST (CAST SUPPLIES) IMPLANT
STOCKINETTE 4X48 STRL (DRAPES) ×3 IMPLANT
SUT CHROMIC 4 0 P 3 18 (SUTURE) IMPLANT
SUT ETHILON 3 0 PS 1 (SUTURE) IMPLANT
SUT ETHILON 4 0 PS 2 18 (SUTURE) IMPLANT
SUT MERSILENE 4 0 P 3 (SUTURE) IMPLANT
SUT MON AB 5-0 P3 18 (SUTURE) ×3 IMPLANT
SUT VIC AB 4-0 P-3 18XBRD (SUTURE) IMPLANT
SUT VIC AB 4-0 P3 18 (SUTURE)
SYR BULB 3OZ (MISCELLANEOUS) ×3 IMPLANT
SYR CONTROL 10ML LL (SYRINGE) ×3 IMPLANT
TOWEL GREEN STERILE FF (TOWEL DISPOSABLE) ×3 IMPLANT
TRAY DSU PREP LF (CUSTOM PROCEDURE TRAY) ×3 IMPLANT
UNDERPAD 30X30 (UNDERPADS AND DIAPERS) ×3 IMPLANT

## 2018-12-21 NOTE — Anesthesia Procedure Notes (Signed)
Procedure Name: LMA Insertion Date/Time: 12/21/2018 2:00 PM Performed by: Marrianne Mood, CRNA Pre-anesthesia Checklist: Patient identified, Emergency Drugs available, Suction available and Patient being monitored Patient Re-evaluated:Patient Re-evaluated prior to induction Oxygen Delivery Method: Circle system utilized Preoxygenation: Pre-oxygenation with 100% oxygen Induction Type: IV induction Ventilation: Mask ventilation without difficulty LMA: LMA inserted LMA Size: 5.0 Number of attempts: 1 Airway Equipment and Method: Bite block Placement Confirmation: positive ETCO2 Tube secured with: Tape Dental Injury: Teeth and Oropharynx as per pre-operative assessment

## 2018-12-21 NOTE — Discharge Instructions (Addendum)

## 2018-12-21 NOTE — Op Note (Signed)
NAME: Rodney Sullivan MEDICAL RECORD NO: [id number] DATE OF BIRTH: 21-Mar-1941 FACILITY: [facility] LOCATION: [facility] PHYSICIAN: Tennis Must, MD   OPERATIVE REPORT   DATE OF PROCEDURE: 12/21/18    PREOPERATIVE DIAGNOSIS:   Right long finger dry necrosis   POSTOPERATIVE DIAGNOSIS:   Right long finger dry necrosis   PROCEDURE:   Right long finger amputation through middle phalanx   SURGEON:  Leanora Cover, M.D.   ASSISTANT: none   ANESTHESIA:  General   INTRAVENOUS FLUIDS:  Per anesthesia flow sheet.   ESTIMATED BLOOD LOSS:  Minimal.   COMPLICATIONS:  None.   SPECIMENS:   Right long finger amputation to pathology for gross only   TOURNIQUET TIME:    Total Tourniquet Time Documented: Upper Arm (Right) - 23 minutes Total: Upper Arm (Right) - 23 minutes    DISPOSITION:  Stable to PACU.   INDICATIONS: 78 year old male with dry necrosis of right long finger.  He wishes to undergo amputation for management of this.  He would like to maintain as much length as possible. Risks, benefits and alternatives of surgery were discussed including the risks of blood loss, infection, damage to nerves, vessels, tendons, ligaments, bone for surgery, need for additional surgery, complications with wound healing, continued pain, nonunion, malunion, stiffness.  He voiced understanding of these risks and elected to proceed.  OPERATIVE COURSE:  After being identified preoperatively by myself,  the patient and I agreed on the procedure and site of the procedure.  The surgical site was marked.  Surgical consent had been signed. He was given IV antibiotics as preoperative antibiotic prophylaxis. He was transferred to the operating room and placed on the operating table in supine position with the Right upper extremity on an arm board.  General anesthesia was induced by the anesthesiologist.  Right upper extremity was prepped and draped in normal sterile orthopedic fashion.  A surgical  pause was performed between the surgeons, anesthesia, and operating room staff and all were in agreement as to the patient, procedure, and site of procedure.  Tourniquet at the proximal aspect of the extremity was inflated to 250 mmHg after exsanguination of the arm with an Esmarch bandage.    A fishmouth style incision was made over the middle phalanx removing all necrotic and at risk appearing skin.  The skin and subcutaneous tissue spread sparing technique.  Bipolar electrocautery is used to obtain hemostasis.  The radial and ulnar digital neurovascular bundles were identified.  The nerves were placed under traction transected and allowed to retract.  The arteries were treated with bipolar electrocautery.  The finger was amputated through the shaft of the middle phalanx.  This allowed good tension-free re-apposition of soft tissues over the end.  The edge of the bone was rounded with rongeurs.  The wound was copiously irrigated with sterile saline and closed with 5-0 Monocryl in an interrupted fashion.  Good tension-free re-apposition of skin edges was obtained.  Digital block was performed with cortisone plain Marcaine and postoperative edges.  The wound was dressed with sterile Xeroform 4 x 4 and wrapped with Coban dressing lightly.  AlumaFoam splints placed and wrapped lightly with Coban dressing.  The tourniquet was deflated at 23 minutes.  Fingertips were pink with brisk capillary refill after deflation of tourniquet.  The operative  drapes were broken down.  The patient was awoken from anesthesia safely.  He was transferred back to the stretcher and taken to PACU in stable condition.  I will see him  back in the office in 1 week for postoperative followup.  I will give him a prescription for Norco 5/325 1-2 tabs PO q6 hours prn pain, dispense # 20.   Leanora Cover, MD Electronically signed, 12/21/18

## 2018-12-21 NOTE — Transfer of Care (Signed)
Immediate Anesthesia Transfer of Care Note  Patient: Rodney Sullivan  Procedure(s) Performed: RIGHT LONG FINGER AMPUTATION (Right )  Patient Location: PACU  Anesthesia Type:General  Level of Consciousness: awake and patient cooperative  Airway & Oxygen Therapy: Patient Spontanous Breathing and Patient connected to face mask oxygen  Post-op Assessment: Report given to RN and Post -op Vital signs reviewed and stable  Post vital signs: Reviewed and stable  Last Vitals:  Vitals Value Taken Time  BP    Temp    Pulse    Resp    SpO2      Last Pain:  Vitals:   12/21/18 1237  TempSrc:   PainSc: 3       Patients Stated Pain Goal: 3 (05/07/58 4585)  Complications: No apparent anesthesia complications

## 2018-12-21 NOTE — H&P (Signed)
  Rodney Sullivan is an 78 y.o. male.   Chief Complaint: right long finger necrosis HPI: 78 yo male with dry necrosis distal aspect right long finger.  This is painful at times.  He wishes to proceed with amputation of the finger.  Allergies:  Allergies  Allergen Reactions  . Ciprofloxacin Other (See Comments)    tendons hurt  . Shrimp [Shellfish Allergy]     Past Medical History:  Diagnosis Date  . Bacteremia due to Escherichia coli 11/2013  . Crohn disease ([place])   . DVT (deep venous thrombosis) ([place]) 11/27/13  . Pulmonary embolism ([place]) 11/27/13     Past Surgical History:  Procedure Laterality Date  . ABDOMINAL AORTOGRAM N/A 12/14/2018   Procedure: ABDOMINAL AORTOGRAM;  Surgeon: Waynetta Sandy, MD;  Location: [facility];  Service: Cardiovascular;  Laterality: N/A;  . ABDOMINAL SURGERY     Bowel resection x2  . CHOLECYSTECTOMY    . COLON SURGERY    . HIP SURGERY     BOTH  . JOINT REPLACEMENT    . LOWER EXTREMITY ANGIOGRAPHY Right 12/14/2018   Procedure: LOWER EXTREMITY ANGIOGRAPHY;  Surgeon: Waynetta Sandy, MD;  Location: [facility];  Service: Cardiovascular;  Laterality: Right;  . SHOULDER SURGERY     RIGHT TENDON DETACHED  . UPPER EXTREMITY ANGIOGRAPHY Right 12/14/2018   Procedure: Right UPPER EXTREMITY ANGIOGRAPHY;  Surgeon: Waynetta Sandy, MD;  Location: [facility];  Service: Cardiovascular;  Laterality: Right;    Family History: History reviewed. No pertinent family history.  Social History:   reports that he has quit smoking. His smoking use included pipe. He quit after 10.00 years of use. He quit smokeless tobacco use about 39 years ago. He reports that he does not drink alcohol or use drugs.  Medications: No medications prior to admission.    No results found for this or any previous visit (from the past 48 hour(s)).  No results found.   A comprehensive review of systems was negative.  Height 5' 7"  (1.702  m), weight 74.4 kg.  General appearance: alert, cooperative and appears stated age Head: Normocephalic, without obvious abnormality, atraumatic Neck: supple, symmetrical, trachea midline Cardio: regular rate and rhythm Resp: clear to auscultation bilaterally Extremities: Intact sensation and capillary refill all digits.  +epl/fpl/io.  Dry gangrene distal aspect right middle finger. Pulses: 2+ and symmetric Skin: Skin color, texture, turgor normal. No rashes or lesions Neurologic: Grossly normal Incision/Wound: as above  Assessment/Plan Right long finger dry necrosis.  Plan amputation through middle phalanx to preserve digit length.  Non operative and operative treatment options have been discussed with the patient and patient wishes to proceed with operative treatment. Risks, benefits, and alternatives of surgery have been discussed and the patient agrees with the plan of care.   Leanora Cover 12/21/2018, 10:37 AM

## 2018-12-21 NOTE — Anesthesia Preprocedure Evaluation (Signed)
Anesthesia Evaluation  Patient identified by MRN, date of birth, ID band Patient awake    Reviewed: Allergy & Precautions, NPO status , Patient's Chart, lab work & pertinent test results  Airway Mallampati: II  TM Distance: >3 FB Neck ROM: Full    Dental no notable dental hx.    Pulmonary neg pulmonary ROS, former smoker,    Pulmonary exam normal breath sounds clear to auscultation       Cardiovascular + DVT  Normal cardiovascular exam Rhythm:Regular Rate:Normal     Neuro/Psych negative neurological ROS  negative psych ROS   GI/Hepatic negative GI ROS, Neg liver ROS,   Endo/Other  diabetes, Type 2, Oral Hypoglycemic Agents  Renal/GU negative Renal ROS  negative genitourinary   Musculoskeletal negative musculoskeletal ROS (+)   Abdominal   Peds negative pediatric ROS (+)  Hematology negative hematology ROS (+)   Anesthesia Other Findings   Reproductive/Obstetrics negative OB ROS                             Anesthesia Physical Anesthesia Plan  ASA: II  Anesthesia Plan: General   Post-op Pain Management:    Induction: Intravenous  PONV Risk Score and Plan: 2 and Ondansetron and Treatment may vary due to age or medical condition  Airway Management Planned: LMA  Additional Equipment:   Intra-op Plan:   Post-operative Plan:   Informed Consent: I have reviewed the patients History and Physical, chart, labs and discussed the procedure including the risks, benefits and alternatives for the proposed anesthesia with the patient or authorized representative who has indicated his/her understanding and acceptance.     Dental advisory given  Plan Discussed with: CRNA  Anesthesia Plan Comments:         Anesthesia Quick Evaluation

## 2018-12-22 ENCOUNTER — Encounter (HOSPITAL_BASED_OUTPATIENT_CLINIC_OR_DEPARTMENT_OTHER): Payer: Self-pay | Admitting: Orthopedic Surgery

## 2018-12-22 NOTE — Anesthesia Postprocedure Evaluation (Signed)
Anesthesia Post Note  Patient: Rodney Sullivan  Procedure(s) Performed: RIGHT LONG FINGER AMPUTATION (Right )     Patient location during evaluation: PACU Anesthesia Type: General Level of consciousness: awake and alert Pain management: pain level controlled Vital Signs Assessment: post-procedure vital signs reviewed and stable Respiratory status: spontaneous breathing, nonlabored ventilation, respiratory function stable and patient connected to nasal cannula oxygen Cardiovascular status: blood pressure returned to baseline and stable Postop Assessment: no apparent nausea or vomiting Anesthetic complications: no    Last Vitals:  Vitals:   12/21/18 1515 12/21/18 1548  BP: 126/70 117/70  Pulse: 70 66  Resp: 19 18  Temp:  37 C  SpO2: 96% 96%    Last Pain:  Vitals:   12/21/18 1548  TempSrc:   PainSc: 0-No pain                 [name]

## 2019-01-29 ENCOUNTER — Ambulatory Visit (INDEPENDENT_AMBULATORY_CARE_PROVIDER_SITE_OTHER): Payer: Medicare HMO | Admitting: Vascular Surgery

## 2019-01-29 ENCOUNTER — Encounter: Payer: Self-pay | Admitting: Vascular Surgery

## 2019-01-29 ENCOUNTER — Other Ambulatory Visit: Payer: Self-pay

## 2019-01-29 VITALS — BP 140/84 | HR 87 | Temp 97.4°F | Resp 20 | Ht 67.0 in | Wt 170.0 lb

## 2019-01-29 DIAGNOSIS — I739 Peripheral vascular disease, unspecified: Secondary | ICD-10-CM | POA: Diagnosis not present

## 2019-01-29 NOTE — Progress Notes (Signed)
Patient ID: Rodney Sullivan, male   DOB: June 01, 1941, 78 y.o.   MRN: [id number]  Reason for Consult: No chief complaint on file.   Referred by No ref. provider found  Subjective:     HPI:  Rodney Sullivan is a 78 y.o. male is undergone angiogram bilateral lower extremity runoff and right upper extremity runoff.  Is undergone amputation of finger on the right hand which is now healing well.  Also has ulceration on the right second toe.  He states that he is doing well does not have any worsening of the wounds at this time.  Past Medical History:  Diagnosis Date  . Bacteremia due to Escherichia coli 11/2013  . Crohn disease ([place])   . DVT (deep venous thrombosis) ([place]) 11/27/13  . Pulmonary embolism ([place]) 11/27/13    No family history on file. Past Surgical History:  Procedure Laterality Date  . ABDOMINAL AORTOGRAM N/A 12/14/2018   Procedure: ABDOMINAL AORTOGRAM;  Surgeon: Waynetta Sandy, MD;  Location: [facility];  Service: Cardiovascular;  Laterality: N/A;  . ABDOMINAL SURGERY     Bowel resection x2  . AMPUTATION Right 12/21/2018   Procedure: RIGHT LONG FINGER AMPUTATION;  Surgeon: Leanora Cover, MD;  Location: [facility];  Service: Orthopedics;  Laterality: Right;  . CHOLECYSTECTOMY    . COLON SURGERY    . HIP SURGERY     BOTH  . JOINT REPLACEMENT    . LOWER EXTREMITY ANGIOGRAPHY Right 12/14/2018   Procedure: LOWER EXTREMITY ANGIOGRAPHY;  Surgeon: Waynetta Sandy, MD;  Location: [facility];  Service: Cardiovascular;  Laterality: Right;  . SHOULDER SURGERY     RIGHT TENDON DETACHED  . UPPER EXTREMITY ANGIOGRAPHY Right 12/14/2018   Procedure: Right UPPER EXTREMITY ANGIOGRAPHY;  Surgeon: Waynetta Sandy, MD;  Location: [facility];  Service: Cardiovascular;  Laterality: Right;    Short Social History:  Social History   Tobacco Use  . Smoking status: Former Smoker    Years: 10.00    Types: Pipe  . Smokeless tobacco:  Former Systems developer    Quit date: 12/08/1979  Substance Use Topics  . Alcohol use: No    Alcohol/week: 0.0 standard drinks    Allergies  Allergen Reactions  . Ciprofloxacin Other (See Comments)    tendons hurt  . Shrimp [Shellfish Allergy]     Current Outpatient Medications  Medication Sig Dispense Refill  . albuterol (VENTOLIN HFA) 108 (90 Base) MCG/ACT inhaler Inhale 2 puffs into the lungs every 4 (four) hours as needed for wheezing or shortness of breath.     . allopurinol (ZYLOPRIM) 100 MG tablet Take 100 mg by mouth daily.    Marland Kitchen amLODipine (NORVASC) 2.5 MG tablet Take 2.5 mg by mouth daily with supper.     Marland Kitchen aspirin EC 81 MG tablet Take 81 mg by mouth daily with supper.     . benzonatate (TESSALON) 100 MG capsule Take 100 mg by mouth 3 (three) times daily as needed for cough.    . brimonidine-timolol (COMBIGAN) 0.2-0.5 % ophthalmic solution Place 1 drop into both eyes 2 (two) times a day.    . Certolizumab Pegol (CIMZIA) 2 X 200 MG KIT Inject 400 mg into the skin every 28 (twenty-eight) days.    . Cholecalciferol (VITAMIN D) 50 MCG (2000 UT) tablet Take 2,000 Units by mouth daily.    . clotrimazole-betamethasone (LOTRISONE) cream Apply 1 application topically 2 (two) times daily as needed (fungus). On the side of  mouth    . Cyanocobalamin (B-12) 2500 MCG TABS Take 2,500 mcg by mouth daily.    . cyclobenzaprine (FLEXERIL) 10 MG tablet Take 10 mg by mouth daily as needed for muscle spasms.    . Dexamethasone Acetate 8 MG/ML SUSP Inject 8 mg into the muscle daily as needed (for Crohn's flare).    . diphenhydrAMINE (BENADRYL) 25 MG tablet Take 25 mg by mouth 2 (two) times daily as needed for allergies or sleep.     Marland Kitchen Emollient (ZIMS CRACK CREME) CREA Apply 1 application topically daily as needed (dry skin).     Marland Kitchen empagliflozin (JARDIANCE) 10 MG TABS tablet Take 10 mg by mouth daily.    . famciclovir (FAMVIR) 250 MG tablet Take 250 mg by mouth daily as needed (fungus on corner of mouth flare).      . ferrous sulfate 325 (65 FE) MG tablet Take 325 mg by mouth daily.    Marland Kitchen HYDROcodone-acetaminophen (NORCO) 5-325 MG tablet 1-2 tabs po q6 hours prn pain 20 tablet 0  . ketoconazole (NIZORAL) 200 MG tablet Take 200 mg by mouth daily as needed (yeast infection).    Marland Kitchen latanoprost (XALATAN) 0.005 % ophthalmic solution Place 1 drop into both eyes at bedtime.    . mesalamine (PENTASA) 500 MG CR capsule Take 2,000 mg by mouth 2 (two) times daily.    . Multiple Vitamin (MULTIVITAMIN WITH MINERALS) TABS Take 1 tablet by mouth daily.    . Omega-3 Fatty Acids (FISH OIL) 1000 MG CAPS Take 1,000 mg by mouth daily.    . pantoprazole (PROTONIX) 40 MG tablet Take 40 mg by mouth daily at 3 pm.     . predniSONE (DELTASONE) 10 MG tablet Take 30 mg by mouth daily with breakfast.    . sulfamethoxazole-trimethoprim (BACTRIM DS) 800-160 MG tablet Take 1 tablet by mouth daily as needed (take after recieving dexamethazone injection). Take for 10 days as needed for crohn's disease flare     No current facility-administered medications for this visit.     Review of Systems  Constitutional:  Constitutional negative. HENT: HENT negative.  Eyes: Eyes negative.  Respiratory: Respiratory negative.  Cardiovascular: Cardiovascular negative.  GI: Gastrointestinal negative.  Skin: Skin negative.  Neurological: Neurological negative. Hematologic: Hematologic/lymphatic negative.  Psychiatric: Psychiatric negative.        Objective:   Vitals:   01/29/19 0855  BP: 140/84  Pulse: 87  Resp: 20  Temp: (!) 97.4 F (36.3 C)  SpO2: 95%    Physical Exam HENT:     Head: Normocephalic.     Nose: Nose normal.     Mouth/Throat:     Mouth: Mucous membranes are moist.  Eyes:     Pupils: Pupils are equal, round, and reactive to light.  Neck:     Musculoskeletal: Normal range of motion and neck supple.  Cardiovascular:     Pulses: Normal pulses.  Abdominal:     General: Abdomen is flat.     Palpations: Abdomen is  soft.  Musculoskeletal:     Comments: Bandage on amputated right finger Right second toe ulceration  Skin:    General: Skin is warm and dry.     Capillary Refill: Capillary refill takes less than 2 seconds.  Neurological:     General: No focal deficit present.     Mental Status: He is alert.  Psychiatric:        Mood and Affect: Mood normal.        Behavior: Behavior normal.  Thought Content: Thought content normal.        Judgment: Judgment normal.     Data: I reviewed previous angiography which demonstrated radial flow only to the wrist.  I minimal flow in the hand.  In the right lower extremity he had very slow filling of his distal vessels appear to have three-vessel runoff with minimal stenoses throughout the tibial vessels particularly the posterior tibial artery which was the dominant runoff.     Assessment/Plan:     78 year old male has multiple areas of ulceration is undergone amputation of the finger now has second toe ulceration with only mild flow-limiting tibial disease.  Would not recommend any vascular invention.  Could certainly have amputation of the second toe with hopeful healing although he does remain high risk for proximal limb loss in the right lower extremity.  We have discussed protecting his bilateral feet ongoing wound care and will follow-up in a few months for further evaluation.     Waynetta Sandy MD [facility]

## 2019-02-01 ENCOUNTER — Encounter: Payer: Self-pay | Admitting: Vascular Surgery

## 2019-02-15 ENCOUNTER — Other Ambulatory Visit: Payer: Self-pay

## 2019-02-15 ENCOUNTER — Inpatient Hospital Stay (HOSPITAL_COMMUNITY): Payer: Medicare HMO

## 2019-02-15 ENCOUNTER — Emergency Department (HOSPITAL_COMMUNITY): Payer: Medicare HMO

## 2019-02-15 ENCOUNTER — Encounter (HOSPITAL_COMMUNITY): Payer: Self-pay | Admitting: Emergency Medicine

## 2019-02-15 ENCOUNTER — Inpatient Hospital Stay (HOSPITAL_COMMUNITY)
Admission: EM | Admit: 2019-02-15 | Discharge: 2019-02-20 | DRG: 854 | Disposition: A | Discharge status: Home / Self Care | Payer: Medicare HMO | Attending: Internal Medicine | Admitting: Internal Medicine

## 2019-02-15 DIAGNOSIS — E1151 Type 2 diabetes mellitus with diabetic peripheral angiopathy without gangrene: Secondary | ICD-10-CM | POA: Diagnosis not present

## 2019-02-15 DIAGNOSIS — I998 Other disorder of circulatory system: Secondary | ICD-10-CM | POA: Diagnosis present

## 2019-02-15 DIAGNOSIS — Z86711 Personal history of pulmonary embolism: Secondary | ICD-10-CM

## 2019-02-15 DIAGNOSIS — M109 Gout, unspecified: Secondary | ICD-10-CM

## 2019-02-15 DIAGNOSIS — Z89021 Acquired absence of right finger(s): Secondary | ICD-10-CM | POA: Diagnosis not present

## 2019-02-15 DIAGNOSIS — L03116 Cellulitis of left lower limb: Secondary | ICD-10-CM | POA: Diagnosis present

## 2019-02-15 DIAGNOSIS — Z8249 Family history of ischemic heart disease and other diseases of the circulatory system: Secondary | ICD-10-CM

## 2019-02-15 DIAGNOSIS — I739 Peripheral vascular disease, unspecified: Secondary | ICD-10-CM | POA: Diagnosis not present

## 2019-02-15 DIAGNOSIS — L97519 Non-pressure chronic ulcer of other part of right foot with unspecified severity: Secondary | ICD-10-CM | POA: Diagnosis present

## 2019-02-15 DIAGNOSIS — E11621 Type 2 diabetes mellitus with foot ulcer: Secondary | ICD-10-CM | POA: Diagnosis present

## 2019-02-15 DIAGNOSIS — E1165 Type 2 diabetes mellitus with hyperglycemia: Secondary | ICD-10-CM | POA: Diagnosis not present

## 2019-02-15 DIAGNOSIS — Z79899 Other long term (current) drug therapy: Secondary | ICD-10-CM | POA: Diagnosis not present

## 2019-02-15 DIAGNOSIS — Z87891 Personal history of nicotine dependence: Secondary | ICD-10-CM | POA: Diagnosis not present

## 2019-02-15 DIAGNOSIS — Z7982 Long term (current) use of aspirin: Secondary | ICD-10-CM

## 2019-02-15 DIAGNOSIS — L02612 Cutaneous abscess of left foot: Secondary | ICD-10-CM | POA: Diagnosis present

## 2019-02-15 DIAGNOSIS — IMO0002 Reserved for concepts with insufficient information to code with codable children: Secondary | ICD-10-CM

## 2019-02-15 DIAGNOSIS — Z86718 Personal history of other venous thrombosis and embolism: Secondary | ICD-10-CM | POA: Diagnosis not present

## 2019-02-15 DIAGNOSIS — D509 Iron deficiency anemia, unspecified: Secondary | ICD-10-CM | POA: Diagnosis present

## 2019-02-15 DIAGNOSIS — E1152 Type 2 diabetes mellitus with diabetic peripheral angiopathy with gangrene: Secondary | ICD-10-CM | POA: Diagnosis present

## 2019-02-15 DIAGNOSIS — L03032 Cellulitis of left toe: Secondary | ICD-10-CM | POA: Diagnosis present

## 2019-02-15 DIAGNOSIS — Z881 Allergy status to other antibiotic agents status: Secondary | ICD-10-CM

## 2019-02-15 DIAGNOSIS — L0390 Cellulitis, unspecified: Secondary | ICD-10-CM | POA: Diagnosis present

## 2019-02-15 DIAGNOSIS — K5090 Crohn's disease, unspecified, without complications: Secondary | ICD-10-CM | POA: Diagnosis present

## 2019-02-15 DIAGNOSIS — Z7952 Long term (current) use of systemic steroids: Secondary | ICD-10-CM | POA: Diagnosis not present

## 2019-02-15 DIAGNOSIS — E876 Hypokalemia: Secondary | ICD-10-CM | POA: Diagnosis present

## 2019-02-15 DIAGNOSIS — Z91013 Allergy to seafood: Secondary | ICD-10-CM

## 2019-02-15 DIAGNOSIS — A419 Sepsis, unspecified organism: Secondary | ICD-10-CM | POA: Diagnosis present

## 2019-02-15 DIAGNOSIS — H409 Unspecified glaucoma: Secondary | ICD-10-CM | POA: Diagnosis present

## 2019-02-15 DIAGNOSIS — Z9049 Acquired absence of other specified parts of digestive tract: Secondary | ICD-10-CM

## 2019-02-15 DIAGNOSIS — I10 Essential (primary) hypertension: Secondary | ICD-10-CM | POA: Diagnosis present

## 2019-02-15 DIAGNOSIS — Z20828 Contact with and (suspected) exposure to other viral communicable diseases: Secondary | ICD-10-CM | POA: Diagnosis present

## 2019-02-15 DIAGNOSIS — I96 Gangrene, not elsewhere classified: Secondary | ICD-10-CM

## 2019-02-15 DIAGNOSIS — D649 Anemia, unspecified: Secondary | ICD-10-CM

## 2019-02-15 DIAGNOSIS — K219 Gastro-esophageal reflux disease without esophagitis: Secondary | ICD-10-CM | POA: Diagnosis present

## 2019-02-15 DIAGNOSIS — R6520 Severe sepsis without septic shock: Secondary | ICD-10-CM | POA: Diagnosis present

## 2019-02-15 DIAGNOSIS — E1140 Type 2 diabetes mellitus with diabetic neuropathy, unspecified: Secondary | ICD-10-CM | POA: Diagnosis present

## 2019-02-15 HISTORY — DX: Personal history of other malignant neoplasm of skin: Z85.828

## 2019-02-15 HISTORY — DX: Type 2 diabetes mellitus with diabetic peripheral angiopathy without gangrene: E11.51

## 2019-02-15 HISTORY — DX: Peripheral vascular disease, unspecified: I73.9

## 2019-02-15 HISTORY — DX: Type 2 diabetes mellitus without complications: E11.9

## 2019-02-15 LAB — COMPREHENSIVE METABOLIC PANEL
ALT: 26 U/L (ref 0–44)
AST: 35 U/L (ref 15–41)
Albumin: 3.1 g/dL — ABNORMAL LOW (ref 3.5–5.0)
Alkaline Phosphatase: 43 U/L (ref 38–126)
Anion gap: 12 (ref 5–15)
BUN: 17 mg/dL (ref 8–23)
CO2: 23 mmol/L (ref 22–32)
Calcium: 8.5 mg/dL — ABNORMAL LOW (ref 8.9–10.3)
Chloride: 103 mmol/L (ref 98–111)
Creatinine, Ser: 0.95 mg/dL (ref 0.61–1.24)
GFR calc Af Amer: >60 mL/min (ref >60)
GFR calc non Af Amer: >60 mL/min (ref >60)
Glucose, Bld: 139 mg/dL — ABNORMAL HIGH (ref 70–99)
Potassium: 3.5 mmol/L (ref 3.5–5.1)
Sodium: 138 mmol/L (ref 135–145)
Total Bilirubin: 0.7 mg/dL (ref 0.3–1.2)
Total Protein: 6.1 g/dL — ABNORMAL LOW (ref 6.5–8.1)

## 2019-02-15 LAB — PROTIME-INR
INR: 1 (ref 0.8–1.2)
Prothrombin Time: 13.1 seconds (ref 11.4–15.2)

## 2019-02-15 LAB — CBC WITH DIFFERENTIAL/PLATELET
Abs Immature Granulocytes: 0.09 10*3/uL — ABNORMAL HIGH (ref 0.00–0.07)
Basophils Absolute: 0 10*3/uL (ref 0.0–0.1)
Basophils Relative: 0 %
Eosinophils Absolute: 0 10*3/uL (ref 0.0–0.5)
Eosinophils Relative: 0 %
HCT: 36.8 % — ABNORMAL LOW (ref 39.0–52.0)
Hemoglobin: 11.4 g/dL — ABNORMAL LOW (ref 13.0–17.0)
Immature Granulocytes: 1 %
Lymphocytes Relative: 12 %
Lymphs Abs: 1.9 10*3/uL (ref 0.7–4.0)
MCH: 28.9 pg (ref 26.0–34.0)
MCHC: 31 g/dL (ref 30.0–36.0)
MCV: 93.4 fL (ref 80.0–100.0)
Monocytes Absolute: 2 10*3/uL — ABNORMAL HIGH (ref 0.1–1.0)
Monocytes Relative: 13 %
Neutro Abs: 11.6 10*3/uL — ABNORMAL HIGH (ref 1.7–7.7)
Neutrophils Relative %: 74 %
Platelets: 243 10*3/uL (ref 150–400)
RBC: 3.94 MIL/uL — ABNORMAL LOW (ref 4.22–5.81)
RDW: 13.4 % (ref 11.5–15.5)
WBC: 15.6 10*3/uL — ABNORMAL HIGH (ref 4.0–10.5)
nRBC: 0 % (ref 0.0–0.2)

## 2019-02-15 LAB — URINALYSIS, ROUTINE W REFLEX MICROSCOPIC
Bilirubin Urine: NEGATIVE
Glucose, UA: NEGATIVE mg/dL
Hgb urine dipstick: NEGATIVE
Ketones, ur: 5 mg/dL — AB
Leukocytes,Ua: NEGATIVE
Nitrite: NEGATIVE
Protein, ur: NEGATIVE mg/dL
Specific Gravity, Urine: 1.015 (ref 1.005–1.030)
pH: 6 (ref 5.0–8.0)

## 2019-02-15 LAB — SARS CORONAVIRUS 2 BY RT PCR (HOSPITAL ORDER, PERFORMED IN ~~LOC~~ HOSPITAL LAB): SARS Coronavirus 2: NEGATIVE

## 2019-02-15 LAB — C-REACTIVE PROTEIN: CRP: 10.3 mg/dL — ABNORMAL HIGH (ref <1.0)

## 2019-02-15 LAB — APTT: aPTT: 25 seconds (ref 24–36)

## 2019-02-15 LAB — SEDIMENTATION RATE: Sed Rate: 31 mm/hr — ABNORMAL HIGH (ref 0–16)

## 2019-02-15 LAB — LACTIC ACID, PLASMA
Lactic Acid, Venous: 3.5 mmol/L (ref 0.5–1.9)
Lactic Acid, Venous: 3 mmol/L (ref 0.5–1.9)

## 2019-02-15 MED ORDER — BRIMONIDINE TARTRATE-TIMOLOL 0.2-0.5 % OP SOLN: 1.0000 [drp] | Freq: Two times a day (BID) | OPHTHALMIC | Status: DC

## 2019-02-15 MED ORDER — ASPIRIN EC 81 MG PO TBEC
81.0000 mg | DELAYED_RELEASE_TABLET | Freq: Every day | ORAL | Status: DC
  Administered 2019-02-15 – 2019-02-19 (×4): 81 mg via ORAL
  Filled 2019-02-15 (×4): qty 1

## 2019-02-15 MED ORDER — ALLOPURINOL 100 MG PO TABS
100.0000 mg | ORAL_TABLET | Freq: Every day | ORAL | Status: DC
  Administered 2019-02-15 – 2019-02-20 (×6): 100 mg via ORAL
  Filled 2019-02-15 (×6): qty 1

## 2019-02-15 MED ORDER — LATANOPROST 0.005 % OP SOLN
1.0000 [drp] | Freq: Every day | OPHTHALMIC | Status: DC
  Administered 2019-02-16 – 2019-02-19 (×4): 1 [drp] via OPHTHALMIC
  Filled 2019-02-15 (×2): qty 2.5

## 2019-02-15 MED ORDER — PIOGLITAZONE HCL 15 MG PO TABS
15.0000 mg | ORAL_TABLET | Freq: Every day | ORAL | Status: DC
  Administered 2019-02-15 – 2019-02-16 (×2): 15 mg via ORAL
  Filled 2019-02-15 (×4): qty 1

## 2019-02-15 MED ORDER — ALBUTEROL SULFATE HFA 108 (90 BASE) MCG/ACT IN AERS
2.0000 | INHALATION_SPRAY | Freq: Three times a day (TID) | RESPIRATORY_TRACT | Status: DC
  Administered 2019-02-15: 2 via RESPIRATORY_TRACT
  Filled 2019-02-15: qty 6.7

## 2019-02-15 MED ORDER — GADOBUTROL 1 MMOL/ML IV SOLN
10.0000 mL | Freq: Once | INTRAVENOUS | Status: AC | PRN
  Administered 2019-02-15: 8 mL via INTRAVENOUS

## 2019-02-15 MED ORDER — VANCOMYCIN HCL 10 G IV SOLR
1750.0000 mg | Freq: Once | INTRAVENOUS | Status: AC
  Administered 2019-02-15: 1750 mg via INTRAVENOUS
  Filled 2019-02-15: qty 1750

## 2019-02-15 MED ORDER — VANCOMYCIN HCL IN DEXTROSE 1-5 GM/200ML-% IV SOLN: 1000.0000 mg | Freq: Once | INTRAVENOUS | Status: DC

## 2019-02-15 MED ORDER — BRIMONIDINE TARTRATE 0.2 % OP SOLN
1.0000 [drp] | Freq: Two times a day (BID) | OPHTHALMIC | Status: DC
  Administered 2019-02-15 – 2019-02-20 (×11): 1 [drp] via OPHTHALMIC
  Filled 2019-02-15 (×2): qty 5

## 2019-02-15 MED ORDER — VITAMIN B-12 1000 MCG PO TABS
2500.0000 ug | ORAL_TABLET | Freq: Every day | ORAL | Status: DC
  Administered 2019-02-15 – 2019-02-20 (×6): 2500 ug via ORAL
  Filled 2019-02-15 (×4): qty 3
  Filled 2019-02-15: qty 2.5
  Filled 2019-02-15: qty 3

## 2019-02-15 MED ORDER — PANTOPRAZOLE SODIUM 40 MG PO TBEC
40.0000 mg | DELAYED_RELEASE_TABLET | Freq: Every day | ORAL | Status: DC
  Administered 2019-02-15 – 2019-02-19 (×5): 40 mg via ORAL
  Filled 2019-02-15 (×5): qty 1

## 2019-02-15 MED ORDER — ALBUTEROL SULFATE HFA 108 (90 BASE) MCG/ACT IN AERS
2.0000 | INHALATION_SPRAY | RESPIRATORY_TRACT | Status: DC | PRN
  Filled 2019-02-15: qty 6.7

## 2019-02-15 MED ORDER — LACTATED RINGERS IV BOLUS (SEPSIS)
1000.0000 mL | Freq: Once | INTRAVENOUS | Status: AC
  Administered 2019-02-15: 1000 mL via INTRAVENOUS

## 2019-02-15 MED ORDER — ADULT MULTIVITAMIN W/MINERALS CH
1.0000 | ORAL_TABLET | Freq: Every day | ORAL | Status: DC
  Administered 2019-02-15 – 2019-02-20 (×6): 1 via ORAL
  Filled 2019-02-15 (×6): qty 1

## 2019-02-15 MED ORDER — METRONIDAZOLE IN NACL 5-0.79 MG/ML-% IV SOLN
500.0000 mg | Freq: Once | INTRAVENOUS | Status: AC
  Administered 2019-02-15: 500 mg via INTRAVENOUS
  Filled 2019-02-15: qty 100

## 2019-02-15 MED ORDER — AMLODIPINE BESYLATE 5 MG PO TABS
2.5000 mg | ORAL_TABLET | Freq: Every day | ORAL | Status: DC
  Administered 2019-02-15 – 2019-02-19 (×4): 2.5 mg via ORAL
  Filled 2019-02-15 (×4): qty 1

## 2019-02-15 MED ORDER — ACETAMINOPHEN 325 MG PO TABS
650.0000 mg | ORAL_TABLET | Freq: Four times a day (QID) | ORAL | Status: DC | PRN
  Administered 2019-02-15 – 2019-02-16 (×2): 650 mg via ORAL
  Filled 2019-02-15 (×2): qty 2

## 2019-02-15 MED ORDER — PIPERACILLIN-TAZOBACTAM 3.375 G IVPB
3.3750 g | Freq: Three times a day (TID) | INTRAVENOUS | Status: DC
  Administered 2019-02-15 – 2019-02-18 (×8): 3.375 g via INTRAVENOUS
  Filled 2019-02-15 (×11): qty 50

## 2019-02-15 MED ORDER — ALBUTEROL SULFATE (2.5 MG/3ML) 0.083% IN NEBU: 2.5000 mg | INHALATION_SOLUTION | RESPIRATORY_TRACT | Status: DC | PRN

## 2019-02-15 MED ORDER — OXYCODONE HCL 5 MG PO TABS
5.0000 mg | ORAL_TABLET | Freq: Four times a day (QID) | ORAL | Status: DC | PRN
  Administered 2019-02-15 – 2019-02-17 (×4): 5 mg via ORAL
  Filled 2019-02-15 (×5): qty 1

## 2019-02-15 MED ORDER — BENZONATATE 100 MG PO CAPS: 100.0000 mg | ORAL_CAPSULE | Freq: Three times a day (TID) | ORAL | Status: DC | PRN

## 2019-02-15 MED ORDER — SODIUM CHLORIDE 0.9 % IV SOLN
2.0000 g | Freq: Once | INTRAVENOUS | Status: AC
  Administered 2019-02-15: 2 g via INTRAVENOUS
  Filled 2019-02-15: qty 2

## 2019-02-15 MED ORDER — PREDNISONE 5 MG PO TABS
25.0000 mg | ORAL_TABLET | Freq: Every day | ORAL | Status: DC
  Administered 2019-02-15 – 2019-02-20 (×6): 25 mg via ORAL
  Filled 2019-02-15 (×6): qty 1

## 2019-02-15 MED ORDER — TIMOLOL MALEATE 0.5 % OP SOLN
1.0000 [drp] | Freq: Two times a day (BID) | OPHTHALMIC | Status: DC
  Administered 2019-02-15 – 2019-02-20 (×11): 1 [drp] via OPHTHALMIC
  Filled 2019-02-15 (×2): qty 5

## 2019-02-15 MED ORDER — MESALAMINE ER 250 MG PO CPCR
2000.0000 mg | ORAL_CAPSULE | Freq: Two times a day (BID) | ORAL | Status: DC
  Administered 2019-02-15 – 2019-02-20 (×10): 2000 mg via ORAL
  Filled 2019-02-15 (×11): qty 8
  Filled 2019-02-15: qty 4
  Filled 2019-02-15 (×3): qty 8

## 2019-02-15 MED ORDER — VITAMIN D 25 MCG (1000 UNIT) PO TABS
2000.0000 [IU] | ORAL_TABLET | Freq: Every day | ORAL | Status: DC
  Administered 2019-02-15 – 2019-02-20 (×6): 2000 [IU] via ORAL
  Filled 2019-02-15 (×6): qty 2

## 2019-02-15 MED ORDER — ENOXAPARIN SODIUM 40 MG/0.4ML ~~LOC~~ SOLN
40.0000 mg | Freq: Every day | SUBCUTANEOUS | Status: DC
  Filled 2019-02-15: qty 0.4

## 2019-02-15 MED ORDER — FERROUS SULFATE 325 (65 FE) MG PO TABS
325.0000 mg | ORAL_TABLET | Freq: Every day | ORAL | Status: DC
  Administered 2019-02-15 – 2019-02-20 (×5): 325 mg via ORAL
  Filled 2019-02-15 (×5): qty 1

## 2019-02-15 MED ORDER — ALBUTEROL SULFATE (2.5 MG/3ML) 0.083% IN NEBU
2.5000 mg | INHALATION_SOLUTION | Freq: Three times a day (TID) | RESPIRATORY_TRACT | Status: DC
  Administered 2019-02-15 – 2019-02-16 (×3): 2.5 mg via RESPIRATORY_TRACT
  Filled 2019-02-15 (×3): qty 3

## 2019-02-15 MED ORDER — SODIUM CHLORIDE 0.9 % IV SOLN
INTRAVENOUS | Status: DC
  Administered 2019-02-15: 10:00:00 via INTRAVENOUS

## 2019-02-15 MED ORDER — VANCOMYCIN HCL 10 G IV SOLR
1500.0000 mg | Freq: Every day | INTRAVENOUS | Status: DC
  Administered 2019-02-16 – 2019-02-17 (×2): 1500 mg via INTRAVENOUS
  Filled 2019-02-15 (×2): qty 1500

## 2019-02-15 MED ORDER — ACETAMINOPHEN 650 MG RE SUPP: 650.0000 mg | Freq: Four times a day (QID) | RECTAL | Status: DC | PRN

## 2019-02-15 NOTE — ED Notes (Signed)
Hospitilist at bedside.

## 2019-02-15 NOTE — ED Notes (Signed)
Urine culture sent to the lab. 

## 2019-02-15 NOTE — Consult Note (Signed)
Referring Physician: Dr Sarajane Jews  Patient name: Rodney Sullivan MRN: [id number] DOB: 1940/12/12 Sex: male  REASON FOR CONSULT: gangrene left foot  HPI: Rodney Sullivan is a 78 y.o. male, who noticed blistering that started on his left foot a few days ago.  He does not recall any traumatic event to the left foot.  He stated then after that the skin began to slough and he began to notice dark discoloration of the foot.  He has chronic neuropathy in both lower extremities.  He has previously been seen by my partner Dr. Donzetta Matters a few weeks ago.  At that point he had an arteriogram done of the right lower extremity which showed inline flow through the posterior tibial artery.  Unfortunately no contrast angiogram was performed of the left leg at that time.  Patient does not really have history of claudication.  Other medical problems include diabetes, Crohn's disease both of which are currently stable.  Patient was started on IV antibiotics at the time of admission today.  He is currently on Vanco and Zosyn.  He does not really complain of pain in the foot.  He has a chronic ulcer on his right foot which is slowly healing.  He is on aspirin.  Past Medical History:  Diagnosis Date  . Bacteremia due to Escherichia coli 11/2013  . Crohn disease ([place])   . DM (diabetes mellitus), type 2 with peripheral vascular complications ([place]) 10/07/270  . DVT (deep venous thrombosis) ([place]) 11/27/13  . Peripheral arterial disease ([place]) 02/15/2019  . Pulmonary embolism ([place]) 11/27/13    Past Surgical History:  Procedure Laterality Date  . ABDOMINAL AORTOGRAM N/A 12/14/2018   Procedure: ABDOMINAL AORTOGRAM;  Surgeon: Waynetta Sandy, MD;  Location: [facility];  Service: Cardiovascular;  Laterality: N/A;  . ABDOMINAL SURGERY     Bowel resection x2  . AMPUTATION Right 12/21/2018   Procedure: RIGHT LONG FINGER AMPUTATION;  Surgeon: Leanora Cover, MD;  Location: [facility];  Service: Orthopedics;   Laterality: Right;  . CHOLECYSTECTOMY    . COLON SURGERY    . HIP SURGERY     BOTH  . JOINT REPLACEMENT    . LOWER EXTREMITY ANGIOGRAPHY Right 12/14/2018   Procedure: LOWER EXTREMITY ANGIOGRAPHY;  Surgeon: Waynetta Sandy, MD;  Location: [facility];  Service: Cardiovascular;  Laterality: Right;  . SHOULDER SURGERY     RIGHT TENDON DETACHED  . UPPER EXTREMITY ANGIOGRAPHY Right 12/14/2018   Procedure: Right UPPER EXTREMITY ANGIOGRAPHY;  Surgeon: Waynetta Sandy, MD;  Location: [facility];  Service: Cardiovascular;  Laterality: Right;    Family History  Problem Relation Age of Onset  . Hypertension Mother     SOCIAL HISTORY: Social History   Socioeconomic History  . Marital status: Married    Spouse name: Not on file  . Number of children: Not on file  . Years of education: Not on file  . Highest education level: Not on file  Occupational History  . Not on file  Social Needs  . Financial resource strain: Not on file  . Food insecurity    Worry: Not on file    Inability: Not on file  . Transportation needs    Medical: Not on file    Non-medical: Not on file  Tobacco Use  . Smoking status: Former Smoker    Years: 10.00    Types: Pipe  . Smokeless tobacco: Former Systems developer    Quit date: 12/08/1979  Substance and Sexual Activity  . Alcohol use: No    Alcohol/week: 0.0 standard drinks  . Drug use: No  . Sexual activity: Never  Lifestyle  . Physical activity    Days per week: Not on file    Minutes per session: Not on file  . Stress: Not on file  Relationships  . Social Herbalist on phone: Not on file    Gets together: Not on file    Attends religious service: Not on file    Active member of club or organization: Not on file    Attends meetings of clubs or organizations: Not on file    Relationship status: Not on file  . Intimate partner violence    Fear of current or ex partner: Not on file    Emotionally abused: Not on file     Physically abused: Not on file    Forced sexual activity: Not on file  Other Topics Concern  . Not on file  Social History Narrative  . Not on file    Allergies  Allergen Reactions  . Ciprofloxacin Other (See Comments)    tendons hurt  . Shrimp [Shellfish Allergy]     Current Facility-Administered Medications  Medication Dose Route Frequency Provider Last Rate Last Dose  . acetaminophen (TYLENOL) tablet 650 mg  650 mg Oral Q6H PRN Jani Gravel, MD   650 mg at 02/15/19 1431   Or  . acetaminophen (TYLENOL) suppository 650 mg  650 mg Rectal Q6H PRN Jani Gravel, MD      . albuterol (PROVENTIL) (2.5 MG/3ML) 0.083% nebulizer solution 2.5 mg  2.5 mg Nebulization Q4H PRN [place] Eric J, DO      . albuterol (PROVENTIL) (2.5 MG/3ML) 0.083% nebulizer solution 2.5 mg  2.5 mg Nebulization TID [place] Eric J, DO   2.5 mg at 02/15/19 1403  . allopurinol (ZYLOPRIM) tablet 100 mg  100 mg Oral Daily Jani Gravel, MD   100 mg at 02/15/19 1014  . amLODipine (NORVASC) tablet 2.5 mg  2.5 mg Oral Q supper Jani Gravel, MD   2.5 mg at 02/15/19 1627  . aspirin EC tablet 81 mg  81 mg Oral Q supper Jani Gravel, MD   81 mg at 02/15/19 1626  . benzonatate (TESSALON) capsule 100 mg  100 mg Oral TID PRN Jani Gravel, MD      . brimonidine (ALPHAGAN) 0.2 % ophthalmic solution 1 drop  1 drop Both Eyes Q12H [place] Donnamarie Poag, DO   1 drop at 02/15/19 1433   And  . timolol (TIMOPTIC) 0.5 % ophthalmic solution 1 drop  1 drop Both Eyes Q12H [place] Donnamarie Poag, DO   1 drop at 02/15/19 1433  . cholecalciferol (VITAMIN D3) tablet 2,000 Units  2,000 Units Oral Daily Jani Gravel, MD   2,000 Units at 02/15/19 1014  . ferrous sulfate tablet 325 mg  325 mg Oral Q breakfast Jani Gravel, MD   325 mg at 02/15/19 0855  . latanoprost (XALATAN) 0.005 % ophthalmic solution 1 drop  1 drop Both Eyes QHS Jani Gravel, MD      . mesalamine (PENTASA) CR capsule 2,000 mg  2,000 mg Oral BID Jani Gravel, MD      . multivitamin with minerals tablet 1 tablet   1 tablet Oral Daily Jani Gravel, MD   1 tablet at 02/15/19 1014  . oxyCODONE (Oxy IR/ROXICODONE) immediate release tablet 5 mg  5 mg Oral Q6H PRN [place] Eric J, DO   5  mg at 02/15/19 1534  . pantoprazole (PROTONIX) EC tablet 40 mg  40 mg Oral Q1500 Jani Gravel, MD   40 mg at 02/15/19 1431  . pioglitazone (ACTOS) tablet 15 mg  15 mg Oral Q breakfast Jani Gravel, MD   15 mg at 02/15/19 0855  . piperacillin-tazobactam (ZOSYN) IVPB 3.375 g  3.375 g Intravenous Q8H Dorrene German, RPH 12.5 mL/hr at 02/15/19 1723 3.375 g at 02/15/19 1723  . predniSONE (DELTASONE) tablet 25 mg  25 mg Oral QPC breakfast Jani Gravel, MD   25 mg at 02/15/19 1013  . [START ON 02/16/2019] vancomycin (VANCOCIN) 1,500 mg in sodium chloride 0.9 % 500 mL IVPB  1,500 mg Intravenous Q0600 Dorrene German, [facility]      . vitamin B-12 (CYANOCOBALAMIN) tablet 2,500 mcg  2,500 mcg Oral Daily Jani Gravel, MD   2,500 mcg at 02/15/19 1014    ROS:   Neurologic: No dizziness, blackouts, seizures. No recent symptoms of stroke or mini- stroke. No recent episodes of slurred speech, or temporary blindness.  Cardiac: No recent episodes of chest pain/pressure, no shortness of breath at rest.  No shortness of breath with exertion.  Denies history of atrial fibrillation or irregular heartbeat  Pulmonary: No home oxygen, no productive cough, no hemoptysis,  No asthma or wheezing   Physical Examination  Vitals:   02/15/19 1100 02/15/19 1200 02/15/19 1306 02/15/19 1403  BP: 129/69 (!) 146/75 137/78   Pulse: 98 96 100   Resp: 13 16 18    Temp:   99.6 F (37.6 C)   TempSrc:   Oral   SpO2: 95% 99% 96% 96%  Weight:      Height:        Body mass index is 26.63 kg/m.  General:  Alert and oriented, no acute distress HEENT: Normal Cardiac: Regular Rate and Rhythm Skin: No rash       Extremity Pulses:  2+ radial, brachial, femoral, popliteal absent dorsalis pedis, posterior tibial pulses bilaterally Musculoskeletal: No deformity or  edema  Neurologic: Upper and lower extremity motor 5/5 and symmetric  DATA:  CBC    Component Value Date/Time   WBC 15.6 (H) 02/15/2019 0110   RBC 3.94 (L) 02/15/2019 0110   HGB 11.4 (L) 02/15/2019 0110   HCT 36.8 (L) 02/15/2019 0110   PLT 243 02/15/2019 0110   MCV 93.4 02/15/2019 0110   MCH 28.9 02/15/2019 0110   MCHC 31.0 02/15/2019 0110   RDW 13.4 02/15/2019 0110   LYMPHSABS 1.9 02/15/2019 0110   MONOABS 2.0 (H) 02/15/2019 0110   EOSABS 0.0 02/15/2019 0110   BASOSABS 0.0 02/15/2019 0110    BMET    Component Value Date/Time   NA 138 02/15/2019 0110   K 3.5 02/15/2019 0110   CL 103 02/15/2019 0110   CO2 23 02/15/2019 0110   GLUCOSE 139 (H) 02/15/2019 0110   BUN 17 02/15/2019 0110   CREATININE 0.95 02/15/2019 0110   CALCIUM 8.5 (L) 02/15/2019 0110   GFRNONAA >60 02/15/2019 0110   GFRAA >60 02/15/2019 0110     ASSESSMENT: Early gangrenous changes left foot.  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  He is at high risk for below-knee amputation or at a minimum transmetatarsal amputation   PLAN: Abdominal aortogram with lower extremity runoff by my partner Dr. Carlis Abbott on Wednesday, February 17, 2019.  Procedure details risk benefits possible complications were discussed with the patient today  again emphasizing that he is very high risk for limb loss.   Ruta Hinds, MD [facility] Office: [phone] Pager: [phone]

## 2019-02-15 NOTE — ED Notes (Signed)
Assisted patient with bedside commode.

## 2019-02-15 NOTE — Progress Notes (Signed)
NEW ADMISSION NOTE New Admission Note:   Arrival Method: stretcher from [facility] Mental Orientation: alert and oriented x4  Telemetry: box: NSR 06 Assessment: Completed Skin: intact  IV: LH LAC Pain: 0 Safety Measures: Safety Fall Prevention Plan has been given, discussed and signed Admission: Completed [facility] Orientation: Patient has been orientated to the room, unit and staff.  Family: NA  Orders have been reviewed and implemented. Will continue to monitor the patient. Call light has been placed within reach and bed alarm has been activated.   Baldo Ash, RN

## 2019-02-15 NOTE — Progress Notes (Signed)
Pharmacy Antibiotic Note  Rodney Sullivan is a 78 y.o. male admitted on 02/15/2019 with cellulitis.  Pharmacy has been consulted for zosyn and vancomycin dosing.  Plan: Zosyn 3.375g IV q8h (4 hour infusion).  Vancomycin 1750 mg x1 then 1500 mg IV q24h for est AUC = 484 Goal AUC = 400-550 Daily scr F/u cultures/levels  Height: 5' 7"  (170.2 cm) Weight: 170 lb (77.1 kg) IBW/kg (Calculated) : 66.1  Temp (24hrs), Avg:102.8 F (39.3 C), Min:102.8 F (39.3 C), Max:102.8 F (39.3 C)  Recent Labs  Lab 02/15/19 0110  WBC 15.6*  CREATININE 0.95  LATICACIDVEN 3.5*    Estimated Creatinine Clearance: 61.8 mL/min (by C-G formula based on SCr of 0.95 mg/dL).    Allergies  Allergen Reactions  . Ciprofloxacin Other (See Comments)    tendons hurt  . Shrimp [Shellfish Allergy]     Antimicrobials this admission: 8/10 cefepime/flagyl >> x1 ED 8/10 vancomycin >>  8/10 zosyn >>  Dose adjustments this admission:   Microbiology results:  BCx:   UCx:    Sputum:    MRSA PCR:   Thank you for allowing pharmacy to be a part of this patient's care.  Dorrene German 02/15/2019 3:13 AM

## 2019-02-15 NOTE — ED Notes (Signed)
This Probation officer called [facility] to get update on time of transport to [facility]. Per Marcello Moores at [place], transport "in a couple of hours." Industrial/product designer made aware of above information.

## 2019-02-15 NOTE — ED Notes (Signed)
Patient in MRI 

## 2019-02-15 NOTE — ED Notes (Signed)
Patient returned from MRI.

## 2019-02-15 NOTE — ED Notes (Signed)
Date and time results received: 02/15/19 2:25 AM  (use smartphrase ".now" to insert current time)  Test: Lactic Acid Critical Value: 3.5  Name of Provider Notified: Izell Washta. PA  Orders Received? Or Actions Taken?:

## 2019-02-15 NOTE — ED Notes (Signed)
Update from [facility] will be a couple hours until arrival.

## 2019-02-15 NOTE — ED Notes (Signed)
Patient left foot is warm to touch. Patient has a fever. Wife is at bedside. Patient states the foot infection has been going since Friday. He states his fever started today.

## 2019-02-15 NOTE — ED Notes (Addendum)
ED TO INPATIENT HANDOFF REPORT  ED Nurse Name and Phone #: Fredonia Highland [phone]  S Name/Age/Gender Rodney Sullivan 78 y.o. male Room/Bed: WA10/WA10  Code Status   Code Status: Full Code  Home/SNF/Other Home Patient oriented to: self, place, time and situation Is this baseline? Yes   Triage Complete: Triage complete  Chief Complaint Blood Infection  Triage Note Patient brought in by [facility]. Patient is complaining of fever. Patient left foot looks infected. Abdomen is distended. Patient has 400 mg of NS, 1000 mg of tylenol at 2300.   Allergies Allergies  Allergen Reactions  . Ciprofloxacin Other (See Comments)    tendons hurt  . Shrimp [Shellfish Allergy]     Level of Care/Admitting Diagnosis ED Disposition    ED Disposition Condition [facility] Area: [place] [100100]  Level of Care: Telemetry Medical [104]  Covid Evaluation: Asymptomatic Screening Protocol (No Symptoms)  Diagnosis: Sepsis [facility]) [[id number]]  Admitting Physician: Jani Gravel [[id number]]  Attending Physician: Jani Gravel [phone]  Estimated length of stay: past midnight tomorrow  Certification:: I certify this patient will need inpatient services for at least 2 midnights  PT Class (Do Not Modify): Inpatient [101]  PT Acc Code (Do Not Modify): Private [1]       B Medical/Surgery History Past Medical History:  Diagnosis Date  . Bacteremia due to Escherichia coli 11/2013  . Crohn disease ([place])   . DVT (deep venous thrombosis) ([place]) 11/27/13  . Pulmonary embolism ([place]) 11/27/13    Past Surgical History:  Procedure Laterality Date  . ABDOMINAL AORTOGRAM N/A 12/14/2018   Procedure: ABDOMINAL AORTOGRAM;  Surgeon: Waynetta Sandy, MD;  Location: [place] [facility];  Service: Cardiovascular;  Laterality: N/A;  . ABDOMINAL SURGERY     Bowel resection x2  . AMPUTATION Right 12/21/2018   Procedure: RIGHT LONG FINGER AMPUTATION;  Surgeon: Leanora Cover, MD;  Location: [place];  Service: Orthopedics;  Laterality: Right;  . CHOLECYSTECTOMY    . COLON SURGERY    . HIP SURGERY     BOTH  . JOINT REPLACEMENT    . LOWER EXTREMITY ANGIOGRAPHY Right 12/14/2018   Procedure: LOWER EXTREMITY ANGIOGRAPHY;  Surgeon: Waynetta Sandy, MD;  Location: [place] [facility];  Service: Cardiovascular;  Laterality: Right;  . SHOULDER SURGERY     RIGHT TENDON DETACHED  . UPPER EXTREMITY ANGIOGRAPHY Right 12/14/2018   Procedure: Right UPPER EXTREMITY ANGIOGRAPHY;  Surgeon: Waynetta Sandy, MD;  Location: [place] [facility];  Service: Cardiovascular;  Laterality: Right;     A IV Location/Drains/Wounds Patient Lines/Drains/Airways Status   Active Line/Drains/Airways    Name:   Placement date:   Placement time:   Site:   Days:   Peripheral IV 02/15/19 Anterior;Left;Proximal Forearm   02/15/19    -    Forearm   less than 1   Peripheral IV 02/15/19 Left Hand   02/15/19    0124    Hand   less than 1   Incision (Closed) 12/21/18 Hand Right   12/21/18    1436     56          Intake/Output Last 24 hours  Intake/Output Summary (Last 24 hours) at 02/15/2019 9150 Last data filed at 02/15/2019 0520 Gross per 24 hour  Intake 1700 ml  Output -  Net 1700 ml    Labs/Imaging Results for orders placed or performed during the hospital encounter of 02/15/19 (from the past 48 hour(s))  Comprehensive metabolic panel     Status: Abnormal   Collection Time: 02/15/19  1:10 AM  Result Value Ref Range   Sodium 138 135 - 145 mmol/L   Potassium 3.5 3.5 - 5.1 mmol/L   Chloride 103 98 - 111 mmol/L   CO2 23 22 - 32 mmol/L   Glucose, Bld 139 (H) 70 - 99 mg/dL   BUN 17 8 - 23 mg/dL   Creatinine, Ser 0.95 0.61 - 1.24 mg/dL   Calcium 8.5 (L) 8.9 - 10.3 mg/dL   Total Protein 6.1 (L) 6.5 - 8.1 g/dL   Albumin 3.1 (L) 3.5 - 5.0 g/dL   AST 35 15 - 41 U/L   ALT 26 0 - 44 U/L   Alkaline Phosphatase 43 38 - 126 U/L   Total Bilirubin 0.7 0.3 - 1.2 mg/dL   GFR calc non  Af Amer >60 >60 mL/min   GFR calc Af Amer >60 >60 mL/min   Anion gap 12 5 - 15    Comment: Performed at [facility], [place] [address], [place], [place] [address]  Lactic acid, plasma     Status: Abnormal   Collection Time: 02/15/19  1:10 AM  Result Value Ref Range   Lactic Acid, Venous 3.5 (HH) 0.5 - 1.9 mmol/L    Comment: CRITICAL RESULT CALLED TO, READ BACK BY AND VERIFIED WITH: RN Loletha Grayer HODGES AT 0226 02/15/19 CRUICKSHANK A Performed at [facility], [place] [address] [place], [place] [address]   CBC with Differential     Status: Abnormal   Collection Time: 02/15/19  1:10 AM  Result Value Ref Range   WBC 15.6 (H) 4.0 - 10.5 K/uL   RBC 3.94 (L) 4.22 - 5.81 MIL/uL   Hemoglobin 11.4 (L) 13.0 - 17.0 g/dL   HCT 36.8 (L) 39.0 - 52.0 %   MCV 93.4 80.0 - 100.0 fL   MCH 28.9 26.0 - 34.0 pg   MCHC 31.0 30.0 - 36.0 g/dL   RDW 13.4 11.5 - 15.5 %   Platelets 243 150 - 400 K/uL   nRBC 0.0 0.0 - 0.2 %   Neutrophils Relative % 74 %   Neutro Abs 11.6 (H) 1.7 - 7.7 K/uL   Lymphocytes Relative 12 %   Lymphs Abs 1.9 0.7 - 4.0 K/uL   Monocytes Relative 13 %   Monocytes Absolute 2.0 (H) 0.1 - 1.0 K/uL   Eosinophils Relative 0 %   Eosinophils Absolute 0.0 0.0 - 0.5 K/uL   Basophils Relative 0 %   Basophils Absolute 0.0 0.0 - 0.1 K/uL   Immature Granulocytes 1 %   Abs Immature Granulocytes 0.09 (H) 0.00 - 0.07 K/uL    Comment: Performed at [facility], [place] [address] [place], [place] [address]  Protime-INR     Status: None   Collection Time: 02/15/19  1:10 AM  Result Value Ref Range   Prothrombin Time 13.1 11.4 - 15.2 seconds   INR 1.0 0.8 - 1.2    Comment: (NOTE) INR goal varies based on device and disease states. Performed at [facility], [place] [address] [place],  [address]   Urinalysis, Routine w reflex microscopic     Status: Abnormal   Collection Time: 02/15/19  1:10 AM  Result Value Ref Range   Color,  Urine YELLOW YELLOW   APPearance CLEAR CLEAR   Specific Gravity, Urine 1.015 1.005 - 1.030   pH 6.0 5.0 - 8.0   Glucose, UA NEGATIVE  NEGATIVE mg/dL   Hgb urine dipstick NEGATIVE NEGATIVE   Bilirubin Urine NEGATIVE NEGATIVE   Ketones, ur 5 (A) NEGATIVE mg/dL   Protein, ur NEGATIVE NEGATIVE mg/dL   Nitrite NEGATIVE NEGATIVE   Leukocytes,Ua NEGATIVE NEGATIVE    Comment: Performed at [facility], [place] [address], [place], [place] [address]  APTT     Status: None   Collection Time: 02/15/19  1:51 AM  Result Value Ref Range   aPTT 25 24 - 36 seconds    Comment: Performed at [facility], [place] [address] [place], [place] [address]  Sedimentation rate     Status: Abnormal   Collection Time: 02/15/19  2:19 AM  Result Value Ref Range   Sed Rate 31 (H) 0 - 16 mm/hr    Comment: Performed at [facility], [place] [address], [place], [place] [address]  C-reactive protein     Status: Abnormal   Collection Time: 02/15/19  2:19 AM  Result Value Ref Range   CRP 10.3 (H) <1.0 mg/dL    Comment: Performed at [facility], [place] [address], [place],  [address]  SARS Coronavirus 2 [facility] order, Performed in [facility] hospital lab) Nasopharyngeal Nasopharyngeal Swab     Status: None   Collection Time: 02/15/19  2:52 AM   Specimen: Nasopharyngeal Swab  Result Value Ref Range   SARS Coronavirus 2 NEGATIVE NEGATIVE    Comment: (NOTE) If result is NEGATIVE SARS-CoV-2 target nucleic acids are NOT DETECTED. The SARS-CoV-2 RNA is generally detectable in upper and lower  respiratory specimens during the acute phase of infection. The lowest  concentration of SARS-CoV-2 viral copies this assay can detect is 250  copies / mL. A negative result does not preclude SARS-CoV-2 infection  and should not be used as the sole basis for treatment or other  patient management decisions.  A negative result may occur with  improper specimen  collection / handling, submission of specimen other  than nasopharyngeal swab, presence of viral mutation(s) within the  areas targeted by this assay, and inadequate number of viral copies  (<250 copies / mL). A negative result must be combined with clinical  observations, patient history, and epidemiological information. If result is POSITIVE SARS-CoV-2 target nucleic acids are DETECTED. The SARS-CoV-2 RNA is generally detectable in upper and lower  respiratory specimens dur ing the acute phase of infection.  Positive  results are indicative of active infection with SARS-CoV-2.  Clinical  correlation with patient history and other diagnostic information is  necessary to determine patient infection status.  Positive results do  not rule out bacterial infection or co-infection with other viruses. If result is PRESUMPTIVE POSTIVE SARS-CoV-2 nucleic acids MAY BE PRESENT.   A presumptive positive result was obtained on the submitted specimen  and confirmed on repeat testing.  While 2019 novel coronavirus  (SARS-CoV-2) nucleic acids may be present in the submitted sample  additional confirmatory testing may be necessary for epidemiological  and / or clinical management purposes  to differentiate between  SARS-CoV-2 and other Sarbecovirus currently known to infect humans.  If clinically indicated additional testing with an alternate test  methodology [phone]) is advised. The SARS-CoV-2 RNA is generally  detectable in upper and lower respiratory sp ecimens during the acute  phase of infection. The expected result is Negative. Fact Sheet for Patients:  StrictlyIdeas.no Fact Sheet for Healthcare Providers: BankingDealers.co.za This test is not yet approved or cleared by the [place] FDA and  has been authorized for detection and/or diagnosis of SARS-CoV-2 by FDA under an Emergency Use Authorization (EUA).  This EUA will remain in effect  (meaning this test can be used) for the duration of the COVID-19 declaration under Section 564(b)(1) of the Act, 21 U.S.C. section 360bbb-3(b)(1), unless the authorization is terminated or revoked sooner. Performed at [facility], [place] [address] [place], [place] [address]   Lactic acid, plasma     Status: Abnormal   Collection Time: 02/15/19  4:15 AM  Result Value Ref Range   Lactic Acid, Venous 3.0 (HH) 0.5 - 1.9 mmol/L    Comment: CRITICAL RESULT CALLED TO, READ BACK BY AND VERIFIED WITH: RN  Raynelle Chary AT 8786 02/15/19 CRUICKSHANK A Performed at [facility], [place] [address] [place], [place] [address]    Dg Chest 2 View  Result Date: 02/15/2019 CLINICAL DATA:  78 year old male with fever. Concern for sepsis. EXAM: CHEST - 2 VIEW COMPARISON:  Chest radiograph dated 07/15/2017 FINDINGS: Left lung base atelectatic changes. No focal consolidation, pleural effusion, or pneumothorax. Stable mild cardiomegaly. Atherosclerotic calcification of the aortic arch. No acute osseous pathology. IMPRESSION: No active cardiopulmonary disease.  No interval change. Electronically Signed   By: Anner Crete M.D.   On: 02/15/2019 02:02   Dg Foot 2 Views Left  Result Date: 02/15/2019 CLINICAL DATA:  Fever, foot infection EXAM: LEFT FOOT - 2 VIEW COMPARISON:  None. FINDINGS: No fracture or dislocation is seen. The joint spaces are preserved. No evidence of soft tissue gas. Vascular calcifications calcification. IMPRESSION: No radiographic findings to suggest acute osteomyelitis. No evidence of soft tissue gas. Electronically Signed   By: Julian Hy M.D.   On: 02/15/2019 03:02    Pending Labs Unresulted Labs (From admission, onward)    Start     Ordered   02/22/19 0500  Creatinine, serum  (enoxaparin (LOVENOX)    CrCl >/= 30 ml/min)  Weekly,   R    Comments: while on enoxaparin therapy    02/15/19 0257   02/16/19 0500  Comprehensive metabolic panel  Tomorrow  morning,   R     02/15/19 0257   02/16/19 0500  CBC  Tomorrow morning,   R     02/15/19 0257   02/16/19 0500  Creatinine, serum  Daily,   R     02/15/19 0315   02/15/19 0151  Urine culture  ONCE - STAT,   STAT     02/15/19 0152   02/15/19 0054  Culture, blood (Routine x 2)  BLOOD CULTURE X 2,   STAT     02/15/19 0053          Vitals/Pain Today's Vitals   02/15/19 0358 02/15/19 0400 02/15/19 0444 02/15/19 0630  BP: 109/70 101/70  109/75  Pulse: 91   93  Resp: 18 (!) 25  (!) 9  Temp:   99 F (37.2 C)   TempSrc:   Oral   SpO2: 98%   94%  Weight:      Height:      PainSc: 0-No pain       Isolation Precautions No active isolations  Medications Medications  enoxaparin (LOVENOX) injection 40 mg (has no administration in time range)  0.9 %  sodium chloride infusion (has no administration in time range)  acetaminophen (TYLENOL) tablet 650 mg (has no administration in time range)    Or  acetaminophen (TYLENOL) suppository 650 mg (has no administration in time range)  allopurinol (ZYLOPRIM)  tablet 100 mg (has no administration in time range)  aspirin EC tablet 81 mg (has no administration in time range)  amLODipine (NORVASC) tablet 2.5 mg (has no administration in time range)  pioglitazone (ACTOS) tablet 15 mg (has no administration in time range)  predniSONE (DELTASONE) tablet 25 mg (has no administration in time range)  mesalamine (PENTASA) CR capsule 2,000 mg (has no administration in time range)  pantoprazole (PROTONIX) EC tablet 40 mg (has no administration in time range)  vitamin B-12 (CYANOCOBALAMIN) tablet 2,500 mcg (has no administration in time range)  ferrous sulfate tablet 325 mg (has no administration in time range)  cholecalciferol (VITAMIN D3) tablet 2,000 Units (has no administration in time range)  multivitamin with minerals tablet 1 tablet (has no administration in time range)  albuterol (VENTOLIN HFA) 108 (90 Base) MCG/ACT inhaler 2 puff (has no  administration in time range)  benzonatate (TESSALON) capsule 100 mg (has no administration in time range)  albuterol (VENTOLIN HFA) 108 (90 Base) MCG/ACT inhaler 2 puff (has no administration in time range)  brimonidine-timolol (COMBIGAN) 0.2-0.5 % ophthalmic solution 1 drop (has no administration in time range)  latanoprost (XALATAN) 0.005 % ophthalmic solution 1 drop (has no administration in time range)  piperacillin-tazobactam (ZOSYN) IVPB 3.375 g (has no administration in time range)  vancomycin (VANCOCIN) 1,500 mg in sodium chloride 0.9 % 500 mL IVPB (has no administration in time range)  lactated ringers bolus 1,000 mL (0 mLs Intravenous Stopped 02/15/19 0359)  ceFEPIme (MAXIPIME) 2 g in sodium chloride 0.9 % 100 mL IVPB (0 g Intravenous Stopped 02/15/19 0322)  metroNIDAZOLE (FLAGYL) IVPB 500 mg (0 mg Intravenous Stopped 02/15/19 0327)  vancomycin (VANCOCIN) 1,750 mg in sodium chloride 0.9 % 500 mL IVPB (0 mg Intravenous Stopped 02/15/19 0520)    Mobility walks with person assist High fall risk   Focused Assessments Vascular   R Recommendations: See Admitting Provider Note  Report given to: Rodena Piety RN  Additional Notes:

## 2019-02-15 NOTE — Progress Notes (Signed)
A consult was received from an ED physician for cefepime and vancomycin per pharmacy dosing.  The patient's profile has been reviewed for ht/wt/allergies/indication/available labs.   A one time order has been placed for Cefepime 2 Gm  and Vancomycin 1750 mg.  Further antibiotics/pharmacy consults should be ordered by admitting physician if indicated.                       Thank you, Dorrene German 02/15/2019  2:03 AM

## 2019-02-15 NOTE — ED Notes (Signed)
Attempted to call patients wife. No answer.

## 2019-02-15 NOTE — ED Notes (Signed)
(  [phone] Rodney Sullivan, wife, given pt room # at [facility] 4M-04. Wife requesting RN to call when pt is transported to [facility]; primary RN made aware of request.

## 2019-02-15 NOTE — H&P (Signed)
TRH H&P    Patient Demographics:    Rodney Sullivan, is a 78 y.o. male  MRN: [id number]  DOB - 07/19/1940  Admit Date - 02/15/2019  Referring MD/NP/PA: [email or web address]  Outpatient Primary MD for the patient is System, Provider Not In Virgel Bouquet - primary care  Servando Snare - vascular Leanora Cover - orthopedics  Patient coming from:  home  Chief complaint-  cellulitis   HPI:    Rodney Sullivan  is a 78 y.o. male, w Crohns disease, h/o PE/DVT, h/o E. Coli sepsis, with PAD  w h/o  right middle finger ulceration and right 2nd toe ulceration, s/p angiogram 12/14/2018-> ulnar artery occluded, heavily calcified radial artery,  presents  Skin peeling about 1 week ago, skin red Wednesday, subjective fever tonight. Denies cough, cp, palp, sob, n/v, bowel movements regular (crohns), brbpr, dysuria, hematuria.    In ED,  T 102.8  P 115, R 23, Bp 154/80 pox 100% on RA Wt 77.1, kg   Na 138, K 3.5,  Bun 17, Creatinine 0.95 Ast 35, Alt 26 Alk phos 43, T. Bili 0.7 Alb 3.1 Glucose 139  Wbc 15.6, Hgb 11.4, Plt 243 INR 1.0  L foot xray IMPRESSION: No radiographic findings to suggest acute osteomyelitis. No evidence of soft tissue gas.  CXR IMPRESSION: No active cardiopulmonary disease.  No interval change.  Urinalysis negative  Pt will be admitted for cellulitis r/o osteomyelitis. and probable sepsis.     Review of systems:    In addition to the HPI above,  No Fever-chills, No Headache, No changes with Vision or hearing, No problems swallowing food or Liquids, No Chest pain, Cough or Shortness of Breath, No Abdominal pain, No Nausea or Vomiting, bowel movements are regular, No Blood in stool or Urine, No dysuria,   No new joints pains-aches,  No new weakness, tingling, numbness in any extremity, No recent weight gain or loss, No polyuria, polydypsia or polyphagia, No significant Mental Stressors.   All other systems reviewed and are negative.    Past History of the following :    Past Medical History:  Diagnosis Date  . Bacteremia due to Escherichia coli 11/2013  . Crohn disease ([place])   . DVT (deep venous thrombosis) ([place]) 11/27/13  . Pulmonary embolism ([place]) 11/27/13       Past Surgical History:  Procedure Laterality Date  . ABDOMINAL AORTOGRAM N/A 12/14/2018   Procedure: ABDOMINAL AORTOGRAM;  Surgeon: Waynetta Sandy, MD;  Location: [facility];  Service: Cardiovascular;  Laterality: N/A;  . ABDOMINAL SURGERY     Bowel resection x2  . AMPUTATION Right 12/21/2018   Procedure: RIGHT LONG FINGER AMPUTATION;  Surgeon: Leanora Cover, MD;  Location: [facility];  Service: Orthopedics;  Laterality: Right;  . CHOLECYSTECTOMY    . COLON SURGERY    . HIP SURGERY     BOTH  . JOINT REPLACEMENT    . LOWER EXTREMITY ANGIOGRAPHY Right 12/14/2018   Procedure: LOWER EXTREMITY ANGIOGRAPHY;  Surgeon: Waynetta Sandy, MD;  Location: [facility];  Service: Cardiovascular;  Laterality: Right;  . SHOULDER SURGERY     RIGHT TENDON DETACHED  . UPPER EXTREMITY ANGIOGRAPHY Right 12/14/2018   Procedure: Right UPPER EXTREMITY ANGIOGRAPHY;  Surgeon: Waynetta Sandy, MD;  Location: [facility];  Service: Cardiovascular;  Laterality: Right;      Social History:      Social History   Tobacco Use  . Smoking status: Former Smoker    Years: 10.00    Types: Pipe  . Smokeless tobacco: Former Systems developer    Quit date: 12/08/1979  Substance Use Topics  . Alcohol use: No    Alcohol/week: 0.0 standard drinks       Family History :    History reviewed. No pertinent family history.  no family hx of IBD   Home Medications:   Prior to Admission medications   Medication Sig Start Date End Date Taking? Authorizing Provider  albuterol (VENTOLIN HFA) 108 (90 Base) MCG/ACT inhaler Inhale 2 puffs into the lungs every 4 (four) hours as needed for  wheezing or shortness of breath.  11/20/18  Yes [provider]  allopurinol (ZYLOPRIM) 100 MG tablet Take 100 mg by mouth daily.   Yes [provider]  amLODipine (NORVASC) 2.5 MG tablet Take 2.5 mg by mouth daily with supper.  11/18/18  Yes [provider]  aspirin EC 81 MG tablet Take 81 mg by mouth daily with supper.    Yes [provider]  benzonatate (TESSALON) 100 MG capsule Take 100 mg by mouth 3 (three) times daily as needed for cough.   Yes [provider]  brimonidine-timolol (COMBIGAN) 0.2-0.5 % ophthalmic solution Place 1 drop into both eyes 2 (two) times a day.   Yes [provider]  Certolizumab Pegol (CIMZIA) 2 X 200 MG KIT Inject 400 mg into the skin every 28 (twenty-eight) days.   Yes [provider]  Cholecalciferol (VITAMIN D) 50 MCG (2000 UT) tablet Take 2,000 Units by mouth daily.   Yes [provider]  clotrimazole-betamethasone (LOTRISONE) cream Apply 1 application topically 2 (two) times daily as needed (fungus). On the side of mouth 09/05/14  Yes [provider]  Cyanocobalamin (B-12) 2500 MCG TABS Take 2,500 mcg by mouth daily.   Yes [provider]  cyclobenzaprine (FLEXERIL) 10 MG tablet Take 10 mg by mouth daily as needed for muscle spasms.   Yes [provider]  Dexamethasone Acetate 8 MG/ML SUSP Inject 8 mg into the muscle daily as needed (for Crohn's flare).   Yes [provider]  diphenhydrAMINE (BENADRYL) 25 MG tablet Take 25 mg by mouth 2 (two) times daily as needed for allergies or sleep.    Yes [provider]  Emollient (ZIMS CRACK CREME) CREA Apply 1 application topically daily as needed (dry skin).    Yes [provider]  famciclovir (FAMVIR) 250 MG tablet Take 250 mg by mouth daily as needed (fungus on corner of mouth flare).  11/18/18  Yes [provider]  ferrous sulfate 325 (65 FE) MG tablet Take 325 mg by mouth daily.   Yes  [provider]  ketoconazole (NIZORAL) 200 MG tablet Take 200 mg by mouth daily as needed (yeast infection).   Yes [provider]  latanoprost (XALATAN) 0.005 % ophthalmic solution Place 1 drop into both eyes at bedtime. 11/11/14  Yes [provider]  mesalamine (PENTASA) 500 MG CR capsule Take 2,000 mg by mouth 2 (two) times daily.   Yes [provider]  Multiple Vitamin (MULTIVITAMIN WITH MINERALS) TABS Take 1 tablet by mouth daily.   Yes [provider]  Omega-3 Fatty Acids (FISH OIL) 1000 MG CAPS Take 1,000 mg by mouth daily.   Yes [provider]  OVER THE COUNTER MEDICATION Take 5 mLs by mouth 2 (two) times daily. CBD oil drops   Yes [provider]  pantoprazole (PROTONIX) 40 MG tablet Take 40 mg by mouth daily at 3 pm.    Yes [provider]  pioglitazone (ACTOS) 15 MG tablet Take 15 mg by mouth daily.  01/13/19  Yes [provider]  predniSONE (DELTASONE) 10 MG tablet Take 25 mg by mouth daily after breakfast.    Yes [provider]  sulfamethoxazole-trimethoprim (BACTRIM DS) 800-160 MG tablet Take 1 tablet by mouth daily as needed (take after recieving dexamethazone injection). Take for 10 days as needed for crohn's disease flare   Yes [provider]  HYDROcodone-acetaminophen (NORCO) 5-325 MG tablet 1-2 tabs po q6 hours prn pain Patient not taking: Reported on 02/15/2019 12/21/18   Leanora Cover, MD     Allergies:     Allergies  Allergen Reactions  . Ciprofloxacin Other (See Comments)    tendons hurt  . Shrimp [Shellfish Allergy]      Physical Exam:   Vitals  Blood pressure 109/70, pulse 91, temperature (!) 102.8 F (39.3 C), temperature source Rectal, resp. rate 18, height 5' 7"  (1.702 m), weight 77.1 kg, SpO2 98 %.  1.  General:  axoxo3  2. Psychiatric: euthymic  3. Neurologic: cn2-12 intact, reflexes 2+ symmetric, diffuse with no clonus,    4. HEENMT:  Anicteric,  pupils 1.52m symmetric, direct consensual, near intact Neck: no jvd, no bruit, no tm  5. Respiratory : Slight crackles right lung base, , no wheezing,   6. Cardiovascular : rrr s1, s2, 2/6 sem apex  7. Gastrointestinal:  Abd: soft, obese, nt, nd, +bs  8. Skin:  Scab on the 3rd toe right foot Skin sloughing off on the toes 1-5 on the left foot,  Redness of all 5 toes, and dark red area on the tip of the 1st toe right foot,  Subtle redness extends almost up to knee   9.Musculoskeletal:  Good ROM  No adenopathy    Data Review:    CBC Recent Labs  Lab 02/15/19 0110  WBC 15.6*  HGB 11.4*  HCT 36.8*  PLT 243  MCV 93.4  MCH 28.9  MCHC 31.0  RDW 13.4  LYMPHSABS 1.9  MONOABS 2.0*  EOSABS 0.0  BASOSABS 0.0   ------------------------------------------------------------------------------------------------------------------  Results for orders placed or performed during the hospital encounter of 02/15/19 (from the past 48 hour(s))  Comprehensive metabolic panel     Status: Abnormal   Collection Time: 02/15/19  1:10 AM  Result Value Ref Range   Sodium 138 135 - 145 mmol/L   Potassium 3.5 3.5 - 5.1 mmol/L   Chloride 103 98 - 111 mmol/L   CO2 23 22 - 32 mmol/L   Glucose, Bld 139 (H) 70 - 99 mg/dL   BUN 17 8 - 23 mg/dL   Creatinine, Ser 0.95 0.61 - 1.24 mg/dL   Calcium 8.5 (L) 8.9 - 10.3 mg/dL   Total Protein 6.1 (L) 6.5 - 8.1 g/dL   Albumin 3.1 (L) 3.5 - 5.0 g/dL   AST 35 15 - 41 U/L   ALT 26 0 - 44 U/L   Alkaline Phosphatase 43 38 - 126 U/L   Total  Bilirubin 0.7 0.3 - 1.2 mg/dL   GFR calc non Af Amer >60 >60 mL/min   GFR calc Af Amer >60 >60 mL/min   Anion gap 12 5 - 15    Comment: Performed at [facility], [place] [address], [place], [place] [address]  Lactic acid, plasma     Status: Abnormal   Collection Time: 02/15/19  1:10 AM  Result Value Ref Range   Lactic Acid, Venous 3.5 (HH) 0.5 - 1.9 mmol/L    Comment: CRITICAL RESULT CALLED TO, READ BACK  BY AND VERIFIED WITH: RN Loletha Grayer HODGES AT 0226 02/15/19 CRUICKSHANK A Performed at [facility], [place] [address], [place], [place] [address]   CBC with Differential     Status: Abnormal   Collection Time: 02/15/19  1:10 AM  Result Value Ref Range   WBC 15.6 (H) 4.0 - 10.5 K/uL   RBC 3.94 (L) 4.22 - 5.81 MIL/uL   Hemoglobin 11.4 (L) 13.0 - 17.0 g/dL   HCT 36.8 (L) 39.0 - 52.0 %   MCV 93.4 80.0 - 100.0 fL   MCH 28.9 26.0 - 34.0 pg   MCHC 31.0 30.0 - 36.0 g/dL   RDW 13.4 11.5 - 15.5 %   Platelets 243 150 - 400 K/uL   nRBC 0.0 0.0 - 0.2 %   Neutrophils Relative % 74 %   Neutro Abs 11.6 (H) 1.7 - 7.7 K/uL   Lymphocytes Relative 12 %   Lymphs Abs 1.9 0.7 - 4.0 K/uL   Monocytes Relative 13 %   Monocytes Absolute 2.0 (H) 0.1 - 1.0 K/uL   Eosinophils Relative 0 %   Eosinophils Absolute 0.0 0.0 - 0.5 K/uL   Basophils Relative 0 %   Basophils Absolute 0.0 0.0 - 0.1 K/uL   Immature Granulocytes 1 %   Abs Immature Granulocytes 0.09 (H) 0.00 - 0.07 K/uL    Comment: Performed at [facility], [place] [address], [place], [place] [address]  Protime-INR     Status: None   Collection Time: 02/15/19  1:10 AM  Result Value Ref Range   Prothrombin Time 13.1 11.4 - 15.2 seconds   INR 1.0 0.8 - 1.2    Comment: (NOTE) INR goal varies based on device and disease states. Performed at [facility], [place] [address] [place], [place] [address]   Urinalysis, Routine w reflex microscopic     Status: Abnormal   Collection Time: 02/15/19  1:10 AM  Result Value Ref Range   Color, Urine YELLOW YELLOW   APPearance CLEAR CLEAR   Specific Gravity, Urine 1.015 1.005 - 1.030   pH 6.0 5.0 - 8.0   Glucose, UA NEGATIVE NEGATIVE mg/dL   Hgb urine dipstick NEGATIVE NEGATIVE   Bilirubin Urine NEGATIVE NEGATIVE   Ketones, ur 5 (A) NEGATIVE mg/dL   Protein, ur NEGATIVE NEGATIVE mg/dL   Nitrite NEGATIVE NEGATIVE   Leukocytes,Ua NEGATIVE NEGATIVE    Comment: Performed  at [place] [address], [place], [place] [address]  APTT     Status: None   Collection Time: 02/15/19  1:51 AM  Result Value Ref Range   aPTT 25 24 - 36 seconds    Comment: Performed at [facility], [place] [address] [place], [place] [address]  Sedimentation rate     Status: Abnormal   Collection Time: 02/15/19  2:19 AM  Result Value Ref Range   Sed Rate 31 (H) 0 - 16 mm/hr  Comment: Performed at [facility], [place] [address] [place], [place] [address]  C-reactive protein     Status: Abnormal   Collection Time: 02/15/19  2:19 AM  Result Value Ref Range   CRP 10.3 (H) <1.0 mg/dL    Comment: Performed at [facility], [place] [address], [place], [place] [address]  SARS Coronavirus 2 [facility] order, Performed in [facility] hospital lab) Nasopharyngeal Nasopharyngeal Swab     Status: None   Collection Time: 02/15/19  2:52 AM   Specimen: Nasopharyngeal Swab  Result Value Ref Range   SARS Coronavirus 2 NEGATIVE NEGATIVE    Comment: (NOTE) If result is NEGATIVE SARS-CoV-2 target nucleic acids are NOT DETECTED. The SARS-CoV-2 RNA is generally detectable in upper and lower  respiratory specimens during the acute phase of infection. The lowest  concentration of SARS-CoV-2 viral copies this assay can detect is 250  copies / mL. A negative result does not preclude SARS-CoV-2 infection  and should not be used as the sole basis for treatment or other  patient management decisions.  A negative result may occur with  improper specimen collection / handling, submission of specimen other  than nasopharyngeal swab, presence of viral mutation(s) within the  areas targeted by this assay, and inadequate number of viral copies  (<250 copies / mL). A negative result must be combined with clinical  observations, patient history, and epidemiological information. If result is POSITIVE SARS-CoV-2 target nucleic acids are  DETECTED. The SARS-CoV-2 RNA is generally detectable in upper and lower  respiratory specimens dur ing the acute phase of infection.  Positive  results are indicative of active infection with SARS-CoV-2.  Clinical  correlation with patient history and other diagnostic information is  necessary to determine patient infection status.  Positive results do  not rule out bacterial infection or co-infection with other viruses. If result is PRESUMPTIVE POSTIVE SARS-CoV-2 nucleic acids MAY BE PRESENT.   A presumptive positive result was obtained on the submitted specimen  and confirmed on repeat testing.  While 2019 novel coronavirus  (SARS-CoV-2) nucleic acids may be present in the submitted sample  additional confirmatory testing may be necessary for epidemiological  and / or clinical management purposes  to differentiate between  SARS-CoV-2 and other Sarbecovirus currently known to infect humans.  If clinically indicated additional testing with an alternate test  methodology [phone]) is advised. The SARS-CoV-2 RNA is generally  detectable in upper and lower respiratory sp ecimens during the acute  phase of infection. The expected result is Negative. Fact Sheet for Patients:  StrictlyIdeas.no Fact Sheet for Healthcare Providers: BankingDealers.co.za This test is not yet approved or cleared by the [place] FDA and has been authorized for detection and/or diagnosis of SARS-CoV-2 by FDA under an Emergency Use Authorization (EUA).  This EUA will remain in effect (meaning this test can be used) for the duration of the COVID-19 declaration under Section 564(b)(1) of the Act, 21 U.S.C. section 360bbb-3(b)(1), unless the authorization is terminated or revoked sooner. Performed at [facility], [place] [address], [place], [place] [address]     Chemistries  Recent Labs  Lab 02/15/19 0110  NA 138  K 3.5  CL 103  CO2 23   GLUCOSE 139*  BUN 17  CREATININE 0.95  CALCIUM 8.5*  AST 35  ALT 26  ALKPHOS 43  BILITOT 0.7   ------------------------------------------------------------------------------------------------------------------  ------------------------------------------------------------------------------------------------------------------ GFR: Estimated Creatinine Clearance: 61.8 mL/min (by C-G formula based on SCr of 0.95 mg/dL). Liver  Function Tests: Recent Labs  Lab 02/15/19 0110  AST 35  ALT 26  ALKPHOS 43  BILITOT 0.7  PROT 6.1*  ALBUMIN 3.1*   No results for input(s): LIPASE, AMYLASE in the last 168 hours. No results for input(s): AMMONIA in the last 168 hours. Coagulation Profile: Recent Labs  Lab 02/15/19 0110  INR 1.0   Cardiac Enzymes: No results for input(s): CKTOTAL, CKMB, CKMBINDEX, TROPONINI in the last 168 hours. BNP (last 3 results) No results for input(s): PROBNP in the last 8760 hours. HbA1C: No results for input(s): HGBA1C in the last 72 hours. CBG: No results for input(s): GLUCAP in the last 168 hours. Lipid Profile: No results for input(s): CHOL, HDL, LDLCALC, TRIG, CHOLHDL, LDLDIRECT in the last 72 hours. Thyroid Function Tests: No results for input(s): TSH, T4TOTAL, FREET4, T3FREE, THYROIDAB in the last 72 hours. Anemia Panel: No results for input(s): VITAMINB12, FOLATE, FERRITIN, TIBC, IRON, RETICCTPCT in the last 72 hours.  --------------------------------------------------------------------------------------------------------------- Urine analysis:    Component Value Date/Time   COLORURINE YELLOW 02/15/2019 0110   APPEARANCEUR CLEAR 02/15/2019 0110   LABSPEC 1.015 02/15/2019 0110   PHURINE 6.0 02/15/2019 0110   GLUCOSEU NEGATIVE 02/15/2019 0110   HGBUR NEGATIVE 02/15/2019 0110   BILIRUBINUR NEGATIVE 02/15/2019 0110   KETONESUR 5 (A) 02/15/2019 0110   PROTEINUR NEGATIVE 02/15/2019 0110   UROBILINOGEN 0.2 12/08/2014 1356   NITRITE NEGATIVE  02/15/2019 0110   LEUKOCYTESUR NEGATIVE 02/15/2019 0110      Imaging Results:    Dg Chest 2 View  Result Date: 02/15/2019 CLINICAL DATA:  78 year old male with fever. Concern for sepsis. EXAM: CHEST - 2 VIEW COMPARISON:  Chest radiograph dated 07/15/2017 FINDINGS: Left lung base atelectatic changes. No focal consolidation, pleural effusion, or pneumothorax. Stable mild cardiomegaly. Atherosclerotic calcification of the aortic arch. No acute osseous pathology. IMPRESSION: No active cardiopulmonary disease.  No interval change. Electronically Signed   By: Anner Crete M.D.   On: 02/15/2019 02:02   Dg Foot 2 Views Left  Result Date: 02/15/2019 CLINICAL DATA:  Fever, foot infection EXAM: LEFT FOOT - 2 VIEW COMPARISON:  None. FINDINGS: No fracture or dislocation is seen. The joint spaces are preserved. No evidence of soft tissue gas. Vascular calcifications calcification. IMPRESSION: No radiographic findings to suggest acute osteomyelitis. No evidence of soft tissue gas. Electronically Signed   By: Julian Hy M.D.   On: 02/15/2019 03:02    ST at 110, borderline LAD, early R progression, no st-t changes c/w ischemia   Assessment & Plan:    Principal Problem:   Sepsis ([place]) Active Problems:   Crohn disease ([place])   Cellulitis   Anemia   Sepsis  (Fever, tachycardia, elevated wbc) Blood culture x2 pending ESR pending vanco iv, zosyn iv pharmacy to dose  Cellulitis r/o osteomyelitis Check MRI left foot Consider orthopedic or vascular consult in AM  Cardiac murmer suspect MR Cardiac echo  Crohns disease Cont prednisone 83m po qday Cont mesalamine 20060mpo bid Cont Cimzia as outpatient  Anemia Cont vitamin B12 Cont ferrous sulfate  Hypertension Cont Amlodipine  Gerd Cont PPI  Gout Cont Allopurinol 10085mo qday  Glaucoma Cont eye GTT  ? Asthma Cont Albuterol 2puff tid and q6h prn   DVT Prophylaxis-   Lovenox - SCDs   AM Labs Ordered, also please review  Full Orders  Family Communication: Admission, patients condition and plan of care including tests being ordered have been discussed with the patient and wife who indicate understanding and agree with  the plan and Code Status.  Code Status:  FULL CODE,  Wife present at bedside  Admission status: Inpatient: Based on patients clinical presentation and evaluation of above clinical data, I have made determination that patient meets Inpatient criteria at this time.  Pt will require iv abx, for sepsis,  Pt will require > 2 nites stay, and inpatient admission .  Time spent in minutes :  70 minutes   Jani Gravel M.D on 02/15/2019 at 4:15 AM

## 2019-02-15 NOTE — ED Notes (Addendum)
Paged pharmacy for correct dose on PENTSA. Only had 1,066m in pyxis.

## 2019-02-15 NOTE — ED Notes (Signed)
CARELINK at bedside.

## 2019-02-15 NOTE — ED Triage Notes (Signed)
Patient brought in by [facility]. Patient is complaining of fever. Patient left foot looks infected. Abdomen is distended. Patient has 400 mg of NS, 1000 mg of tylenol at 2300.

## 2019-02-15 NOTE — Progress Notes (Signed)
PROGRESS NOTE  Rodney Sullivan QXI:[id number] DOB: 09-26-1940 DOA: 02/15/2019 PCP: System, Provider Not In  Brief History   79 year old man PMH includes Crohn's disease, PE, DVT, peripheral arterial disease with right middle finger ulceration right second toe ulceration presented with left foot pain and concern for left foot infection.  EDP exam left lower extremity erythema, left foot cold with discolored toes.  Dopplerable pulse.  Admitted for sepsis secondary to left foot cellulitis complicated by known peripheral arterial disease  A & P  Sepsis secondary to left foot cellulitis complicated by peripheral arterial disease --Lactic acid trending down, hemodynamic stable.  Continue antibiotics. --MRI to rule out osteomyelitis, abscess done, interpretation pending --Vascular surgery consulted  Peripheral arterial disease followed by Dr. Donzetta Matters last seen 7/24.  History of amputation right finger.  Known second toe ulceration.  Amputation of second toe could be considered in the future but remains high risk for proximal limb loss right lower extremity.  Ongoing wound care of bilateral feet.  Has follow-up in a few months. --As above vascular surgery consulted  Murmur --Echocardiogram ordered by admitting physician.  Diabetes mellitus type 2 --Hold Actos.  Blood sugar stable.  Anion gap within normal limits.  Continue sliding scale insulin.  Crohn's disease --Continue daily prednisone, mesalamine  Essential hypertension --Hold amlodipine  Gout --Continue allopurinol  Glaucoma --Continue eyedrops  . Continue antibiotics, transfer to, pending.  Follow-up MRI results.  Follow-up vascular surgery recommendations.  Resolved Hospital Problem list       DVT prophylaxis: SCD right leg Code Status: full Family Communication: none Disposition Plan: pending    Murray Hodgkins, MD  [facility] Direct contact: see [email or web address] (further directions at bottom of note if needed) 7PM-7AM  contact night coverage as at bottom of note 02/15/2019, 9:22 AM  LOS: 0 days   Significant Hospital Events   . 8/10 admitted for sepsis, left foot cellulitis   Consults:  Marland Kitchen [facility]   Procedures:  .   Significant Diagnostic Tests:  . 8/10 chest x-ray no acute disease, independently reviewed . 8/10 left foot x-ray no acute abnormalities . 8/10 MRI left foot pending . 8/10 EKG sinus tachycardia, no acute changes   Micro Data:  . SARS-CoV-2 negative . 8/10 blood cultures > . 8/10 urine culture >   Antimicrobials:  . Cefepime 8/9 > . Vancomycin 8/9 >   Interval History/Subjective  Feels better.  Left leg pain is returning now.  Objective   Vitals:  Vitals:   02/15/19 0645 02/15/19 0854  BP:  (!) 143/87  Pulse: 97 (!) 103  Resp: 20 (!) 24  Temp:    SpO2: 96% 96%    Exam:  Constitutional:  . Appears calm and comfortable lying on stretcher in the emergency department ENMT:  . grossly normal hearing  Respiratory:  . CTA bilaterally, no w/r/r.  . Respiratory effort normal.  Cardiovascular:  . RRR, no m/r/g . No RLE extremity edema, 1+ left lower extremity edema Skin:  . Mild erythema left lower leg, foot.  Ulcerations multiple toes left foot with blanching of the toes.  Dry heel ulcer noted. . Right foot appears unremarkable except for dry ulcer third toe . Skin is nontender Psychiatric:  . Mental status o Mood, affect appropriate                I have personally reviewed the following:   Today's Data  . Lactic acid trending down, 3.0 . CRP 10.3 . ESR 31 . MRI left foot pending  Scheduled Meds: . albuterol  2 puff Inhalation TID  . allopurinol  100 mg Oral Daily  . amLODipine  2.5 mg Oral Q supper  . aspirin EC  81 mg Oral Q supper  . brimonidine-timolol  1 drop Both Eyes Q12H  . cholecalciferol  2,000 Units Oral Daily  . enoxaparin (LOVENOX) injection  40 mg Subcutaneous Daily  . ferrous sulfate  325 mg Oral Q breakfast  . latanoprost   1 drop Both Eyes QHS  . mesalamine  2,000 mg Oral BID  . multivitamin with minerals  1 tablet Oral Daily  . pantoprazole  40 mg Oral Q1500  . pioglitazone  15 mg Oral Q breakfast  . predniSONE  25 mg Oral QPC breakfast  . vitamin B-12  2,500 mcg Oral Daily   Continuous Infusions: . sodium chloride    . piperacillin-tazobactam (ZOSYN)  IV    . [START ON 02/16/2019] vancomycin      Principal Problem:   Sepsis ([place]) Active Problems:   Crohn disease ([place])   Cellulitis and abscess of toe of left foot   Peripheral arterial disease ([facility])   DM (diabetes mellitus), type 2 with peripheral vascular complications ([place])   Gout   LOS: 0 days   How to contact the [facility] Attending or Consulting provider [place] or covering provider during after hours [place], for this patient?  1. Check the care team in [facility] and look for a) attending/consulting [facility] provider listed and b) the [facility] team listed 2. Log into [email or web address] and use [place]'s universal password to access. If you do not have the password, please contact the hospital operator. 3. Locate the [facility] provider you are looking for under [facility] and page to a number that you can be directly reached. 4. If you still have difficulty reaching the provider, please page the [facility] (Director on Call) for the Hospitalists listed on amion for assistance.   Time: 8:30-0925

## 2019-02-15 NOTE — ED Notes (Signed)
Paged pharmacy for eye drops.

## 2019-02-15 NOTE — Plan of Care (Signed)
  Problem: Education: Goal: Knowledge of General Education information will improve Description Including pain rating scale, medication(s)/side effects and non-pharmacologic comfort measures Outcome: Progressing   

## 2019-02-15 NOTE — Progress Notes (Signed)
Pt wife called and would like for the Vascular Surgeon to call her.

## 2019-02-15 NOTE — ED Provider Notes (Signed)
[facility] DEPT Provider Note   CSN: [id number] Arrival date & time: 02/15/19  0049    History   Chief Complaint Chief Complaint  Patient presents with   Blood Infection    HPI Rodney Sullivan is a 78 y.o. male.     HPI 78 year old male comes in a chief complaint of foot pain.  Patient has history of Crohn's disease, PE, DVT.  He also has history of PAD and has required amputation of his right hand digit.  He states that he has had off-and-on pain in his left foot for several days now, that got worse today.  The pain is not constant and covers the entire leg below the knee.  The wife notes that patient had discoloration of his toes which got her concerned.  Patient states that last week he started having peeling from the skin or his toes that has gotten worse.  He is noted to have fevers.  Past Medical History:  Diagnosis Date   Bacteremia due to Escherichia coli 11/2013   Crohn disease ([place])    DVT (deep venous thrombosis) ([place]) 11/27/13   Pulmonary embolism ([place]) 11/27/13     Patient Active Problem List   Diagnosis Date Noted   Sepsis ([place]) 02/15/2019   Cellulitis 02/15/2019   Anemia 02/15/2019   Edema 12/11/2014   Hypokalemia 12/11/2014   Acute kidney injury ([place]) 12/11/2014   Septic shock ([place]) 12/14/2013   Pulmonary embolism ([place]) 12/14/2013   Crohn disease ([place]) 12/14/2013    Past Surgical History:  Procedure Laterality Date   ABDOMINAL AORTOGRAM N/A 12/14/2018   Procedure: ABDOMINAL AORTOGRAM;  Surgeon: Waynetta Sandy, MD;  Location: [facility];  Service: Cardiovascular;  Laterality: N/A;   ABDOMINAL SURGERY     Bowel resection x2   AMPUTATION Right 12/21/2018   Procedure: RIGHT LONG FINGER AMPUTATION;  Surgeon: Leanora Cover, MD;  Location: [facility];  Service: Orthopedics;  Laterality: Right;   CHOLECYSTECTOMY     COLON SURGERY     HIP SURGERY     BOTH   JOINT REPLACEMENT      LOWER EXTREMITY ANGIOGRAPHY Right 12/14/2018   Procedure: LOWER EXTREMITY ANGIOGRAPHY;  Surgeon: Waynetta Sandy, MD;  Location: [facility];  Service: Cardiovascular;  Laterality: Right;   SHOULDER SURGERY     RIGHT TENDON DETACHED   UPPER EXTREMITY ANGIOGRAPHY Right 12/14/2018   Procedure: Right UPPER EXTREMITY ANGIOGRAPHY;  Surgeon: Waynetta Sandy, MD;  Location: [facility];  Service: Cardiovascular;  Laterality: Right;        Home Medications    Prior to Admission medications   Medication Sig Start Date End Date Taking? Authorizing Provider  albuterol (VENTOLIN HFA) 108 (90 Base) MCG/ACT inhaler Inhale 2 puffs into the lungs every 4 (four) hours as needed for wheezing or shortness of breath.  11/20/18  Yes [provider]  allopurinol (ZYLOPRIM) 100 MG tablet Take 100 mg by mouth daily.   Yes [provider]  amLODipine (NORVASC) 2.5 MG tablet Take 2.5 mg by mouth daily with supper.  11/18/18  Yes [provider]  aspirin EC 81 MG tablet Take 81 mg by mouth daily with supper.    Yes [provider]  benzonatate (TESSALON) 100 MG capsule Take 100 mg by mouth 3 (three) times daily as needed for cough.   Yes [provider]  brimonidine-timolol (COMBIGAN) 0.2-0.5 % ophthalmic solution Place 1 drop into both eyes 2 (two) times  a day.   Yes [provider]  Certolizumab Pegol (CIMZIA) 2 X 200 MG KIT Inject 400 mg into the skin every 28 (twenty-eight) days.   Yes [provider]  Cholecalciferol (VITAMIN D) 50 MCG (2000 UT) tablet Take 2,000 Units by mouth daily.   Yes [provider]  clotrimazole-betamethasone (LOTRISONE) cream Apply 1 application topically 2 (two) times daily as needed (fungus). On the side of mouth 09/05/14  Yes [provider]  Cyanocobalamin (B-12) 2500 MCG TABS Take 2,500 mcg by mouth daily.   Yes [provider]  cyclobenzaprine (FLEXERIL) 10 MG  tablet Take 10 mg by mouth daily as needed for muscle spasms.   Yes [provider]  Dexamethasone Acetate 8 MG/ML SUSP Inject 8 mg into the muscle daily as needed (for Crohn's flare).   Yes [provider]  diphenhydrAMINE (BENADRYL) 25 MG tablet Take 25 mg by mouth 2 (two) times daily as needed for allergies or sleep.    Yes [provider]  Emollient (ZIMS CRACK CREME) CREA Apply 1 application topically daily as needed (dry skin).    Yes [provider]  famciclovir (FAMVIR) 250 MG tablet Take 250 mg by mouth daily as needed (fungus on corner of mouth flare).  11/18/18  Yes [provider]  ferrous sulfate 325 (65 FE) MG tablet Take 325 mg by mouth daily.   Yes [provider]  ketoconazole (NIZORAL) 200 MG tablet Take 200 mg by mouth daily as needed (yeast infection).   Yes [provider]  latanoprost (XALATAN) 0.005 % ophthalmic solution Place 1 drop into both eyes at bedtime. 11/11/14  Yes [provider]  mesalamine (PENTASA) 500 MG CR capsule Take 2,000 mg by mouth 2 (two) times daily.   Yes [provider]  Multiple Vitamin (MULTIVITAMIN WITH MINERALS) TABS Take 1 tablet by mouth daily.   Yes [provider]  Omega-3 Fatty Acids (FISH OIL) 1000 MG CAPS Take 1,000 mg by mouth daily.   Yes [provider]  OVER THE COUNTER MEDICATION Take 5 mLs by mouth 2 (two) times daily. CBD oil drops   Yes [provider]  pantoprazole (PROTONIX) 40 MG tablet Take 40 mg by mouth daily at 3 pm.    Yes [provider]  pioglitazone (ACTOS) 15 MG tablet Take 15 mg by mouth daily.  01/13/19  Yes [provider]  predniSONE (DELTASONE) 10 MG tablet Take 25 mg by mouth daily after breakfast.    Yes [provider]  sulfamethoxazole-trimethoprim (BACTRIM DS) 800-160 MG tablet Take 1 tablet by mouth daily as needed (take after recieving dexamethazone injection). Take for 10 days as  needed for crohn's disease flare   Yes [provider]  HYDROcodone-acetaminophen (NORCO) 5-325 MG tablet 1-2 tabs po q6 hours prn pain Patient not taking: Reported on 02/15/2019 12/21/18   Leanora Cover, MD    Family History Family History  Problem Relation Age of Onset   Hypertension Mother     Social History Social History   Tobacco Use   Smoking status: Former Smoker    Years: 10.00    Types: Pipe   Smokeless tobacco: Former Systems developer    Quit date: 12/08/1979  Substance Use Topics   Alcohol use: No    Alcohol/week: 0.0 standard drinks   Drug use: No     Allergies   Ciprofloxacin and Shrimp [shellfish allergy]   Review of Systems Review of Systems  Constitutional: Positive for activity change.  Gastrointestinal: Negative for nausea and vomiting.  Musculoskeletal: Positive for myalgias.  Skin: Positive for rash and wound.  Hematological: Does not bruise/bleed easily.  All other systems reviewed and are negative.    Physical Exam Updated Vital Signs BP 101/70    Pulse 91    Temp 99 F (37.2 C) (Oral)    Resp (!) 25    Ht _0  (1.702 m)    Wt 77.1 kg    SpO2 98%    BMI 26.63 kg/m   Physical Exam Vitals signs and nursing note reviewed.  Constitutional:      Appearance: He is well-developed.  HENT:     Head: Atraumatic.  Neck:     Musculoskeletal: Neck supple.  Cardiovascular:     Rate and Rhythm: Normal rate.     Comments: Dopplerable dorsalis pedis Pulmonary:     Effort: Pulmonary effort is normal.  Musculoskeletal:        General: Tenderness present.     Left lower leg: Edema present.     Comments: Left lower extremity is erythematous and warm to touch everywhere except for the toes and distal foot, which is cold to touch. The distal foot also has some desquamation of the skin along with areas of ecchymosis.  Patient's sensation over the distal foot is reduced compared to the contralateral side  Skin:    General: Skin is warm.  Neurological:      Mental Status: He is alert and oriented to person, place, and time.      ED Treatments / Results  Labs (all labs ordered are listed, but only abnormal results are displayed) Labs Reviewed  COMPREHENSIVE METABOLIC PANEL - Abnormal; Notable for the following components:      Result Value   Glucose, Bld 139 (*)    Calcium 8.5 (*)    Total Protein 6.1 (*)    Albumin 3.1 (*)    All other components within normal limits  LACTIC ACID, PLASMA - Abnormal; Notable for the following components:   Lactic Acid, Venous 3.5 (*)    All other components within normal limits  CBC WITH DIFFERENTIAL/PLATELET - Abnormal; Notable for the following components:   WBC 15.6 (*)    RBC 3.94 (*)    Hemoglobin 11.4 (*)    HCT 36.8 (*)    Neutro Abs 11.6 (*)    Monocytes Absolute 2.0 (*)    Abs Immature Granulocytes 0.09 (*)    All other components within normal limits  URINALYSIS, ROUTINE W REFLEX MICROSCOPIC - Abnormal; Notable for the following components:   Ketones, ur 5 (*)    All other components within normal limits  SEDIMENTATION RATE - Abnormal; Notable for the following components:   Sed Rate 31 (*)    All other components within normal limits  C-REACTIVE PROTEIN - Abnormal; Notable for the following components:   CRP 10.3 (*)    All other components within normal limits  SARS CORONAVIRUS 2 (HOSPITAL ORDER, [facility])  CULTURE, BLOOD (ROUTINE X 2)  CULTURE, BLOOD (ROUTINE X 2)  URINE CULTURE  PROTIME-INR  APTT  LACTIC ACID, PLASMA    EKG EKG Interpretation  Date/Time:  Monday February 15 2019 02:05:00 EDT Ventricular Rate:  110 PR Interval:    QRS Duration: 80 QT Interval:  335 QTC Calculation: 454 R Axis:   -25 Text Interpretation:  Sinus tachycardia Atrial premature complexes Borderline left axis deviation Abnormal R-wave progression, late transition No acute changes No significant  change since last tracing Confirmed by Varney Biles [phone]) on  02/15/2019 3:49:53 AM   Radiology Dg Chest 2 View  Result Date: 02/15/2019 CLINICAL DATA:  78 year old male with fever. Concern for sepsis. EXAM: CHEST - 2 VIEW COMPARISON:  Chest radiograph dated 07/15/2017 FINDINGS: Left lung base atelectatic changes. No focal consolidation, pleural effusion, or pneumothorax. Stable mild cardiomegaly. Atherosclerotic calcification of the aortic arch. No acute osseous pathology. IMPRESSION: No active cardiopulmonary disease.  No interval change. Electronically Signed   By: Anner Crete M.D.   On: 02/15/2019 02:02   Dg Foot 2 Views Left  Result Date: 02/15/2019 CLINICAL DATA:  Fever, foot infection EXAM: LEFT FOOT - 2 VIEW COMPARISON:  None. FINDINGS: No fracture or dislocation is seen. The joint spaces are preserved. No evidence of soft tissue gas. Vascular calcifications calcification. IMPRESSION: No radiographic findings to suggest acute osteomyelitis. No evidence of soft tissue gas. Electronically Signed   By: Julian Hy M.D.   On: 02/15/2019 03:02    Procedures .Critical Care Performed by: Varney Biles, MD Authorized by: Varney Biles, MD   Critical care provider statement:    Critical care time (minutes):  32   Critical care was necessary to treat or prevent imminent or life-threatening deterioration of the following conditions:  Sepsis   Critical care was time spent personally by me on the following activities:  Discussions with consultants, evaluation of patient's response to treatment, examination of patient, ordering and performing treatments and interventions, ordering and review of laboratory studies, ordering and review of radiographic studies, pulse oximetry, re-evaluation of patient's condition, obtaining history from patient or surrogate and review of old charts   (including critical care time)  Medications Ordered in ED Medications  enoxaparin (LOVENOX) injection 40 mg (has no administration in time range)  0.9 %  sodium  chloride infusion (has no administration in time range)  acetaminophen (TYLENOL) tablet 650 mg (has no administration in time range)    Or  acetaminophen (TYLENOL) suppository 650 mg (has no administration in time range)  allopurinol (ZYLOPRIM) tablet 100 mg (has no administration in time range)  aspirin EC tablet 81 mg (has no administration in time range)  amLODipine (NORVASC) tablet 2.5 mg (has no administration in time range)  pioglitazone (ACTOS) tablet 15 mg (has no administration in time range)  predniSONE (DELTASONE) tablet 25 mg (has no administration in time range)  mesalamine (PENTASA) CR capsule 2,000 mg (has no administration in time range)  pantoprazole (PROTONIX) EC tablet 40 mg (has no administration in time range)  B-12 TABS 2,500 mcg (has no administration in time range)  ferrous sulfate tablet 325 mg (has no administration in time range)  Vitamin D 2,000 Units (has no administration in time range)  multivitamin with minerals tablet 1 tablet (has no administration in time range)  albuterol (VENTOLIN HFA) 108 (90 Base) MCG/ACT inhaler 2 puff (has no administration in time range)  benzonatate (TESSALON) capsule 100 mg (has no administration in time range)  albuterol (VENTOLIN HFA) 108 (90 Base) MCG/ACT inhaler 2 puff (has no administration in time range)  brimonidine-timolol (COMBIGAN) 0.2-0.5 % ophthalmic solution 1 drop (has no administration in time range)  latanoprost (XALATAN) 0.005 % ophthalmic solution 1 drop (has no administration in time range)  piperacillin-tazobactam (ZOSYN) IVPB 3.375 g (has no administration in time range)  vancomycin (VANCOCIN) 1,500 mg in sodium chloride 0.9 % 500 mL IVPB (has no administration in time range)  lactated ringers bolus 1,000 mL (0 mLs Intravenous Stopped 02/15/19  2904)  ceFEPIme (MAXIPIME) 2 g in sodium chloride 0.9 % 100 mL IVPB (0 g Intravenous Stopped 02/15/19 0322)  metroNIDAZOLE (FLAGYL) IVPB 500 mg (0 mg Intravenous Stopped  02/15/19 0327)  vancomycin (VANCOCIN) 1,750 mg in sodium chloride 0.9 % 500 mL IVPB (1,750 mg Intravenous New Bag/Given 02/15/19 0222)     Initial Impression / Assessment and Plan / ED Course  I have reviewed the triage vital signs and the nursing notes.  Pertinent labs & imaging results that were available during my care of the patient were reviewed by me and considered in my medical decision making (see chart for details).        78 year old comes in a chief complaint of lower extremity pain.  He is noted to have cold and discolored distal left foot and toes.  Fortunately does have a dopplerable pulse in the left foot.  The left lower extremity is erythematous and warm to touch.   Patient has history of peripheral arterial disease and has required amputation of the past.  He also has diabetes.  Clinical suspicion is high for cellulitis.  He has had DVT, PE in the past, but he is got high-grade fever and elevated white count, therefore clinically he is more likely to have cellulitis and DVT at this time.  Additionally we will get sed rate and CRP, if significantly elevated patient will need osteomyelitis rule out.  Patient has 4 out of 4 Sirs criteria with elevated lactic acid.  IV fluid initiated.  Broad-spectrum antibiotics provided.  X-ray does not show any evidence of gas which is reassuring.  Stable for admission.  Reassessment:  Vitals:   02/15/19 0400 02/15/19 0444  BP: 101/70   Pulse:    Resp: (!) 25   Temp:  99 F (37.2 C)  SpO2:     Repeat lactate is pending.   Final Clinical Impressions(s) / ED Diagnoses   Final diagnoses:  Cellulitis of left lower extremity  Severe sepsis [facility])    ED Discharge Orders    None       Varney Biles, MD 02/15/19 [phone]

## 2019-02-16 DIAGNOSIS — E1165 Type 2 diabetes mellitus with hyperglycemia: Secondary | ICD-10-CM

## 2019-02-16 LAB — LACTIC ACID, PLASMA
Lactic Acid, Venous: 2.8 mmol/L (ref 0.5–1.9)
Lactic Acid, Venous: 4.9 mmol/L (ref 0.5–1.9)

## 2019-02-16 LAB — COMPREHENSIVE METABOLIC PANEL
ALT: 23 U/L (ref 0–44)
AST: 23 U/L (ref 15–41)
Albumin: 2.5 g/dL — ABNORMAL LOW (ref 3.5–5.0)
Alkaline Phosphatase: 35 U/L — ABNORMAL LOW (ref 38–126)
Anion gap: 13 (ref 5–15)
BUN: 9 mg/dL (ref 8–23)
CO2: 24 mmol/L (ref 22–32)
Calcium: 8.3 mg/dL — ABNORMAL LOW (ref 8.9–10.3)
Chloride: 104 mmol/L (ref 98–111)
Creatinine, Ser: 0.99 mg/dL (ref 0.61–1.24)
GFR calc Af Amer: >60 mL/min (ref >60)
GFR calc non Af Amer: >60 mL/min (ref >60)
Glucose, Bld: 129 mg/dL — ABNORMAL HIGH (ref 70–99)
Potassium: 3.4 mmol/L — ABNORMAL LOW (ref 3.5–5.1)
Sodium: 141 mmol/L (ref 135–145)
Total Bilirubin: 0.7 mg/dL (ref 0.3–1.2)
Total Protein: 5 g/dL — ABNORMAL LOW (ref 6.5–8.1)

## 2019-02-16 LAB — CBC
HCT: 34 % — ABNORMAL LOW (ref 39.0–52.0)
Hemoglobin: 10.5 g/dL — ABNORMAL LOW (ref 13.0–17.0)
MCH: 28.5 pg (ref 26.0–34.0)
MCHC: 30.9 g/dL (ref 30.0–36.0)
MCV: 92.1 fL (ref 80.0–100.0)
Platelets: 216 10*3/uL (ref 150–400)
RBC: 3.69 MIL/uL — ABNORMAL LOW (ref 4.22–5.81)
RDW: 13.5 % (ref 11.5–15.5)
WBC: 9.7 10*3/uL (ref 4.0–10.5)
nRBC: 0 % (ref 0.0–0.2)

## 2019-02-16 LAB — URINE CULTURE

## 2019-02-16 MED ORDER — POTASSIUM CHLORIDE CRYS ER 20 MEQ PO TBCR
40.0000 meq | EXTENDED_RELEASE_TABLET | Freq: Once | ORAL | Status: AC
  Administered 2019-02-16: 40 meq via ORAL
  Filled 2019-02-16: qty 2

## 2019-02-16 MED ORDER — SODIUM CHLORIDE 0.9 % IV BOLUS
500.0000 mL | Freq: Once | INTRAVENOUS | Status: AC
  Administered 2019-02-16: 500 mL via INTRAVENOUS

## 2019-02-16 NOTE — Progress Notes (Signed)
PROGRESS NOTE    Rodney Sullivan  ZYY:[id number] DOB: 1940-12-23 DOA: 02/15/2019 PCP: System, Provider Not In    Brief Narrative:  Rodney Sullivan is a 78 year-old Caucasian male with past medical history remarkable for Crohn's disease, PE, DVT, peripheral arterial disease with right middle finger ulceration right second toe ulceration presented with left foot pain and concern for left foot infection.  EDP exam left lower extremity erythema, left foot cold with discolored toes.  Dopplerable pulse.  Admitted for sepsis secondary to left foot cellulitis complicated by known peripheral arterial disease  Assessment & Plan:   Principal Problem:   Sepsis ([place]) Active Problems:   Crohn disease ([place])   Cellulitis and abscess of toe of left foot   Peripheral arterial disease ([place])   DM (diabetes mellitus), type 2 with peripheral vascular complications ([place])   Gout   Sepsis secondary to left foot cellulitis complicated by peripheral arterial disease, POA Patient presenting with left foot pain with surrounding erythema and fever.  Temperature on presentation 102.8 with a WBC count of 15.6 and lactic acidosis of 3.0.  MRI left foot with no evidence of osteomyelitis or septic joint, nonspecific forefoot soft tissue edema likely secondary to cellulitis. --Blood cultures x2 02/15/2019: No growth x1 day --TMAX 99.6 past 24h --WBC 15.6-->9.7 --Vascular surgery following, plan abdominal aortogram with lower extremity runoff on 8/12, Dr. Carlis Abbott --Continue antibiotics with vancomycin and Zosyn --May require BKA versus TMA; further per vascular surgery  Peripheral arterial disease  Follows with [facility], Dr. Donzetta Matters; last seen 7/24.  History of amputation right finger.  Known second toe ulceration.  Amputation of second toe could be considered in the future but remains high risk for proximal limb loss right lower extremity.   --continue wound care, NS soaks right middle finder BID x 54mn --further per  [facility]  Diabetes mellitus type 2 Home regimen includes pioglitazone 191mPO daily.  --hold oral hypoglycemics while inpatient --Check hemoglobin A1c --Continue NovoLog insulin sliding scale for coverage  Crohn's disease --Continue daily prednisone 2516mO daily, mesalamine 2000m72m BID  Essential hypertension --Hold amlodipine in setting of sepsis and borderline blood pressures  Gout --Continue allopurinol  Glaucoma --Continue eyedrops   DVT prophylaxis: SCD right lower extremity Code Status: Full code Family Communication: Discussed plan of care with spouse at bedside Disposition Plan: Continue inpatient, pending aortogram by vascular surgery tomorrow, IV antibiotics, further depending on clinical course   Consultants:   Vascular surgery - FielZerita Boersocedures:  none  Antimicrobials:   Zosyn>>  Vancomycin>>   Subjective: Patient seen and examined at bedside, spouse present.  In good spirits this morning.  Pain controlled.  Continues on IV antibiotics.  Awaiting aortogram planned by vascular surgery tomorrow.  No other complaints or concerns at this time.  Denies headache, no fever/chills/night sweats, no nausea vomiting/diarrhea, no chest pain, no palpitations, no shortness of breath, no abdominal pain.  No acute events overnight per nursing staff.  Objective: Vitals:   02/15/19 2033 02/16/19 0455 02/16/19 0806 02/16/19 0922  BP: (!) 144/72 138/70  123/71  Pulse: 92 89  86  Resp: 18 18  18   Temp: 97.9 F (36.6 C) 98 F (36.7 C)  98 F (36.7 C)  TempSrc: Oral Oral  Oral  SpO2: 95% 98% 99% 98%  Weight:      Height:        Intake/Output Summary (Last 24 hours) at 02/16/2019 1327 Last data filed at 02/16/2019 0601 Gross per 24 hour  Intake 668.57 ml  Output 300 ml  Net 368.57 ml   Filed Weights   02/15/19 0058  Weight: 77.1 kg    Examination:  General exam: Appears calm and comfortable  Respiratory system: Clear to auscultation.  Respiratory effort normal. Cardiovascular system: S1 & S2 heard, RRR. No JVD, murmurs, rubs, gallops or clicks. No pedal edema. Gastrointestinal system: Abdomen is nondistended, soft and nontender. No organomegaly or masses felt. Normal bowel sounds heard. Central nervous system: Alert and oriented. No focal neurological deficits. Extremities: Symmetric 5 x 5 power. Skin:  Left Lower Extremity: erythema noted to dorsum left foot extending cephalad to just below knee, multiple toe ulcerations, foot cool to touch. Dry ulcer left heel. NTTP. Right Lower Extremity: Right foot with dry ulcer third toe. NTTP. Psychiatry: Judgement and insight appear normal. Mood & affect appropriate.                Data Reviewed: I have personally reviewed following labs and imaging studies  CBC: Recent Labs  Lab 02/15/19 0110 02/16/19 0535  WBC 15.6* 9.7  NEUTROABS 11.6*  --   HGB 11.4* 10.5*  HCT 36.8* 34.0*  MCV 93.4 92.1  PLT 243 270   Basic Metabolic Panel: Recent Labs  Lab 02/15/19 0110 02/16/19 0535  NA 138 141  K 3.5 3.4*  CL 103 104  CO2 23 24  GLUCOSE 139* 129*  BUN 17 9  CREATININE 0.95 0.99  CALCIUM 8.5* 8.3*   GFR: Estimated Creatinine Clearance: 59.3 mL/min (by C-G formula based on SCr of 0.99 mg/dL). Liver Function Tests: Recent Labs  Lab 02/15/19 0110 02/16/19 0535  AST 35 23  ALT 26 23  ALKPHOS 43 35*  BILITOT 0.7 0.7  PROT 6.1* 5.0*  ALBUMIN 3.1* 2.5*   No results for input(s): LIPASE, AMYLASE in the last 168 hours. No results for input(s): AMMONIA in the last 168 hours. Coagulation Profile: Recent Labs  Lab 02/15/19 0110  INR 1.0   Cardiac Enzymes: No results for input(s): CKTOTAL, CKMB, CKMBINDEX, TROPONINI in the last 168 hours. BNP (last 3 results) No results for input(s): PROBNP in the last 8760 hours. HbA1C: No results for input(s): HGBA1C in the last 72 hours. CBG: No results for input(s): GLUCAP in the last 168 hours. Lipid  Profile: No results for input(s): CHOL, HDL, LDLCALC, TRIG, CHOLHDL, LDLDIRECT in the last 72 hours. Thyroid Function Tests: No results for input(s): TSH, T4TOTAL, FREET4, T3FREE, THYROIDAB in the last 72 hours. Anemia Panel: No results for input(s): VITAMINB12, FOLATE, FERRITIN, TIBC, IRON, RETICCTPCT in the last 72 hours. Sepsis Labs: Recent Labs  Lab 02/15/19 0110 02/15/19 0415 02/16/19 0535 02/16/19 0908  LATICACIDVEN 3.5* 3.0* 2.8* 4.9*    Recent Results (from the past 240 hour(s))  Culture, blood (Routine x 2)     Status: None (Preliminary result)   Collection Time: 02/15/19  1:10 AM   Specimen: BLOOD  Result Value Ref Range Status   Specimen Description   Final    BLOOD RIGHT ANTECUBITAL Performed at [facility], [place] [address], [place], [place] [address]    Special Requests   Final    BOTTLES DRAWN AEROBIC AND ANAEROBIC Blood Culture results may not be optimal due to an excessive volume of blood received in culture bottles Performed at [place] [address] [place], [place] [address]    Culture   Final    NO GROWTH 1 DAY Performed at [facility], [place]  [address] [place], [place] [address]    Report Status PENDING  Incomplete  Culture, blood (Routine x 2)     Status: None (Preliminary result)   Collection Time: 02/15/19  1:11 AM   Specimen: BLOOD LEFT HAND  Result Value Ref Range Status   Specimen Description   Final    BLOOD LEFT HAND Performed at [place] [address] [place], [place] [address]    Special Requests   Final    BOTTLES DRAWN AEROBIC AND ANAEROBIC Blood Culture results may not be optimal due to an excessive volume of blood received in culture bottles Performed at [place] [address], [place], [place] [address]    Culture   Final    NO GROWTH 1 DAY Performed at [facility], [place] [address], [place], [place] [address]    Report Status  PENDING  Incomplete  Urine culture     Status: Abnormal   Collection Time: 02/15/19  1:51 AM   Specimen: In/Out Cath Urine  Result Value Ref Range Status   Specimen Description   Final    IN/OUT CATH URINE Performed at [place] [address], [place], [place] [address]    Special Requests   Final    NONE Performed at [facility], [place] [address] [place], [place] [address]    Culture MULTIPLE SPECIES PRESENT, SUGGEST RECOLLECTION (A)  Final   Report Status 02/16/2019 FINAL  Final  SARS Coronavirus 2 [facility] order, Performed in [facility] hospital lab) Nasopharyngeal Nasopharyngeal Swab     Status: None   Collection Time: 02/15/19  2:52 AM   Specimen: Nasopharyngeal Swab  Result Value Ref Range Status   SARS Coronavirus 2 NEGATIVE NEGATIVE Final    Comment: (NOTE) If result is NEGATIVE SARS-CoV-2 target nucleic acids are NOT DETECTED. The SARS-CoV-2 RNA is generally detectable in upper and lower  respiratory specimens during the acute phase of infection. The lowest  concentration of SARS-CoV-2 viral copies this assay can detect is 250  copies / mL. A negative result does not preclude SARS-CoV-2 infection  and should not be used as the sole basis for treatment or other  patient management decisions.  A negative result may occur with  improper specimen collection / handling, submission of specimen other  than nasopharyngeal swab, presence of viral mutation(s) within the  areas targeted by this assay, and inadequate number of viral copies  (<250 copies / mL). A negative result must be combined with clinical  observations, patient history, and epidemiological information. If result is POSITIVE SARS-CoV-2 target nucleic acids are DETECTED. The SARS-CoV-2 RNA is generally detectable in upper and lower  respiratory specimens dur ing the acute phase of infection.  Positive  results are indicative of active infection with SARS-CoV-2.   Clinical  correlation with patient history and other diagnostic information is  necessary to determine patient infection status.  Positive results do  not rule out bacterial infection or co-infection with other viruses. If result is PRESUMPTIVE POSTIVE SARS-CoV-2 nucleic acids MAY BE PRESENT.   A presumptive positive result was obtained on the submitted specimen  and confirmed on repeat testing.  While 2019 novel coronavirus  (SARS-CoV-2) nucleic acids may be present in the submitted sample  additional confirmatory testing may be necessary for epidemiological  and / or clinical management purposes  to differentiate between  SARS-CoV-2 and other Sarbecovirus currently known to infect humans.  If clinically indicated additional testing with an  alternate test  methodology [phone]) is advised. The SARS-CoV-2 RNA is generally  detectable in upper and lower respiratory sp ecimens during the acute  phase of infection. The expected result is Negative. Fact Sheet for Patients:  StrictlyIdeas.no Fact Sheet for Healthcare Providers: BankingDealers.co.za This test is not yet approved or cleared by the [place] FDA and has been authorized for detection and/or diagnosis of SARS-CoV-2 by FDA under an Emergency Use Authorization (EUA).  This EUA will remain in effect (meaning this test can be used) for the duration of the COVID-19 declaration under Section 564(b)(1) of the Act, 21 U.S.C. section 360bbb-3(b)(1), unless the authorization is terminated or revoked sooner. Performed at [facility], [place] [address], [place], [place] [address]          Radiology Studies: Dg Chest 2 View  Result Date: 02/15/2019 CLINICAL DATA:  78 year old male with fever. Concern for sepsis. EXAM: CHEST - 2 VIEW COMPARISON:  Chest radiograph dated 07/15/2017 FINDINGS: Left lung base atelectatic changes. No focal consolidation, pleural effusion,  or pneumothorax. Stable mild cardiomegaly. Atherosclerotic calcification of the aortic arch. No acute osseous pathology. IMPRESSION: No active cardiopulmonary disease.  No interval change. Electronically Signed   By: Anner Crete M.D.   On: 02/15/2019 02:02   Mr Foot Left W Wo Contrast  Result Date: 02/15/2019 CLINICAL DATA:  Left foot erythema and swelling for 5 days. History of diabetes. Concern for osteomyelitis. EXAM: MRI OF THE LEFT FOREFOOT WITHOUT AND WITH CONTRAST TECHNIQUE: Multiplanar, multisequence MR imaging of the left forefoot was performed both before and after administration of intravenous contrast. CONTRAST:  80 cc Gadavist COMPARISON:  Left foot radiographs earlier today. FINDINGS: Despite efforts by the technologist and patient, moderate motion artifact is present on today's exam and could not be eliminated. This reduces exam sensitivity and specificity. Bones/Joint/Cartilage No evidence of acute fracture, dislocation or bone destruction. There is no suspicious marrow enhancement or significant joint effusion. There are mild degenerative changes at the Lisfranc joint without subluxation. There are minimal degenerative changes at the 1st metatarsophalangeal joint. Ligaments The Lisfranc ligament and the collateral ligaments of the metatarsophalangeal joints are intact. Muscles and Tendons No focal muscular abnormality or abnormal enhancement identified. There is mild probable diabetic myopathy in the forefoot. No significant tenosynovitis. Soft tissues Mild nonspecific forefoot subcutaneous edema, greatest dorsally. No focal fluid collection, obvious skin ulceration or suspicious soft tissue enhancement. IMPRESSION: 1. The study is moderately motion degraded. 2. No evidence of osteomyelitis or septic joint. 3. Nonspecific forefoot soft tissue edema, possibly cellulitis. No focal fluid collection identified. 4. Mild midfoot degenerative changes. Electronically Signed   By: Richardean Sale  M.D.   On: 02/15/2019 08:14   Dg Foot 2 Views Left  Result Date: 02/15/2019 CLINICAL DATA:  Fever, foot infection EXAM: LEFT FOOT - 2 VIEW COMPARISON:  None. FINDINGS: No fracture or dislocation is seen. The joint spaces are preserved. No evidence of soft tissue gas. Vascular calcifications calcification. IMPRESSION: No radiographic findings to suggest acute osteomyelitis. No evidence of soft tissue gas. Electronically Signed   By: Julian Hy M.D.   On: 02/15/2019 03:02        Scheduled Meds:  allopurinol  100 mg Oral Daily   amLODipine  2.5 mg Oral Q supper   aspirin EC  81 mg Oral Q supper   brimonidine  1 drop Both Eyes Q12H   And   timolol  1 drop Both Eyes Q12H   cholecalciferol  2,000 Units Oral  Daily   ferrous sulfate  325 mg Oral Q breakfast   latanoprost  1 drop Both Eyes QHS   mesalamine  2,000 mg Oral BID   multivitamin with minerals  1 tablet Oral Daily   pantoprazole  40 mg Oral Q1500   pioglitazone  15 mg Oral Q breakfast   predniSONE  25 mg Oral QPC breakfast   vitamin B-12  2,500 mcg Oral Daily   Continuous Infusions:  piperacillin-tazobactam (ZOSYN)  IV 3.375 g (02/16/19 1013)   vancomycin 1,500 mg (02/16/19 0516)     LOS: 1 day    Time spent: 38 minutes spent on chart review, personally reviewing all imaging studies, labs, discussion with nursing staff, consultants, updating family and interview/physical exam; more than 50% of that time was spent in counseling and/or coordination of care.    Shakea Isip J [place] DO [facility] Pager [phone]  If 7PM-7AM, please contact night-coverage [email or web address] Password [facility] 02/16/2019, 1:27 PM

## 2019-02-16 NOTE — Progress Notes (Signed)
Results for STONY, STEGMANN (MRN [id number]) as of 02/16/2019 11:00  Ref. Range 02/16/2019 09:08  Lactic Acid, Venous Latest Ref Range: 0.5 - 1.9 mmol/L 4.9 (HH)   Dr. [place] notified via Shea Evans.

## 2019-02-16 NOTE — Progress Notes (Signed)
Pt wife updated on condition and arteriogram tomorrow   Ruta Hinds, MD [facility] Office: [phone] Pager: [phone]

## 2019-02-16 NOTE — Plan of Care (Signed)
  Problem: Pain Managment: Goal: General experience of comfort will improve Outcome: Progressing   

## 2019-02-17 ENCOUNTER — Encounter (HOSPITAL_COMMUNITY)
Admission: EM | Disposition: A | Discharge status: Home / Self Care | Payer: Self-pay | Source: Home / Self Care | Attending: Internal Medicine

## 2019-02-17 DIAGNOSIS — I998 Other disorder of circulatory system: Secondary | ICD-10-CM

## 2019-02-17 HISTORY — PX: ABDOMINAL AORTOGRAM W/LOWER EXTREMITY: CATH118223

## 2019-02-17 HISTORY — PX: PERIPHERAL VASCULAR ATHERECTOMY: CATH118256

## 2019-02-17 LAB — GLUCOSE, CAPILLARY: Glucose-Capillary: 178 mg/dL — ABNORMAL HIGH (ref 70–99)

## 2019-02-17 LAB — BASIC METABOLIC PANEL
Anion gap: 12 (ref 5–15)
BUN: 5 mg/dL — ABNORMAL LOW (ref 8–23)
CO2: 24 mmol/L (ref 22–32)
Calcium: 8.6 mg/dL — ABNORMAL LOW (ref 8.9–10.3)
Chloride: 105 mmol/L (ref 98–111)
Creatinine, Ser: 1.05 mg/dL (ref 0.61–1.24)
GFR calc Af Amer: >60 mL/min (ref >60)
GFR calc non Af Amer: >60 mL/min (ref >60)
Glucose, Bld: 132 mg/dL — ABNORMAL HIGH (ref 70–99)
Potassium: 3.6 mmol/L (ref 3.5–5.1)
Sodium: 141 mmol/L (ref 135–145)

## 2019-02-17 LAB — CBC
HCT: 36.1 % — ABNORMAL LOW (ref 39.0–52.0)
Hemoglobin: 11.1 g/dL — ABNORMAL LOW (ref 13.0–17.0)
MCH: 28.8 pg (ref 26.0–34.0)
MCHC: 30.7 g/dL (ref 30.0–36.0)
MCV: 93.5 fL (ref 80.0–100.0)
Platelets: 260 10*3/uL (ref 150–400)
RBC: 3.86 MIL/uL — ABNORMAL LOW (ref 4.22–5.81)
RDW: 13.5 % (ref 11.5–15.5)
WBC: 12.4 10*3/uL — ABNORMAL HIGH (ref 4.0–10.5)
nRBC: 0 % (ref 0.0–0.2)

## 2019-02-17 LAB — HEMOGLOBIN A1C
Hgb A1c MFr Bld: 6.5 % — ABNORMAL HIGH (ref 4.8–5.6)
Mean Plasma Glucose: 139.85 mg/dL

## 2019-02-17 LAB — POCT ACTIVATED CLOTTING TIME
Activated Clotting Time: 224 seconds
Activated Clotting Time: 230 seconds

## 2019-02-17 LAB — MAGNESIUM: Magnesium: 1.7 mg/dL (ref 1.7–2.4)

## 2019-02-17 LAB — LACTIC ACID, PLASMA: Lactic Acid, Venous: 3.2 mmol/L (ref 0.5–1.9)

## 2019-02-17 SURGERY — ABDOMINAL AORTOGRAM W/LOWER EXTREMITY
Anesthesia: LOCAL | Laterality: Bilateral

## 2019-02-17 MED ORDER — VERAPAMIL HCL 2.5 MG/ML IV SOLN
INTRAVENOUS | Status: DC | PRN
  Administered 2019-02-17 (×2): 200 ug via INTRA_ARTERIAL

## 2019-02-17 MED ORDER — HEPARIN SODIUM (PORCINE) 1000 UNIT/ML IJ SOLN
INTRAMUSCULAR | Status: AC
  Filled 2019-02-17: qty 1

## 2019-02-17 MED ORDER — CLOPIDOGREL BISULFATE 300 MG PO TABS
ORAL_TABLET | ORAL | Status: DC | PRN
  Administered 2019-02-17: 300 mg via ORAL

## 2019-02-17 MED ORDER — HEPARIN (PORCINE) IN NACL 1000-0.9 UT/500ML-% IV SOLN
INTRAVENOUS | Status: AC
  Filled 2019-02-17: qty 1000

## 2019-02-17 MED ORDER — VIPERSLIDE LUBRICANT OPTIME
TOPICAL | Status: DC | PRN
  Administered 2019-02-17: 18:00:00 20 mL via SURGICAL_CAVITY

## 2019-02-17 MED ORDER — SODIUM CHLORIDE 0.9% FLUSH
3.0000 mL | Freq: Two times a day (BID) | INTRAVENOUS | Status: DC
  Administered 2019-02-18 – 2019-02-20 (×5): 3 mL via INTRAVENOUS

## 2019-02-17 MED ORDER — CLOPIDOGREL BISULFATE 300 MG PO TABS
ORAL_TABLET | ORAL | Status: AC
  Filled 2019-02-17: qty 1

## 2019-02-17 MED ORDER — HEPARIN (PORCINE) IN NACL 1000-0.9 UT/500ML-% IV SOLN
INTRAVENOUS | Status: DC | PRN
  Administered 2019-02-17 (×2): 500 mL

## 2019-02-17 MED ORDER — SODIUM CHLORIDE 0.9% FLUSH: 3.0000 mL | INTRAVENOUS | Status: DC | PRN

## 2019-02-17 MED ORDER — ACETAMINOPHEN 325 MG PO TABS
650.0000 mg | ORAL_TABLET | ORAL | Status: DC | PRN
  Administered 2019-02-17: 650 mg via ORAL
  Administered 2019-02-18: 325 mg via ORAL
  Administered 2019-02-18: 650 mg via ORAL
  Filled 2019-02-17 (×3): qty 2

## 2019-02-17 MED ORDER — VERAPAMIL HCL 2.5 MG/ML IV SOLN
INTRAVENOUS | Status: AC
  Filled 2019-02-17: qty 2

## 2019-02-17 MED ORDER — LIDOCAINE HCL (PF) 1 % IJ SOLN
INTRAMUSCULAR | Status: AC
  Filled 2019-02-17: qty 30

## 2019-02-17 MED ORDER — HEPARIN SODIUM (PORCINE) 1000 UNIT/ML IJ SOLN
INTRAMUSCULAR | Status: DC | PRN
  Administered 2019-02-17: 8000 [IU] via INTRAVENOUS
  Administered 2019-02-17: 2000 [IU] via INTRAVENOUS

## 2019-02-17 MED ORDER — CLOPIDOGREL BISULFATE 75 MG PO TABS
300.0000 mg | ORAL_TABLET | Freq: Once | ORAL | Status: DC
  Filled 2019-02-17 (×2): qty 4

## 2019-02-17 MED ORDER — INSULIN ASPART 100 UNIT/ML ~~LOC~~ SOLN
0.0000 [IU] | Freq: Three times a day (TID) | SUBCUTANEOUS | Status: DC
  Administered 2019-02-18: 5 [IU] via SUBCUTANEOUS
  Administered 2019-02-18: 3 [IU] via SUBCUTANEOUS
  Administered 2019-02-19: 5 [IU] via SUBCUTANEOUS
  Administered 2019-02-19: 3 [IU] via SUBCUTANEOUS

## 2019-02-17 MED ORDER — CLOPIDOGREL BISULFATE 75 MG PO TABS
75.0000 mg | ORAL_TABLET | Freq: Every day | ORAL | Status: DC
  Administered 2019-02-18 – 2019-02-20 (×3): 75 mg via ORAL
  Filled 2019-02-17 (×3): qty 1

## 2019-02-17 MED ORDER — NITROGLYCERIN IN D5W 200-5 MCG/ML-% IV SOLN
INTRAVENOUS | Status: AC
  Filled 2019-02-17: qty 250

## 2019-02-17 MED ORDER — ONDANSETRON HCL 4 MG/2ML IJ SOLN: 4.0000 mg | Freq: Four times a day (QID) | INTRAMUSCULAR | Status: DC | PRN

## 2019-02-17 MED ORDER — NITROGLYCERIN 1 MG/10 ML FOR IR/CATH LAB
INTRA_ARTERIAL | Status: DC | PRN
  Administered 2019-02-17 (×7): 200 ug via INTRA_ARTERIAL

## 2019-02-17 MED ORDER — LABETALOL HCL 5 MG/ML IV SOLN: 10.0000 mg | INTRAVENOUS | Status: DC | PRN

## 2019-02-17 MED ORDER — LIDOCAINE HCL (PF) 1 % IJ SOLN
INTRAMUSCULAR | Status: DC | PRN
  Administered 2019-02-17: 15 mL via INTRADERMAL

## 2019-02-17 MED ORDER — IODIXANOL 320 MG/ML IV SOLN
INTRAVENOUS | Status: DC | PRN
  Administered 2019-02-17: 140 mL via INTRA_ARTERIAL

## 2019-02-17 MED ORDER — SODIUM CHLORIDE 0.9 % IV SOLN: 250.0000 mL | INTRAVENOUS | Status: DC | PRN

## 2019-02-17 MED ORDER — HYDRALAZINE HCL 20 MG/ML IJ SOLN: 5.0000 mg | INTRAMUSCULAR | Status: DC | PRN

## 2019-02-17 MED ORDER — VANCOMYCIN HCL 10 G IV SOLR
1250.0000 mg | INTRAVENOUS | Status: DC
  Administered 2019-02-18: 1250 mg via INTRAVENOUS
  Filled 2019-02-17 (×2): qty 1250

## 2019-02-17 SURGICAL SUPPLY — 23 items
BALLN COYOTE OTW 1.5X40X150 (BALLOONS) ×3
BALLN COYOTE OTW 3X220X150 (BALLOONS) ×3
BALLOON COYOTE OTW 1.5X40X150 (BALLOONS) ×2 IMPLANT
BALLOON COYOTE OTW 3X220X150 (BALLOONS) ×2 IMPLANT
CATH CXI SUPP 2.6F 150 ST (CATHETERS) ×3 IMPLANT
CATH OMNI FLUSH 5F 65CM (CATHETERS) ×3 IMPLANT
CLOSURE MYNX CONTROL 5F (Vascular Products) ×3 IMPLANT
CROWN STEALTH MICRO-30 1.25MM (CATHETERS) ×3 IMPLANT
DEVICE TORQUE .025-.038 (MISCELLANEOUS) ×3 IMPLANT
GUIDEWIRE ANGLED .035X150CM (WIRE) ×3 IMPLANT
KIT ENCORE 26 ADVANTAGE (KITS) ×3 IMPLANT
KIT MICROPUNCTURE NIT STIFF (SHEATH) ×3 IMPLANT
KIT PV (KITS) ×3 IMPLANT
LUBRICANT VIPERSLIDE CORONARY (MISCELLANEOUS) ×3 IMPLANT
SHEATH FLEX ANSEL ANG 5F 45CM (SHEATH) ×3 IMPLANT
SHEATH PINNACLE 5F 10CM (SHEATH) ×3 IMPLANT
SYR MEDRAD MARK V 150ML (SYRINGE) ×3 IMPLANT
TRANSDUCER W/STOPCOCK (MISCELLANEOUS) ×3 IMPLANT
TRAY PV CATH (CUSTOM PROCEDURE TRAY) ×3 IMPLANT
WIRE BENTSON .035X145CM (WIRE) ×3 IMPLANT
WIRE G V18X300CM (WIRE) ×9 IMPLANT
WIRE ROSEN-J .035X180CM (WIRE) ×3 IMPLANT
WIRE VIPER WIRECTO 0.014 (WIRE) ×3 IMPLANT

## 2019-02-17 NOTE — Op Note (Signed)
Patient name: Rodney Sullivan MRN: [id number] DOB: 01-11-1941 Sex: male  02/15/2019 - 02/17/2019 Pre-operative Diagnosis: Critical limb ischemia of the left lower extremity with tissue loss Post-operative diagnosis:  Same Surgeon:  [name], MD Procedure Performed: 1.  Ultrasound-guided access of the right common femoral artery 2.  Left lower extremity arteriogram with selection of third order branches 3.  Left posterior tibial artery mechanical atherectomy (1.25 mm micro CSI atherectomy device) 4.  Left posterior tibial artery angioplasty (3.0 mm x 220 mm Coyote) 5.  Left posterior tibial artery angioplasty at the ankle (1.5 mm x 40 mm Coyote) 6.  Mynx closure of the right common femoral artery  Indications: Patient is a 78 year old male who presents with tissue loss of his left lower extremity.  He was seen by Dr. Oneida Alar in consultation and felt to have likely tibial disease.  He presents today for arteriogram and possible intervention after risks benefits were discussed.  Findings:   Aortogram was deferred since it was recently performed by Dr.Cain.  Left lower extremity arteriogram showed widely patent common femoral, profunda, SFA, above and below-knee popliteal artery.  Patient had pretty brisk flow down the anterior tibial and peroneal arteries.  The posterior tibial was fairly diseased throughout its course with multiple greater than 90% focal stenosis and heavily calcified.  There was no significant filling of the plantar arch in the foot with poor outflow and small vessel disease.  The anterior tibial appeared to occlude just above the ankle with a very diminutive dorsalis pedis full of calcium.  Left lower extremity posterior mechanical atherectomy was performed at low medium and high speeds.  Posterior tibial artery was then angioplastied with a 3 mm balloon throughout its course.  I then had pretty-sluggish flow down the posterior tibial after intervention.  There was  very poor outflow in the foot prior to intervention.  As a result to try and improve outflow went down and angioplastied the posterior tibial artery at the ankle with a 1.5 mm Coyote.  At completion of the case he had very brisk flow down the anterior tibial peroneal which were preserved.  Flow was very sluggish down the posterior tibial.  However, very brisk posterior tibial signal improved.  Hopefully when he warms up whether this is related to spasm or whether potentially he has very poor outflow to support our posterior tibial intervention.   Procedure:  The patient was identified in the holding area and taken to room 8.  The patient was then placed supine on the table and prepped and draped in the usual sterile fashion.  A time out was called.  Ultrasound was used to evaluate the right common femoral artery.  It was patent .  A digital ultrasound image was acquired.  A micropuncture needle was used to access the right common femoral artery under ultrasound guidance.  An 018 wire was advanced without resistance and a micropuncture sheath was placed.  The 018 wire was removed and a benson wire was placed.  The micropuncture sheath was exchanged for a 5 french sheath.  An omniflush catheter was advanced over the wire to the level of L-1.  Next using a Glidewire and the Omni Flush catheter I selected the contralateral common iliac.  Advanced my wire down to the left SFA over which I got a Omni catheter to track.  Left lower extremity runoff was then obtained with pertinent findings above.  Also obtained a lateral foot shot.  After making decision to intervene  on the tibials I placed a Rosen wire and exchanged for a long 5 French sheath in the right groin over the bifurcation.  Patient was given 100 units herparin per kilogram heparin.  I then went down with a V 18 and a CXI catheter and further evaluate the tibials.  We initially debated going down the anterior tibial and treating into the dorsalis pedis of the  foot but the CSI catheter was not long enough.  As a result selected the posterior tibial given that it appeared to be the most heavily diseased with multiple greater than 90% stenosis including at the origin and throughout the artery.  This was then selected with a V 18 and tracked a CXI catheter down to the ankle exchanged for Viper wire.  Nitroglycerin was then given throughout the case.  I selected a 1.25 mm CSI microcatheter and ran this at low medium high speeds down the posterior tibial from origin to ankle.  I then ballooned the entire artery with a 3 mm Coyote.  Injection through the sheath initially showed flow down the peroneal and anterior tibial.  Gave additional nitroglycerin.  I went down and ballooned the artery again.  Contrast was still hanging up in the artery so that I went down and ballooned the posterior tibial at the ankle with a 1.5 mm balloon thinking it was an outflow issue.  No concern for dissection as wire went down very easily.  I reviewed old imaging and the patient and essentially no outflow down into the foot from the posterior tibial.  I suspect this is an outflow issue after intervention but certainly could be related to spasm.  I gave additional nitroglycerin and verapamil into the foot.  One final injection after catheters and wires were performed showed again inline flow down the anterior tibial and peroneal which were preserved and sluggish flow down the posterior tibial.  We will have to see how he does once he warms up and is out of spasm.  Wires and catheters were removed.  A short 5 French sheath was placed in the right groin.  A mynx closure device was deployed.  Very brisk PT signal in left foot, much improved.    [name], MD [facility] [place] Office: [phone] Pager: [place]

## 2019-02-17 NOTE — Progress Notes (Signed)
PROGRESS NOTE    Rodney Sullivan  PQD:[id number] DOB: September 16, 1940 DOA: 02/15/2019 PCP: System, Provider Not In    Brief Narrative:  78 year old male who presented with cellulitis.  He does have significant past medical history for Crohn's disease, history of PE/DVT, peripheral vascular disease.  He reported 7 days of left foot rash.  On his initial physical examination blood pressure 109/70, heart rate 91, temperature 39.3, respiratory rate 18, oxygen saturation 98%.  His lungs had rales at the right base, heart S1-S2 present and rhythmic, abdomen soft and nontender, no lower extremity edema.  Left foot with rash, dark red area on the tip of the first toe.  Patient was admitted to the hospital with a working diagnosis of sepsis due to left foot cellulitis.  Assessment & Plan:   Principal Problem:   Sepsis ([place]) Active Problems:   Crohn disease ([place])   Cellulitis and abscess of toe of left foot   Peripheral arterial disease ([place])   DM (diabetes mellitus), type 2 with peripheral vascular complications (HCC)   Gout   1. Left foot cellulitis, in the setting of peripheral vascular disease. Clinically with improved erythema and edema, continue to have ischemic features to his toes with discoloration, decreased pulse and temperature. Continue antibiotic therapy (Zosyn and Vancomycin), follow with angiography results. Continue pain control.   2. T2DM. Continue glucose cover and monitoring with insulin sliding scale, patient is tolerating po well. Will hold on pioglytazone for now.   3, HTN. Continue blood pressure monitoring. Continue amlodipine.   4. Crohn's disease. No active flare, will continue close monitoring. Continue home dose of prednisone.   5. Gout. Stable with no active flare.    DVT prophylaxis: enoxaparin   Code Status:  full Family Communication: I spoke with patient's wife at the bedside and all questions were addressed.  Disposition Plan/ discharge barriers: pending  angiography results.   Body mass index is 27.11 kg/m. Malnutrition Type:      Malnutrition Characteristics:      Nutrition Interventions:     RN Pressure Injury Documentation:     Consultants:   Vascular surgery   Procedures:     Antimicrobials:       Subjective: Patient continue to have left foot pain, moderate in intensity, no improving or worsening factors, the erythema and edema has improved but not completely resolves. Has been NPO since midnight.   Objective: Vitals:   02/16/19 0922 02/16/19 1802 02/16/19 1952 02/17/19 0438  BP: 123/71 (!) 146/87 (!) 153/78 127/76  Pulse: 86 95 92 79  Resp: 18 18 16 18   Temp: 98 F (36.7 C) 98.3 F (36.8 C) 98.9 F (37.2 C) 98.2 F (36.8 C)  TempSrc: Oral Oral Oral Oral  SpO2: 98% 99% 93% 96%  Weight:    78.5 kg  Height:        Intake/Output Summary (Last 24 hours) at 02/17/2019 0941 Last data filed at 02/17/2019 0831 Gross per 24 hour  Intake 462.8 ml  Output 700 ml  Net -237.2 ml   Filed Weights   02/15/19 0058 02/17/19 0438  Weight: 77.1 kg 78.5 kg    Examination:   General: deconditioned  Neurology: Awake and alert, non focal  E ENT: midl pallor, no icterus, oral mucosa moist Cardiovascular: No JVD. S1-S2 present, rhythmic, no gallops, rubs, or murmurs. No lower extremity edema. Pulmonary: positive breath sounds bilaterally, adequate air movement, no wheezing, rhonchi or rales. Gastrointestinal. Abdomen withno organomegaly, non tender, no rebound or guarding Skin. Left  foot with erythema and edema, positive decoloration of all toes with decrease local temperature and diminished pedal pulse.  Musculoskeletal: no joint deformities/ tip index   Finger right hand amputated.      Data Reviewed: I have personally reviewed following labs and imaging studies  CBC: Recent Labs  Lab 02/15/19 0110 02/16/19 0535 02/17/19 0414  WBC 15.6* 9.7 12.4*  NEUTROABS 11.6*  --   --   HGB 11.4* 10.5* 11.1*   HCT 36.8* 34.0* 36.1*  MCV 93.4 92.1 93.5  PLT 243 216 537   Basic Metabolic Panel: Recent Labs  Lab 02/15/19 0110 02/16/19 0535 02/17/19 0414  NA 138 141 141  K 3.5 3.4* 3.6  CL 103 104 105  CO2 23 24 24   GLUCOSE 139* 129* 132*  BUN 17 9 5*  CREATININE 0.95 0.99 1.05  CALCIUM 8.5* 8.3* 8.6*  MG  --   --  1.7   GFR: Estimated Creatinine Clearance: 56 mL/min (by C-G formula based on SCr of 1.05 mg/dL). Liver Function Tests: Recent Labs  Lab 02/15/19 0110 02/16/19 0535  AST 35 23  ALT 26 23  ALKPHOS 43 35*  BILITOT 0.7 0.7  PROT 6.1* 5.0*  ALBUMIN 3.1* 2.5*   No results for input(s): LIPASE, AMYLASE in the last 168 hours. No results for input(s): AMMONIA in the last 168 hours. Coagulation Profile: Recent Labs  Lab 02/15/19 0110  INR 1.0   Cardiac Enzymes: No results for input(s): CKTOTAL, CKMB, CKMBINDEX, TROPONINI in the last 168 hours. BNP (last 3 results) No results for input(s): PROBNP in the last 8760 hours. HbA1C: Recent Labs    02/17/19 0414  HGBA1C 6.5*   CBG: No results for input(s): GLUCAP in the last 168 hours. Lipid Profile: No results for input(s): CHOL, HDL, LDLCALC, TRIG, CHOLHDL, LDLDIRECT in the last 72 hours. Thyroid Function Tests: No results for input(s): TSH, T4TOTAL, FREET4, T3FREE, THYROIDAB in the last 72 hours. Anemia Panel: No results for input(s): VITAMINB12, FOLATE, FERRITIN, TIBC, IRON, RETICCTPCT in the last 72 hours.    Radiology Studies: I have reviewed all of the imaging during this hospital visit personally     Scheduled Meds: . allopurinol  100 mg Oral Daily  . amLODipine  2.5 mg Oral Q supper  . aspirin EC  81 mg Oral Q supper  . brimonidine  1 drop Both Eyes Q12H   And  . timolol  1 drop Both Eyes Q12H  . cholecalciferol  2,000 Units Oral Daily  . ferrous sulfate  325 mg Oral Q breakfast  . latanoprost  1 drop Both Eyes QHS  . mesalamine  2,000 mg Oral BID  . multivitamin with minerals  1 tablet Oral  Daily  . pantoprazole  40 mg Oral Q1500  . pioglitazone  15 mg Oral Q breakfast  . predniSONE  25 mg Oral QPC breakfast  . vitamin B-12  2,500 mcg Oral Daily   Continuous Infusions: . piperacillin-tazobactam (ZOSYN)  IV 3.375 g (02/17/19 0208)  . [START ON 02/18/2019] vancomycin       LOS: 2 days        Mauricio Gerome Apley, MD
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
## 2019-02-17 NOTE — Progress Notes (Signed)
Patient soaked right middle finger in saline for fifteen minutes per MD order. Patient tolerated well.   Farley Ly RN

## 2019-02-17 NOTE — Plan of Care (Signed)
  Problem: Safety: Goal: Ability to remain free from injury will improve Outcome: Progressing   

## 2019-02-17 NOTE — Progress Notes (Signed)
[facility]     Objective 127/68 76 98.2 F (36.8 C) (Oral) 18 98%  Intake/Output Summary (Last 24 hours) at 02/17/2019 1706 Last data filed at 02/17/2019 1316 Gross per 24 hour  Intake 462.8 ml  Output 700 ml  Net -237.2 ml      Laboratory Lab Results: Recent Labs    02/16/19 0535 02/17/19 0414  WBC 9.7 12.4*  HGB 10.5* 11.1*  HCT 34.0* 36.1*  PLT 216 260   BMET Recent Labs    02/16/19 0535 02/17/19 0414  NA 141 141  K 3.4* 3.6  CL 104 105  CO2 24 24  GLUCOSE 129* 132*  BUN 9 5*  CREATININE 0.99 1.05  CALCIUM 8.3* 8.6*    COAG Lab Results  Component Value Date   INR 1.0 02/15/2019   INR 1.41 12/14/2013   INR 1.15 12/14/2013   No results found for: PTT  Assessment/Planning:  Plan for LLE angiogram and possible intervention for tissue loss.  [name] 02/17/2019 5:06 PM --

## 2019-02-17 NOTE — Progress Notes (Signed)
Pharmacy Antibiotic Note  Rodney Sullivan is a 78 y.o. male admitted on 02/15/2019 with concern for sepsis in the setting of L-foot cellulitis. VVS consulted and planning aortogram with lower extremity runoff today. Pharmacy has been consulted for Vancomycin and Zosyn dosing.  The patient's SCr has bumped up to 1.05 - noted contrast also given on 8/10. Will reduce the dose empirically to help prevent any additional injury and accumulation.   Plan: - Reduce Vancomycin to 1250 mg IV every 24 hours - Continue Zosyn 3.375g IV every 8 hours (infused over 4 hours) - Will continue to follow renal function, culture results, LOT, and antibiotic de-escalation plans   Height: 5' 7"  (170.2 cm) Weight: 173 lb 1.6 oz (78.5 kg) IBW/kg (Calculated) : 66.1  Temp (24hrs), Avg:98.4 F (36.9 C), Min:98 F (36.7 C), Max:98.9 F (37.2 C)  Recent Labs  Lab 02/15/19 0110 02/15/19 0415 02/16/19 0535 02/16/19 0908 02/17/19 0414  WBC 15.6*  --  9.7  --  12.4*  CREATININE 0.95  --  0.99  --  1.05  LATICACIDVEN 3.5* 3.0* 2.8* 4.9* 3.2*    Estimated Creatinine Clearance: 56 mL/min (by C-G formula based on SCr of 1.05 mg/dL).    Allergies  Allergen Reactions  . Ciprofloxacin Other (See Comments)    tendons hurt  . Shrimp [Shellfish Allergy]     Antimicrobials this admission: Cefepime 8/10 x 1 Flagyl 8/10 x 1 Vancomycin 8/10 >> Zosyn 8/10 >>  Microbiology results: 8/10 COVID >> negative 8/10 BCx >> 8/10 UCx >> mult species, needs recollect  Thank you for allowing pharmacy to be a part of this patient's care.  Alycia Rossetti, PharmD, BCPS Clinical Pharmacist Clinical phone for 02/17/2019: [phone] 02/17/2019 7:45 AM   **Pharmacist phone directory can now be found on [email or web address] ([email or web address]).  Listed under [place].

## 2019-02-17 NOTE — Progress Notes (Signed)
Nurse noticed that the cath site at the pt's right groin had increased in bruising and had become harder; it was soft when he arrived at the beginning of shift.  Pt stated he felt slight discomfort, pain level 4, at his groin site but was otherwise asymptomatic. Pulses were dopplerable on the dorsal pedis sites.  Dr. Donnetta Hutching was paged and informed of the changes found.  Early ordered to hold the scheduled Plavix tonight, keep pt on bedrest until morning, and continue to observe the cath groin site.  If the right groin appears to increase in hardness and bruising to hold pressure for 30 minutes and page him again. Will continue to monitor.  Lupita Dawn, RN

## 2019-02-18 ENCOUNTER — Encounter (HOSPITAL_COMMUNITY): Payer: Self-pay | Admitting: Vascular Surgery

## 2019-02-18 LAB — BASIC METABOLIC PANEL
Anion gap: 12 (ref 5–15)
BUN: 5 mg/dL — ABNORMAL LOW (ref 8–23)
CO2: 25 mmol/L (ref 22–32)
Calcium: 8.5 mg/dL — ABNORMAL LOW (ref 8.9–10.3)
Chloride: 106 mmol/L (ref 98–111)
Creatinine, Ser: 0.9 mg/dL (ref 0.61–1.24)
GFR calc Af Amer: >60 mL/min (ref >60)
GFR calc non Af Amer: >60 mL/min (ref >60)
Glucose, Bld: 112 mg/dL — ABNORMAL HIGH (ref 70–99)
Potassium: 3.2 mmol/L — ABNORMAL LOW (ref 3.5–5.1)
Sodium: 143 mmol/L (ref 135–145)

## 2019-02-18 LAB — CBC WITH DIFFERENTIAL/PLATELET
Abs Immature Granulocytes: 0.06 10*3/uL (ref 0.00–0.07)
Basophils Absolute: 0 10*3/uL (ref 0.0–0.1)
Basophils Relative: 0 %
Eosinophils Absolute: 0.2 10*3/uL (ref 0.0–0.5)
Eosinophils Relative: 1 %
HCT: 32.9 % — ABNORMAL LOW (ref 39.0–52.0)
Hemoglobin: 10.4 g/dL — ABNORMAL LOW (ref 13.0–17.0)
Immature Granulocytes: 1 %
Lymphocytes Relative: 19 %
Lymphs Abs: 2.1 10*3/uL (ref 0.7–4.0)
MCH: 28.8 pg (ref 26.0–34.0)
MCHC: 31.6 g/dL (ref 30.0–36.0)
MCV: 91.1 fL (ref 80.0–100.0)
Monocytes Absolute: 1.4 10*3/uL — ABNORMAL HIGH (ref 0.1–1.0)
Monocytes Relative: 13 %
Neutro Abs: 7.3 10*3/uL (ref 1.7–7.7)
Neutrophils Relative %: 66 %
Platelets: 261 10*3/uL (ref 150–400)
RBC: 3.61 MIL/uL — ABNORMAL LOW (ref 4.22–5.81)
RDW: 13.4 % (ref 11.5–15.5)
WBC: 11.1 10*3/uL — ABNORMAL HIGH (ref 4.0–10.5)
nRBC: 0.3 % — ABNORMAL HIGH (ref 0.0–0.2)

## 2019-02-18 LAB — GLUCOSE, CAPILLARY
Glucose-Capillary: 119 mg/dL — ABNORMAL HIGH (ref 70–99)
Glucose-Capillary: 275 mg/dL — ABNORMAL HIGH (ref 70–99)
Glucose-Capillary: 206 mg/dL — ABNORMAL HIGH (ref 70–99)
Glucose-Capillary: 210 mg/dL — ABNORMAL HIGH (ref 70–99)

## 2019-02-18 MED ORDER — SODIUM CHLORIDE 0.9 % IV SOLN
2.0000 g | INTRAVENOUS | Status: DC
  Administered 2019-02-18 – 2019-02-19 (×2): 2 g via INTRAVENOUS
  Filled 2019-02-18: qty 2
  Filled 2019-02-18: qty 20
  Filled 2019-02-18: qty 2

## 2019-02-18 MED ORDER — CEFTRIAXONE SODIUM IN DEXTROSE 40 MG/ML IV SOLN
2.0000 g | INTRAVENOUS | Status: DC
  Filled 2019-02-18: qty 50

## 2019-02-18 MED ORDER — TRAMADOL HCL 50 MG PO TABS
50.0000 mg | ORAL_TABLET | Freq: Four times a day (QID) | ORAL | Status: DC | PRN
  Administered 2019-02-18 (×2): 50 mg via ORAL
  Filled 2019-02-18 (×2): qty 1

## 2019-02-18 MED ORDER — ATORVASTATIN CALCIUM 10 MG PO TABS
10.0000 mg | ORAL_TABLET | Freq: Every day | ORAL | Status: DC
  Administered 2019-02-18 – 2019-02-19 (×2): 10 mg via ORAL
  Filled 2019-02-18 (×2): qty 1

## 2019-02-18 MED ORDER — MAGNESIUM SULFATE 2 GM/50ML IV SOLN
2.0000 g | Freq: Once | INTRAVENOUS | Status: AC
  Administered 2019-02-18: 2 g via INTRAVENOUS
  Filled 2019-02-18: qty 50

## 2019-02-18 MED ORDER — POTASSIUM CHLORIDE CRYS ER 20 MEQ PO TBCR
40.0000 meq | EXTENDED_RELEASE_TABLET | ORAL | Status: AC
  Administered 2019-02-18 (×2): 40 meq via ORAL
  Filled 2019-02-18 (×2): qty 2

## 2019-02-18 NOTE — Progress Notes (Addendum)
[facility] [place]  HPI  - left foot with ischemic changes without history of claudication.  He has a chronic ulcer on his right foot which is slowly healing.  He is on aspirin.   Objective (!) 153/83 88 99 F (37.2 C) (Oral) 19 96%  Intake/Output Summary (Last 24 hours) at 02/18/2019 0730 Last data filed at 02/18/2019 1655 Gross per 24 hour  Intake 581.23 ml  Output 1050 ml  Net -468.77 ml    Left forefoot dry gangrene Palpable PT left LE Right groin +ecchymosis, soft without expanding hematoma Right 3erd toe ulcer   Assessment/Planning: POD # 1 Procedure Performed: 1.  Ultrasound-guided access of the right common femoral artery 2.  Left lower extremity arteriogram with selection of third order branches 3.  Left posterior tibial artery mechanical atherectomy (1.25 mm micro CSI atherectomy device) 4.  Left posterior tibial artery angioplasty (3.0 mm x 220 mm [place]) 5.  Left posterior tibial artery angioplasty at the ankle (1.5 mm x 40 mm [place]) 6.  Mynx closure of the right common femoral artery  Patent in flow with palpable PT We will watch the left forefoot and allow it to demarcate over time.    He received a loading dose of Plavix 300 and will continue Plavix 75 daily He is currently not on a Statin and should be started on one.  I will order Lipitor today to be started.  [name] 02/18/2019 7:30 AM --  Left foot all 5 toes dry gangrene.  Will probably also progress some to forefoot.  His arterial circulation is as good as it will get at this point.  Will allow to demarcate a few weeks then proceed with TMA or BKA  Dry gauze between toes left foot and kerlix  Ruta Hinds, MD [facility] [place]: [phone] Pager: [phone]  Laboratory Lab Results: Recent Labs    02/17/19 0414 02/18/19 0412  WBC 12.4* 11.1*  HGB 11.1* 10.4*  HCT 36.1* 32.9*  PLT 260 261   BMET Recent Labs     02/17/19 0414 02/18/19 0412  NA 141 143  K 3.6 3.2*  CL 105 106  CO2 24 25  GLUCOSE 132* 112*  BUN 5* 5*  CREATININE 1.05 0.90  CALCIUM 8.6* 8.5*    COAG Lab Results  Component Value Date   INR 1.0 02/15/2019   INR 1.41 12/14/2013   INR 1.15 12/14/2013   No results found for: PTT

## 2019-02-18 NOTE — Progress Notes (Signed)
PROGRESS NOTE    Rodney Sullivan  OTL:[id number] DOB: Dec 04, 1940 DOA: 02/15/2019 PCP: System, Provider Not In    Brief Narrative:  78 year old male who presented with cellulitis.  He does have significant past medical history for Crohn's disease, history of PE/DVT, peripheral vascular disease.  He reported 7 days of left foot rash.  On his initial physical examination blood pressure 109/70, heart rate 91, temperature 39.3, respiratory rate 18, oxygen saturation 98%.  His lungs had rales at the right base, heart S1-S2 present and rhythmic, abdomen soft and nontender, no lower extremity edema.  Left foot with rash, dark red area on the tip of the first toe.  Patient was admitted to the hospital with a working diagnosis of sepsis due to left foot cellulitis.   Assessment & Plan:   Principal Problem:   Sepsis ([place]) Active Problems:   Crohn disease ([place])   Cellulitis and abscess of toe of left foot   Peripheral arterial disease ([place])   DM (diabetes mellitus), type 2 with peripheral vascular complications (HCC)   Gout    1. Left foot cellulitis, in the setting of severe peripheral vascular disease, complicated with all 5 toes gangrene.  Continue to have erythema and edema, on his left foot, gangrene toes. SP angiography and atherectomy and angioplasty. Will continue with antibiotic therapy IV ceftriaxone. MRI with no osteomyelitis. Will need surgery on this hospitalization will follow with vascular surgery recommendations.  Added statin therapy, continue with clopidogrel and aspirin.   2. T2DM. On insulin sliding scale, patient is tolerating po well.   3, HTN. On amlodipine for blood pressure control.   4. Crohn's disease. On prednisone and mesalamine per his home regimen.    5. Gout. Stable with no active flare.   6. Hypokalemia and hypomagnesemia. Continue k correction with Kcl, follow on renal panel in am. 2 g Mag sulfate today IV.   7. Iron deficiency anemia. Continue with  iron supplementation.   DVT prophylaxis: enoxaparin   Code Status:  full Family Communication: I spoke with patient's wife at the bedside and all questions were addressed.   Body mass index is 26.66 kg/m. Malnutrition Type:      Malnutrition Characteristics:      Nutrition Interventions:     RN Pressure Injury Documentation:     Consultants:   Vascular surgery   Procedures:   Angioplasty and atherectomy left lower extremity.   Antimicrobials:       Subjective: Patient continue to have bilateral feet edema and gangrene of left toes. No significant pain, no nausea or vomiting. No chest pain or dyspnea.   Objective: Vitals:   02/17/19 2145 02/17/19 2353 02/18/19 0220 02/18/19 0510  BP: (!) 141/76 128/66 132/71 (!) 153/83  Pulse:  (!) 33  88  Resp: (!) 25 19 19 19   Temp:  98.6 F (37 C)  99 F (37.2 C)  TempSrc:  Oral  Oral  SpO2:  95%  96%  Weight:    77.2 kg  Height:        Intake/Output Summary (Last 24 hours) at 02/18/2019 1125 Last data filed at 02/18/2019 9741 Gross per 24 hour  Intake 581.23 ml  Output 850 ml  Net -268.77 ml   Filed Weights   02/17/19 0438 02/17/19 1920 02/18/19 0510  Weight: 78.5 kg 77.1 kg 77.2 kg    Examination:   General: deconditioned  Neurology: Awake and alert, non focal  E ENT: no pallor, no icterus, oral mucosa moist Cardiovascular: No JVD.  S1-S2 present, rhythmic, no gallops, rubs, or murmurs.  Non pitting pedal lower extremity edema. Decreased pedal pulses.  Pulmonary: positive breath sounds bilaterally, adequate air movement, no wheezing, rhonchi or rales. Gastrointestinal. Abdomen  with no organomegaly, non tender, no rebound or guarding Skin. Left foot with mild erythema, positive gangrene of 5 toes on the left. No purulence.  Musculoskeletal: no joint deformities     Data Reviewed: I have personally reviewed following labs and imaging studies  CBC: Recent Labs  Lab 02/15/19 0110 02/16/19 0535  02/17/19 0414 02/18/19 0412  WBC 15.6* 9.7 12.4* 11.1*  NEUTROABS 11.6*  --   --  7.3  HGB 11.4* 10.5* 11.1* 10.4*  HCT 36.8* 34.0* 36.1* 32.9*  MCV 93.4 92.1 93.5 91.1  PLT 243 216 260 737   Basic Metabolic Panel: Recent Labs  Lab 02/15/19 0110 02/16/19 0535 02/17/19 0414 02/18/19 0412  NA 138 141 141 143  K 3.5 3.4* 3.6 3.2*  CL 103 104 105 106  CO2 23 24 24 25   GLUCOSE 139* 129* 132* 112*  BUN 17 9 5* 5*  CREATININE 0.95 0.99 1.05 0.90  CALCIUM 8.5* 8.3* 8.6* 8.5*  MG  --   --  1.7  --    GFR: Estimated Creatinine Clearance: 65.3 mL/min (by C-G formula based on SCr of 0.9 mg/dL). Liver Function Tests: Recent Labs  Lab 02/15/19 0110 02/16/19 0535  AST 35 23  ALT 26 23  ALKPHOS 43 35*  BILITOT 0.7 0.7  PROT 6.1* 5.0*  ALBUMIN 3.1* 2.5*   No results for input(s): LIPASE, AMYLASE in the last 168 hours. No results for input(s): AMMONIA in the last 168 hours. Coagulation Profile: Recent Labs  Lab 02/15/19 0110  INR 1.0   Cardiac Enzymes: No results for input(s): CKTOTAL, CKMB, CKMBINDEX, TROPONINI in the last 168 hours. BNP (last 3 results) No results for input(s): PROBNP in the last 8760 hours. HbA1C: Recent Labs    02/17/19 0414  HGBA1C 6.5*   CBG: Recent Labs  Lab 02/17/19 2129 02/18/19 0604 02/18/19 1120  GLUCAP 178* 119* 275*   Lipid Profile: No results for input(s): CHOL, HDL, LDLCALC, TRIG, CHOLHDL, LDLDIRECT in the last 72 hours. Thyroid Function Tests: No results for input(s): TSH, T4TOTAL, FREET4, T3FREE, THYROIDAB in the last 72 hours. Anemia Panel: No results for input(s): VITAMINB12, FOLATE, FERRITIN, TIBC, IRON, RETICCTPCT in the last 72 hours.    Radiology Studies: I have reviewed all of the imaging during this hospital visit personally     Scheduled Meds: . allopurinol  100 mg Oral Daily  . amLODipine  2.5 mg Oral Q supper  . aspirin EC  81 mg Oral Q supper  . atorvastatin  10 mg Oral q1800  . brimonidine  1 drop Both  Eyes Q12H   And  . timolol  1 drop Both Eyes Q12H  . cholecalciferol  2,000 Units Oral Daily  . clopidogrel  300 mg Oral Once   Followed by  . clopidogrel  75 mg Oral Q breakfast  . ferrous sulfate  325 mg Oral Q breakfast  . insulin aspart  0-9 Units Subcutaneous TID WC  . latanoprost  1 drop Both Eyes QHS  . mesalamine  2,000 mg Oral BID  . multivitamin with minerals  1 tablet Oral Daily  . pantoprazole  40 mg Oral Q1500  . predniSONE  25 mg Oral QPC breakfast  . sodium chloride flush  3 mL Intravenous Q12H  . vitamin B-12  2,500 mcg  Oral Daily   Continuous Infusions: . sodium chloride    . piperacillin-tazobactam (ZOSYN)  IV 12.5 mL/hr at 02/18/19 0625  . vancomycin       LOS: 3 days        Hartford Maulden Gerome Apley, MD

## 2019-02-18 NOTE — Care Management Important Message (Signed)
Important Message  Patient Details  Name: Rodney Sullivan MRN: [id number] Date of Birth: [birth year]   Medicare Important Message Given:  Yes     [name] 02/18/2019, 12:32 PM

## 2019-02-18 NOTE — Progress Notes (Signed)
This RN assumed cared of pt @2345 . Upon assessment, already known hematoma in R groin was observed. Hematoma was slightly larger than golf ball and palpable just above mynx closure dressing. Pulses dopplerable in both lower extremities. Remainder of assessment can be reviewed in ICU Assessment flowsheet.  Manual pressure held to R groin hematoma for 30 minutes from 2355 to 0025. With release of pressure to R groin, no observable hematoma was palpated. R groin very bruised with no visible external bleeding. Will continue to monitor and assess.

## 2019-02-19 LAB — BASIC METABOLIC PANEL
Anion gap: 10 (ref 5–15)
BUN: 7 mg/dL — ABNORMAL LOW (ref 8–23)
CO2: 25 mmol/L (ref 22–32)
Calcium: 8.5 mg/dL — ABNORMAL LOW (ref 8.9–10.3)
Chloride: 108 mmol/L (ref 98–111)
Creatinine, Ser: 0.91 mg/dL (ref 0.61–1.24)
GFR calc Af Amer: >60 mL/min (ref >60)
GFR calc non Af Amer: >60 mL/min (ref >60)
Glucose, Bld: 136 mg/dL — ABNORMAL HIGH (ref 70–99)
Potassium: 4.4 mmol/L (ref 3.5–5.1)
Sodium: 143 mmol/L (ref 135–145)

## 2019-02-19 LAB — GLUCOSE, CAPILLARY
Glucose-Capillary: 121 mg/dL — ABNORMAL HIGH (ref 70–99)
Glucose-Capillary: 228 mg/dL — ABNORMAL HIGH (ref 70–99)
Glucose-Capillary: 270 mg/dL — ABNORMAL HIGH (ref 70–99)
Glucose-Capillary: 198 mg/dL — ABNORMAL HIGH (ref 70–99)

## 2019-02-19 LAB — CBC WITH DIFFERENTIAL/PLATELET
Abs Immature Granulocytes: 0.05 10*3/uL (ref 0.00–0.07)
Basophils Absolute: 0 10*3/uL (ref 0.0–0.1)
Basophils Relative: 0 %
Eosinophils Absolute: 0.2 10*3/uL (ref 0.0–0.5)
Eosinophils Relative: 2 %
HCT: 31.3 % — ABNORMAL LOW (ref 39.0–52.0)
Hemoglobin: 9.6 g/dL — ABNORMAL LOW (ref 13.0–17.0)
Immature Granulocytes: 1 %
Lymphocytes Relative: 25 %
Lymphs Abs: 2.6 10*3/uL (ref 0.7–4.0)
MCH: 28.4 pg (ref 26.0–34.0)
MCHC: 30.7 g/dL (ref 30.0–36.0)
MCV: 92.6 fL (ref 80.0–100.0)
Monocytes Absolute: 1.3 10*3/uL — ABNORMAL HIGH (ref 0.1–1.0)
Monocytes Relative: 12 %
Neutro Abs: 6.3 10*3/uL (ref 1.7–7.7)
Neutrophils Relative %: 60 %
Platelets: 269 10*3/uL (ref 150–400)
RBC: 3.38 MIL/uL — ABNORMAL LOW (ref 4.22–5.81)
RDW: 13.7 % (ref 11.5–15.5)
WBC: 10.5 10*3/uL (ref 4.0–10.5)
nRBC: 0.3 % — ABNORMAL HIGH (ref 0.0–0.2)

## 2019-02-19 NOTE — Progress Notes (Signed)
PROGRESS NOTE    Rodney Sullivan  VEL:[id number] DOB: [birth year] DOA: 02/15/2019 PCP: System, Provider Not In    Brief Narrative:  78 year old male who presented with cellulitis. He does have significant past medical history for Crohn's disease, history of PE/DVT, peripheral vascular disease.He reported 7 days of left foot rash.On his initial physical examination blood pressure 109/70, heart rate 91, temperature 39.3, respiratory rate 18, oxygen saturation 98%. His lungs had rales at the right base, heart S1-S2 present and rhythmic, abdomen soft and nontender, no lower extremity edema. Left foot with rash, dark red area on the tip of the first toe.  Patient was admitted to the hospital with a working diagnosis of sepsis due to left foot cellulitis.   Assessment & Plan:   Principal Problem:   Sepsis ([place]) Active Problems:   Crohn disease ([place])   Cellulitis and abscess of toe of left foot   Peripheral arterial disease ([place])   DM (diabetes mellitus), type 2 with peripheral vascular complications ([facility])   Gout   1. Left foot cellulitis, in the setting of severe peripheral vascular disease, complicated with all 5 toes gangrene.SP angiography, atherectomy and angioplasty (left foot MRI with no osteomyelitis). Improved edema and erythema on his left foot persistent gangrene toes. Tolerating well  IV ceftriaxone. Continue asa, clopidogrel and statin therapy. Will continue to follow with vascular surgery for further surgical interventions.   2. T2DM.  Fasting glucose is 136, patient has been tolerating po diet, contiue with insulin sliding scale, for glucose coverage and monitoring.   3, HTN. Continue with amlodipine with good toleration.   4. Crohn's disease. Continue with prednisone and mesalamine, no active flare.   5. Gout. No active flare.   6. Hypokalemia and hypomagnesemia. Electrolytes have been corrected, K at 4,4 and serum bicarbonate at 25. Preserved renal function.     7. Iron deficiency anemia. On iron supplementation.   DVT prophylaxis:enoxaparin Code Status:full Family Communication:No family at the bedside.   Body mass index is 26.28 kg/m. Malnutrition Type:      Malnutrition Characteristics:      Nutrition Interventions:     RN Pressure Injury Documentation:     Consultants:   Vascular surgery  Procedures:  Left lower extremity angioplasty and atherectomy  Antimicrobials:   IV ceftriaxone    Subjective: Patient is feeling bette but not yet back to his baseline. Left foot with improved rash and edema, continue to have necrotic toes.  Objective: Vitals:   02/18/19 1727 02/18/19 1925 02/19/19 0500 02/19/19 0505  BP: (!) 160/82 (!) 147/87  137/83  Pulse:    75  Resp:  (!) 25  16  Temp:  98.1 F (36.7 C)  97.9 F (36.6 C)  TempSrc:  Oral  Oral  SpO2:  98%  99%  Weight:   76.1 kg   Height:        Intake/Output Summary (Last 24 hours) at 02/19/2019 0839 Last data filed at 02/18/2019 1930 Gross per 24 hour  Intake 1256.35 ml  Output -  Net 1256.35 ml   Filed Weights   02/17/19 1920 02/18/19 0510 02/19/19 0500  Weight: 77.1 kg 77.2 kg 76.1 kg    Examination:   General: not in pain or dyspnea Neurology: Awake and alert, non focal  E ENT: no pallor, no icterus, oral mucosa moist Cardiovascular: No JVD. S1-S2 present, rhythmic, no gallops, rubs, or murmurs. No lower extremity edema. Pulmonary: positive breath sounds bilaterally, adequate air movement, no wheezing, rhonchi or rales. Gastrointestinal.  Abdomen with no organomegaly, non tender, no rebound or guarding Skin. Left foot with improvement of erythema and edema, continue to have diminished pulses and all five toes with dry gangrene.  Musculoskeletal: no new joint deformities     Data Reviewed: I have personally reviewed following labs and imaging studies  CBC: Recent Labs  Lab 02/15/19 0110 02/16/19 0535 02/17/19 0414 02/18/19 0412  02/19/19 0336  WBC 15.6* 9.7 12.4* 11.1* 10.5  NEUTROABS 11.6*  --   --  7.3 6.3  HGB 11.4* 10.5* 11.1* 10.4* 9.6*  HCT 36.8* 34.0* 36.1* 32.9* 31.3*  MCV 93.4 92.1 93.5 91.1 92.6  PLT 243 216 260 261 409   Basic Metabolic Panel: Recent Labs  Lab 02/15/19 0110 02/16/19 0535 02/17/19 0414 02/18/19 0412 02/19/19 0336  NA 138 141 141 143 143  K 3.5 3.4* 3.6 3.2* 4.4  CL 103 104 105 106 108  CO2 23 24 24 25 25   GLUCOSE 139* 129* 132* 112* 136*  BUN 17 9 5* 5* 7*  CREATININE 0.95 0.99 1.05 0.90 0.91  CALCIUM 8.5* 8.3* 8.6* 8.5* 8.5*  MG  --   --  1.7  --   --    GFR: Estimated Creatinine Clearance: 64.6 mL/min (by C-G formula based on SCr of 0.91 mg/dL). Liver Function Tests: Recent Labs  Lab 02/15/19 0110 02/16/19 0535  AST 35 23  ALT 26 23  ALKPHOS 43 35*  BILITOT 0.7 0.7  PROT 6.1* 5.0*  ALBUMIN 3.1* 2.5*   No results for input(s): LIPASE, AMYLASE in the last 168 hours. No results for input(s): AMMONIA in the last 168 hours. Coagulation Profile: Recent Labs  Lab 02/15/19 0110  INR 1.0   Cardiac Enzymes: No results for input(s): CKTOTAL, CKMB, CKMBINDEX, TROPONINI in the last 168 hours. BNP (last 3 results) No results for input(s): PROBNP in the last 8760 hours. HbA1C: Recent Labs    02/17/19 0414  HGBA1C 6.5*   CBG: Recent Labs  Lab 02/18/19 0604 02/18/19 1120 02/18/19 1725 02/18/19 2110 02/19/19 0611  GLUCAP 119* 275* 206* 210* 121*   Lipid Profile: No results for input(s): CHOL, HDL, LDLCALC, TRIG, CHOLHDL, LDLDIRECT in the last 72 hours. Thyroid Function Tests: No results for input(s): TSH, T4TOTAL, FREET4, T3FREE, THYROIDAB in the last 72 hours. Anemia Panel: No results for input(s): VITAMINB12, FOLATE, FERRITIN, TIBC, IRON, RETICCTPCT in the last 72 hours.    Radiology Studies: I have reviewed all of the imaging during this hospital visit personally     Scheduled Meds: . allopurinol  100 mg Oral Daily  . amLODipine  2.5 mg Oral  Q supper  . aspirin EC  81 mg Oral Q supper  . atorvastatin  10 mg Oral q1800  . brimonidine  1 drop Both Eyes Q12H   And  . timolol  1 drop Both Eyes Q12H  . cholecalciferol  2,000 Units Oral Daily  . clopidogrel  300 mg Oral Once   Followed by  . clopidogrel  75 mg Oral Q breakfast  . ferrous sulfate  325 mg Oral Q breakfast  . insulin aspart  0-9 Units Subcutaneous TID WC  . latanoprost  1 drop Both Eyes QHS  . mesalamine  2,000 mg Oral BID  . multivitamin with minerals  1 tablet Oral Daily  . pantoprazole  40 mg Oral Q1500  . predniSONE  25 mg Oral QPC breakfast  . sodium chloride flush  3 mL Intravenous Q12H  . vitamin B-12  2,500 mcg Oral  Daily   Continuous Infusions: . sodium chloride    . cefTRIAXone (ROCEPHIN)  IV Stopped (02/18/19 1605)     LOS: 4 days        Judieth Mckown Gerome Apley, MD

## 2019-02-19 NOTE — Plan of Care (Signed)
  Problem: Health Behavior/Discharge Planning: Goal: Ability to manage health-related needs will improve Outcome: Progressing   Problem: Clinical Measurements: Goal: Ability to maintain clinical measurements within normal limits will improve Outcome: Progressing   

## 2019-02-19 NOTE — Progress Notes (Signed)
Results for DAYVEN, LINSLEY (MRN [id number]) as of 02/19/2019 13:13  Ref. Range 02/18/2019 11:20 02/18/2019 17:25 02/18/2019 21:10 02/19/2019 06:11 02/19/2019 11:58  Glucose-Capillary Latest Ref Range: 70 - 99 mg/dL 275 (H) 206 (H) 210 (H) 121 (H) 228 (H)  Noted that postprandial blood sugars have been greater than 180 mg/dl.  Recommend adding Novolog 3 units TID with meals if patient eats at least 50% of meal and if postprandial blood sugars continue to be elevated.   Harvel Ricks RN BSN CDE Diabetes Coordinator Pager: [phone]  8am-5pm

## 2019-02-20 DIAGNOSIS — M109 Gout, unspecified: Secondary | ICD-10-CM

## 2019-02-20 LAB — CBC WITH DIFFERENTIAL/PLATELET
Abs Immature Granulocytes: 0.1 10*3/uL — ABNORMAL HIGH (ref 0.00–0.07)
Basophils Absolute: 0 10*3/uL (ref 0.0–0.1)
Basophils Relative: 0 %
Eosinophils Absolute: 0.1 10*3/uL (ref 0.0–0.5)
Eosinophils Relative: 1 %
HCT: 31.5 % — ABNORMAL LOW (ref 39.0–52.0)
Hemoglobin: 9.7 g/dL — ABNORMAL LOW (ref 13.0–17.0)
Immature Granulocytes: 1 %
Lymphocytes Relative: 19 %
Lymphs Abs: 2.4 10*3/uL (ref 0.7–4.0)
MCH: 28.4 pg (ref 26.0–34.0)
MCHC: 30.8 g/dL (ref 30.0–36.0)
MCV: 92.4 fL (ref 80.0–100.0)
Monocytes Absolute: 1.3 10*3/uL — ABNORMAL HIGH (ref 0.1–1.0)
Monocytes Relative: 10 %
Neutro Abs: 8.9 10*3/uL — ABNORMAL HIGH (ref 1.7–7.7)
Neutrophils Relative %: 69 %
Platelets: 322 10*3/uL (ref 150–400)
RBC: 3.41 MIL/uL — ABNORMAL LOW (ref 4.22–5.81)
RDW: 13.3 % (ref 11.5–15.5)
WBC: 12.9 10*3/uL — ABNORMAL HIGH (ref 4.0–10.5)
nRBC: 0.2 % (ref 0.0–0.2)

## 2019-02-20 LAB — BASIC METABOLIC PANEL
Anion gap: 11 (ref 5–15)
BUN: 8 mg/dL (ref 8–23)
CO2: 23 mmol/L (ref 22–32)
Calcium: 8.6 mg/dL — ABNORMAL LOW (ref 8.9–10.3)
Chloride: 106 mmol/L (ref 98–111)
Creatinine, Ser: 0.79 mg/dL (ref 0.61–1.24)
GFR calc Af Amer: >60 mL/min (ref >60)
GFR calc non Af Amer: >60 mL/min (ref >60)
Glucose, Bld: 150 mg/dL — ABNORMAL HIGH (ref 70–99)
Potassium: 3.8 mmol/L (ref 3.5–5.1)
Sodium: 140 mmol/L (ref 135–145)

## 2019-02-20 LAB — CULTURE, BLOOD (ROUTINE X 2)
Culture: NO GROWTH
Culture: NO GROWTH

## 2019-02-20 LAB — GLUCOSE, CAPILLARY: Glucose-Capillary: 101 mg/dL — ABNORMAL HIGH (ref 70–99)

## 2019-02-20 MED ORDER — CEPHALEXIN 500 MG PO CAPS
500.0000 mg | ORAL_CAPSULE | Freq: Three times a day (TID) | ORAL | Status: DC
  Administered 2019-02-20: 500 mg via ORAL
  Filled 2019-02-20: qty 1

## 2019-02-20 MED ORDER — CLOPIDOGREL BISULFATE 75 MG PO TABS: 75.0000 mg | ORAL_TABLET | Freq: Every day | ORAL | 0 refills | Status: AC

## 2019-02-20 MED ORDER — PRAVASTATIN SODIUM 10 MG PO TABS: 10.0000 mg | ORAL_TABLET | Freq: Every evening | ORAL | 0 refills | Status: DC

## 2019-02-20 MED ORDER — CEPHALEXIN 500 MG PO CAPS: 500.0000 mg | ORAL_CAPSULE | Freq: Three times a day (TID) | ORAL | 0 refills | Status: DC

## 2019-02-20 MED ORDER — PRAVASTATIN SODIUM 10 MG PO TABS: 20.0000 mg | ORAL_TABLET | Freq: Every evening | ORAL | 0 refills | Status: DC

## 2019-02-20 NOTE — Progress Notes (Signed)
Patient discharged today and picked up by wife.  Patient's IV was removed and patient left with all of their personal belongings.  AVS documentation was reviewed with patient and wife and all questions were answered.

## 2019-02-20 NOTE — Progress Notes (Signed)
Chart reviewed. Pt's PCP is Harriet But[place] PA at [facility], information updated. Pt will need to follow up with PCP in 1 week. Jonnie Finner RN CCM Case Mgmt phone [phone]

## 2019-02-20 NOTE — Discharge Summary (Signed)
Physician Discharge Summary  Rodney Sullivan UKG:[id number] DOB: 07-Sep-1940 DOA: 02/15/2019  PCP: System, Provider Not In  Admit date: 02/15/2019 Discharge date: 02/20/2019  Admitted From: home  Disposition:  Home  Recommendations for Outpatient Follow-up and new medication changes:  1. Follow up with Primary Care in 7 days 2. Continue with cephalexin for 10 days. 3. Added clopidogrel 75 mg daily. 4. Continue aspirin and statin therapy. 5. Follow up with Vascular Surgery as scheduled.     Home Health: No   Equipment/Devices: No    Discharge Condition: stable  CODE STATUS: full  Diet recommendation: heart healthy and diabetic prudent.   Brief/Interim Summary: 78 year old male who presented with left foot cellulitis. He does have significant past medical history for Crohn's disease, history of PE/DVT and peripheral vascular disease.He reported 7 days of left foot rash.On his initial physical examination blood pressure 109/70, heart rate 91, temperature 39.3, respiratory rate 18, oxygen saturation 98%. His lungs had rales at the right base, heart S1-S2 present and rhythmic, abdomen soft and nontender, no lower extremity edema. Left foot with rash, dark red area on the tip of the first toe, with necrosis/ gangrene in all toes.  Sodium 138, potassium 3.5, chloride 103, bicarb 23, glucose 139, BUN 17, creatinine 0.95, lactic acid 3.5 white count 15.6, hemoglobin 11.4, hematocrit 36.8, platelets 243, sed rate 31.  SARS COVID-19 negative.  Urine analysis negative for infection.  Chest x-ray no infiltrates.  Foot x-ray with no radiographic findings to suggest acute osteomyelitis or evidence of soft tissue gas.  EKG 110 bpm, left axis deviation, normal intervals, no ST segment or T wave changes, positive PACs.  Patient was admitted to the hospital with a working diagnosis of sepsis due to left foot cellulitis.  1.  Left foot cellulitis in the setting of severe peripheral vascular disease,  complicated with all 5 toes dry gangrene.  Status post angiography, atherectomy and angioplasty.  Patient was admitted to the medical ward, he received broad-spectrum antibiotic therapy and as needed analgesics.  Patient was seen by vascular surgery, underwent left posterior tibial artery mechanical atherectomy, left posterior tibial artery angioplasty, left posterior tibial artery angioplasty at the ankle, with no major complications. His antibiotic therapy was narrowed to IV ceftriaxone with good toleration.  His left foot rash and edema continued to improve.  He had persistent dry gangrene changes to all his toes on his left foot.  He will continue dual antiplatelet therapy with aspirin and clopidogrel, plus statin therapy.   2.  Type 2 diabetes mellitus.  Patient was placed on insulin sliding scale for glucose coverage monitoring, his glucose remained well controlled. At discharge will resume pioglitazone.   3.  Hypertension.  He was placed on amlodipine for blood pressure control.  4.  Crohn's disease.  Patient was continued on prednisone and mesalamine.  No active flare.  5.  Hypokalemia and hypomagnesemia.  His electrolytes were corrected, his discharge potassium 3.8, serum creatinine 0.79.  6.  Iron deficiency anemia.  Patient will continue iron supplements, his cell count remained stable.  Discharge Diagnoses:  Principal Problem:   Sepsis ([place]) Active Problems:   Crohn disease ([place])   Cellulitis and abscess of toe of left foot   Peripheral arterial disease ([place])   DM (diabetes mellitus), type 2 with peripheral vascular complications ([place])   Gout    Discharge Instructions   Allergies as of 02/20/2019      Reactions   Ciprofloxacin Other (See Comments)   tendons hurt  Shrimp [shellfish Allergy]       Medication List    STOP taking these medications   HYDROcodone-acetaminophen 5-325 MG tablet Commonly known as: Norco   sulfamethoxazole-trimethoprim 800-160 MG tablet  Commonly known as: BACTRIM DS     TAKE these medications   albuterol 108 (90 Base) MCG/ACT inhaler Commonly known as: VENTOLIN HFA Inhale 2 puffs into the lungs every 4 (four) hours as needed for wheezing or shortness of breath.   allopurinol 100 MG tablet Commonly known as: ZYLOPRIM Take 100 mg by mouth daily.   amLODipine 2.5 MG tablet Commonly known as: NORVASC Take 2.5 mg by mouth daily with supper.   aspirin EC 81 MG tablet Take 81 mg by mouth daily with supper.   B-12 2500 MCG Tabs Take 2,500 mcg by mouth daily.   benzonatate 100 MG capsule Commonly known as: TESSALON Take 100 mg by mouth 3 (three) times daily as needed for cough.   cephALEXin 500 MG capsule Commonly known as: KEFLEX Take 1 capsule (500 mg total) by mouth every 8 (eight) hours for 10 days.   Cimzia 2 X 200 MG Kit Generic drug: Certolizumab Pegol Inject 400 mg into the skin every 28 (twenty-eight) days.   clopidogrel 75 MG tablet Commonly known as: PLAVIX Take 1 tablet (75 mg total) by mouth daily with breakfast.   clotrimazole-betamethasone cream Commonly known as: LOTRISONE Apply 1 application topically 2 (two) times daily as needed (fungus). On the side of mouth   Combigan 0.2-0.5 % ophthalmic solution Generic drug: brimonidine-timolol Place 1 drop into both eyes 2 (two) times a day.   cyclobenzaprine 10 MG tablet Commonly known as: FLEXERIL Take 10 mg by mouth daily as needed for muscle spasms.   Dexamethasone Acetate 8 MG/ML Susp Inject 8 mg into the muscle daily as needed (for Crohn's flare).   diphenhydrAMINE 25 MG tablet Commonly known as: BENADRYL Take 25 mg by mouth 2 (two) times daily as needed for allergies or sleep.   famciclovir 250 MG tablet Commonly known as: FAMVIR Take 250 mg by mouth daily as needed (fungus on corner of mouth flare).   ferrous sulfate 325 (65 FE) MG tablet Take 325 mg by mouth daily.   Fish Oil 1000 MG Caps Take 1,000 mg by mouth daily.    ketoconazole 200 MG tablet Commonly known as: NIZORAL Take 200 mg by mouth daily as needed (yeast infection).   latanoprost 0.005 % ophthalmic solution Commonly known as: XALATAN Place 1 drop into both eyes at bedtime.   mesalamine 500 MG CR capsule Commonly known as: PENTASA Take 2,000 mg by mouth 2 (two) times daily.   multivitamin with minerals Tabs tablet Take 1 tablet by mouth daily.   OVER THE COUNTER MEDICATION Take 5 mLs by mouth 2 (two) times daily. CBD oil drops   pantoprazole 40 MG tablet Commonly known as: PROTONIX Take 40 mg by mouth daily at 3 pm.   pioglitazone 15 MG tablet Commonly known as: ACTOS Take 15 mg by mouth daily.   pravastatin 10 MG tablet Commonly known as: PRAVACHOL Take 1 tablet (10 mg total) by mouth every evening.   predniSONE 10 MG tablet Commonly known as: DELTASONE Take 25 mg by mouth daily after breakfast.   Vitamin D 50 MCG (2000 UT) tablet Take 2,000 Units by mouth daily.   Zims Crack Creme Crea Apply 1 application topically daily as needed (dry skin).       Allergies  Allergen Reactions  . Ciprofloxacin Other (  See Comments)    tendons hurt  . Shrimp [Shellfish Allergy]     Consultations: Vascular surgery.   Procedures/Studies: Dg Chest 2 View  Result Date: 02/15/2019 CLINICAL DATA:  78 year old male with fever. Concern for sepsis. EXAM: CHEST - 2 VIEW COMPARISON:  Chest radiograph dated 07/15/2017 FINDINGS: Left lung base atelectatic changes. No focal consolidation, pleural effusion, or pneumothorax. Stable mild cardiomegaly. Atherosclerotic calcification of the aortic arch. No acute osseous pathology. IMPRESSION: No active cardiopulmonary disease.  No interval change. Electronically Signed   By: Anner Crete M.D.   On: 02/15/2019 02:02   Mr Foot Left W Wo Contrast  Result Date: 02/15/2019 CLINICAL DATA:  Left foot erythema and swelling for 5 days. History of diabetes. Concern for osteomyelitis. EXAM: MRI OF THE  LEFT FOREFOOT WITHOUT AND WITH CONTRAST TECHNIQUE: Multiplanar, multisequence MR imaging of the left forefoot was performed both before and after administration of intravenous contrast. CONTRAST:  80 cc Gadavist COMPARISON:  Left foot radiographs earlier today. FINDINGS: Despite efforts by the technologist and patient, moderate motion artifact is present on today's exam and could not be eliminated. This reduces exam sensitivity and specificity. Bones/Joint/Cartilage No evidence of acute fracture, dislocation or bone destruction. There is no suspicious marrow enhancement or significant joint effusion. There are mild degenerative changes at the Lisfranc joint without subluxation. There are minimal degenerative changes at the 1st metatarsophalangeal joint. Ligaments The Lisfranc ligament and the collateral ligaments of the metatarsophalangeal joints are intact. Muscles and Tendons No focal muscular abnormality or abnormal enhancement identified. There is mild probable diabetic myopathy in the forefoot. No significant tenosynovitis. Soft tissues Mild nonspecific forefoot subcutaneous edema, greatest dorsally. No focal fluid collection, obvious skin ulceration or suspicious soft tissue enhancement. IMPRESSION: 1. The study is moderately motion degraded. 2. No evidence of osteomyelitis or septic joint. 3. Nonspecific forefoot soft tissue edema, possibly cellulitis. No focal fluid collection identified. 4. Mild midfoot degenerative changes. Electronically Signed   By: Richardean Sale M.D.   On: 02/15/2019 08:14   Dg Foot 2 Views Left  Result Date: 02/15/2019 CLINICAL DATA:  Fever, foot infection EXAM: LEFT FOOT - 2 VIEW COMPARISON:  None. FINDINGS: No fracture or dislocation is seen. The joint spaces are preserved. No evidence of soft tissue gas. Vascular calcifications calcification. IMPRESSION: No radiographic findings to suggest acute osteomyelitis. No evidence of soft tissue gas. Electronically Signed   By: Julian Hy M.D.   On: 02/15/2019 03:02      Procedures: Left posterior tibial vein atherectomy and angioplasty.  Left posterior tibial artery angioplasty at the ankle.  Subjective: Patient is feeling better, improved left foot edema, erythema and sensitivity, no nausea or vomiting, no chest pain or dyspnea.   Discharge Exam: Vitals:   02/20/19 0540 02/20/19 0812  BP: (!) 145/79 (!) 149/69  Pulse: 83   Resp: 16   Temp: 98.8 F (37.1 C) 98.6 F (37 C)  SpO2: 98%    Vitals:   02/19/19 1822 02/19/19 2032 02/20/19 0540 02/20/19 0812  BP: 133/78 136/78 (!) 145/79 (!) 149/69  Pulse: 97 100 83   Resp:  20 16   Temp:  98.6 F (37 C) 98.8 F (37.1 C) 98.6 F (37 C)  TempSrc:  Oral Oral   SpO2: 95% 96% 98%   Weight:   76.3 kg   Height:        General: Not in pain or dyspnea.  Neurology: Awake and alert, non focal  E ENT: no pallor, no icterus,  oral mucosa moist Cardiovascular: No JVD. S1-S2 present, rhythmic, no gallops, rubs, or murmurs. Left foot with non pitting edema, no right lower extremity edema. Pulmonary: vesicular breath sounds bilaterally, adequate air movement, no wheezing, rhonchi or rales. Gastrointestinal. Abdomen with, no organomegaly, non tender, no rebound or guarding Skin. Left foot discoloration and edema, necrotic all five toes.  Musculoskeletal: no joint deformities             The results of significant diagnostics from this hospitalization (including imaging, microbiology, ancillary and laboratory) are listed below for reference.     Microbiology: Recent Results (from the past 240 hour(s))  Culture, blood (Routine x 2)     Status: None (Preliminary result)   Collection Time: 02/15/19  1:10 AM   Specimen: BLOOD  Result Value Ref Range Status   Specimen Description   Final    BLOOD RIGHT ANTECUBITAL Performed at [facility] [address], [place], [place] [address]    Special Requests   Final    BOTTLES DRAWN  AEROBIC AND ANAEROBIC Blood Culture results may not be optimal due to an excessive volume of blood received in culture bottles Performed at [facility] [address], [place], [place] [address]    Culture   Final    NO GROWTH 4 DAYS Performed at [facility], [place] [address], [place], [place] [address]    Report Status PENDING  Incomplete  Culture, blood (Routine x 2)     Status: None (Preliminary result)   Collection Time: 02/15/19  1:11 AM   Specimen: BLOOD LEFT HAND  Result Value Ref Range Status   Specimen Description   Final    BLOOD LEFT HAND Performed at [facility] [address] [place], [place] [address]    Special Requests   Final    BOTTLES DRAWN AEROBIC AND ANAEROBIC Blood Culture results may not be optimal due to an excessive volume of blood received in culture bottles Performed at [facility] [address], [place], [place] [address]    Culture   Final    NO GROWTH 4 DAYS Performed at [facility], [place] [address], [place], [place] [address]    Report Status PENDING  Incomplete  Urine culture     Status: Abnormal   Collection Time: 02/15/19  1:51 AM   Specimen: In/Out Cath Urine  Result Value Ref Range Status   Specimen Description   Final    IN/OUT CATH URINE Performed at [facility] [address], [place], [place] [address]    Special Requests   Final    NONE Performed at [facility], [place] [address] [place], [place] [address]    Culture MULTIPLE SPECIES PRESENT, SUGGEST RECOLLECTION (A)  Final   Report Status 02/16/2019 FINAL  Final  SARS Coronavirus 2 [facility] order, Performed in [facility] hospital lab) Nasopharyngeal Nasopharyngeal Swab     Status: None   Collection Time: 02/15/19  2:52 AM   Specimen: Nasopharyngeal Swab  Result Value Ref Range Status   SARS Coronavirus 2 NEGATIVE NEGATIVE Final    Comment: (NOTE) If result is  NEGATIVE SARS-CoV-2 target nucleic acids are NOT DETECTED. The SARS-CoV-2 RNA is generally detectable in upper and lower  respiratory specimens during the acute phase of infection. The lowest  concentration of SARS-CoV-2 viral copies this assay can detect is 250  copies / mL. A negative result does not preclude SARS-CoV-2 infection  and should not be used as the sole basis for treatment or other  patient management decisions.  A negative result may occur with  improper specimen collection / handling, submission of specimen other  than nasopharyngeal swab, presence of viral mutation(s) within the  areas targeted by this assay, and inadequate number of viral copies  (<250 copies / mL). A negative result must be combined with clinical  observations, patient history, and epidemiological information. If result is POSITIVE SARS-CoV-2 target nucleic acids are DETECTED. The SARS-CoV-2 RNA is generally detectable in upper and lower  respiratory specimens dur ing the acute phase of infection.  Positive  results are indicative of active infection with SARS-CoV-2.  Clinical  correlation with patient history and other diagnostic information is  necessary to determine patient infection status.  Positive results do  not rule out bacterial infection or co-infection with other viruses. If result is PRESUMPTIVE POSTIVE SARS-CoV-2 nucleic acids MAY BE PRESENT.   A presumptive positive result was obtained on the submitted specimen  and confirmed on repeat testing.  While 2019 novel coronavirus  (SARS-CoV-2) nucleic acids may be present in the submitted sample  additional confirmatory testing may be necessary for epidemiological  and / or clinical management purposes  to differentiate between  SARS-CoV-2 and other Sarbecovirus currently known to infect humans.  If clinically indicated additional testing with an alternate test  methodology [phone]) is advised. The SARS-CoV-2 RNA is generally  detectable  in upper and lower respiratory sp ecimens during the acute  phase of infection. The expected result is Negative. Fact Sheet for Patients:  StrictlyIdeas.no Fact Sheet for Healthcare Providers: BankingDealers.co.za This test is not yet approved or cleared by the [place] FDA and has been authorized for detection and/or diagnosis of SARS-CoV-2 by FDA under an Emergency Use Authorization (EUA).  This EUA will remain in effect (meaning this test can be used) for the duration of the COVID-19 declaration under Section 564(b)(1) of the Act, 21 U.S.C. section 360bbb-3(b)(1), unless the authorization is terminated or revoked sooner. Performed at [facility], [place] [address] [place], [place] [address]      Labs: BNP (last 3 results) No results for input(s): BNP in the last 8760 hours. Basic Metabolic Panel: Recent Labs  Lab 02/16/19 0535 02/17/19 0414 02/18/19 0412 02/19/19 0336 02/20/19 0325  NA 141 141 143 143 140  K 3.4* 3.6 3.2* 4.4 3.8  CL 104 105 106 108 106  CO2 24 24 25 25 23   GLUCOSE 129* 132* 112* 136* 150*  BUN 9 5* 5* 7* 8  CREATININE 0.99 1.05 0.90 0.91 0.79  CALCIUM 8.3* 8.6* 8.5* 8.5* 8.6*  MG  --  1.7  --   --   --    Liver Function Tests: Recent Labs  Lab 02/15/19 0110 02/16/19 0535  AST 35 23  ALT 26 23  ALKPHOS 43 35*  BILITOT 0.7 0.7  PROT 6.1* 5.0*  ALBUMIN 3.1* 2.5*   No results for input(s): LIPASE, AMYLASE in the last 168 hours. No results for input(s): AMMONIA in the last 168 hours. CBC: Recent Labs  Lab 02/15/19 0110 02/16/19 0535 02/17/19 0414 02/18/19 0412 02/19/19 0336 02/20/19 0325  WBC 15.6* 9.7 12.4* 11.1* 10.5 12.9*  NEUTROABS 11.6*  --   --  7.3 6.3 8.9*  HGB 11.4* 10.5* 11.1* 10.4* 9.6* 9.7*  HCT 36.8* 34.0* 36.1* 32.9* 31.3* 31.5*  MCV 93.4 92.1 93.5 91.1 92.6 92.4  PLT 243 216 260 261 269 322  Cardiac Enzymes: No results for input(s): CKTOTAL, CKMB,  CKMBINDEX, TROPONINI in the last 168 hours. BNP: Invalid input(s): POCBNP CBG: Recent Labs  Lab 02/19/19 0611 02/19/19 1158 02/19/19 1811 02/19/19 2127 02/20/19 0624  GLUCAP 121* 228* 270* 198* 101*   D-Dimer No results for input(s): DDIMER in the last 72 hours. Hgb A1c No results for input(s): HGBA1C in the last 72 hours. Lipid Profile No results for input(s): CHOL, HDL, LDLCALC, TRIG, CHOLHDL, LDLDIRECT in the last 72 hours. Thyroid function studies No results for input(s): TSH, T4TOTAL, T3FREE, THYROIDAB in the last 72 hours.  Invalid input(s): FREET3 Anemia work up No results for input(s): VITAMINB12, FOLATE, FERRITIN, TIBC, IRON, RETICCTPCT in the last 72 hours. Urinalysis    Component Value Date/Time   COLORURINE YELLOW 02/15/2019 0110   APPEARANCEUR CLEAR 02/15/2019 0110   LABSPEC 1.015 02/15/2019 0110   PHURINE 6.0 02/15/2019 0110   GLUCOSEU NEGATIVE 02/15/2019 0110   HGBUR NEGATIVE 02/15/2019 0110   BILIRUBINUR NEGATIVE 02/15/2019 0110   KETONESUR 5 (A) 02/15/2019 0110   PROTEINUR NEGATIVE 02/15/2019 0110   UROBILINOGEN 0.2 12/08/2014 1356   NITRITE NEGATIVE 02/15/2019 0110   LEUKOCYTESUR NEGATIVE 02/15/2019 0110   Sepsis Labs Invalid input(s): PROCALCITONIN,  WBC,  LACTICIDVEN Microbiology Recent Results (from the past 240 hour(s))  Culture, blood (Routine x 2)     Status: None (Preliminary result)   Collection Time: 02/15/19  1:10 AM   Specimen: BLOOD  Result Value Ref Range Status   Specimen Description   Final    BLOOD RIGHT ANTECUBITAL Performed at [facility], [place] [address], [place], [place] [address]    Special Requests   Final    BOTTLES DRAWN AEROBIC AND ANAEROBIC Blood Culture results may not be optimal due to an excessive volume of blood received in culture bottles Performed at [facility] [address], [place], [place] [address]    Culture   Final    NO GROWTH 4 DAYS Performed at [facility], [place] [address], [place], [place] [address]    Report Status PENDING  Incomplete  Culture, blood (Routine x 2)     Status: None (Preliminary result)   Collection Time: 02/15/19  1:11 AM   Specimen: BLOOD LEFT HAND  Result Value Ref Range Status   Specimen Description   Final    BLOOD LEFT HAND Performed at [facility] [address] [place], [place] [address]    Special Requests   Final    BOTTLES DRAWN AEROBIC AND ANAEROBIC Blood Culture results may not be optimal due to an excessive volume of blood received in culture bottles Performed at [facility] [address], [place], [place] [address]    Culture   Final    NO GROWTH 4 DAYS Performed at [facility], [place] [address], [place], [place] [address]    Report Status PENDING  Incomplete  Urine culture     Status: Abnormal   Collection Time: 02/15/19  1:51 AM   Specimen: In/Out Cath Urine  Result Value Ref Range Status   Specimen Description   Final    IN/OUT CATH URINE Performed at [facility] [address] [place], [place] [address]    Special Requests   Final    NONE Performed at [facility], [place] [address], [place], [place] [address]    Culture MULTIPLE SPECIES PRESENT, SUGGEST RECOLLECTION (A)  Final   Report Status  02/16/2019 FINAL  Final  SARS Coronavirus 2 [facility] order, Performed in [facility] hospital lab) Nasopharyngeal Nasopharyngeal Swab     Status: None   Collection Time: 02/15/19  2:52 AM   Specimen: Nasopharyngeal Swab  Result Value Ref Range Status   SARS Coronavirus 2 NEGATIVE NEGATIVE Final    Comment: (NOTE) If result is NEGATIVE SARS-CoV-2 target nucleic acids are NOT DETECTED. The SARS-CoV-2 RNA is generally detectable in upper and lower  respiratory specimens during the acute phase of infection. The lowest  concentration of SARS-CoV-2 viral copies this assay can detect is 250  copies / mL. A  negative result does not preclude SARS-CoV-2 infection  and should not be used as the sole basis for treatment or other  patient management decisions.  A negative result may occur with  improper specimen collection / handling, submission of specimen other  than nasopharyngeal swab, presence of viral mutation(s) within the  areas targeted by this assay, and inadequate number of viral copies  (<250 copies / mL). A negative result must be combined with clinical  observations, patient history, and epidemiological information. If result is POSITIVE SARS-CoV-2 target nucleic acids are DETECTED. The SARS-CoV-2 RNA is generally detectable in upper and lower  respiratory specimens dur ing the acute phase of infection.  Positive  results are indicative of active infection with SARS-CoV-2.  Clinical  correlation with patient history and other diagnostic information is  necessary to determine patient infection status.  Positive results do  not rule out bacterial infection or co-infection with other viruses. If result is PRESUMPTIVE POSTIVE SARS-CoV-2 nucleic acids MAY BE PRESENT.   A presumptive positive result was obtained on the submitted specimen  and confirmed on repeat testing.  While 2019 novel coronavirus  (SARS-CoV-2) nucleic acids may be present in the submitted sample  additional confirmatory testing may be necessary for epidemiological  and / or clinical management purposes  to differentiate between  SARS-CoV-2 and other Sarbecovirus currently known to infect humans.  If clinically indicated additional testing with an alternate test  methodology [phone]) is advised. The SARS-CoV-2 RNA is generally  detectable in upper and lower respiratory sp ecimens during the acute  phase of infection. The expected result is Negative. Fact Sheet for Patients:  StrictlyIdeas.no Fact Sheet for Healthcare Providers: BankingDealers.co.za This test is not  yet approved or cleared by the [place] FDA and has been authorized for detection and/or diagnosis of SARS-CoV-2 by FDA under an Emergency Use Authorization (EUA).  This EUA will remain in effect (meaning this test can be used) for the duration of the COVID-19 declaration under Section 564(b)(1) of the Act, 21 U.S.C. section 360bbb-3(b)(1), unless the authorization is terminated or revoked sooner. Performed at [facility], [place] [address], [place], [place] [address]      Time coordinating discharge: 45 minutes  SIGNED:   Tawni Millers, MD  [facility] 02/20/2019, 8:31 AM

## 2019-03-01 ENCOUNTER — Inpatient Hospital Stay (HOSPITAL_COMMUNITY)
Admission: EM | Admit: 2019-03-01 | Discharge: 2019-03-18 | DRG: 239 | Disposition: A | Discharge status: Home Health | Payer: Medicare HMO | Attending: Vascular Surgery | Admitting: Vascular Surgery

## 2019-03-01 ENCOUNTER — Encounter (HOSPITAL_COMMUNITY): Payer: Self-pay

## 2019-03-01 ENCOUNTER — Other Ambulatory Visit: Payer: Self-pay

## 2019-03-01 ENCOUNTER — Emergency Department (HOSPITAL_COMMUNITY): Payer: Medicare HMO

## 2019-03-01 DIAGNOSIS — L97529 Non-pressure chronic ulcer of other part of left foot with unspecified severity: Secondary | ICD-10-CM | POA: Diagnosis present

## 2019-03-01 DIAGNOSIS — Z79899 Other long term (current) drug therapy: Secondary | ICD-10-CM | POA: Diagnosis not present

## 2019-03-01 DIAGNOSIS — Z87891 Personal history of nicotine dependence: Secondary | ICD-10-CM | POA: Diagnosis not present

## 2019-03-01 DIAGNOSIS — E1152 Type 2 diabetes mellitus with diabetic peripheral angiopathy with gangrene: Principal | ICD-10-CM | POA: Diagnosis present

## 2019-03-01 DIAGNOSIS — Z89021 Acquired absence of right finger(s): Secondary | ICD-10-CM

## 2019-03-01 DIAGNOSIS — Z85828 Personal history of other malignant neoplasm of skin: Secondary | ICD-10-CM

## 2019-03-01 DIAGNOSIS — E43 Unspecified severe protein-calorie malnutrition: Secondary | ICD-10-CM | POA: Diagnosis present

## 2019-03-01 DIAGNOSIS — Z9049 Acquired absence of other specified parts of digestive tract: Secondary | ICD-10-CM

## 2019-03-01 DIAGNOSIS — K5090 Crohn's disease, unspecified, without complications: Secondary | ICD-10-CM | POA: Diagnosis present

## 2019-03-01 DIAGNOSIS — Z7982 Long term (current) use of aspirin: Secondary | ICD-10-CM | POA: Diagnosis not present

## 2019-03-01 DIAGNOSIS — I739 Peripheral vascular disease, unspecified: Secondary | ICD-10-CM

## 2019-03-01 DIAGNOSIS — Z9660 Presence of unspecified orthopedic joint implant: Secondary | ICD-10-CM | POA: Diagnosis present

## 2019-03-01 DIAGNOSIS — I96 Gangrene, not elsewhere classified: Secondary | ICD-10-CM | POA: Diagnosis not present

## 2019-03-01 DIAGNOSIS — E1140 Type 2 diabetes mellitus with diabetic neuropathy, unspecified: Secondary | ICD-10-CM | POA: Diagnosis present

## 2019-03-01 DIAGNOSIS — E11621 Type 2 diabetes mellitus with foot ulcer: Secondary | ICD-10-CM | POA: Diagnosis present

## 2019-03-01 DIAGNOSIS — Z8249 Family history of ischemic heart disease and other diseases of the circulatory system: Secondary | ICD-10-CM | POA: Diagnosis not present

## 2019-03-01 DIAGNOSIS — Z7984 Long term (current) use of oral hypoglycemic drugs: Secondary | ICD-10-CM

## 2019-03-01 DIAGNOSIS — Z7902 Long term (current) use of antithrombotics/antiplatelets: Secondary | ICD-10-CM

## 2019-03-01 DIAGNOSIS — Z7952 Long term (current) use of systemic steroids: Secondary | ICD-10-CM | POA: Diagnosis not present

## 2019-03-01 DIAGNOSIS — Z20828 Contact with and (suspected) exposure to other viral communicable diseases: Secondary | ICD-10-CM | POA: Diagnosis present

## 2019-03-01 DIAGNOSIS — Z91013 Allergy to seafood: Secondary | ICD-10-CM | POA: Diagnosis not present

## 2019-03-01 DIAGNOSIS — Z89511 Acquired absence of right leg below knee: Secondary | ICD-10-CM | POA: Diagnosis not present

## 2019-03-01 DIAGNOSIS — Z6825 Body mass index (BMI) 25.0-25.9, adult: Secondary | ICD-10-CM | POA: Diagnosis not present

## 2019-03-01 DIAGNOSIS — Z881 Allergy status to other antibiotic agents status: Secondary | ICD-10-CM

## 2019-03-01 DIAGNOSIS — M79672 Pain in left foot: Secondary | ICD-10-CM | POA: Diagnosis present

## 2019-03-01 DIAGNOSIS — I998 Other disorder of circulatory system: Secondary | ICD-10-CM | POA: Diagnosis present

## 2019-03-01 DIAGNOSIS — Z86711 Personal history of pulmonary embolism: Secondary | ICD-10-CM | POA: Diagnosis not present

## 2019-03-01 LAB — COMPREHENSIVE METABOLIC PANEL
ALT: 31 U/L (ref 0–44)
AST: 21 U/L (ref 15–41)
Albumin: 2.9 g/dL — ABNORMAL LOW (ref 3.5–5.0)
Alkaline Phosphatase: 51 U/L (ref 38–126)
Anion gap: 13 (ref 5–15)
BUN: 13 mg/dL (ref 8–23)
CO2: 21 mmol/L — ABNORMAL LOW (ref 22–32)
Calcium: 8.8 mg/dL — ABNORMAL LOW (ref 8.9–10.3)
Chloride: 106 mmol/L (ref 98–111)
Creatinine, Ser: 0.98 mg/dL (ref 0.61–1.24)
GFR calc Af Amer: >60 mL/min (ref >60)
GFR calc non Af Amer: >60 mL/min (ref >60)
Glucose, Bld: 198 mg/dL — ABNORMAL HIGH (ref 70–99)
Potassium: 3.8 mmol/L (ref 3.5–5.1)
Sodium: 140 mmol/L (ref 135–145)
Total Bilirubin: 0.6 mg/dL (ref 0.3–1.2)
Total Protein: 5.5 g/dL — ABNORMAL LOW (ref 6.5–8.1)

## 2019-03-01 LAB — CBC WITH DIFFERENTIAL/PLATELET
Abs Immature Granulocytes: 0.21 10*3/uL — ABNORMAL HIGH (ref 0.00–0.07)
Basophils Absolute: 0.1 10*3/uL (ref 0.0–0.1)
Basophils Relative: 0 %
Eosinophils Absolute: 0.2 10*3/uL (ref 0.0–0.5)
Eosinophils Relative: 1 %
HCT: 39.6 % (ref 39.0–52.0)
Hemoglobin: 12.1 g/dL — ABNORMAL LOW (ref 13.0–17.0)
Immature Granulocytes: 2 %
Lymphocytes Relative: 17 %
Lymphs Abs: 2.1 10*3/uL (ref 0.7–4.0)
MCH: 28.3 pg (ref 26.0–34.0)
MCHC: 30.6 g/dL (ref 30.0–36.0)
MCV: 92.7 fL (ref 80.0–100.0)
Monocytes Absolute: 1.6 10*3/uL — ABNORMAL HIGH (ref 0.1–1.0)
Monocytes Relative: 12 %
Neutro Abs: 8.5 10*3/uL — ABNORMAL HIGH (ref 1.7–7.7)
Neutrophils Relative %: 68 %
Platelets: 358 10*3/uL (ref 150–400)
RBC: 4.27 MIL/uL (ref 4.22–5.81)
RDW: 14.1 % (ref 11.5–15.5)
WBC: 12.6 10*3/uL — ABNORMAL HIGH (ref 4.0–10.5)
nRBC: 0.2 % (ref 0.0–0.2)

## 2019-03-01 LAB — BASIC METABOLIC PANEL
Anion gap: 13 (ref 5–15)
BUN: 12 mg/dL (ref 8–23)
CO2: 23 mmol/L (ref 22–32)
Calcium: 8.6 mg/dL — ABNORMAL LOW (ref 8.9–10.3)
Chloride: 104 mmol/L (ref 98–111)
Creatinine, Ser: 1.02 mg/dL (ref 0.61–1.24)
GFR calc Af Amer: >60 mL/min (ref >60)
GFR calc non Af Amer: >60 mL/min (ref >60)
Glucose, Bld: 239 mg/dL — ABNORMAL HIGH (ref 70–99)
Potassium: 4.1 mmol/L (ref 3.5–5.1)
Sodium: 140 mmol/L (ref 135–145)

## 2019-03-01 LAB — LACTIC ACID, PLASMA
Lactic Acid, Venous: 3.9 mmol/L (ref 0.5–1.9)
Lactic Acid, Venous: 4.7 mmol/L (ref 0.5–1.9)

## 2019-03-01 LAB — CBC
HCT: 36 % — ABNORMAL LOW (ref 39.0–52.0)
Hemoglobin: 11.5 g/dL — ABNORMAL LOW (ref 13.0–17.0)
MCH: 28.5 pg (ref 26.0–34.0)
MCHC: 31.9 g/dL (ref 30.0–36.0)
MCV: 89.3 fL (ref 80.0–100.0)
Platelets: 368 10*3/uL (ref 150–400)
RBC: 4.03 MIL/uL — ABNORMAL LOW (ref 4.22–5.81)
RDW: 14.1 % (ref 11.5–15.5)
WBC: 13.4 10*3/uL — ABNORMAL HIGH (ref 4.0–10.5)
nRBC: 0 % (ref 0.0–0.2)

## 2019-03-01 LAB — CBG MONITORING, ED
Glucose-Capillary: 228 mg/dL — ABNORMAL HIGH (ref 70–99)
Glucose-Capillary: 183 mg/dL — ABNORMAL HIGH (ref 70–99)

## 2019-03-01 LAB — GLUCOSE, CAPILLARY: Glucose-Capillary: 122 mg/dL — ABNORMAL HIGH (ref 70–99)

## 2019-03-01 MED ORDER — CLOPIDOGREL BISULFATE 75 MG PO TABS: 75.0000 mg | ORAL_TABLET | Freq: Every day | ORAL | Status: DC

## 2019-03-01 MED ORDER — PIOGLITAZONE HCL 15 MG PO TABS
15.0000 mg | ORAL_TABLET | Freq: Every day | ORAL | Status: DC
  Administered 2019-03-02 – 2019-03-18 (×16): 15 mg via ORAL
  Filled 2019-03-01 (×18): qty 1

## 2019-03-01 MED ORDER — HYDRALAZINE HCL 20 MG/ML IJ SOLN: 5.0000 mg | INTRAMUSCULAR | Status: DC | PRN

## 2019-03-01 MED ORDER — PANTOPRAZOLE SODIUM 40 MG PO TBEC
40.0000 mg | DELAYED_RELEASE_TABLET | Freq: Every day | ORAL | Status: DC
  Administered 2019-03-01 – 2019-03-18 (×17): 40 mg via ORAL
  Filled 2019-03-01 (×17): qty 1

## 2019-03-01 MED ORDER — ACETAMINOPHEN 325 MG PO TABS
325.0000 mg | ORAL_TABLET | ORAL | Status: DC | PRN
  Administered 2019-03-02 – 2019-03-06 (×10): 650 mg via ORAL
  Administered 2019-03-07: 325 mg via ORAL
  Administered 2019-03-07 – 2019-03-17 (×21): 650 mg via ORAL
  Filled 2019-03-01 (×4): qty 2
  Filled 2019-03-01: qty 1
  Filled 2019-03-01 (×28): qty 2

## 2019-03-01 MED ORDER — BRIMONIDINE TARTRATE-TIMOLOL 0.2-0.5 % OP SOLN: 1.0000 [drp] | Freq: Two times a day (BID) | OPHTHALMIC | Status: DC

## 2019-03-01 MED ORDER — INSULIN ASPART 100 UNIT/ML ~~LOC~~ SOLN
0.0000 [IU] | Freq: Three times a day (TID) | SUBCUTANEOUS | Status: DC
  Administered 2019-03-01: 5 [IU] via SUBCUTANEOUS
  Administered 2019-03-01 – 2019-03-03 (×3): 3 [IU] via SUBCUTANEOUS
  Administered 2019-03-04: 8 [IU] via SUBCUTANEOUS
  Administered 2019-03-04: 07:00:00 3 [IU] via SUBCUTANEOUS
  Administered 2019-03-04: 5 [IU] via SUBCUTANEOUS
  Administered 2019-03-05: 2 [IU] via SUBCUTANEOUS
  Administered 2019-03-05: 5 [IU] via SUBCUTANEOUS
  Administered 2019-03-05: 3 [IU] via SUBCUTANEOUS
  Administered 2019-03-06: 5 [IU] via SUBCUTANEOUS
  Administered 2019-03-06: 8 [IU] via SUBCUTANEOUS
  Administered 2019-03-06 – 2019-03-07 (×2): 2 [IU] via SUBCUTANEOUS
  Administered 2019-03-07: 18:00:00 8 [IU] via SUBCUTANEOUS
  Administered 2019-03-08 (×2): 3 [IU] via SUBCUTANEOUS
  Administered 2019-03-08: 8 [IU] via SUBCUTANEOUS
  Administered 2019-03-09: 5 [IU] via SUBCUTANEOUS
  Administered 2019-03-09: 07:00:00 2 [IU] via SUBCUTANEOUS
  Administered 2019-03-09: 13:00:00 5 [IU] via SUBCUTANEOUS
  Administered 2019-03-10: 3 [IU] via SUBCUTANEOUS
  Administered 2019-03-10 – 2019-03-13 (×6): 5 [IU] via SUBCUTANEOUS
  Administered 2019-03-13 – 2019-03-14 (×2): 3 [IU] via SUBCUTANEOUS
  Administered 2019-03-14 – 2019-03-15 (×3): 5 [IU] via SUBCUTANEOUS
  Administered 2019-03-16: 12:00:00 2 [IU] via SUBCUTANEOUS
  Administered 2019-03-16 – 2019-03-17 (×3): 3 [IU] via SUBCUTANEOUS

## 2019-03-01 MED ORDER — LABETALOL HCL 5 MG/ML IV SOLN: 10.0000 mg | INTRAVENOUS | Status: DC | PRN

## 2019-03-01 MED ORDER — GUAIFENESIN-DM 100-10 MG/5ML PO SYRP: 15.0000 mL | ORAL_SOLUTION | ORAL | Status: DC | PRN

## 2019-03-01 MED ORDER — DIPHENHYDRAMINE HCL 25 MG PO CAPS
25.0000 mg | ORAL_CAPSULE | Freq: Two times a day (BID) | ORAL | Status: DC | PRN
  Administered 2019-03-03 – 2019-03-14 (×2): 25 mg via ORAL
  Filled 2019-03-01 (×4): qty 1

## 2019-03-01 MED ORDER — GLUCERNA SHAKE PO LIQD
237.0000 mL | Freq: Three times a day (TID) | ORAL | Status: DC
  Administered 2019-03-03 – 2019-03-18 (×16): 237 mL via ORAL
  Filled 2019-03-01 (×7): qty 237

## 2019-03-01 MED ORDER — POTASSIUM CHLORIDE CRYS ER 20 MEQ PO TBCR
20.0000 meq | EXTENDED_RELEASE_TABLET | Freq: Once | ORAL | Status: AC
  Administered 2019-03-01: 20 meq via ORAL
  Filled 2019-03-01: qty 2

## 2019-03-01 MED ORDER — FERROUS SULFATE 325 (65 FE) MG PO TABS
325.0000 mg | ORAL_TABLET | Freq: Every day | ORAL | Status: DC
  Administered 2019-03-04 – 2019-03-18 (×16): 325 mg via ORAL
  Filled 2019-03-01 (×15): qty 1

## 2019-03-01 MED ORDER — AMLODIPINE BESYLATE 5 MG PO TABS
2.5000 mg | ORAL_TABLET | Freq: Every day | ORAL | Status: DC
  Administered 2019-03-01 – 2019-03-17 (×17): 2.5 mg via ORAL
  Filled 2019-03-01 (×18): qty 1

## 2019-03-01 MED ORDER — ADULT MULTIVITAMIN W/MINERALS CH
1.0000 | ORAL_TABLET | Freq: Every day | ORAL | Status: DC
  Administered 2019-03-02 – 2019-03-18 (×16): 1 via ORAL
  Filled 2019-03-01 (×17): qty 1

## 2019-03-01 MED ORDER — BENZONATATE 100 MG PO CAPS: 100.0000 mg | ORAL_CAPSULE | Freq: Three times a day (TID) | ORAL | Status: DC | PRN

## 2019-03-01 MED ORDER — HEPARIN BOLUS VIA INFUSION
3500.0000 [IU] | Freq: Once | INTRAVENOUS | Status: AC
  Administered 2019-03-01: 3500 [IU] via INTRAVENOUS
  Filled 2019-03-01: qty 3500

## 2019-03-01 MED ORDER — ACETAMINOPHEN 325 MG RE SUPP
325.0000 mg | RECTAL | Status: DC | PRN
  Filled 2019-03-01: qty 2

## 2019-03-01 MED ORDER — PHENOL 1.4 % MT LIQD
1.0000 | OROMUCOSAL | Status: DC | PRN
  Filled 2019-03-01: qty 177

## 2019-03-01 MED ORDER — ALLOPURINOL 100 MG PO TABS
100.0000 mg | ORAL_TABLET | Freq: Every day | ORAL | Status: DC
  Administered 2019-03-02 – 2019-03-18 (×16): 100 mg via ORAL
  Filled 2019-03-01 (×17): qty 1

## 2019-03-01 MED ORDER — MORPHINE SULFATE (PF) 2 MG/ML IV SOLN
2.0000 mg | INTRAVENOUS | Status: DC | PRN
  Administered 2019-03-02: 4 mg via INTRAVENOUS
  Filled 2019-03-01: qty 2

## 2019-03-01 MED ORDER — MESALAMINE ER 250 MG PO CPCR
2000.0000 mg | ORAL_CAPSULE | Freq: Two times a day (BID) | ORAL | Status: DC
  Administered 2019-03-01 – 2019-03-18 (×32): 2000 mg via ORAL
  Filled 2019-03-01 (×37): qty 8

## 2019-03-01 MED ORDER — BRIMONIDINE TARTRATE 0.2 % OP SOLN
1.0000 [drp] | Freq: Two times a day (BID) | OPHTHALMIC | Status: DC
  Administered 2019-03-01 – 2019-03-18 (×32): 1 [drp] via OPHTHALMIC
  Filled 2019-03-01: qty 5

## 2019-03-01 MED ORDER — DOCUSATE SODIUM 100 MG PO CAPS
100.0000 mg | ORAL_CAPSULE | Freq: Two times a day (BID) | ORAL | Status: DC
  Administered 2019-03-01 – 2019-03-17 (×22): 100 mg via ORAL
  Filled 2019-03-01 (×28): qty 1

## 2019-03-01 MED ORDER — CYCLOBENZAPRINE HCL 10 MG PO TABS
10.0000 mg | ORAL_TABLET | Freq: Every day | ORAL | Status: DC | PRN
  Administered 2019-03-03 – 2019-03-15 (×3): 10 mg via ORAL
  Filled 2019-03-01 (×3): qty 1

## 2019-03-01 MED ORDER — ENOXAPARIN SODIUM 40 MG/0.4ML ~~LOC~~ SOLN
40.0000 mg | SUBCUTANEOUS | Status: DC
  Administered 2019-03-01 – 2019-03-02 (×2): 40 mg via SUBCUTANEOUS
  Filled 2019-03-01 (×2): qty 0.4

## 2019-03-01 MED ORDER — SODIUM CHLORIDE 0.9 % IV SOLN
INTRAVENOUS | Status: DC
  Administered 2019-03-01 – 2019-03-03 (×2): via INTRAVENOUS

## 2019-03-01 MED ORDER — VITAMIN B-12 1000 MCG PO TABS
2500.0000 ug | ORAL_TABLET | Freq: Every day | ORAL | Status: DC
  Administered 2019-03-02 – 2019-03-18 (×16): 2500 ug via ORAL
  Filled 2019-03-01 (×16): qty 3

## 2019-03-01 MED ORDER — HEPARIN (PORCINE) 25000 UT/250ML-% IV SOLN
1050.0000 [IU]/h | INTRAVENOUS | Status: DC
  Administered 2019-03-01: 1050 [IU]/h via INTRAVENOUS
  Filled 2019-03-01: qty 250

## 2019-03-01 MED ORDER — ONDANSETRON HCL 4 MG/2ML IJ SOLN
4.0000 mg | Freq: Four times a day (QID) | INTRAMUSCULAR | Status: DC | PRN
  Administered 2019-03-03 – 2019-03-04 (×2): 4 mg via INTRAVENOUS
  Filled 2019-03-01: qty 2

## 2019-03-01 MED ORDER — CEPHALEXIN 500 MG PO CAPS
500.0000 mg | ORAL_CAPSULE | Freq: Three times a day (TID) | ORAL | Status: DC
  Administered 2019-03-01 – 2019-03-03 (×5): 500 mg via ORAL
  Filled 2019-03-01 (×5): qty 1

## 2019-03-01 MED ORDER — PREDNISONE 5 MG PO TABS
22.5000 mg | ORAL_TABLET | Freq: Every day | ORAL | Status: DC
  Administered 2019-03-04 – 2019-03-18 (×15): 22.5 mg via ORAL
  Filled 2019-03-01 (×18): qty 1

## 2019-03-01 MED ORDER — ALBUTEROL SULFATE (2.5 MG/3ML) 0.083% IN NEBU: 2.5000 mg | INHALATION_SOLUTION | RESPIRATORY_TRACT | Status: DC | PRN

## 2019-03-01 MED ORDER — ASPIRIN EC 81 MG PO TBEC
81.0000 mg | DELAYED_RELEASE_TABLET | Freq: Every day | ORAL | Status: DC
  Administered 2019-03-02 – 2019-03-17 (×16): 81 mg via ORAL
  Filled 2019-03-01 (×18): qty 1

## 2019-03-01 MED ORDER — PRAVASTATIN SODIUM 10 MG PO TABS
10.0000 mg | ORAL_TABLET | Freq: Every evening | ORAL | Status: DC
  Administered 2019-03-01 – 2019-03-17 (×17): 10 mg via ORAL
  Filled 2019-03-01 (×17): qty 1

## 2019-03-01 MED ORDER — ALUM & MAG HYDROXIDE-SIMETH 200-200-20 MG/5ML PO SUSP: 15.0000 mL | ORAL | Status: DC | PRN

## 2019-03-01 MED ORDER — OXYCODONE HCL 5 MG PO TABS
5.0000 mg | ORAL_TABLET | ORAL | Status: DC | PRN
  Administered 2019-03-01: 5 mg via ORAL
  Administered 2019-03-03 (×2): 10 mg via ORAL
  Administered 2019-03-03: 5 mg via ORAL
  Administered 2019-03-04 (×2): 10 mg via ORAL
  Administered 2019-03-05 – 2019-03-10 (×3): 5 mg via ORAL
  Filled 2019-03-01: qty 1
  Filled 2019-03-01: qty 2
  Filled 2019-03-01: qty 1
  Filled 2019-03-01: qty 2
  Filled 2019-03-01: qty 1
  Filled 2019-03-01 (×4): qty 2
  Filled 2019-03-01: qty 1

## 2019-03-01 MED ORDER — LATANOPROST 0.005 % OP SOLN
1.0000 [drp] | Freq: Every day | OPHTHALMIC | Status: DC
  Administered 2019-03-01 – 2019-03-17 (×17): 1 [drp] via OPHTHALMIC
  Filled 2019-03-01: qty 2.5

## 2019-03-01 MED ORDER — PANTOPRAZOLE SODIUM 40 MG PO TBEC: 40.0000 mg | DELAYED_RELEASE_TABLET | Freq: Every day | ORAL | Status: DC

## 2019-03-01 MED ORDER — TIMOLOL MALEATE 0.5 % OP SOLN
1.0000 [drp] | Freq: Two times a day (BID) | OPHTHALMIC | Status: DC
  Administered 2019-03-01 – 2019-03-18 (×32): 1 [drp] via OPHTHALMIC
  Filled 2019-03-01: qty 5

## 2019-03-01 MED ORDER — METOPROLOL TARTRATE 5 MG/5ML IV SOLN: 2.0000 mg | INTRAVENOUS | Status: DC | PRN

## 2019-03-01 NOTE — ED Notes (Signed)
Meal tray at bedside.  

## 2019-03-01 NOTE — Progress Notes (Signed)
ANTICOAGULATION CONSULT NOTE - Initial Consult  Pharmacy Consult:  Heparin Indication:  PAD/necrotic toes  Allergies  Allergen Reactions  . Ciprofloxacin Other (See Comments)    tendons hurt  . Shrimp [Shellfish Allergy]     Patient Measurements: Height: 5' 7"  (170.2 cm) Weight: 163 lb (73.9 kg) IBW/kg (Calculated) : 66.1 Heparin Dosing Weight: 74 kg  Vital Signs: Temp: 97.5 F (36.4 C) (08/24 1114) Temp Source: Oral (08/24 1114) BP: 134/79 (08/24 1115) Pulse Rate: 76 (08/24 1123)  Labs: Recent Labs    03/01/19 1158  HGB 12.1*  HCT 39.6  PLT 358    Estimated Creatinine Clearance: 73.4 mL/min (by C-G formula based on SCr of 0.79 mg/dL).   Medical History: Past Medical History:  Diagnosis Date  . Bacteremia due to Escherichia coli 11/2013  . Crohn disease ([place])   . Diabetes mellitus without complication ([place])   . DM (diabetes mellitus), type 2 with peripheral vascular complications ([place]) 5/90/9311  . DVT (deep venous thrombosis) ([place]) 11/27/13  . History of skin cancer    ARMS & FACE  . Peripheral arterial disease ([place]) 02/15/2019  . Pulmonary embolism ([place]) 11/27/13      Assessment: 47 YOM sent to the ED for evaluation of possible left foot infection.  Pharmacy consulted to dose IV heparin for PAD/necrosis of toes.  Home meds and baseline labs reviewed.  Goal of Therapy:  Heparin level 0.3-0.7 units/ml Monitor platelets by anticoagulation protocol: Yes   Plan:  Heparin 3500 units IV bolus, then Heparin gtt at 1050 units/hr Check 8 hr heparin level Daily heparin level and CBC  Nijah Orlich D. Mina Marble, PharmD, BCPS, [place] 03/01/2019, 12:21 PM

## 2019-03-01 NOTE — ED Notes (Signed)
Dr. Oneida Alar at bedside

## 2019-03-01 NOTE — ED Triage Notes (Signed)
Pt send by vascular center for evlaution of possible infection in L foot; pt recently discharged after hospital stay for same; hx dm; denies pain; all toes on L foot black, foot cool to touch; small sore noted to R third toe; per wife, foul odor to foot stated this past weekend; pt followed by Dr. Oneida Alar of vascular sx, who requested to be notified on pt arrival

## 2019-03-01 NOTE — ED Notes (Signed)
ED TO INPATIENT HANDOFF REPORT  ED Nurse Name and Phone #: Davene Costain [phone]   S Name/Age/Gender Rodney Sullivan 78 y.o. male Room/Bed: 031C/031C  Code Status   Code Status: Full Code  Home/SNF/Other Home Patient oriented to: self, place, time and situation Is this baseline? Yes   Triage Complete: Triage complete  Chief Complaint INFECTION  Triage Note Pt send by vascular center for evlaution of possible infection in L foot; pt recently discharged after hospital stay for same; hx dm; denies pain; all toes on L foot black, foot cool to touch; small sore noted to R third toe; per wife, foul odor to foot stated this past weekend; pt followed by Dr. Oneida Alar of vascular sx, who requested to be notified on pt arrival   Allergies Allergies  Allergen Reactions  . Ciprofloxacin Other (See Comments)    tendons hurt  . Shrimp [Shellfish Allergy]     Level of Care/Admitting Diagnosis ED Disposition    ED Disposition Condition [facility] Area: [place] [100100]  Level of Care: Telemetry Surgical [105]  Covid Evaluation: Asymptomatic Screening Protocol (No Symptoms)  Diagnosis: Gangrene of left foot [facility]) [[id number]]  Admitting Physician: Darlin Drop  Attending Physician: Elam Dutch [phone]  Estimated length of stay: 3 - 4 days  Certification:: I certify this patient will need inpatient services for at least 2 midnights  Bed request comments: 4e  PT Class (Do Not Modify): Inpatient [101]  PT Acc Code (Do Not Modify): Private [1]       B Medical/Surgery History Past Medical History:  Diagnosis Date  . Bacteremia due to Escherichia coli 11/2013  . Crohn disease ([place])   . Diabetes mellitus without complication ([place])   . DM (diabetes mellitus), type 2 with peripheral vascular complications ([place]) 3/33/5456  . DVT (deep venous thrombosis) ([place]) 11/27/13  . History of skin cancer    ARMS & FACE  . Peripheral arterial disease ([place])  02/15/2019  . Pulmonary embolism ([place]) 11/27/13    Past Surgical History:  Procedure Laterality Date  . ABDOMINAL AORTOGRAM N/A 12/14/2018   Procedure: ABDOMINAL AORTOGRAM;  Surgeon: Waynetta Sandy, MD;  Location: [place] [facility];  Service: Cardiovascular;  Laterality: N/A;  . ABDOMINAL AORTOGRAM W/LOWER EXTREMITY Bilateral 02/17/2019   Procedure: ABDOMINAL AORTOGRAM W/LOWER EXTREMITY;  Surgeon: [name], MD;  Location: [place] [facility];  Service: Cardiovascular;  Laterality: Bilateral;  . ABDOMINAL SURGERY     Bowel resection x2  . AMPUTATION Right 12/21/2018   Procedure: RIGHT LONG FINGER AMPUTATION;  Surgeon: Leanora Cover, MD;  Location: [place];  Service: Orthopedics;  Laterality: Right;  . CHOLECYSTECTOMY    . COLON SURGERY    . HIP SURGERY     BOTH  . JOINT REPLACEMENT    . LOWER EXTREMITY ANGIOGRAPHY Right 12/14/2018   Procedure: LOWER EXTREMITY ANGIOGRAPHY;  Surgeon: Waynetta Sandy, MD;  Location: [place] [facility];  Service: Cardiovascular;  Laterality: Right;  . PERIPHERAL VASCULAR ATHERECTOMY  02/17/2019   Procedure: PERIPHERAL VASCULAR ATHERECTOMY;  Surgeon: [name], MD;  Location: [place] [facility];  Service: Cardiovascular;;  . SHOULDER SURGERY     RIGHT TENDON DETACHED  . UPPER EXTREMITY ANGIOGRAPHY Right 12/14/2018   Procedure: Right UPPER EXTREMITY ANGIOGRAPHY;  Surgeon: Waynetta Sandy, MD;  Location: [place] [facility];  Service: Cardiovascular;  Laterality: Right;     A IV Location/Drains/Wounds Patient Lines/Drains/Airways Status  Active Line/Drains/Airways    Name:   Placement date:   Placement time:   Site:   Days:   Peripheral IV 03/01/19 Right Forearm   03/01/19    1202    Forearm   less than 1   Incision (Closed) 12/21/18 Hand Right   12/21/18    1436     70          Intake/Output Last 24 hours  Intake/Output Summary (Last 24 hours) at 03/01/2019 1739 Last data filed at  03/01/2019 1306 Gross per 24 hour  Intake 6.3 ml  Output -  Net 6.3 ml    Labs/Imaging Results for orders placed or performed during the hospital encounter of 03/01/19 (from the past 48 hour(s))  Comprehensive metabolic panel     Status: Abnormal   Collection Time: 03/01/19 11:58 AM  Result Value Ref Range   Sodium 140 135 - 145 mmol/L   Potassium 3.8 3.5 - 5.1 mmol/L   Chloride 106 98 - 111 mmol/L   CO2 21 (L) 22 - 32 mmol/L   Glucose, Bld 198 (H) 70 - 99 mg/dL   BUN 13 8 - 23 mg/dL   Creatinine, Ser 0.98 0.61 - 1.24 mg/dL   Calcium 8.8 (L) 8.9 - 10.3 mg/dL   Total Protein 5.5 (L) 6.5 - 8.1 g/dL   Albumin 2.9 (L) 3.5 - 5.0 g/dL   AST 21 15 - 41 U/L   ALT 31 0 - 44 U/L   Alkaline Phosphatase 51 38 - 126 U/L   Total Bilirubin 0.6 0.3 - 1.2 mg/dL   GFR calc non Af Amer >60 >60 mL/min   GFR calc Af Amer >60 >60 mL/min   Anion gap 13 5 - 15    Comment: Performed at [facility], [address], [place], [place] [address]  CBC with Differential     Status: Abnormal   Collection Time: 03/01/19 11:58 AM  Result Value Ref Range   WBC 12.6 (H) 4.0 - 10.5 K/uL   RBC 4.27 4.22 - 5.81 MIL/uL   Hemoglobin 12.1 (L) 13.0 - 17.0 g/dL   HCT 39.6 39.0 - 52.0 %   MCV 92.7 80.0 - 100.0 fL   MCH 28.3 26.0 - 34.0 pg   MCHC 30.6 30.0 - 36.0 g/dL   RDW 14.1 11.5 - 15.5 %   Platelets 358 150 - 400 K/uL   nRBC 0.2 0.0 - 0.2 %   Neutrophils Relative % 68 %   Neutro Abs 8.5 (H) 1.7 - 7.7 K/uL   Lymphocytes Relative 17 %   Lymphs Abs 2.1 0.7 - 4.0 K/uL   Monocytes Relative 12 %   Monocytes Absolute 1.6 (H) 0.1 - 1.0 K/uL   Eosinophils Relative 1 %   Eosinophils Absolute 0.2 0.0 - 0.5 K/uL   Basophils Relative 0 %   Basophils Absolute 0.1 0.0 - 0.1 K/uL   Immature Granulocytes 2 %   Abs Immature Granulocytes 0.21 (H) 0.00 - 0.07 K/uL    Comment: Performed at [facility] [address] [place], [place] [address]  Lactic acid, plasma     Status: Abnormal   Collection Time:  03/01/19 11:58 AM  Result Value Ref Range   Lactic Acid, Venous 3.9 (HH) 0.5 - 1.9 mmol/L    Comment: CRITICAL RESULT CALLED TO, READ BACK BY AND VERIFIED WITHDomenica Fail [phone] [id number] [facility] Performed at [facility], [place] [address], [place], [place] [address]   Lactic  acid, plasma     Status: Abnormal   Collection Time: 03/01/19  1:59 PM  Result Value Ref Range   Lactic Acid, Venous 4.7 (HH) 0.5 - 1.9 mmol/L    Comment: CRITICAL RESULT CALLED TO, READ BACK BY AND VERIFIED WITH: H.Rayen Palen,RN 1441 03/01/2019 CL[place],S Performed at [facility], [place] [address] [place], [place] [address]   CBG monitoring, ED     Status: Abnormal   Collection Time: 03/01/19  2:24 PM  Result Value Ref Range   Glucose-Capillary 228 (H) 70 - 99 mg/dL  Basic metabolic panel     Status: Abnormal   Collection Time: 03/01/19  3:10 PM  Result Value Ref Range   Sodium 140 135 - 145 mmol/L   Potassium 4.1 3.5 - 5.1 mmol/L   Chloride 104 98 - 111 mmol/L   CO2 23 22 - 32 mmol/L   Glucose, Bld 239 (H) 70 - 99 mg/dL   BUN 12 8 - 23 mg/dL   Creatinine, Ser 1.02 0.61 - 1.24 mg/dL   Calcium 8.6 (L) 8.9 - 10.3 mg/dL   GFR calc non Af Amer >60 >60 mL/min   GFR calc Af Amer >60 >60 mL/min   Anion gap 13 5 - 15    Comment: Performed at [facility], [place] [address] [place], [place] [address]   Dg Foot Complete Left  Result Date: 03/01/2019 CLINICAL DATA:  78 year old male with increasing necrosis of toes on the left foot. EXAM: LEFT FOOT - COMPLETE 3+ VIEW COMPARISON:  02/15/2019 left foot series. FINDINGS: Extensive calcified peripheral vascular disease again noted. Bone mineralization in the left foot and toes appears stable from earlier this month. Stable joint spaces and alignment. No soft tissue gas identified. But there is increased soft tissue swelling of the distal foot. No fracture or osteolysis identified. Pes planus re-demonstrated. Grossly intact distal tibia and fibula.  IMPRESSION: 1. Increased soft tissue swelling of the distal foot but no soft tissue gas or plain radiographic evidence of osteomyelitis. 2. Severe calcified peripheral vascular disease. Electronically Signed   By: Genevie Ann M.D.   On: 03/01/2019 12:41    Pending Labs Unresulted Labs (From admission, onward)    Start     Ordered   03/08/19 0500  Creatinine, serum  (enoxaparin (LOVENOX)    CrCl >/= 30 ml/min)  Weekly,   R    Comments: while on enoxaparin therapy    03/01/19 1353   03/02/19 0500  CBC  Tomorrow morning,   R     03/01/19 1353   03/02/19 0500  Hemoglobin A1c  Tomorrow morning,   R    Comments: To assess prior glycemic control    03/01/19 1402   03/01/19 1555  CBC  Once,   R     03/01/19 1555   03/01/19 1220  Novel Coronavirus, NAA (hospital order; send-out to ref lab)  (Symptomatic/High Risk of Exposure/Tier 1 Patients Labs with Precautions)  Once,   STAT    Question Answer Comment  Is this test for diagnosis or screening Screening   Symptomatic for COVID-19 as defined by CDC No   Hospitalized for COVID-19 No   Admitted to ICU for COVID-19 No   Previously tested for COVID-19 Yes   Resident in a congregate (group) care setting No   Employed in healthcare setting No      03/01/19 1219          Vitals/Pain Today's Vitals   03/01/19 1445 03/01/19 1515  03/01/19 1625 03/01/19 1625  BP: 129/76 119/60  136/72  Pulse: 74   81  Resp: 14 12  17   Temp:      TempSrc:      SpO2: 97%   97%  Weight:      Height:      PainSc:   0-No pain     Isolation Precautions No active isolations  Medications Medications  ondansetron (ZOFRAN) injection 4 mg (has no administration in time range)  alum & mag hydroxide-simeth (MAALOX/MYLANTA) 200-200-20 MG/5ML suspension 15-30 mL (has no administration in time range)  pantoprazole (PROTONIX) EC tablet 40 mg (40 mg Oral Given 03/01/19 1440)  labetalol (NORMODYNE) injection 10 mg (has no administration in time range)  hydrALAZINE  (APRESOLINE) injection 5 mg (has no administration in time range)  metoprolol tartrate (LOPRESSOR) injection 2-5 mg (has no administration in time range)  guaiFENesin-dextromethorphan (ROBITUSSIN DM) 100-10 MG/5ML syrup 15 mL (has no administration in time range)  phenol (CHLORASEPTIC) mouth spray 1 spray (has no administration in time range)  enoxaparin (LOVENOX) injection 40 mg (has no administration in time range)  0.9 %  sodium chloride infusion ( Intravenous New Bag/Given 03/01/19 1445)  acetaminophen (TYLENOL) tablet 325-650 mg (has no administration in time range)    Or  acetaminophen (TYLENOL) suppository 325-650 mg (has no administration in time range)  oxyCODONE (Oxy IR/ROXICODONE) immediate release tablet 5-10 mg (has no administration in time range)  morphine 2 MG/ML injection 2-5 mg (has no administration in time range)  docusate sodium (COLACE) capsule 100 mg (100 mg Oral Given 03/01/19 1441)  insulin aspart (novoLOG) injection 0-15 Units (5 Units Subcutaneous Given 03/01/19 1442)  feeding supplement (GLUCERNA SHAKE) (GLUCERNA SHAKE) liquid 237 mL (has no administration in time range)  mesalamine (PENTASA) CR capsule 2,000 mg (has no administration in time range)  allopurinol (ZYLOPRIM) tablet 100 mg (has no administration in time range)  diphenhydrAMINE (BENADRYL) tablet 25 mg (has no administration in time range)  multivitamin with minerals tablet 1 tablet (has no administration in time range)  aspirin EC tablet 81 mg (has no administration in time range)  latanoprost (XALATAN) 0.005 % ophthalmic solution 1 drop (has no administration in time range)  amLODipine (NORVASC) tablet 2.5 mg (has no administration in time range)  albuterol (VENTOLIN HFA) 108 (90 Base) MCG/ACT inhaler 2 puff (has no administration in time range)  predniSONE (DELTASONE) tablet 22.5 mg (has no administration in time range)  brimonidine-timolol (COMBIGAN) 0.2-0.5 % ophthalmic solution 1 drop (has no  administration in time range)  B-12 TABS 2,500 mcg (has no administration in time range)  ferrous sulfate tablet 325 mg (has no administration in time range)  benzonatate (TESSALON) capsule 100 mg (has no administration in time range)  cyclobenzaprine (FLEXERIL) tablet 10 mg (has no administration in time range)  pioglitazone (ACTOS) tablet 15 mg (has no administration in time range)  clopidogrel (PLAVIX) tablet 75 mg (has no administration in time range)  cephALEXin (KEFLEX) capsule 500 mg (has no administration in time range)  pravastatin (PRAVACHOL) tablet 10 mg (has no administration in time range)  heparin bolus via infusion 3,500 Units (3,500 Units Intravenous Bolus from Bag 03/01/19 1230)  potassium chloride SA (K-DUR) CR tablet 20-40 mEq (20 mEq Oral Given 03/01/19 1440)    Mobility walks with device Moderate fall risk   Focused Assessments    R Recommendations: See Admitting Provider Note  Report given to:   Additional Notes:

## 2019-03-01 NOTE — ED Notes (Signed)
Carb modified dinner tray ordered

## 2019-03-01 NOTE — H&P (Signed)
Patient name: Rodney Sullivan MRN: [id number] DOB: 14-Aug-1940 Sex: male  REASON FOR CONSULT: Gangrene left foot  HPI: Rodney Sullivan is a 78 y.o. male, deviously seen by Dr. Donzetta Matters several weeks ago.  At that point he had a wound on his right foot.  Abdominal aortogram lower extremity runoff was performed and showed inline flow via the posterior tibial artery.  He then subsequently had an intervention on his left posterior tibial artery by Dr. Carlis Abbott about 2 weeks after that.  Patient had fairly significant wound on his left foot at that point.  Despite revascularization of his left lower extremity the left foot has become worse with foul smell and more darkened discoloration.  The patient does not really have any pain in the foot but has significant neuropathy.  Other medical problems include diabetes and Crohn's disease.  Both of which are currently stable.  Patient is on Plavix aspirin and a statin.  Past Medical History:  Diagnosis Date  . Bacteremia due to Escherichia coli 11/2013  . Crohn disease ([place])   . Diabetes mellitus without complication ([place])   . DM (diabetes mellitus), type 2 with peripheral vascular complications ([place]) 12/03/4130  . DVT (deep venous thrombosis) ([place]) 11/27/13  . History of skin cancer    [facility]  . Peripheral arterial disease ([place]) 02/15/2019  . Pulmonary embolism ([place]) 11/27/13    Past Surgical History:  Procedure Laterality Date  . ABDOMINAL AORTOGRAM N/A 12/14/2018   Procedure: ABDOMINAL AORTOGRAM;  Surgeon: Waynetta Sandy, MD;  Location: [facility];  Service: Cardiovascular;  Laterality: N/A;  . ABDOMINAL AORTOGRAM W/LOWER EXTREMITY Bilateral 02/17/2019   Procedure: ABDOMINAL AORTOGRAM W/LOWER EXTREMITY;  Surgeon: [name], MD;  Location: [facility];  Service: Cardiovascular;  Laterality: Bilateral;  . ABDOMINAL SURGERY     Bowel resection x2  . AMPUTATION Right 12/21/2018   Procedure: RIGHT LONG FINGER AMPUTATION;   Surgeon: Leanora Cover, MD;  Location: [facility];  Service: Orthopedics;  Laterality: Right;  . CHOLECYSTECTOMY    . COLON SURGERY    . HIP SURGERY     BOTH  . JOINT REPLACEMENT    . LOWER EXTREMITY ANGIOGRAPHY Right 12/14/2018   Procedure: LOWER EXTREMITY ANGIOGRAPHY;  Surgeon: Waynetta Sandy, MD;  Location: [facility];  Service: Cardiovascular;  Laterality: Right;  . PERIPHERAL VASCULAR ATHERECTOMY  02/17/2019   Procedure: PERIPHERAL VASCULAR ATHERECTOMY;  Surgeon: [name], MD;  Location: [facility];  Service: Cardiovascular;;  . SHOULDER SURGERY     RIGHT TENDON DETACHED  . UPPER EXTREMITY ANGIOGRAPHY Right 12/14/2018   Procedure: Right UPPER EXTREMITY ANGIOGRAPHY;  Surgeon: Waynetta Sandy, MD;  Location: [facility];  Service: Cardiovascular;  Laterality: Right;    Family History  Problem Relation Age of Onset  . Hypertension Mother     SOCIAL HISTORY: Social History   Socioeconomic History  . Marital status: Married    Spouse name: Not on file  . Number of children: Not on file  . Years of education: Not on file  . Highest education level: Not on file  Occupational History  . Not on file  Social Needs  . Financial resource strain: Not on file  . Food insecurity    Worry: Not on file    Inability: Not on file  . Transportation needs    Medical: Not on file    Non-medical: Not on file  Tobacco Use  .  Smoking status: Former Smoker    Years: 10.00    Types: Pipe  . Smokeless tobacco: Former Systems developer    Quit date: 12/08/1979  Substance and Sexual Activity  . Alcohol use: No    Alcohol/week: 0.0 standard drinks  . Drug use: No  . Sexual activity: Not Currently  Lifestyle  . Physical activity    Days per week: Not on file    Minutes per session: Not on file  . Stress: Not on file  Relationships  . Social Herbalist on phone: Not on file    Gets together: Not on file    Attends religious  service: Not on file    Active member of club or organization: Not on file    Attends meetings of clubs or organizations: Not on file    Relationship status: Not on file  . Intimate partner violence    Fear of current or ex partner: Not on file    Emotionally abused: Not on file    Physically abused: Not on file    Forced sexual activity: Not on file  Other Topics Concern  . Not on file  Social History Narrative  . Not on file    Allergies  Allergen Reactions  . Ciprofloxacin Other (See Comments)    tendons hurt  . Shrimp [Shellfish Allergy]     Current Facility-Administered Medications  Medication Dose Route Frequency Provider Last Rate Last Dose  . 0.9 %  sodium chloride infusion   Intravenous Continuous Elam Dutch, MD      . acetaminophen (TYLENOL) tablet 325-650 mg  325-650 mg Oral Q4H PRN Elam Dutch, MD       Or  . acetaminophen (TYLENOL) suppository 325-650 mg  325-650 mg Rectal Q4H PRN Elam Dutch, MD      . alum & mag hydroxide-simeth (MAALOX/MYLANTA) 200-200-20 MG/5ML suspension 15-30 mL  15-30 mL Oral Q2H PRN Elam Dutch, MD      . docusate sodium (COLACE) capsule 100 mg  100 mg Oral BID Elam Dutch, MD      . enoxaparin (LOVENOX) injection 40 mg  40 mg Subcutaneous Q24H , Jessy Oto, MD      . guaiFENesin-dextromethorphan (ROBITUSSIN DM) 100-10 MG/5ML syrup 15 mL  15 mL Oral Q4H PRN Elam Dutch, MD      . hydrALAZINE (APRESOLINE) injection 5 mg  5 mg Intravenous Q20 Min PRN Elam Dutch, MD      . labetalol (NORMODYNE) injection 10 mg  10 mg Intravenous Q10 min PRN Elam Dutch, MD      . metoprolol tartrate (LOPRESSOR) injection 2-5 mg  2-5 mg Intravenous Q2H PRN Elam Dutch, MD      . morphine 2 MG/ML injection 2-5 mg  2-5 mg Intravenous Q1H PRN Elam Dutch, MD      . ondansetron (ZOFRAN) injection 4 mg  4 mg Intravenous Q6H PRN Elam Dutch, MD      . oxyCODONE (Oxy IR/ROXICODONE) immediate  release tablet 5-10 mg  5-10 mg Oral Q4H PRN Elam Dutch, MD      . pantoprazole (PROTONIX) EC tablet 40 mg  40 mg Oral Daily ,  E, MD      . phenol (CHLORASEPTIC) mouth spray 1 spray  1 spray Mouth/Throat PRN Elam Dutch, MD      . potassium chloride SA (K-DUR) CR tablet 20-40 mEq  20-40 mEq Oral Once Elam Dutch, MD  Current Outpatient Medications  Medication Sig Dispense Refill  . albuterol (VENTOLIN HFA) 108 (90 Base) MCG/ACT inhaler Inhale 2 puffs into the lungs every 4 (four) hours as needed for wheezing or shortness of breath.     . allopurinol (ZYLOPRIM) 100 MG tablet Take 100 mg by mouth daily.    Marland Kitchen amLODipine (NORVASC) 2.5 MG tablet Take 2.5 mg by mouth daily with supper.     Marland Kitchen aspirin EC 81 MG tablet Take 81 mg by mouth daily with supper.     . benzonatate (TESSALON) 100 MG capsule Take 100 mg by mouth 3 (three) times daily as needed for cough.    . brimonidine-timolol (COMBIGAN) 0.2-0.5 % ophthalmic solution Place 1 drop into both eyes 2 (two) times a day.    . cephALEXin (KEFLEX) 500 MG capsule Take 1 capsule (500 mg total) by mouth every 8 (eight) hours for 10 days. 30 capsule 0  . Certolizumab Pegol (CIMZIA) 2 X 200 MG KIT Inject 400 mg into the skin every 28 (twenty-eight) days.    . Cholecalciferol (VITAMIN D) 50 MCG (2000 UT) tablet Take 2,000 Units by mouth daily.    . clopidogrel (PLAVIX) 75 MG tablet Take 1 tablet (75 mg total) by mouth daily with breakfast. 30 tablet 0  . clotrimazole-betamethasone (LOTRISONE) cream Apply 1 application topically 2 (two) times daily as needed (fungus). On the side of mouth    . Cyanocobalamin (B-12) 2500 MCG TABS Take 2,500 mcg by mouth daily.    . cyclobenzaprine (FLEXERIL) 10 MG tablet Take 10 mg by mouth daily as needed for muscle spasms.    . Dexamethasone Acetate 8 MG/ML SUSP Inject 8 mg into the muscle daily as needed (for Crohn's flare).    . diphenhydrAMINE (BENADRYL) 25 MG tablet Take 25 mg by  mouth 2 (two) times daily as needed for allergies or sleep.     Marland Kitchen Emollient (ZIMS CRACK CREME) CREA Apply 1 application topically daily as needed (dry skin).     . famciclovir (FAMVIR) 250 MG tablet Take 250 mg by mouth daily as needed (fungus on corner of mouth flare).     . ferrous sulfate 325 (65 FE) MG tablet Take 325 mg by mouth daily.    Marland Kitchen ketoconazole (NIZORAL) 200 MG tablet Take 200 mg by mouth daily as needed (yeast infection).    Marland Kitchen latanoprost (XALATAN) 0.005 % ophthalmic solution Place 1 drop into both eyes at bedtime.    . mesalamine (PENTASA) 500 MG CR capsule Take 2,000 mg by mouth 2 (two) times daily.    . Multiple Vitamin (MULTIVITAMIN WITH MINERALS) TABS Take 1 tablet by mouth daily.    . Omega-3 Fatty Acids (FISH OIL) 1000 MG CAPS Take 1,000 mg by mouth daily.    Marland Kitchen OVER THE COUNTER MEDICATION Take 5 mLs by mouth 2 (two) times daily. CBD oil drops    . pantoprazole (PROTONIX) 40 MG tablet Take 40 mg by mouth daily at 3 pm.     . pioglitazone (ACTOS) 15 MG tablet Take 15 mg by mouth daily.     . pravastatin (PRAVACHOL) 10 MG tablet Take 1 tablet (10 mg total) by mouth every evening. 30 tablet 0  . predniSONE (DELTASONE) 10 MG tablet Take 25 mg by mouth daily after breakfast.       ROS:   General:  No weight loss, Fever, chills  HEENT: No recent headaches, no nasal bleeding, no visual changes, no sore throat  Neurologic: No dizziness, blackouts, seizures.  No recent symptoms of stroke or mini- stroke. No recent episodes of slurred speech, or temporary blindness.  Cardiac: No recent episodes of chest pain/pressure, no shortness of breath at rest.  + shortness of breath with exertion.  Denies history of atrial fibrillation or irregular heartbeat  Vascular: No history of rest pain in feet.  No history of claudication.  + history of non-healing ulcer, No history of DVT   Pulmonary: No home oxygen, no productive cough, no hemoptysis,  No asthma or wheezing  Musculoskeletal:   _0  Arthritis, _1  Low back pain,  _2  Joint pain  Hematologic:No history of hypercoagulable state.  No history of easy bleeding.  No history of anemia  Gastrointestinal: No hematochezia or melena,  No gastroesophageal reflux, no trouble swallowing  Urinary: _3  chronic Kidney disease, _4  on HD - _5  MWF or _6  TTHS, _7  Burning with urination, _8  Frequent urination, _9  Difficulty urinating;   Skin: No rashes  Psychological: No history of anxiety,  No history of depression   Physical Examination  Vitals:   03/01/19 1123 03/01/19 1130 03/01/19 1200 03/01/19 1230  BP:  111/63 103/69 109/68  Pulse: 76 73 72 72  Resp: _10 Temp:      TempSrc:      SpO2: 98% 96% 96% 97%  Weight:      Height:        Body mass index is 25.53 kg/m.  General:  Alert and oriented, no acute distress HEENT: Normal Neck: No JVD Pulmonary: Clear to auscultation bilaterally Cardiac: Regular Rate and Rhythm  Skin: No rash, gangrene of all 5 toes left foot with erythema and coolness extending to the ankle level, 4 cm ulceration with necrotic eschar left lateral heel Extremity Pulses:  2+ radial, brachial, femoral, 2+ popliteal absent dorsalis pedis, posterior tibial pulses bilaterally Musculoskeletal: No deformity or edema  Neurologic: Upper and lower extremity motor 5/5 and symmetric  DATA:  CBC    Component Value Date/Time   WBC 12.6 (H) 03/01/2019 1158   RBC 4.27 03/01/2019 1158   HGB 12.1 (L) 03/01/2019 1158   HCT 39.6 03/01/2019 1158   PLT 358 03/01/2019 1158   MCV 92.7 03/01/2019 1158   MCH 28.3 03/01/2019 1158   MCHC 30.6 03/01/2019 1158   RDW 14.1 03/01/2019 1158   LYMPHSABS 2.1 03/01/2019 1158   MONOABS 1.6 (H) 03/01/2019 1158   EOSABS 0.2 03/01/2019 1158   BASOSABS 0.1 03/01/2019 1158    BMET    Component Value Date/Time   NA 140 03/01/2019 1158   K 3.8 03/01/2019 1158   CL 106 03/01/2019 1158   CO2 21 (L) 03/01/2019 1158   GLUCOSE 198 (H) 03/01/2019 1158   BUN 13  03/01/2019 1158   CREATININE 0.98 03/01/2019 1158   CALCIUM 8.8 (L) 03/01/2019 1158   GFRNONAA >60 03/01/2019 1158   GFRAA >60 03/01/2019 1158   Albumin 2.9  ASSESSMENT: Patient with gangrene left foot nonsalvageable at this point  Moderate to severe protein calorie malnutrition   PLAN: Patient will need a left below-knee amputation.  This will be scheduled for tomorrow.  N.p.o. after midnight.  Consent.  Patient will be placed on nutritional supplementation with his low serum albumin.   Ruta Hinds, MD [facility] Office: [phone] Pager: [phone]

## 2019-03-01 NOTE — ED Notes (Signed)
Dr. Oneida Alar notified of latest elevated lactic

## 2019-03-01 NOTE — ED Notes (Signed)
Pt given food and beverage

## 2019-03-01 NOTE — ED Provider Notes (Signed)
[facility] EMERGENCY DEPARTMENT Provider Note   CSN: [id number] Arrival date & time: 03/01/19  1109     History   Chief Complaint No chief complaint on file.   HPI Rodney Sullivan is a 78 y.o. male with PMHx PAD, DVT/PE, Diabetes, and Crohn's disease who presents to the ED today complaining of worsening redness as well as necrosis to his left foot.  Was recently admitted and then discharged from the hospital on 8/15 for same where he underwent left posterior tibial artery mechanical atherectomy and left posterior tibial artery angioplasty Dr. Oneida Alar with vascular surgery.  Patient was discharged home with 10 days of Keflex.  Wife reports he has 1 day left.  Reports that she has noticed a foul smell to his left foot for the past couple of days.  She has also been putting gauze in between the toes and notes that it has been draining pus.  He states that the redness has increased up his foot as well.-Called vascular surgery office this a.m. who was told to come here for further evaluation as Dr. Eden Lathe is on call currently.  Patient denies any pain currently.  Denies fever, chills, any other associated symptoms.       Past Medical History:  Diagnosis Date  . Bacteremia due to Escherichia coli 11/2013  . Crohn disease ([place])   . Diabetes mellitus without complication ([place])   . DM (diabetes mellitus), type 2 with peripheral vascular complications ([place]) 7/71/1657  . DVT (deep venous thrombosis) ([place]) 11/27/13  . History of skin cancer    [employer] [facility]  . Peripheral arterial disease ([place]) 02/15/2019  . Pulmonary embolism ([place]) 11/27/13     Patient Active Problem List   Diagnosis Date Noted  . Gangrene of left foot ([place]) 03/01/2019  . Sepsis ([place]) 02/15/2019  . Anemia 02/15/2019  . Cellulitis and abscess of toe of left foot 02/15/2019  . Peripheral arterial disease ([place]) 02/15/2019  . DM (diabetes mellitus), type 2 with peripheral vascular complications ([place]) 90/38/3338  .  Gout 02/15/2019  . Septic shock ([place]) 12/14/2013  . Pulmonary embolism ([place]) 12/14/2013  . Crohn disease ([place]) 12/14/2013    Past Surgical History:  Procedure Laterality Date  . ABDOMINAL AORTOGRAM N/A 12/14/2018   Procedure: ABDOMINAL AORTOGRAM;  Surgeon: Waynetta Sandy, MD;  Location: [facility];  Service: Cardiovascular;  Laterality: N/A;  . ABDOMINAL AORTOGRAM W/LOWER EXTREMITY Bilateral 02/17/2019   Procedure: ABDOMINAL AORTOGRAM W/LOWER EXTREMITY;  Surgeon: [name], MD;  Location: [facility];  Service: Cardiovascular;  Laterality: Bilateral;  . ABDOMINAL SURGERY     Bowel resection x2  . AMPUTATION Right 12/21/2018   Procedure: RIGHT LONG FINGER AMPUTATION;  Surgeon: Leanora Cover, MD;  Location: [facility];  Service: Orthopedics;  Laterality: Right;  . CHOLECYSTECTOMY    . COLON SURGERY    . HIP SURGERY     BOTH  . JOINT REPLACEMENT    . LOWER EXTREMITY ANGIOGRAPHY Right 12/14/2018   Procedure: LOWER EXTREMITY ANGIOGRAPHY;  Surgeon: Waynetta Sandy, MD;  Location: [facility];  Service: Cardiovascular;  Laterality: Right;  . PERIPHERAL VASCULAR ATHERECTOMY  02/17/2019   Procedure: PERIPHERAL VASCULAR ATHERECTOMY;  Surgeon: [name], MD;  Location: [facility];  Service: Cardiovascular;;  . SHOULDER SURGERY     RIGHT TENDON DETACHED  . UPPER EXTREMITY ANGIOGRAPHY Right 12/14/2018   Procedure: Right UPPER EXTREMITY ANGIOGRAPHY;  Surgeon: Waynetta Sandy, MD;  Location: [facility];  Service: Cardiovascular;  Laterality: Right;        Home Medications    Prior to Admission medications   Medication Sig Start Date End Date Taking? Authorizing Provider  albuterol (VENTOLIN HFA) 108 (90 Base) MCG/ACT inhaler Inhale 2 puffs into the lungs every 4 (four) hours as needed for wheezing or shortness of breath.  11/20/18   [provider]  allopurinol (ZYLOPRIM) 100 MG tablet Take  100 mg by mouth daily.    [provider]  amLODipine (NORVASC) 2.5 MG tablet Take 2.5 mg by mouth daily with supper.  11/18/18   [provider]  aspirin EC 81 MG tablet Take 81 mg by mouth daily with supper.     [provider]  benzonatate (TESSALON) 100 MG capsule Take 100 mg by mouth 3 (three) times daily as needed for cough.    [provider]  brimonidine-timolol (COMBIGAN) 0.2-0.5 % ophthalmic solution Place 1 drop into both eyes 2 (two) times a day.    [provider]  cephALEXin (KEFLEX) 500 MG capsule Take 1 capsule (500 mg total) by mouth every 8 (eight) hours for 10 days. 02/20/19 03/02/19  Arrien, Jimmy Picket, MD  Certolizumab Pegol (CIMZIA) 2 X 200 MG KIT Inject 400 mg into the skin every 28 (twenty-eight) days.    [provider]  Cholecalciferol (VITAMIN D) 50 MCG (2000 UT) tablet Take 2,000 Units by mouth daily.    [provider]  clopidogrel (PLAVIX) 75 MG tablet Take 1 tablet (75 mg total) by mouth daily with breakfast. 02/20/19 03/22/19  Arrien, Jimmy Picket, MD  clotrimazole-betamethasone (LOTRISONE) cream Apply 1 application topically 2 (two) times daily as needed (fungus). On the side of mouth 09/05/14   [provider]  Cyanocobalamin (B-12) 2500 MCG TABS Take 2,500 mcg by mouth daily.    [provider]  cyclobenzaprine (FLEXERIL) 10 MG tablet Take 10 mg by mouth daily as needed for muscle spasms.    [provider]  Dexamethasone Acetate 8 MG/ML SUSP Inject 8 mg into the muscle daily as needed (for Crohn's flare).    [provider]  diphenhydrAMINE (BENADRYL) 25 MG tablet Take 25 mg by mouth 2 (two) times daily as needed for allergies or sleep.     [provider]  Emollient (ZIMS CRACK CREME) CREA Apply 1 application topically daily as needed (dry skin).     [provider]  famciclovir (FAMVIR) 250 MG tablet Take 250 mg by mouth daily as needed (fungus on  corner of mouth flare).  11/18/18   [provider]  ferrous sulfate 325 (65 FE) MG tablet Take 325 mg by mouth daily.    [provider]  ketoconazole (NIZORAL) 200 MG tablet Take 200 mg by mouth daily as needed (yeast infection).    [provider]  latanoprost (XALATAN) 0.005 % ophthalmic solution Place 1 drop into both eyes at bedtime. 11/11/14   [provider]  mesalamine (PENTASA) 500 MG CR capsule Take 2,000 mg by mouth 2 (two) times daily.    [provider]  Multiple Vitamin (MULTIVITAMIN WITH MINERALS) TABS Take 1 tablet by mouth daily.    [provider]  Omega-3 Fatty Acids (FISH OIL) 1000 MG CAPS Take 1,000 mg by mouth daily.    [provider]  OVER THE COUNTER MEDICATION Take 5 mLs by mouth 2 (two) times daily. CBD oil drops    [provider]  pantoprazole (PROTONIX)  40 MG tablet Take 40 mg by mouth daily at 3 pm.     [provider]  pioglitazone (ACTOS) 15 MG tablet Take 15 mg by mouth daily.  01/13/19   [provider]  pravastatin (PRAVACHOL) 10 MG tablet Take 1 tablet (10 mg total) by mouth every evening. 02/20/19 03/22/19  Arrien, Jimmy Picket, MD  predniSONE (DELTASONE) 10 MG tablet Take 25 mg by mouth daily after breakfast.     [provider]    Family History Family History  Problem Relation Age of Onset  . Hypertension Mother     Social History Social History   Tobacco Use  . Smoking status: Former Smoker    Years: 10.00    Types: Pipe  . Smokeless tobacco: Former Systems developer    Quit date: 12/08/1979  Substance Use Topics  . Alcohol use: No    Alcohol/week: 0.0 standard drinks  . Drug use: No     Allergies   Ciprofloxacin and Shrimp [shellfish allergy]   Review of Systems Review of Systems  Constitutional: Negative for chills and fever.  HENT: Negative for congestion.   Eyes: Negative for visual disturbance.  Respiratory: Negative for cough.   Cardiovascular:  Negative for chest pain.  Gastrointestinal: Negative for abdominal pain, nausea and vomiting.  Genitourinary: Negative for difficulty urinating.  Musculoskeletal: Negative for myalgias.  Skin: Positive for color change.  Neurological: Negative for headaches.     Physical Exam Updated Vital Signs BP 134/79   Pulse 76   Temp (!) 97.5 F (36.4 C) (Oral)   Resp 15   Ht 5' 7"  (1.702 m)   Wt 73.9 kg   SpO2 98%   BMI 25.53 kg/m   Physical Exam Vitals signs and nursing note reviewed.  Constitutional:      Appearance: He is not ill-appearing.  HENT:     Head: Normocephalic and atraumatic.  Eyes:     Conjunctiva/sclera: Conjunctivae normal.  Neck:     Musculoskeletal: Neck supple.  Cardiovascular:     Rate and Rhythm: Normal rate and regular rhythm.  Pulmonary:     Effort: Pulmonary effort is normal.     Breath sounds: Normal breath sounds.  Abdominal:     Palpations: Abdomen is soft.     Tenderness: There is no abdominal tenderness.  Musculoskeletal:     Comments: See photos below.   Left foot with surgical pen along the fold of the ankle.  Redness does go past this line.  Necrosis noted to all toes.  Small necrotic ulcer to the lateral aspect of the left heel.  No obvious tenderness to palpation.  Foot is cold to the touch but compared to right foot it appears similar.  Able to find PT pulse by Doppler machine on left foot with a strong popliteal pulse.  Difficult time finding DP pulse to left foot..  DP pulse found on right foot.  Skin:    General: Skin is warm and dry.  Neurological:     Mental Status: He is alert.             ED Treatments / Results  Labs (all labs ordered are listed, but only abnormal results are displayed) Labs Reviewed  COMPREHENSIVE METABOLIC PANEL - Abnormal; Notable for the following components:      Result Value   CO2 21 (*)    Glucose, Bld 198 (*)    Calcium 8.8 (*)    Total Protein 5.5 (*)    Albumin 2.9 (*)  All other  components within normal limits  CBC WITH DIFFERENTIAL/PLATELET - Abnormal; Notable for the following components:   WBC 12.6 (*)    Hemoglobin 12.1 (*)    Neutro Abs 8.5 (*)    Monocytes Absolute 1.6 (*)    Abs Immature Granulocytes 0.21 (*)    All other components within normal limits  LACTIC ACID, PLASMA - Abnormal; Notable for the following components:   Lactic Acid, Venous 3.9 (*)    All other components within normal limits  NOVEL CORONAVIRUS, NAA (HOSPITAL ORDER, SEND-OUT TO REF LAB)  LACTIC ACID, PLASMA  CBC  BASIC METABOLIC PANEL    EKG None  Radiology Dg Foot Complete Left  Result Date: 03/01/2019 CLINICAL DATA:  78 year old male with increasing necrosis of toes on the left foot. EXAM: LEFT FOOT - COMPLETE 3+ VIEW COMPARISON:  02/15/2019 left foot series. FINDINGS: Extensive calcified peripheral vascular disease again noted. Bone mineralization in the left foot and toes appears stable from earlier this month. Stable joint spaces and alignment. No soft tissue gas identified. But there is increased soft tissue swelling of the distal foot. No fracture or osteolysis identified. Pes planus re-demonstrated. Grossly intact distal tibia and fibula. IMPRESSION: 1. Increased soft tissue swelling of the distal foot but no soft tissue gas or plain radiographic evidence of osteomyelitis. 2. Severe calcified peripheral vascular disease. Electronically Signed   By: Genevie Ann M.D.   On: 03/01/2019 12:41    Procedures Procedures (including critical care time)  Medications Ordered in ED Medications  potassium chloride SA (K-DUR) CR tablet 20-40 mEq (has no administration in time range)  ondansetron (ZOFRAN) injection 4 mg (has no administration in time range)  alum & mag hydroxide-simeth (MAALOX/MYLANTA) 200-200-20 MG/5ML suspension 15-30 mL (has no administration in time range)  pantoprazole (PROTONIX) EC tablet 40 mg (has no administration in time range)  labetalol (NORMODYNE) injection 10  mg (has no administration in time range)  hydrALAZINE (APRESOLINE) injection 5 mg (has no administration in time range)  metoprolol tartrate (LOPRESSOR) injection 2-5 mg (has no administration in time range)  guaiFENesin-dextromethorphan (ROBITUSSIN DM) 100-10 MG/5ML syrup 15 mL (has no administration in time range)  phenol (CHLORASEPTIC) mouth spray 1 spray (has no administration in time range)  enoxaparin (LOVENOX) injection 40 mg (has no administration in time range)  0.9 %  sodium chloride infusion (has no administration in time range)  acetaminophen (TYLENOL) tablet 325-650 mg (has no administration in time range)    Or  acetaminophen (TYLENOL) suppository 325-650 mg (has no administration in time range)  oxyCODONE (Oxy IR/ROXICODONE) immediate release tablet 5-10 mg (has no administration in time range)  morphine 2 MG/ML injection 2-5 mg (has no administration in time range)  docusate sodium (COLACE) capsule 100 mg (has no administration in time range)  insulin aspart (novoLOG) injection 0-15 Units (has no administration in time range)  feeding supplement (GLUCERNA SHAKE) (GLUCERNA SHAKE) liquid 237 mL (has no administration in time range)  heparin bolus via infusion 3,500 Units (3,500 Units Intravenous Bolus from Bag 03/01/19 1230)           Initial Impression / Assessment and Plan / ED Course  I have reviewed the triage vital signs and the nursing notes.  Pertinent labs & imaging results that were available during my care of the patient were reviewed by me and considered in my medical decision making (see chart for details).  Clinical Course as of Feb 28 1406  Mon Mar 01, 2019  1252 WBCMarland Kitchen):  12.6 [MV]  1252 Lactic Acid, Venous(!!): 3.9 [MV]    Clinical Course User Index [MV] Eustaquio Maize, PA-C   78 year old male who presents today with worsening black discoloration to his left foot.  Recently discharged from the hospital 8/15 after PT arthrectomy and angioplasty by Dr.  Oneida Alar and placed on Keflex. Currently has 1 day left of his Keflex.  Wife reports worsening foul smell to the left foot as well as drainage in between the toes.  Does have dopplerable PT pulse to left foot.  He last ate at 9:30 AM today.  Will consult Dr. Oneida Alar as he is aware that patient is coming.  We will get baseline labs and x-ray as well.  Patient's temp currently 97.5 without tachycardia or tachypnea.  Currently does not meet sirs or sepsis criteria.   White blood cell count of 12,000.  Lactic acid 3.9.  Likely elevated due to ischemia.  Consulted Dr. Eden Lathe who will come to the ED to evaluate patient.  Will await further recommendations prior to starting antibiotics.  Low suspicion for infected foot.  Discussed case with attending physician Dr. Tyrone Nine who suggest starting patient on heparin.  Pharmacy consult for heparin place.   Heparin order canceled per Dr. Nona Dell preference.  Any further reccs.   Dr. Oneida Alar evaluated patient and admitted patient for left BKA scheduled for tomorrow morning.       Final Clinical Impressions(s) / ED Diagnoses   Final diagnoses:  Peripheral arterial disease ([place])  Gangrene of foot [facility])    ED Discharge Orders    None       Eustaquio Maize, PA-C 03/01/19 [place], DO 03/03/19 1513

## 2019-03-01 NOTE — Progress Notes (Signed)
Pt transferred to 4E-11 via stretcher with staff. Pt walked to bed. Pt given CHG bath. Tele applied, CCMD notified. Pt oriented to call bell, room and bed. Call bell within reach. VSS. Will continue to monitor.  Amanda Cockayne, RN

## 2019-03-01 NOTE — Consult Note (Signed)
[facility] CONSULT NOTE   MRN : [id number]  Reason for Consult: Left LE infection with chronic PAD with tissue loss Referring Physician: ED  History of Present Illness: 78 y/o male brought to the ED by his wife secondary to left foot infection with purulent drainage and malodor.   Last seen for angiogram 02/17/2019 by Dr. Carlis Abbott.  The left LE had patent arterial flow to the ankle without filling of the plantar arch in the foot with poor outflow and small vessel disease. The anterior tibial appeared to occlude just above the ankle with a very diminutive dorsalis pedis full ofcalcium.  Arthrectomy performed to try and improve blood flow was not very successful.  He was discharged on 02/20/2019 with plans to observe the toes and forefoot allowing it to demarcate as long as he was comfortable , afebrile and had no sign of infection.        No current facility-administered medications for this encounter.    Current Outpatient Medications  Medication Sig Dispense Refill  . albuterol (VENTOLIN HFA) 108 (90 Base) MCG/ACT inhaler Inhale 2 puffs into the lungs every 4 (four) hours as needed for wheezing or shortness of breath.     . allopurinol (ZYLOPRIM) 100 MG tablet Take 100 mg by mouth daily.    Marland Kitchen amLODipine (NORVASC) 2.5 MG tablet Take 2.5 mg by mouth daily with supper.     Marland Kitchen aspirin EC 81 MG tablet Take 81 mg by mouth daily with supper.     . benzonatate (TESSALON) 100 MG capsule Take 100 mg by mouth 3 (three) times daily as needed for cough.    . brimonidine-timolol (COMBIGAN) 0.2-0.5 % ophthalmic solution Place 1 drop into both eyes 2 (two) times a day.    . cephALEXin (KEFLEX) 500 MG capsule Take 1 capsule (500 mg total) by mouth every 8 (eight) hours for 10 days. 30 capsule 0  . Certolizumab Pegol (CIMZIA) 2 X 200 MG KIT Inject 400 mg into the skin every 28 (twenty-eight) days.    . Cholecalciferol (VITAMIN D) 50 MCG (2000 UT) tablet Take 2,000 Units by mouth  daily.    . clopidogrel (PLAVIX) 75 MG tablet Take 1 tablet (75 mg total) by mouth daily with breakfast. 30 tablet 0  . clotrimazole-betamethasone (LOTRISONE) cream Apply 1 application topically 2 (two) times daily as needed (fungus). On the side of mouth    . Cyanocobalamin (B-12) 2500 MCG TABS Take 2,500 mcg by mouth daily.    . cyclobenzaprine (FLEXERIL) 10 MG tablet Take 10 mg by mouth daily as needed for muscle spasms.    . Dexamethasone Acetate 8 MG/ML SUSP Inject 8 mg into the muscle daily as needed (for Crohn's flare).    . diphenhydrAMINE (BENADRYL) 25 MG tablet Take 25 mg by mouth 2 (two) times daily as needed for allergies or sleep.     Marland Kitchen Emollient (ZIMS CRACK CREME) CREA Apply 1 application topically daily as needed (dry skin).     . famciclovir (FAMVIR) 250 MG tablet Take 250 mg by mouth daily as needed (fungus on corner of mouth flare).     . ferrous sulfate 325 (65 FE) MG tablet Take 325 mg by mouth daily.    Marland Kitchen ketoconazole (NIZORAL) 200 MG tablet Take 200 mg by mouth daily as needed (yeast infection).    Marland Kitchen latanoprost (XALATAN) 0.005 % ophthalmic solution Place 1 drop into both eyes at bedtime.    . mesalamine (PENTASA) 500 MG CR capsule  Take 2,000 mg by mouth 2 (two) times daily.    . Multiple Vitamin (MULTIVITAMIN WITH MINERALS) TABS Take 1 tablet by mouth daily.    . Omega-3 Fatty Acids (FISH OIL) 1000 MG CAPS Take 1,000 mg by mouth daily.    Marland Kitchen OVER THE COUNTER MEDICATION Take 5 mLs by mouth 2 (two) times daily. CBD oil drops    . pantoprazole (PROTONIX) 40 MG tablet Take 40 mg by mouth daily at 3 pm.     . pioglitazone (ACTOS) 15 MG tablet Take 15 mg by mouth daily.     . pravastatin (PRAVACHOL) 10 MG tablet Take 1 tablet (10 mg total) by mouth every evening. 30 tablet 0  . predniSONE (DELTASONE) 10 MG tablet Take 25 mg by mouth daily after breakfast.       Pt meds include: Statin :Yes Betablocker: No ASA: Yes Other anticoagulants/antiplatelets: plavix  Past Medical  History:  Diagnosis Date  . Bacteremia due to Escherichia coli 11/2013  . Crohn disease ([place])   . Diabetes mellitus without complication ([place])   . DM (diabetes mellitus), type 2 with peripheral vascular complications ([place]) 07/16/3233  . DVT (deep venous thrombosis) ([place]) 11/27/13  . History of skin cancer    ARMS & FACE  . Peripheral arterial disease ([place]) 02/15/2019  . Pulmonary embolism ([place]) 11/27/13     Past Surgical History:  Procedure Laterality Date  . ABDOMINAL AORTOGRAM N/A 12/14/2018   Procedure: ABDOMINAL AORTOGRAM;  Surgeon: Waynetta Sandy, MD;  Location: [facility];  Service: Cardiovascular;  Laterality: N/A;  . ABDOMINAL AORTOGRAM W/LOWER EXTREMITY Bilateral 02/17/2019   Procedure: ABDOMINAL AORTOGRAM W/LOWER EXTREMITY;  Surgeon: [name], MD;  Location: [facility];  Service: Cardiovascular;  Laterality: Bilateral;  . ABDOMINAL SURGERY     Bowel resection x2  . AMPUTATION Right 12/21/2018   Procedure: RIGHT LONG FINGER AMPUTATION;  Surgeon: Leanora Cover, MD;  Location: [facility];  Service: Orthopedics;  Laterality: Right;  . CHOLECYSTECTOMY    . COLON SURGERY    . HIP SURGERY     BOTH  . JOINT REPLACEMENT    . LOWER EXTREMITY ANGIOGRAPHY Right 12/14/2018   Procedure: LOWER EXTREMITY ANGIOGRAPHY;  Surgeon: Waynetta Sandy, MD;  Location: [facility];  Service: Cardiovascular;  Laterality: Right;  . PERIPHERAL VASCULAR ATHERECTOMY  02/17/2019   Procedure: PERIPHERAL VASCULAR ATHERECTOMY;  Surgeon: [name], MD;  Location: [facility];  Service: Cardiovascular;;  . SHOULDER SURGERY     RIGHT TENDON DETACHED  . UPPER EXTREMITY ANGIOGRAPHY Right 12/14/2018   Procedure: Right UPPER EXTREMITY ANGIOGRAPHY;  Surgeon: Waynetta Sandy, MD;  Location: [facility];  Service: Cardiovascular;  Laterality: Right;    Social History Social History   Tobacco Use  . Smoking status: Former  Smoker    Years: 10.00    Types: Pipe  . Smokeless tobacco: Former Systems developer    Quit date: 12/08/1979  Substance Use Topics  . Alcohol use: No    Alcohol/week: 0.0 standard drinks  . Drug use: No    Family History Family History  Problem Relation Age of Onset  . Hypertension Mother     Allergies  Allergen Reactions  . Ciprofloxacin Other (See Comments)    tendons hurt  . Shrimp [Shellfish Allergy]      REVIEW OF SYSTEMS  General: [ ] Weight loss, [ ] Fever, [ ] chills Neurologic: [ ] Dizziness, [ ] Blackouts, [ ]  Seizure [ ] Stroke, [ ] "Mini stroke", [ ] Slurred speech, [ ] Temporary blindness; [ ] weakness in arms or legs, [ ] Hoarseness [ ] Dysphagia Cardiac: [ ] Chest pain/pressure, [ ] Shortness of breath at rest [ ] Shortness of breath with exertion, [ ] Atrial fibrillation or irregular heartbeat  Vascular: [ ] Pain in legs with walking, [ ] Pain in legs at rest, [ ] Pain in legs at night,  [x ] Non-healing ulcer, [ ] Blood clot in vein/DVT,   Pulmonary: [ ] Home oxygen, [ ] Productive cough, [ ] Coughing up blood, [ ] Asthma,  [ ] Wheezing [ ] COPD Musculoskeletal:  [ ] Arthritis, [ ] Low back pain, [ ] Joint pain Hematologic: [ ] Easy Bruising, [ ] Anemia; [ ] Hepatitis Gastrointestinal: [ ] Blood in stool, [ ] Gastroesophageal Reflux/heartburn, Urinary: [ ] chronic Kidney disease, [ ] on HD - [ ] MWF or [ ] TTHS, [ ] Burning with urination, [ ] Difficulty urinating Skin: [ ] Rashes, [ ] Wounds Psychological: [ ] Anxiety, [ ] Depression  Physical Examination Vitals:   03/01/19 1123 03/01/19 1130 03/01/19 1200 03/01/19 1230  BP:  111/63 103/69 109/68  Pulse: 76 73 72 72  Resp: 15 14  15  Temp:      TempSrc:      SpO2: 98% 96% 96% 97%  Weight:      Height:       Body mass index is 25.53 kg/m.  General:  WDWN in NAD HENT: WNL Eyes: Pupils equal Pulmonary: normal non-labored breathing , without Rales, rhonchi,  wheezing Cardiac: RRR, without  Murmurs, rubs or  gallops; No carotid bruits Abdomen: soft, NT, no masses Skin: no rashes, ulcers noted;  positive left fore foot and toes Gangrene   Vascular Exam/Pulses:left toes with gangrene, "black" toes and foot from ankle down is cold to touch   Musculoskeletal: no muscle wasting or atrophy; no edema  Neurologic: A&O X 3; Appropriate Affect ;  SENSATION: normal; MOTOR FUNCTION: Motor in B UE and right LE intact, no active range of motion in left foot. Speech is fluent/normal   Significant Diagnostic Studies: CBC Lab Results  Component Value Date   WBC 12.6 (H) 03/01/2019   HGB 12.1 (L) 03/01/2019   HCT 39.6 03/01/2019   MCV 92.7 03/01/2019   PLT 358 03/01/2019    BMET    Component Value Date/Time   NA 140 03/01/2019 1158   K 3.8 03/01/2019 1158   CL 106 03/01/2019 1158   CO2 21 (L) 03/01/2019 1158   GLUCOSE 198 (H) 03/01/2019 1158   BUN 13 03/01/2019 1158   CREATININE 0.98 03/01/2019 1158   CALCIUM 8.8 (L) 03/01/2019 1158   GFRNONAA >60 03/01/2019 1158   GFRAA >60 03/01/2019 1158   Estimated Creatinine Clearance: 60 mL/min (by C-G formula based on SCr of 0.98 mg/dL).  COAG Lab Results  Component Value Date   INR 1.0 02/15/2019   INR 1.41 12/14/2013   INR 1.15 12/14/2013      Angiogram 02/15/19 Left lower extremity arteriogram showed widely patent common femoral, profunda, SFA, above and below-knee popliteal artery.  Patient had pretty brisk flow down the anterior tibial and peroneal arteries.  The posterior tibial was fairly diseased throughout its course with multiple greater than 90% focal stenosis and heavily calcified.  There was no significant filling of the plantar arch in the foot with poor outflow and small vessel disease.    The anterior tibial appeared to occlude just above the ankle with a very diminutive dorsalis pedis full of calcium.  Left lower extremity posterior mechanical atherectomy was performed at low medium and high speeds.  Posterior tibial artery was  then angioplastied with a 3 mm balloon throughout its course.  I then had pretty-sluggish flow down the posterior tibial after intervention.  There was very poor outflow in the foot prior to intervention.  As a result to try and improve outflow went down and angioplastied the posterior tibial artery at the ankle with a 1.5 mm [place].  At completion of the case he had very brisk flow down the anterior tibial peroneal which were preserved.  Flow was very sluggish down the posterior tibial.  However, very brisk posterior tibial signal improved.  Hopefully when he warms up whether this is related to spasm or whether potentially he has very poor outflow to support our posterior tibial intervention.   ASSESSMENT/PLAN:  Left foot gangrene with no arterial flow below the ankle.  He has no revascularization options. Plan left BKA tomorrow by Dr. Dickson.   NPO past MN Patient and wife in agreement with plan.   Maureen  03/01/2019 1:08 PM  

## 2019-03-02 ENCOUNTER — Encounter (HOSPITAL_COMMUNITY)
Admission: EM | Disposition: A | Discharge status: Home Health | Payer: Self-pay | Source: Home / Self Care | Attending: Vascular Surgery

## 2019-03-02 LAB — CBC
HCT: 34 % — ABNORMAL LOW (ref 39.0–52.0)
Hemoglobin: 10.7 g/dL — ABNORMAL LOW (ref 13.0–17.0)
MCH: 28.3 pg (ref 26.0–34.0)
MCHC: 31.5 g/dL (ref 30.0–36.0)
MCV: 89.9 fL (ref 80.0–100.0)
Platelets: 349 10*3/uL (ref 150–400)
RBC: 3.78 MIL/uL — ABNORMAL LOW (ref 4.22–5.81)
RDW: 14.2 % (ref 11.5–15.5)
WBC: 13.2 10*3/uL — ABNORMAL HIGH (ref 4.0–10.5)
nRBC: 0 % (ref 0.0–0.2)

## 2019-03-02 LAB — GLUCOSE, CAPILLARY
Glucose-Capillary: 101 mg/dL — ABNORMAL HIGH (ref 70–99)
Glucose-Capillary: 112 mg/dL — ABNORMAL HIGH (ref 70–99)
Glucose-Capillary: 154 mg/dL — ABNORMAL HIGH (ref 70–99)
Glucose-Capillary: 177 mg/dL — ABNORMAL HIGH (ref 70–99)

## 2019-03-02 LAB — HEMOGLOBIN A1C
Hgb A1c MFr Bld: 6.6 % — ABNORMAL HIGH (ref 4.8–5.6)
Mean Plasma Glucose: 142.72 mg/dL

## 2019-03-02 SURGERY — AMPUTATION BELOW KNEE
Anesthesia: Choice | Site: Knee | Laterality: Left

## 2019-03-02 MED ORDER — DIPHENHYDRAMINE HCL 25 MG PO CAPS: 25.0000 mg | ORAL_CAPSULE | Freq: Every evening | ORAL | Status: DC | PRN

## 2019-03-03 ENCOUNTER — Inpatient Hospital Stay (HOSPITAL_COMMUNITY): Payer: Medicare HMO | Admitting: Anesthesiology

## 2019-03-03 ENCOUNTER — Encounter (HOSPITAL_COMMUNITY): Payer: Self-pay | Admitting: Orthopedic Surgery

## 2019-03-03 ENCOUNTER — Encounter (HOSPITAL_COMMUNITY)
Admission: EM | Disposition: A | Discharge status: Home Health | Payer: Self-pay | Source: Home / Self Care | Attending: Vascular Surgery

## 2019-03-03 HISTORY — PX: AMPUTATION: SHX166

## 2019-03-03 LAB — NOVEL CORONAVIRUS, NAA (HOSP ORDER, SEND-OUT TO REF LAB; TAT 18-24 HRS): SARS-CoV-2, NAA: NOT DETECTED

## 2019-03-03 LAB — GLUCOSE, CAPILLARY
Glucose-Capillary: 118 mg/dL — ABNORMAL HIGH (ref 70–99)
Glucose-Capillary: 126 mg/dL — ABNORMAL HIGH (ref 70–99)
Glucose-Capillary: 138 mg/dL — ABNORMAL HIGH (ref 70–99)
Glucose-Capillary: 152 mg/dL — ABNORMAL HIGH (ref 70–99)
Glucose-Capillary: 155 mg/dL — ABNORMAL HIGH (ref 70–99)
Glucose-Capillary: 191 mg/dL — ABNORMAL HIGH (ref 70–99)

## 2019-03-03 SURGERY — AMPUTATION BELOW KNEE
Anesthesia: General | Site: Knee | Laterality: Left

## 2019-03-03 MED ORDER — MORPHINE SULFATE (PF) 2 MG/ML IV SOLN
2.0000 mg | INTRAVENOUS | Status: DC | PRN
  Administered 2019-03-03 – 2019-03-04 (×3): 2 mg via INTRAVENOUS
  Filled 2019-03-03 (×3): qty 1

## 2019-03-03 MED ORDER — ENOXAPARIN SODIUM 40 MG/0.4ML ~~LOC~~ SOLN
40.0000 mg | SUBCUTANEOUS | Status: DC
  Administered 2019-03-04 – 2019-03-17 (×14): 40 mg via SUBCUTANEOUS
  Filled 2019-03-03 (×14): qty 0.4

## 2019-03-03 MED ORDER — CLOPIDOGREL BISULFATE 75 MG PO TABS
75.0000 mg | ORAL_TABLET | Freq: Every day | ORAL | Status: DC
  Administered 2019-03-04 – 2019-03-18 (×15): 75 mg via ORAL
  Filled 2019-03-03 (×15): qty 1

## 2019-03-03 MED ORDER — LABETALOL HCL 5 MG/ML IV SOLN: 10.0000 mg | INTRAVENOUS | Status: DC | PRN

## 2019-03-03 MED ORDER — FENTANYL CITRATE (PF) 100 MCG/2ML IJ SOLN
25.0000 ug | INTRAMUSCULAR | Status: DC | PRN
  Administered 2019-03-03: 50 ug via INTRAVENOUS

## 2019-03-03 MED ORDER — FENTANYL CITRATE (PF) 100 MCG/2ML IJ SOLN: 25.0000 ug | INTRAMUSCULAR | Status: DC | PRN

## 2019-03-03 MED ORDER — POTASSIUM CHLORIDE CRYS ER 20 MEQ PO TBCR: 20.0000 meq | EXTENDED_RELEASE_TABLET | Freq: Every day | ORAL | Status: DC | PRN

## 2019-03-03 MED ORDER — PROPOFOL 10 MG/ML IV BOLUS
INTRAVENOUS | Status: DC | PRN
  Administered 2019-03-03: 50 mg via INTRAVENOUS
  Administered 2019-03-03: 150 mg via INTRAVENOUS

## 2019-03-03 MED ORDER — FENTANYL CITRATE (PF) 250 MCG/5ML IJ SOLN
INTRAMUSCULAR | Status: AC
  Filled 2019-03-03: qty 5

## 2019-03-03 MED ORDER — BACITRACIN ZINC 500 UNIT/GM EX OINT
TOPICAL_OINTMENT | CUTANEOUS | Status: AC
  Filled 2019-03-03: qty 28.35

## 2019-03-03 MED ORDER — FENTANYL CITRATE (PF) 100 MCG/2ML IJ SOLN
INTRAMUSCULAR | Status: AC
  Administered 2019-03-03: 50 ug
  Filled 2019-03-03: qty 2

## 2019-03-03 MED ORDER — CEFAZOLIN SODIUM-DEXTROSE 2-4 GM/100ML-% IV SOLN
2.0000 g | Freq: Three times a day (TID) | INTRAVENOUS | Status: AC
  Administered 2019-03-03 – 2019-03-04 (×2): 2 g via INTRAVENOUS
  Filled 2019-03-03 (×2): qty 100

## 2019-03-03 MED ORDER — LACTATED RINGERS IV SOLN
INTRAVENOUS | Status: DC
  Administered 2019-03-03: 09:00:00 via INTRAVENOUS

## 2019-03-03 MED ORDER — LIDOCAINE 2% (20 MG/ML) 5 ML SYRINGE
INTRAMUSCULAR | Status: DC | PRN
  Administered 2019-03-03: 100 mg via INTRAVENOUS

## 2019-03-03 MED ORDER — PROPOFOL 500 MG/50ML IV EMUL
INTRAVENOUS | Status: DC | PRN
  Administered 2019-03-03: 25 ug/kg/min via INTRAVENOUS

## 2019-03-03 MED ORDER — 0.9 % SODIUM CHLORIDE (POUR BTL) OPTIME
TOPICAL | Status: DC | PRN
  Administered 2019-03-03: 10:00:00 1000 mL

## 2019-03-03 MED ORDER — FENTANYL CITRATE (PF) 250 MCG/5ML IJ SOLN
INTRAMUSCULAR | Status: DC | PRN
  Administered 2019-03-03 (×2): 50 ug via INTRAVENOUS

## 2019-03-03 MED ORDER — BACITRACIN ZINC 500 UNIT/GM EX OINT
TOPICAL_OINTMENT | CUTANEOUS | Status: DC | PRN
  Administered 2019-03-03: 1 via TOPICAL

## 2019-03-03 MED ORDER — CEFAZOLIN SODIUM-DEXTROSE 2-4 GM/100ML-% IV SOLN
INTRAVENOUS | Status: AC
  Filled 2019-03-03: qty 100

## 2019-03-03 MED ORDER — CEFAZOLIN SODIUM-DEXTROSE 2-4 GM/100ML-% IV SOLN
2.0000 g | Freq: Once | INTRAVENOUS | Status: AC
  Administered 2019-03-03: 2 g via INTRAVENOUS

## 2019-03-03 MED ORDER — CEPHALEXIN 500 MG PO CAPS
500.0000 mg | ORAL_CAPSULE | Freq: Three times a day (TID) | ORAL | Status: DC
  Administered 2019-03-04 – 2019-03-18 (×42): 500 mg via ORAL
  Filled 2019-03-03 (×42): qty 1

## 2019-03-03 SURGICAL SUPPLY — 58 items
BANDAGE ESMARK 6X9 LF (GAUZE/BANDAGES/DRESSINGS) IMPLANT
BLADE SAW SGTL 73X25 THK (BLADE) ×2 IMPLANT
BNDG COHESIVE 6X5 TAN STRL LF (GAUZE/BANDAGES/DRESSINGS) ×2 IMPLANT
BNDG ELASTIC 4X5.8 VLCR STR LF (GAUZE/BANDAGES/DRESSINGS) ×2 IMPLANT
BNDG ELASTIC 6X5.8 VLCR STR LF (GAUZE/BANDAGES/DRESSINGS) ×2 IMPLANT
BNDG ESMARK 6X9 LF (GAUZE/BANDAGES/DRESSINGS)
BNDG GAUZE ELAST 4 BULKY (GAUZE/BANDAGES/DRESSINGS) ×2 IMPLANT
CANISTER SUCT 3000ML PPV (MISCELLANEOUS) ×2 IMPLANT
CLIP VESOCCLUDE MED 6/CT (CLIP) IMPLANT
COVER BACK TABLE 60X90IN (DRAPES) IMPLANT
COVER SURGICAL LIGHT HANDLE (MISCELLANEOUS) ×2 IMPLANT
COVER WAND RF STERILE (DRAPES) ×2 IMPLANT
CUFF TOURN SGL QUICK 24 (TOURNIQUET CUFF)
CUFF TOURN SGL QUICK 34 (TOURNIQUET CUFF)
CUFF TOURN SGL QUICK 42 (TOURNIQUET CUFF) IMPLANT
CUFF TRNQT CYL 24X4X16.5-23 (TOURNIQUET CUFF) IMPLANT
CUFF TRNQT CYL 34X4.125X (TOURNIQUET CUFF) IMPLANT
DRAIN CHANNEL 19F RND (DRAIN) IMPLANT
DRAPE HALF SHEET 40X57 (DRAPES) ×2 IMPLANT
DRAPE ORTHO SPLIT 77X108 STRL (DRAPES) ×2
DRAPE SURG ORHT 6 SPLT 77X108 (DRAPES) ×2 IMPLANT
DRSG ADAPTIC 3X8 NADH LF (GAUZE/BANDAGES/DRESSINGS) ×2 IMPLANT
ELECT REM PT RETURN 9FT ADLT (ELECTROSURGICAL) ×2
ELECTRODE REM PT RTRN 9FT ADLT (ELECTROSURGICAL) ×1 IMPLANT
EVACUATOR SILICONE 100CC (DRAIN) IMPLANT
GAUZE SPONGE 4X4 12PLY STRL (GAUZE/BANDAGES/DRESSINGS) ×4 IMPLANT
GAUZE SPONGE 4X4 12PLY STRL LF (GAUZE/BANDAGES/DRESSINGS) ×2 IMPLANT
GLOVE BIO SURGEON STRL SZ7.5 (GLOVE) ×2 IMPLANT
GLOVE BIOGEL PI IND STRL 6.5 (GLOVE) ×1 IMPLANT
GLOVE BIOGEL PI IND STRL 7.5 (GLOVE) ×1 IMPLANT
GLOVE BIOGEL PI IND STRL 8 (GLOVE) ×1 IMPLANT
GLOVE BIOGEL PI INDICATOR 6.5 (GLOVE) ×1
GLOVE BIOGEL PI INDICATOR 7.5 (GLOVE) ×1
GLOVE BIOGEL PI INDICATOR 8 (GLOVE) ×1
GOWN STRL REUS W/ TWL LRG LVL3 (GOWN DISPOSABLE) ×2 IMPLANT
GOWN STRL REUS W/ TWL XL LVL3 (GOWN DISPOSABLE) ×2 IMPLANT
GOWN STRL REUS W/TWL LRG LVL3 (GOWN DISPOSABLE) ×2
GOWN STRL REUS W/TWL XL LVL3 (GOWN DISPOSABLE) ×2
KIT BASIN OR (CUSTOM PROCEDURE TRAY) ×2 IMPLANT
KIT TURNOVER KIT B (KITS) ×2 IMPLANT
NS IRRIG 1000ML POUR BTL (IV SOLUTION) ×2 IMPLANT
PACK GENERAL/GYN (CUSTOM PROCEDURE TRAY) ×2 IMPLANT
PAD ARMBOARD 7.5X6 YLW CONV (MISCELLANEOUS) ×4 IMPLANT
STAPLER VISISTAT 35W (STAPLE) ×2 IMPLANT
STOCKINETTE IMPERVIOUS LG (DRAPES) ×2 IMPLANT
SUT ETHILON 3 0 PS 1 (SUTURE) IMPLANT
SUT SILK 0 TIES 10X30 (SUTURE) IMPLANT
SUT SILK 2 0 (SUTURE) ×2
SUT SILK 2 0 SH CR/8 (SUTURE) ×2 IMPLANT
SUT SILK 2-0 18XBRD TIE 12 (SUTURE) ×2 IMPLANT
SUT SILK 3 0 (SUTURE) ×1
SUT SILK 3-0 18XBRD TIE 12 (SUTURE) ×1 IMPLANT
SUT VIC AB 2-0 CT1 18 (SUTURE) ×6 IMPLANT
SUT VIC AB 3-0 SH 18 (SUTURE) IMPLANT
TAPE UMBILICAL COTTON 1/8X30 (MISCELLANEOUS) ×2 IMPLANT
TOWEL GREEN STERILE (TOWEL DISPOSABLE) ×4 IMPLANT
UNDERPAD 30X30 (UNDERPADS AND DIAPERS) ×2 IMPLANT
WATER STERILE IRR 1000ML POUR (IV SOLUTION) ×2 IMPLANT

## 2019-03-03 NOTE — Anesthesia Postprocedure Evaluation (Signed)
Anesthesia Post Note  Patient: Rodney Sullivan  Procedure(s) Performed: AMPUTATION BELOW KNEE (Left Knee)     Anesthesia Post Evaluation  Last Vitals:  Vitals:   03/03/19 1114 03/03/19 1207  BP: 126/78   Pulse:    Resp: 18   Temp: 37 C 36.8 C  SpO2: 97%     Last Pain:  Vitals:   03/03/19 1114  TempSrc: Oral  PainSc:                  Kanani Mowbray

## 2019-03-03 NOTE — Anesthesia Procedure Notes (Signed)
Procedure Name: LMA Insertion Date/Time: 03/03/2019 9:16 AM Performed by: Scheryl Darter, CRNA Pre-anesthesia Checklist: Patient identified, Emergency Drugs available, Suction available and Patient being monitored Patient Re-evaluated:Patient Re-evaluated prior to induction Oxygen Delivery Method: Circle System Utilized Preoxygenation: Pre-oxygenation with 100% oxygen Induction Type: IV induction Ventilation: Mask ventilation without difficulty LMA: LMA inserted LMA Size: 4.0 Number of attempts: 1 Airway Equipment and Method: Bite block Placement Confirmation: positive ETCO2 Tube secured with: Tape Dental Injury: Teeth and Oropharynx as per pre-operative assessment

## 2019-03-03 NOTE — Progress Notes (Signed)
[facility]  Subjective  - No complaints.   Objective 125/74 84 98.8 F (37.1 C) (Oral) 19 96%  Intake/Output Summary (Last 24 hours) at 03/03/2019 0842 Last data filed at 03/03/2019 0400 Gross per 24 hour  Intake 480 ml  Output -  Net 480 ml    Extremities: gangrene of all 5 toes left foot with erythema and coolness extending to the ankle level, 4 cm ulceration with necrotic eschar left lateral heel  Laboratory Lab Results: Recent Labs    03/01/19 2110 03/02/19 0243  WBC 13.4* 13.2*  HGB 11.5* 10.7*  HCT 36.0* 34.0*  PLT 368 349   BMET Recent Labs    03/01/19 1158 03/01/19 1510  NA 140 140  K 3.8 4.1  CL 106 104  CO2 21* 23  GLUCOSE 198* 239*  BUN 13 12  CREATININE 0.98 1.02  CALCIUM 8.8* 8.6*    COAG Lab Results  Component Value Date   INR 1.0 02/15/2019   INR 1.41 12/14/2013   INR 1.15 12/14/2013   No results found for: PTT  Assessment/Planning:  78 yo M with critical limb ischemia of left lower extremity.  Previous revascularization with ongoing tissue loss.  Plan for left BKA today.  Risks and benefits discussed with patient.  Rodney Sullivan 03/03/2019 8:42 AM --

## 2019-03-03 NOTE — Progress Notes (Signed)
Inpatient Rehab Admissions:  Inpatient Rehab Consult received. Note pt POD #0, pending PT/OT evals.  Will await therapy recommendations to determine most appropriate post acute level of care.    Shann Medal, PT, DPT Admissions Coordinator [phone] 03/03/19  12:30 PM

## 2019-03-03 NOTE — Anesthesia Postprocedure Evaluation (Signed)
Anesthesia Post Note  Patient: Rodney Sullivan  Procedure(s) Performed: AMPUTATION BELOW KNEE (Left Knee)     Patient location during evaluation: PACU Anesthesia Type: General Level of consciousness: awake Pain management: pain level controlled Vital Signs Assessment: post-procedure vital signs reviewed and stable Respiratory status: spontaneous breathing Cardiovascular status: stable Postop Assessment: no apparent nausea or vomiting Anesthetic complications: no    Last Vitals:  Vitals:   03/03/19 1114 03/03/19 1207  BP: 126/78   Pulse:    Resp: 18   Temp: 37 C 36.8 C  SpO2: 97%     Last Pain:  Vitals:   03/03/19 1114  TempSrc: Oral  PainSc:                  Vesta Wheeland

## 2019-03-03 NOTE — Anesthesia Preprocedure Evaluation (Signed)
Anesthesia Evaluation  Patient identified by MRN, date of birth, ID band Patient awake    Reviewed: Allergy & Precautions, NPO status , Patient's Chart, lab work & pertinent test results  Airway Mallampati: II  TM Distance: >3 FB     Dental   Pulmonary former smoker,    breath sounds clear to auscultation       Cardiovascular + Peripheral Vascular Disease   Rhythm:Regular Rate:Normal     Neuro/Psych    GI/Hepatic negative GI ROS, Neg liver ROS,   Endo/Other  diabetes  Renal/GU negative Renal ROS     Musculoskeletal   Abdominal   Peds  Hematology   Anesthesia Other Findings   Reproductive/Obstetrics                             Anesthesia Physical Anesthesia Plan  ASA: III  Anesthesia Plan: General   Post-op Pain Management:    Induction: Intravenous  PONV Risk Score and Plan:   Airway Management Planned: LMA  Additional Equipment:   Intra-op Plan:   Post-operative Plan: Extubation in OR  Informed Consent: I have reviewed the patients History and Physical, chart, labs and discussed the procedure including the risks, benefits and alternatives for the proposed anesthesia with the patient or authorized representative who has indicated his/her understanding and acceptance.     Dental advisory given  Plan Discussed with: CRNA and Anesthesiologist  Anesthesia Plan Comments:         Anesthesia Quick Evaluation

## 2019-03-03 NOTE — Progress Notes (Signed)
Changed dressing to left toes, per MD order.

## 2019-03-03 NOTE — Op Note (Signed)
Date: March 03, 2019  Preoperative diagnosis: Critical limb ischemia of the left lower extremity with severe tissue loss and gangrenous foot  Postoperative diagnosis: Same  Procedure: Left below-knee amputation  Surgeon: Dr. [name], MD  Assistant: Leontine Locket, PA  Indication: Patient 78 year old male who previously presented with critical limb ischemia of his left lower extremity with tissue loss.  He ultimately underwent tibial intervention with atherectomy and balloon angioplasty.  Unfortunately his tissue loss progressed and the foot is no longer salvageable.  He presents today for left below-knee amputation after being evaluated by Dr. Oneida Alar in the ED several nights ago.  Risk benefits have been discussed.  Findings: Left below-knee amputation successfully performed.  Proximal tissue margins appeared healthy.  His skin and fascia was very thin and friable.  Anesthesia: General  Details: The patient was taken to the operating room after informed consent was obtained.  General endotracheal anesthesia was induced.  His left leg and foot were then prepped and draped in usual sterile fashion.  A preop timeout was performed to identify patient procedure and site.  Initially marked out one handsbreadth below the left tibial tuberosity for level of transection of the tibia.  I then took a suture to measure the circumference here that suture was divided to two thirds and one thirds length.  The two thirds length was used to mark the anterior posterior length of our incision and then the one third distance was used to mark the length of the flap.  Incision was made with 10 blade scalpel through the skin and subcutaneous tissue.  Ultimately dissection was carried out with Bovie cautery to open the fascia.  Muscle was divided with Bovie cautery.  Anterior tibial was identified and ligated between 2-0 silk ties and divided.  Ultimately isolated the fibula that was transected with  hand-held bone cutter.  The tibia was cut with the oscillating saw.  We completed the posterior flap leaving a wall a nice long piece of gastrocnemius.  Identified the posterior tibial and peroneal arteries that were then ligated between hemostats and divided with 2-0 silk ties.  Once the flap was completed it was copiously irrigated till everything was clean.  The flap was then reapproximated with 2-0 Vicryl's reapproximating the fascia.  The skin was reapproximated with staples.  Sterile dressings were applied.  Condition: Stable  Complication: None  [name], MD [facility] Office: [phone] Pager: [place]

## 2019-03-03 NOTE — Transfer of Care (Signed)
Immediate Anesthesia Transfer of Care Note  Patient: Rodney Sullivan  Procedure(s) Performed: AMPUTATION BELOW KNEE (Left Knee)  Patient Location: PACU  Anesthesia Type:General  Level of Consciousness: awake, alert , oriented and sedated  Airway & Oxygen Therapy: Patient Spontanous Breathing and Patient connected to nasal cannula oxygen  Post-op Assessment: Report given to RN, Post -op Vital signs reviewed and stable and Patient moving all extremities  Post vital signs: Reviewed and stable  Last Vitals:  Vitals Value Taken Time  BP 141/81 03/03/19 1030  Temp 36.9 C 03/03/19 1030  Pulse 100 03/03/19 1030  Resp 20 03/03/19 1030  SpO2 97 % 03/03/19 1030    Last Pain:  Vitals:   03/03/19 1030  TempSrc:   PainSc: Asleep         Complications: No apparent anesthesia complications

## 2019-03-03 NOTE — Plan of Care (Signed)
Nursing will continue to monitor.

## 2019-03-04 ENCOUNTER — Encounter (HOSPITAL_COMMUNITY): Payer: Self-pay | Admitting: Vascular Surgery

## 2019-03-04 ENCOUNTER — Encounter: Payer: Medicare HMO | Admitting: Vascular Surgery

## 2019-03-04 ENCOUNTER — Other Ambulatory Visit: Payer: Self-pay

## 2019-03-04 LAB — CBC
HCT: 35.6 % — ABNORMAL LOW (ref 39.0–52.0)
Hemoglobin: 10.9 g/dL — ABNORMAL LOW (ref 13.0–17.0)
MCH: 28 pg (ref 26.0–34.0)
MCHC: 30.6 g/dL (ref 30.0–36.0)
MCV: 91.5 fL (ref 80.0–100.0)
Platelets: 296 10*3/uL (ref 150–400)
RBC: 3.89 MIL/uL — ABNORMAL LOW (ref 4.22–5.81)
RDW: 14.2 % (ref 11.5–15.5)
WBC: 17.4 10*3/uL — ABNORMAL HIGH (ref 4.0–10.5)
nRBC: 0.1 % (ref 0.0–0.2)

## 2019-03-04 LAB — BASIC METABOLIC PANEL
Anion gap: 11 (ref 5–15)
BUN: 7 mg/dL — ABNORMAL LOW (ref 8–23)
CO2: 24 mmol/L (ref 22–32)
Calcium: 8.3 mg/dL — ABNORMAL LOW (ref 8.9–10.3)
Chloride: 105 mmol/L (ref 98–111)
Creatinine, Ser: 0.92 mg/dL (ref 0.61–1.24)
GFR calc Af Amer: >60 mL/min (ref >60)
GFR calc non Af Amer: >60 mL/min (ref >60)
Glucose, Bld: 160 mg/dL — ABNORMAL HIGH (ref 70–99)
Potassium: 3.4 mmol/L — ABNORMAL LOW (ref 3.5–5.1)
Sodium: 140 mmol/L (ref 135–145)

## 2019-03-04 LAB — GLUCOSE, CAPILLARY
Glucose-Capillary: 175 mg/dL — ABNORMAL HIGH (ref 70–99)
Glucose-Capillary: 215 mg/dL — ABNORMAL HIGH (ref 70–99)
Glucose-Capillary: 254 mg/dL — ABNORMAL HIGH (ref 70–99)
Glucose-Capillary: 247 mg/dL — ABNORMAL HIGH (ref 70–99)

## 2019-03-04 MED ORDER — PHENYLEPHRINE HCL-NACL 10-0.9 MG/250ML-% IV SOLN
INTRAVENOUS | Status: AC
  Filled 2019-03-04: qty 500

## 2019-03-04 NOTE — Progress Notes (Signed)
Inpatient Rehab Admissions:  Inpatient Rehab Consult received.  I met with patient and his wife at the bedside for rehabilitation assessment and to discuss goals and expectations of an inpatient rehab admission.  Pt is lethargic and does not participate much in conversation, but wife reports him having received pain medicine this morning that's made him sleepy.  Pt would be an excellent CIR candidate.  Will open insurance for possible admission once medically ready.   Signed: Shann Medal, PT, DPT Admissions Coordinator [phone] 03/04/19  12:32 PM

## 2019-03-04 NOTE — Evaluation (Signed)
Occupational Therapy Evaluation Patient Details Name: Rodney Sullivan MRN: [id number] DOB: 14-Oct-1940 Today's Date: 03/04/2019    History of Present Illness Pt is a 78 year old man admitted on 03/01/19 with L foot gangrene. Underwent L BKA  on 03/03/19. PMH: PVD, DM, former smoker.   Clinical Impression   Pt ambulated with a rollator and was modified independent in self care. Pt presents with lethargy, likely due to medication. Requires 2 person moderate assistance for all mobility and up to total assist for ADL. Pt has excellent family support and will thrive with intensive rehab. Recommending CIR. Will follow acutely.    Follow Up Recommendations  CIR    Equipment Recommendations  3 in 1 bedside commode;Tub/shower seat    Recommendations for Other Services       Precautions / Restrictions Precautions Precautions: Fall Restrictions Weight Bearing Restrictions: Yes LLE Weight Bearing: Non weight bearing      Mobility Bed Mobility Overal bed mobility: Needs Assistance Bed Mobility: Supine to Sit     Supine to sit: +2 for physical assistance;Mod assist     General bed mobility comments: cues for technique, assist to initiate, advance hips to EOB with bed pad and to raise trunk  Transfers Overall transfer level: Needs assistance   Transfers: Sit to/from Stand;Squat Pivot Transfers Sit to Stand: +2 physical assistance;Mod assist   Squat pivot transfers: +2 physical assistance;Mod assist     General transfer comment: assist to rise and steady, pt stood x 2 with RW, but unable to pivot on R foot in standing, removed walker and performed squat pivot toward R side    Balance Overall balance assessment: Needs assistance   Sitting balance-Leahy Scale: Fair     Standing balance support: Bilateral upper extremity supported Standing balance-Leahy Scale: Poor                             ADL either performed or assessed with clinical judgement   ADL Overall  ADL's : Needs assistance/impaired Eating/Feeding: Minimal assistance;Sitting   Grooming: Wash/dry hands;Wash/dry face;Sitting;Min guard;Set up   Upper Body Bathing: Minimal assistance;Sitting   Lower Body Bathing: Total assistance;+2 for physical assistance;Sit to/from stand   Upper Body Dressing : Minimal assistance;Sitting   Lower Body Dressing: +2 for physical assistance;Total assistance;Sit to/from stand   Toilet Transfer: +2 for physical assistance;Squat-pivot;Moderate assistance   Toileting- Clothing Manipulation and Hygiene: +2 for physical assistance;Total assistance;Sit to/from stand               Vision Baseline Vision/History: Wears glasses Wears Glasses: Reading only Patient Visual Report: No change from baseline       Perception     Praxis      Pertinent Vitals/Pain Pain Assessment: Faces Faces Pain Scale: Hurts little more Pain Location: L LE Pain Descriptors / Indicators: Operative site guarding;Grimacing Pain Intervention(s): Repositioned;Premedicated before session     Hand Dominance Right   Extremity/Trunk Assessment Upper Extremity Assessment Upper Extremity Assessment: RUE deficits/detail;Generalized weakness RUE Deficits / Details: Per wife, pt only has "one tendon" in his R shoulder.   Lower Extremity Assessment Lower Extremity Assessment: Defer to PT evaluation       Communication Communication Communication: No difficulties   Cognition Arousal/Alertness: Lethargic;Suspect due to medications Behavior During Therapy: Flat affect Overall Cognitive Status: Difficult to assess  General Comments       Exercises     Shoulder Instructions      Home Living Family/patient expects to be discharged to:: Private residence Living Arrangements: Spouse/significant other Available Help at Discharge: Family;Available 24 hours/day Type of Home: House Home Access: Ramped entrance      Home Layout: One level     Bathroom Shower/Tub: Occupational psychologist: Handicapped height     Home Equipment: Environmental consultant - 2 wheels;Walker - 4 wheels;Wheelchair - manual;Grab bars - toilet;Hand held shower head(needs a new shower seat )          Prior Functioning/Environment Level of Independence: Independent with assistive device(s)        Comments: walked with a rollator        OT Problem List: Decreased strength;Decreased activity tolerance;Impaired balance (sitting and/or standing);Decreased knowledge of use of DME or AE;Pain      OT Treatment/Interventions: Self-care/ADL training;DME and/or AE instruction;Balance training;Patient/family education;Therapeutic activities;Therapeutic exercise    OT Goals(Current goals can be found in the care plan section) Acute Rehab OT Goals Patient Stated Goal: get stronger and eventually return home OT Goal Formulation: With patient/family Time For Goal Achievement: 03/18/19 Potential to Achieve Goals: Good ADL Goals Pt Will Perform Lower Body Bathing: with supervision;sitting/lateral leans;with adaptive equipment Pt Will Perform Lower Body Dressing: with supervision;with adaptive equipment;sit to/from stand Pt Will Transfer to Toilet: with supervision;stand pivot transfer;bedside commode Pt Will Perform Toileting - Clothing Manipulation and hygiene: with supervision;sitting/lateral leans Pt/caregiver will Perform Home Exercise Program: Increased strength;Both right and left upper extremity;Independently;With written HEP provided Additional ADL Goal #1: Pt will perform bed mobility with supervision in preparation for ADL.  OT Frequency: Min 2X/week   Barriers to D/C:            Co-evaluation PT/OT/SLP Co-Evaluation/Treatment: Yes Reason for Co-Treatment: For patient/therapist safety   OT goals addressed during session: ADL's and self-care      AM-PAC OT "6 Clicks" Daily Activity     Outcome Measure Help from  another person eating meals?: A Little Help from another person taking care of personal grooming?: A Little Help from another person toileting, which includes using toliet, bedpan, or urinal?: Total Help from another person bathing (including washing, rinsing, drying)?: A Lot Help from another person to put on and taking off regular upper body clothing?: A Little Help from another person to put on and taking off regular lower body clothing?: Total 6 Click Score: 13   End of Session Equipment Utilized During Treatment: Gait belt;Rolling walker Nurse Communication: Mobility status  Activity Tolerance: Patient limited by lethargy Patient left: in chair;with call bell/phone within reach;with chair alarm set;with family/visitor present  OT Visit Diagnosis: Unsteadiness on feet (R26.81);Pain;Muscle weakness (generalized) (M62.81)                Time: 1610-9604 OT Time Calculation (min): 32 min Charges:  OT General Charges $OT Visit: 1 Visit OT Evaluation $OT Eval Moderate Complexity: 1 Mod  Nestor Lewandowsky, OTR/L Acute Rehabilitation Services Pager: [phone] Office: [phone]  Malka So 03/04/2019, 11:51 AM

## 2019-03-04 NOTE — Progress Notes (Signed)
Oral temp 101.9-102.9 F , notified Dr. Trula Slade, no new order at this time. Tylenol given q 4 hr, pain managed with Morphine and Oxycodone, well tolerated, able to rest well.  Alert and oriented x 4, Pt complained having itching on his face and nose, no skin rash noted,  Bilateral lungs clear,no wheezing. Pt stated he usually has allergic reaction like this before. Benadryl given.  Spoke with Pt's wife, update given,all question answered. She appreciated for calling.   Surgical wound with original dressing was dry, clean and intact, no bleeding.   EKG was sinus rhythm and tachycardia on monitor, HR 90s-110s. BP stable, SPO2 97% with room air. No acute distress noted. Continue to monitor.  Kennyth Lose, RN

## 2019-03-04 NOTE — Care Management Important Message (Signed)
Important Message  Patient Details  Name: Rodney Sullivan MRN: [id number] Date of Birth: 12/20/1940   Medicare Important Message Given:  Yes     [name] 03/04/2019, 2:26 PM

## 2019-03-04 NOTE — Evaluation (Signed)
Physical Therapy Evaluation Patient Details Name: Rodney Sullivan MRN: [id number] DOB: 17-Aug-1940 Today's Date: 03/04/2019   History of Present Illness  Pt is a 78 year old man admitted on 03/01/19 with L foot gangrene. Underwent L BKA  on 03/03/19. PMH: PVD, DM, former smoker.  Clinical Impression  Pt admitted with above diagnosis. Pt was able to pivot to chair but had to drop the arm of the recliner and squat pivot as he could not advance the right LE using RW.  Has right shoulder issue and could not push on UE to hop with right LE.  Pt wife supportive. Feel Rehab  Is a good option for pt.   Pt currently with functional limitations due to the deficits listed below (see PT Problem List). Pt will benefit from skilled PT to increase their independence and safety with mobility to allow discharge to the venue listed below.      Follow Up Recommendations CIR;Supervision/Assistance - 24 hour    Equipment Recommendations  Other (comment)(TBA)    Recommendations for Other Services       Precautions / Restrictions Precautions Precautions: Fall Restrictions Weight Bearing Restrictions: Yes LLE Weight Bearing: Non weight bearing      Mobility  Bed Mobility Overal bed mobility: Needs Assistance Bed Mobility: Supine to Sit     Supine to sit: +2 for physical assistance;Mod assist     General bed mobility comments: cues for technique, assist to initiate, advance hips to EOB with bed pad and to raise trunk  Transfers Overall transfer level: Needs assistance   Transfers: Sit to/from Stand;Squat Pivot Transfers Sit to Stand: +2 physical assistance;Mod assist   Squat pivot transfers: +2 physical assistance;Mod assist     General transfer comment: assist to rise and steady, pt stood x 2 with RW, but unable to pivot on R foot in standing, removed walker and performed squat pivot toward R side  Ambulation/Gait                Stairs            Wheelchair Mobility     Modified Rankin (Stroke Patients Only)       Balance Overall balance assessment: Needs assistance Sitting-balance support: No upper extremity supported;Feet supported Sitting balance-Leahy Scale: Fair     Standing balance support: Bilateral upper extremity supported Standing balance-Leahy Scale: Poor Standing balance comment: Relies on UEs support for balance                             Pertinent Vitals/Pain Pain Assessment: Faces Faces Pain Scale: Hurts little more Pain Location: L LE Pain Descriptors / Indicators: Operative site guarding;Grimacing Pain Intervention(s): Limited activity within patient's tolerance;Monitored during session;Repositioned;Premedicated before session    Home Living Family/patient expects to be discharged to:: Private residence Living Arrangements: Spouse/significant other Available Help at Discharge: Family;Available 24 hours/day Type of Home: House Home Access: Ramped entrance     Home Layout: One level Home Equipment: Walker - 2 wheels;Walker - 4 wheels;Wheelchair - manual;Grab bars - toilet;Hand held shower head(needs a new shower seat )      Prior Function Level of Independence: Independent with assistive device(s)         Comments: walked with a rollator     Hand Dominance   Dominant Hand: Right    Extremity/Trunk Assessment   Upper Extremity Assessment Upper Extremity Assessment: Defer to OT evaluation RUE Deficits / Details: Per wife, pt only has "  one tendon" in his R shoulder.    Lower Extremity Assessment Lower Extremity Assessment: LLE deficits/detail LLE Deficits / Details: new BKA, NT due to pain    Cervical / Trunk Assessment Cervical / Trunk Assessment: Kyphotic  Communication   Communication: No difficulties  Cognition Arousal/Alertness: Lethargic;Suspect due to medications Behavior During Therapy: Flat affect Overall Cognitive Status: Difficult to assess                                         General Comments      Exercises General Exercises - Lower Extremity Quad Sets: AROM;Both;10 reps;Supine   Assessment/Plan    PT Assessment Patient needs continued PT services  PT Problem List Decreased activity tolerance;Decreased balance;Decreased mobility;Decreased knowledge of use of DME;Decreased safety awareness;Decreased knowledge of precautions       PT Treatment Interventions DME instruction;Gait training;Functional mobility training;Therapeutic activities;Therapeutic exercise;Balance training;Patient/family education;Wheelchair mobility training    PT Goals (Current goals can be found in the Care Plan section)  Acute Rehab PT Goals Patient Stated Goal: get stronger and eventually return home PT Goal Formulation: With patient/family Time For Goal Achievement: 03/18/19 Potential to Achieve Goals: Good    Frequency Min 3X/week   Barriers to discharge        Co-evaluation PT/OT/SLP Co-Evaluation/Treatment: Yes Reason for Co-Treatment: Complexity of the patient's impairments (multi-system involvement);For patient/therapist safety PT goals addressed during session: Mobility/safety with mobility OT goals addressed during session: ADL's and self-care       AM-PAC PT "6 Clicks" Mobility  Outcome Measure Help needed turning from your back to your side while in a flat bed without using bedrails?: A Lot Help needed moving from lying on your back to sitting on the side of a flat bed without using bedrails?: A Lot Help needed moving to and from a bed to a chair (including a wheelchair)?: A Lot Help needed standing up from a chair using your arms (e.g., wheelchair or bedside chair)?: A Lot Help needed to walk in hospital room?: Total Help needed climbing 3-5 steps with a railing? : Total 6 Click Score: 10    End of Session Equipment Utilized During Treatment: Gait belt Activity Tolerance: Patient limited by fatigue;Patient limited by lethargy Patient  left: in chair;with call bell/phone within reach;with chair alarm set;with family/visitor present Nurse Communication: Mobility status PT Visit Diagnosis: Unsteadiness on feet (R26.81);Muscle weakness (generalized) (M62.81)    Time: 9774-1423 PT Time Calculation (min) (ACUTE ONLY): 18 min   Charges:   PT Evaluation $PT Eval Moderate Complexity: [place] Pager:  [phone]  Office:  [phone]    Denice Paradise 03/04/2019, 2:03 PM

## 2019-03-04 NOTE — Progress Notes (Addendum)
[facility]  Subjective  - Doing well.   Objective (!) 144/77 (!) 102 98.2 F (36.8 C) (Oral) 20 93%  Intake/Output Summary (Last 24 hours) at 03/04/2019 0733 Last data filed at 03/04/2019 0300 Gross per 24 hour  Intake 797.66 ml  Output 250 ml  Net 547.66 ml    Left BKA dressing intact Lungs non labored breathing   Assessment/Planning: POD # 1 left BKA  Plan to change dressing tomorrow CIR consulted for rehab  [name] 03/04/2019 7:33 AM --  Laboratory Lab Results: Recent Labs    03/02/19 0243 03/04/19 0349  WBC 13.2* 17.4*  HGB 10.7* 10.9*  HCT 34.0* 35.6*  PLT 349 296   BMET Recent Labs    03/01/19 1510 03/04/19 0349  NA 140 140  K 4.1 3.4*  CL 104 105  CO2 23 24  GLUCOSE 239* 160*  BUN 12 7*  CREATININE 1.02 0.92  CALCIUM 8.6* 8.3*    COAG Lab Results  Component Value Date   INR 1.0 02/15/2019   INR 1.41 12/14/2013   INR 1.15 12/14/2013   No results found for: PTT   I have seen and evaluated the patient. I agree with the PA note as documented above. POD#1 s/p L BKA.  Pain well controlled.  Will change dressing tomorrow.  [name], MD [facility] Office: [phone] Pager: [phone]

## 2019-03-04 NOTE — Consult Note (Signed)
PV Navigator consult acknowledged and chart reviewed. Able to speak with patient's wife via telephone while she is still at patient's bedside. She reports that patient has received pain medication and is not answering questions coherently at this time due to being so sleepy from pain meds. Wife reports there are no specific needs or barriers at this time that I can help them with. She says patient worked with PT and OT very well this am and was evaluated for IP rehab and they are very excited about that possibility. PV Navigator contact information left with wife and encouraged her to call should barriers arise. I will continue to follow this patient as he transitions in care. Plan is to return to home once IP rehab completed. Thank you for this consult.  Cletis Media RN BSN C[facility] [place] [phone]

## 2019-03-05 LAB — GLUCOSE, CAPILLARY
Glucose-Capillary: 140 mg/dL — ABNORMAL HIGH (ref 70–99)
Glucose-Capillary: 190 mg/dL — ABNORMAL HIGH (ref 70–99)
Glucose-Capillary: 249 mg/dL — ABNORMAL HIGH (ref 70–99)
Glucose-Capillary: 218 mg/dL — ABNORMAL HIGH (ref 70–99)

## 2019-03-05 LAB — BASIC METABOLIC PANEL
Anion gap: 11 (ref 5–15)
BUN: 16 mg/dL (ref 8–23)
CO2: 26 mmol/L (ref 22–32)
Calcium: 8.6 mg/dL — ABNORMAL LOW (ref 8.9–10.3)
Chloride: 104 mmol/L (ref 98–111)
Creatinine, Ser: 1.01 mg/dL (ref 0.61–1.24)
GFR calc Af Amer: >60 mL/min (ref >60)
GFR calc non Af Amer: >60 mL/min (ref >60)
Glucose, Bld: 157 mg/dL — ABNORMAL HIGH (ref 70–99)
Potassium: 3.7 mmol/L (ref 3.5–5.1)
Sodium: 141 mmol/L (ref 135–145)

## 2019-03-05 LAB — CBC
HCT: 33.2 % — ABNORMAL LOW (ref 39.0–52.0)
Hemoglobin: 10.4 g/dL — ABNORMAL LOW (ref 13.0–17.0)
MCH: 28.2 pg (ref 26.0–34.0)
MCHC: 31.3 g/dL (ref 30.0–36.0)
MCV: 90 fL (ref 80.0–100.0)
Platelets: 258 10*3/uL (ref 150–400)
RBC: 3.69 MIL/uL — ABNORMAL LOW (ref 4.22–5.81)
RDW: 13.9 % (ref 11.5–15.5)
WBC: 17.5 10*3/uL — ABNORMAL HIGH (ref 4.0–10.5)
nRBC: 0 % (ref 0.0–0.2)

## 2019-03-05 NOTE — Progress Notes (Addendum)
Note entered in error

## 2019-03-05 NOTE — Progress Notes (Addendum)
  Progress Note    03/05/2019 8:16 AM 2 Days Post-Op  Subjective:  Minimal pain overnight   Vitals:   03/04/19 2352 03/05/19 0447  BP:  129/73  Pulse: 95 88  Resp: 18 (!) 21  Temp:  98.2 F (36.8 C)  SpO2: 97% 95%    Physical Exam: Incisions:  L BKA incision some serosanguinous collection on dressing; skin edges viable   CBC    Component Value Date/Time   WBC 17.5 (H) 03/05/2019 0423   RBC 3.69 (L) 03/05/2019 0423   HGB 10.4 (L) 03/05/2019 0423   HCT 33.2 (L) 03/05/2019 0423   PLT 258 03/05/2019 0423   MCV 90.0 03/05/2019 0423   MCH 28.2 03/05/2019 0423   MCHC 31.3 03/05/2019 0423   RDW 13.9 03/05/2019 0423   LYMPHSABS 2.1 03/01/2019 1158   MONOABS 1.6 (H) 03/01/2019 1158   EOSABS 0.2 03/01/2019 1158   BASOSABS 0.1 03/01/2019 1158    BMET    Component Value Date/Time   NA 141 03/05/2019 0423   K 3.7 03/05/2019 0423   CL 104 03/05/2019 0423   CO2 26 03/05/2019 0423   GLUCOSE 157 (H) 03/05/2019 0423   BUN 16 03/05/2019 0423   CREATININE 1.01 03/05/2019 0423   CALCIUM 8.6 (L) 03/05/2019 0423   GFRNONAA >60 03/05/2019 0423   GFRAA >60 03/05/2019 0423    INR    Component Value Date/Time   INR 1.0 02/15/2019 0110     Intake/Output Summary (Last 24 hours) at 03/05/2019 0816 Last data filed at 03/05/2019 0447 Gross per 24 hour  Intake -  Output 115 ml  Net -115 ml     Assessment/Plan:  78 y.o. male is s/p left below knee amputation  2 Days Post-Op  -Dressing changed today; no active drainage but old dressing was saturated - d/c ACE and order stump sock tomorrow if dry -PT/OT/CIR    Dagoberto Ligas, PA-C [facility] [phone] 03/05/2019 8:16 AM  I have seen and evaluated the patient. I agree with the PA note as documented above. POD#2 s/p L BKA.  Looks viable and healthy.  PT recommends CIR.  [name], MD [facility] Office: [phone] Pager: [phone]

## 2019-03-05 NOTE — Progress Notes (Signed)
PA Gwenette Greet called concerning patient lethargy. Wife concerned that he is too sleepy. Talked about residual medications from surgery, morphine and oxycodone given to patient. Patient is arousable and able to state name and birth date. Pt is somewhat irritable that we keep attempting to awaken him. Pt given no other pain medications until more awake. Wife states that she had asked he get morphine previous evening as she did not want him to hurt. New pain regime discussed and will not get morphine unless severe pain. Pt resting with call bell within reach.  Will continue to monitor.

## 2019-03-05 NOTE — Progress Notes (Signed)
Inpatient Rehab Admissions Coordinator:    Met with pt and wife at bedside to answer questions re: rehab program.  They continue to be interested in CIR level therapy.  Await determination from insurance on prior authorization.   Shann Medal, PT, DPT Admissions Coordinator [phone] 03/05/19  11:32 AM

## 2019-03-05 NOTE — Progress Notes (Signed)
CCMD called stating patient had a 16 beat run of V Tach. Went to check on patient. Patient in bed, reading his book, asymptomatic. Will continue to monitor.   Tawanna Sat, RN 03/05/2019  4:34 AM

## 2019-03-05 NOTE — Progress Notes (Signed)
Physical Therapy Treatment Patient Details Name: Rodney Sullivan MRN: [id number] DOB: 1941/02/07 Today's Date: 03/05/2019    History of Present Illness Pt is a 78 year old man admitted on 03/01/19 with L foot gangrene. Underwent L BKA  on 03/03/19. PMH: PVD, DM, former smoker.    PT Comments    Pt admitted with above diagnosis. Pt was able to stand to [facility] with +2 min assist and mod cues to be cleaned after using 3N1.  Stood 2 min with min guard assist with UE support in [place]. Moved pt to bed and pt was able to perform LE exercises with BKA.  Will continue to progress pt and pt participatory and awake and alert today.   Pt currently with functional limitations due to balance and endurance deficits. Pt will benefit from skilled PT to increase their independence and safety with mobility to allow discharge to the venue listed below.     Follow Up Recommendations  CIR;Supervision/Assistance - 24 hour     Equipment Recommendations  Other (comment)(TBA)    Recommendations for Other Services       Precautions / Restrictions Precautions Precautions: Fall Restrictions Weight Bearing Restrictions: Yes LLE Weight Bearing: Non weight bearing    Mobility  Bed Mobility Overal bed mobility: Needs Assistance Bed Mobility: Sit to Supine       Sit to supine: Min assist   General bed mobility comments: cues for technique and a little assist for trunk control  Transfers Overall transfer level: Needs assistance   Transfers: Sit to/from Stand Sit to Stand: Min assist;+2 safety/equipment         General transfer comment: Pt was on the 3n1 on arrival.  Pt needed min assist to rise and steady, pt stood x 2 with Stedy.  Able to stand fully upright for up to 2 min to be cleaned.  Assisted back to bed with [facility] as he had been up for 3 hours per wife.    Ambulation/Gait                 Stairs             Wheelchair Mobility    Modified Rankin (Stroke Patients Only)       Balance Overall balance assessment: Needs assistance Sitting-balance support: No upper extremity supported;Feet supported Sitting balance-Leahy Scale: Fair     Standing balance support: Bilateral upper extremity supported Standing balance-Leahy Scale: Poor Standing balance comment: Relies on UEs support for balance                            Cognition Arousal/Alertness: Awake/alert Behavior During Therapy: WFL for tasks assessed/performed Overall Cognitive Status: Within Functional Limits for tasks assessed                                        Exercises General Exercises - Lower Extremity Quad Sets: AROM;Both;10 reps;Supine Gluteal Sets: AROM;Both;10 reps;Supine Short Arc Quad: AROM;Left;10 reps;Supine Heel Slides: AROM;Left;10 reps;Supine Hip ABduction/ADduction: AROM;Left;10 reps;Supine Straight Leg Raises: AROM;Left;10 reps;Supine Other Exercises Other Exercises: hip sidelying abduction and prone extension on exercise program but pt did not perform yet. Exercise program handout [id number] given to pt.    General Comments        Pertinent Vitals/Pain Pain Assessment: No/denies pain    Home Living  Prior Function            PT Goals (current goals can now be found in the care plan section) Acute Rehab PT Goals Patient Stated Goal: get stronger and eventually return home Progress towards PT goals: Progressing toward goals    Frequency    Min 3X/week      PT Plan Current plan remains appropriate    Co-evaluation              AM-PAC PT "6 Clicks" Mobility   Outcome Measure  Help needed turning from your back to your side while in a flat bed without using bedrails?: A Lot Help needed moving from lying on your back to sitting on the side of a flat bed without using bedrails?: A Lot Help needed moving to and from a bed to a chair (including a wheelchair)?: A Lot Help needed standing up from a  chair using your arms (e.g., wheelchair or bedside chair)?: A Lot Help needed to walk in hospital room?: Total Help needed climbing 3-5 steps with a railing? : Total 6 Click Score: 10    End of Session Equipment Utilized During Treatment: Gait belt(Stedy) Activity Tolerance: Patient tolerated treatment well Patient left: with call bell/phone within reach;with family/visitor present;in bed Nurse Communication: Mobility status PT Visit Diagnosis: Unsteadiness on feet (R26.81);Muscle weakness (generalized) (M62.81)     Time: 5409-8119 PT Time Calculation (min) (ACUTE ONLY): 30 min  Charges:  $Therapeutic Exercise: 8-22 mins $Therapeutic Activity: 8-22 mins                     [place] Pager:  [phone]  Office:  [phone]     Denice Paradise 03/05/2019, 3:22 PM

## 2019-03-06 LAB — GLUCOSE, CAPILLARY
Glucose-Capillary: 125 mg/dL — ABNORMAL HIGH (ref 70–99)
Glucose-Capillary: 280 mg/dL — ABNORMAL HIGH (ref 70–99)
Glucose-Capillary: 223 mg/dL — ABNORMAL HIGH (ref 70–99)
Glucose-Capillary: 256 mg/dL — ABNORMAL HIGH (ref 70–99)

## 2019-03-06 MED ORDER — TRAMADOL HCL 50 MG PO TABS
50.0000 mg | ORAL_TABLET | Freq: Every evening | ORAL | Status: DC | PRN
  Administered 2019-03-06 – 2019-03-10 (×6): 50 mg via ORAL
  Filled 2019-03-06 (×6): qty 1

## 2019-03-06 MED ORDER — PHENYLEPHRINE HCL-NACL 10-0.9 MG/250ML-% IV SOLN
INTRAVENOUS | Status: AC
  Filled 2019-03-06: qty 500

## 2019-03-06 NOTE — Discharge Summary (Addendum)
Discharge Summary    Rodney Sullivan 13-Aug-1940 78 y.o. male  [id number]  Admission Date: 03/01/2019  Discharge Date: 03/08/2019  Physician: Elam Dutch, MD  Admission Diagnosis: Peripheral arterial disease ([place]) [I73.9] Gangrene of foot ([place]) [I96]   HPI:   This is a 78 y.o. male deviously seen by Dr. Donzetta Matters several weeks ago.  At that point he had a wound on his right foot.  Abdominal aortogram lower extremity runoff was performed and showed inline flow via the posterior tibial artery.  He then subsequently had an intervention on his left posterior tibial artery by Dr. Carlis Abbott about 2 weeks after that.  Patient had fairly significant wound on his left foot at that point.  Despite revascularization of his left lower extremity the left foot has become worse with foul smell and more darkened discoloration.  The patient does not really have any pain in the foot but has significant neuropathy.  Other medical problems include diabetes and Crohn's disease.  Both of which are currently stable.  Patient is on Plavix aspirin and a statin.  Hospital Course:  The patient was admitted to the hospital and taken to the operating room on 03/03/2019 and underwent: Left below knee amputation.  Findings: Left below-knee amputation successfully performed.  Proximal tissue margins appeared healthy.  His skin and fascia was very thin and friable.    The pt tolerated the procedure well and was transported to the PACU in good condition.   Post operatively he has done well.  He has had some drainage on his bandage that will continue to be monitored as well as some ecchymosis on the posterior flap medially.    Tramadol was added for pain control.    The remainder of the hospital course consisted of increasing mobilization and increasing intake of solids without difficulty.  CBC    Component Value Date/Time   WBC 17.5 (H) 03/05/2019 0423   RBC 3.69 (L) 03/05/2019 0423   HGB 10.4 (L) 03/05/2019  0423   HCT 33.2 (L) 03/05/2019 0423   PLT 258 03/05/2019 0423   MCV 90.0 03/05/2019 0423   MCH 28.2 03/05/2019 0423   MCHC 31.3 03/05/2019 0423   RDW 13.9 03/05/2019 0423   LYMPHSABS 2.1 03/01/2019 1158   MONOABS 1.6 (H) 03/01/2019 1158   EOSABS 0.2 03/01/2019 1158   BASOSABS 0.1 03/01/2019 1158    BMET    Component Value Date/Time   NA 141 03/05/2019 0423   K 3.7 03/05/2019 0423   CL 104 03/05/2019 0423   CO2 26 03/05/2019 0423   GLUCOSE 157 (H) 03/05/2019 0423   BUN 16 03/05/2019 0423   CREATININE 1.01 03/05/2019 0423   CALCIUM 8.6 (L) 03/05/2019 0423   GFRNONAA >60 03/05/2019 0423   GFRAA >60 03/05/2019 0423      Discharge Instructions    Discharge patient   Complete by: As directed    Dc to CIR when bed available.   Discharge disposition: [place] Not Defined   Discharge patient date: 03/06/2019      Discharge Diagnosis:  Peripheral arterial disease ([place]) [I73.9] Gangrene of foot ([place]) [I96]  Secondary Diagnosis: Patient Active Problem List   Diagnosis Date Noted  . Gangrene of left foot ([place]) 03/01/2019  . Sepsis ([place]) 02/15/2019  . Anemia 02/15/2019  . Cellulitis and abscess of toe of left foot 02/15/2019  . Peripheral arterial disease ([place]) 02/15/2019  . DM (diabetes mellitus), type 2 with peripheral vascular complications ([place]) 53/29/9242  . Gout  02/15/2019  . Septic shock ([place]) 12/14/2013  . Pulmonary embolism ([place]) 12/14/2013  . Crohn disease ([place]) 12/14/2013   Past Medical History:  Diagnosis Date  . Bacteremia due to Escherichia coli 11/2013  . Crohn disease ([place])   . Diabetes mellitus without complication ([place])   . DM (diabetes mellitus), type 2 with peripheral vascular complications ([place]) 8/81/1031  . DVT (deep venous thrombosis) ([place]) 11/27/13  . History of skin cancer    ARMS & FACE  . Peripheral arterial disease ([place]) 02/15/2019  . Pulmonary embolism ([place]) 11/27/13      Allergies as of 03/07/2019       Reactions   Ciprofloxacin Other (See Comments)   tendons hurt   Shrimp [shellfish Allergy]       Medication List    STOP taking these medications   cephALEXin 500 MG capsule Commonly known as: KEFLEX     TAKE these medications   albuterol 108 (90 Base) MCG/ACT inhaler Commonly known as: VENTOLIN HFA Inhale 2 puffs into the lungs every 4 (four) hours as needed for wheezing or shortness of breath.   allopurinol 100 MG tablet Commonly known as: ZYLOPRIM Take 100 mg by mouth daily.   amLODipine 2.5 MG tablet Commonly known as: NORVASC Take 2.5 mg by mouth daily with supper.   aspirin EC 81 MG tablet Take 81 mg by mouth daily with supper.   B-12 2500 MCG Tabs Take 2,500 mcg by mouth daily.   benzonatate 100 MG capsule Commonly known as: TESSALON Take 100 mg by mouth 3 (three) times daily as needed for cough.   Cimzia 2 X 200 MG Kit Generic drug: Certolizumab Pegol Inject 400 mg into the skin every 28 (twenty-eight) days.   clopidogrel 75 MG tablet Commonly known as: PLAVIX Take 1 tablet (75 mg total) by mouth daily with breakfast.   clotrimazole-betamethasone cream Commonly known as: LOTRISONE Apply 1 application topically 2 (two) times daily as needed (fungus). On the side of mouth   Combigan 0.2-0.5 % ophthalmic solution Generic drug: brimonidine-timolol Place 1 drop into both eyes 2 (two) times a day.   cyclobenzaprine 10 MG tablet Commonly known as: FLEXERIL Take 10 mg by mouth daily as needed for muscle spasms.   Dexamethasone Acetate 8 MG/ML Susp Inject 8 mg into the muscle daily as needed (for Crohn's flare).   diphenhydrAMINE 25 MG tablet Commonly known as: BENADRYL Take 25 mg by mouth 2 (two) times daily as needed for allergies or sleep.   famciclovir 250 MG tablet Commonly known as: FAMVIR Take 250 mg by mouth daily as needed (fungus on corner of mouth flare).   ferrous sulfate 325 (65 FE) MG tablet Take 325 mg by mouth daily.   Fish Oil 1000  MG Caps Take 1,000 mg by mouth daily.   ketoconazole 200 MG tablet Commonly known as: NIZORAL Take 200 mg by mouth daily as needed (yeast infection).   latanoprost 0.005 % ophthalmic solution Commonly known as: XALATAN Place 1 drop into both eyes at bedtime.   mesalamine 500 MG CR capsule Commonly known as: PENTASA Take 2,000 mg by mouth 2 (two) times daily.   multivitamin with minerals Tabs tablet Take 1 tablet by mouth daily.   OVER THE COUNTER MEDICATION Take 5 mLs by mouth 2 (two) times daily. CBD oil drops   pantoprazole 40 MG tablet Commonly known as: PROTONIX Take 40 mg by mouth daily at 3 pm.   pioglitazone 15 MG tablet Commonly known as: ACTOS Take 15  mg by mouth daily.   pravastatin 10 MG tablet Commonly known as: PRAVACHOL Take 1 tablet (10 mg total) by mouth every evening.   predniSONE 10 MG tablet Commonly known as: DELTASONE Take 22.5 mg by mouth daily after breakfast.   traMADol 50 MG tablet Commonly known as: ULTRAM Take 1 tablet (50 mg total) by mouth at bedtime as needed for moderate pain.   Vitamin D 50 MCG (2000 UT) tablet Take 2,000 Units by mouth daily.   Zims Crack Creme Crea Apply 1 application topically daily as needed (dry skin).       Prescriptions given: none  Instructions: 1.  Shower daily with soap and water  Disposition: CIR  Patient's condition: is Good  Follow up: 1. Dr. Carlis Abbott in 4 weeks   Leontine Locket, PA-C [facility] [phone] 03/07/2019  8:46 AM

## 2019-03-06 NOTE — Progress Notes (Addendum)
  Progress Note VASCULAR SURGERY ASSESSMENT & PLAN:   POSTOP DAY 3 STATUS POST LEFT BKA: So far the stump is healing adequately as documented below with some ecchymosis on the medial aspect of it this.  For CIR.  DVT PROPHYLAXIS: He is on Lovenox.  Deitra Mayo, MD, FACS [facility] [phone] Office: [phone]   03/06/2019 5:41 AM 3 Days Post-Op  Subjective:  Says he had some pain earlier this morning.  Tm 99.6  Vitals:   03/05/19 2022 03/06/19 0416  BP: 121/74 96/62  Pulse: 87 100  Resp: (!) 21 15  Temp: 98.2 F (36.8 C) 99.6 F (37.6 C)  SpO2: 99% 94%    Physical Exam: Incisions:  Clean with staples in tact.  Some drainage on the bandage.  There is some ecchymosis medially.      CBC    Component Value Date/Time   WBC 17.5 (H) 03/05/2019 0423   RBC 3.69 (L) 03/05/2019 0423   HGB 10.4 (L) 03/05/2019 0423   HCT 33.2 (L) 03/05/2019 0423   PLT 258 03/05/2019 0423   MCV 90.0 03/05/2019 0423   MCH 28.2 03/05/2019 0423   MCHC 31.3 03/05/2019 0423   RDW 13.9 03/05/2019 0423   LYMPHSABS 2.1 03/01/2019 1158   MONOABS 1.6 (H) 03/01/2019 1158   EOSABS 0.2 03/01/2019 1158   BASOSABS 0.1 03/01/2019 1158    BMET    Component Value Date/Time   NA 141 03/05/2019 0423   K 3.7 03/05/2019 0423   CL 104 03/05/2019 0423   CO2 26 03/05/2019 0423   GLUCOSE 157 (H) 03/05/2019 0423   BUN 16 03/05/2019 0423   CREATININE 1.01 03/05/2019 0423   CALCIUM 8.6 (L) 03/05/2019 0423   GFRNONAA >60 03/05/2019 0423   GFRAA >60 03/05/2019 0423    INR    Component Value Date/Time   INR 1.0 02/15/2019 0110    No intake or output data in the 24 hours ending 03/06/19 0541   Assessment/Plan:  78 y.o. male is s/p left below knee amputation  3 Days Post-Op  -pt doing well this morning -there continues to be some drainage on his bandage and kerlix and ace wrap placed.  Ecchymosis medial posterior flap-continue to monitor.  -ok for CIR insurance approval.    Leontine Locket,  PA-C [facility] [phone] 03/06/2019 5:41 AM

## 2019-03-06 NOTE — Plan of Care (Signed)

## 2019-03-07 LAB — GLUCOSE, CAPILLARY
Glucose-Capillary: 117 mg/dL — ABNORMAL HIGH (ref 70–99)
Glucose-Capillary: 150 mg/dL — ABNORMAL HIGH (ref 70–99)
Glucose-Capillary: 260 mg/dL — ABNORMAL HIGH (ref 70–99)
Glucose-Capillary: 191 mg/dL — ABNORMAL HIGH (ref 70–99)

## 2019-03-07 MED ORDER — TRAMADOL HCL 50 MG PO TABS: 50.0000 mg | ORAL_TABLET | Freq: Every evening | ORAL | Status: AC | PRN

## 2019-03-07 NOTE — Progress Notes (Addendum)
  Progress Note    03/07/2019 8:43 AM 4 Days Post-Op  Subjective:  No complaints; waiting for CIR  Afebrile  Vitals:   03/06/19 2159 03/07/19 0628  BP: 131/71 135/74  Pulse: 97 85  Resp: (!) 21 (!) 21  Temp: 98.1 F (36.7 C) 98.1 F (36.7 C)  SpO2: 99% 97%    Physical Exam: Incisions:  Bandage in place and clean and dry   CBC    Component Value Date/Time   WBC 17.5 (H) 03/05/2019 0423   RBC 3.69 (L) 03/05/2019 0423   HGB 10.4 (L) 03/05/2019 0423   HCT 33.2 (L) 03/05/2019 0423   PLT 258 03/05/2019 0423   MCV 90.0 03/05/2019 0423   MCH 28.2 03/05/2019 0423   MCHC 31.3 03/05/2019 0423   RDW 13.9 03/05/2019 0423   LYMPHSABS 2.1 03/01/2019 1158   MONOABS 1.6 (H) 03/01/2019 1158   EOSABS 0.2 03/01/2019 1158   BASOSABS 0.1 03/01/2019 1158    BMET    Component Value Date/Time   NA 141 03/05/2019 0423   K 3.7 03/05/2019 0423   CL 104 03/05/2019 0423   CO2 26 03/05/2019 0423   GLUCOSE 157 (H) 03/05/2019 0423   BUN 16 03/05/2019 0423   CREATININE 1.01 03/05/2019 0423   CALCIUM 8.6 (L) 03/05/2019 0423   GFRNONAA >60 03/05/2019 0423   GFRAA >60 03/05/2019 0423    INR    Component Value Date/Time   INR 1.0 02/15/2019 0110     Intake/Output Summary (Last 24 hours) at 03/07/2019 0843 Last data filed at 03/06/2019 1400 Gross per 24 hour  Intake 720 ml  Output 250 ml  Net 470 ml     Assessment/Plan:  78 y.o. male is s/p left below knee amputation  4 Days Post-Op  -pt doing well this morning.   -awaiting CIR insurance approval. Ok to discharge to CIR when available.  -Tramadol added to medication list for pain control.  If pt does not go to CIR, he will need Rx for this at discharge.   Leontine Locket, PA-C [facility] [phone] 03/07/2019 8:43 AM  I have interviewed the patient and examined the patient. I agree with the findings by the PA.  I spoke with his wife yesterday.  She says he takes Ultram at night to help with some GI  issues.  He tells me that the dose last night did help.  Awaiting CIR.  Gae Gallop, MD [phone]

## 2019-03-07 NOTE — Plan of Care (Signed)

## 2019-03-08 LAB — GLUCOSE, CAPILLARY
Glucose-Capillary: 163 mg/dL — ABNORMAL HIGH (ref 70–99)
Glucose-Capillary: 163 mg/dL — ABNORMAL HIGH (ref 70–99)
Glucose-Capillary: 265 mg/dL — ABNORMAL HIGH (ref 70–99)
Glucose-Capillary: 147 mg/dL — ABNORMAL HIGH (ref 70–99)

## 2019-03-08 LAB — CREATININE, SERUM
Creatinine, Ser: 0.83 mg/dL (ref 0.61–1.24)
GFR calc Af Amer: >60 mL/min (ref >60)
GFR calc non Af Amer: >60 mL/min (ref >60)

## 2019-03-08 MED ORDER — PHENYLEPHRINE HCL-NACL 10-0.9 MG/250ML-% IV SOLN
INTRAVENOUS | Status: AC
  Filled 2019-03-08: qty 2500

## 2019-03-08 NOTE — Care Management Important Message (Signed)
Important Message  Patient Details  Name: Rodney Sullivan MRN: [id number] Date of Birth: [birth year]   Medicare Important Message Given:  Yes     [name] 03/08/2019, 1:35 PM

## 2019-03-08 NOTE — Progress Notes (Signed)
Inpatient Rehab Admissions Coordinator:   I have not received an update from pt's insurance company re: prior authorization.  Will follow up tomorrow.   Shann Medal, PT, DPT Admissions Coordinator [phone] 03/08/19  4:46 PM

## 2019-03-08 NOTE — Progress Notes (Addendum)
[facility] [place]  Subjective  - Doing well over with good pain control.   Objective 111/66 72 97.6 F (36.4 C) (Oral) 15 97%  Intake/Output Summary (Last 24 hours) at 03/08/2019 0741 Last data filed at 03/07/2019 1400 Gross per 24 hour  Intake 720 ml  Output 75 ml  Net 645 ml    Right BKA has dusky lateral posterior stump skin changes, slightly cool to touch. Lungs non labored breathing   Assessment/Planning: POD # 5 s/p left below knee amputation  Pending CIR awaiting insurance  [name] 03/08/2019 7:41 AM --  Laboratory Lab Results: No results for input(s): WBC, HGB, HCT, PLT in the last 72 hours. BMET Recent Labs    03/08/19 0324  CREATININE 0.83    COAG Lab Results  Component Value Date   INR 1.0 02/15/2019   INR 1.41 12/14/2013   INR 1.15 12/14/2013   No results found for: PTT   I have seen and evaluated the patient. I agree with the PA note as documented above. POD#5 s/p L BKA.  Dressing changed this am - some bruising to medial stump.  OK for CIR and will continue to monitor.  Hopefully this resolves with time and we can monitor in CIR.  [name], MD [facility] [place] Office: [phone] Pager: [phone]

## 2019-03-08 NOTE — Progress Notes (Signed)
Inpatient Rehab Admissions Coordinator:   Received notification from pt's [facility] that they have initially denied request for inpatient rehab authorization.  Dr. Carlis Abbott to do peer to peer to attempt to overturn denial.  I have let pt/family know. Will follow.   Shann Medal, PT, DPT Admissions Coordinator [phone] 03/08/19  11:14 AM

## 2019-03-08 NOTE — Progress Notes (Signed)
Physical Therapy Treatment Patient Details Name: Rodney Sullivan MRN: [id number] DOB: [birth year] Today's Date: 03/08/2019    History of Present Illness Pt is a 78 year old man admitted on 03/01/19 with L foot gangrene. Underwent L BKA  on 03/03/19. PMH: PVD, DM, former smoker.    PT Comments    Pt admitted with above diagnosis. Pt was able to stand and pivot to recliner with mod assist of 1 with RW.  PRacticed sit to stand with some improvements with repetition.  NEeds Rehab and is a great candidate!  Will follow acutely.  Pt currently with functional limitations due to balance and endurance deficits. Pt will benefit from skilled PT to increase their independence and safety with mobility to allow discharge to the venue listed below.     Follow Up Recommendations  CIR;Supervision/Assistance - 24 hour     Equipment Recommendations  Other (comment)(TBA)    Recommendations for Other Services       Precautions / Restrictions Precautions Precautions: Fall Restrictions Weight Bearing Restrictions: Yes LLE Weight Bearing: Non weight bearing    Mobility  Bed Mobility               General bed mobility comments: on 3N1 on arrival  Transfers Overall transfer level: Needs assistance Equipment used: Rolling walker (2 wheeled) Transfers: Sit to/from [facility] Sit to Stand: Mod assist;Min assist Stand pivot transfers: Mod assist;Min assist       General transfer comment: Pt was on the 3n1 on arrival.  Pt able to stand from 3N1 with min assist and cues with steadying assist needed.  Pivoted to reciner with RW with mod to min assist as he fatigued by the time he was close to recliner.  Needed assist to control descent into chair.  Pt rested and then performed 4 sit to stands initially needing mod assist to stand but progressing to needing min assist to stand.  Also practiced taking a step forward and back.  His descent to chair imrpoved with practi ce as well.     Ambulation/Gait                 Stairs             Wheelchair Mobility    Modified Rankin (Stroke Patients Only)       Balance Overall balance assessment: Needs assistance Sitting-balance support: No upper extremity supported;Feet supported Sitting balance-Leahy Scale: Fair     Standing balance support: Bilateral upper extremity supported Standing balance-Leahy Scale: Poor Standing balance comment: Relies on UEs support for balance                            Cognition Arousal/Alertness: Awake/alert Behavior During Therapy: WFL for tasks assessed/performed Overall Cognitive Status: Within Functional Limits for tasks assessed                                        Exercises General Exercises - Lower Extremity Quad Sets: AROM;Both;10 reps;Supine Long Arc Quad: AROM;Both;10 reps;Seated Hip Flexion/Marching: AROM;Both;10 reps;Seated    General Comments        Pertinent Vitals/Pain Pain Assessment: No/denies pain    Home Living                      Prior Function  PT Goals (current goals can now be found in the care plan section) Acute Rehab PT Goals Patient Stated Goal: get stronger and eventually return home Progress towards PT goals: Progressing toward goals    Frequency    Min 3X/week      PT Plan Current plan remains appropriate    Co-evaluation              AM-PAC PT "6 Clicks" Mobility   Outcome Measure  Help needed turning from your back to your side while in a flat bed without using bedrails?: A Lot Help needed moving from lying on your back to sitting on the side of a flat bed without using bedrails?: A Lot Help needed moving to and from a bed to a chair (including a wheelchair)?: A Lot Help needed standing up from a chair using your arms (e.g., wheelchair or bedside chair)?: A Lot Help needed to walk in hospital room?: A Lot Help needed climbing 3-5 steps with a railing?  : Total 6 Click Score: 11    End of Session Equipment Utilized During Treatment: Gait belt Activity Tolerance: Patient tolerated treatment well Patient left: with call bell/phone within reach;with family/visitor present;in chair Nurse Communication: Mobility status PT Visit Diagnosis: Unsteadiness on feet (R26.81);Muscle weakness (generalized) (M62.81)     Time: 3903-0092 PT Time Calculation (min) (ACUTE ONLY): 21 min  Charges:  $Therapeutic Activity: 8-22 mins                     [place] Pager:  [phone]  Office:  [place] 03/08/2019, 11:28 AM

## 2019-03-09 ENCOUNTER — Telehealth: Payer: Self-pay | Admitting: Vascular Surgery

## 2019-03-09 LAB — GLUCOSE, CAPILLARY
Glucose-Capillary: 133 mg/dL — ABNORMAL HIGH (ref 70–99)
Glucose-Capillary: 209 mg/dL — ABNORMAL HIGH (ref 70–99)
Glucose-Capillary: 249 mg/dL — ABNORMAL HIGH (ref 70–99)
Glucose-Capillary: 178 mg/dL — ABNORMAL HIGH (ref 70–99)

## 2019-03-09 MED ORDER — PHENYLEPHRINE HCL-NACL 10-0.9 MG/250ML-% IV SOLN
INTRAVENOUS | Status: AC
  Filled 2019-03-09: qty 250

## 2019-03-09 MED ORDER — PROPOFOL 1000 MG/100ML IV EMUL
INTRAVENOUS | Status: AC
  Filled 2019-03-09: qty 100

## 2019-03-09 NOTE — Telephone Encounter (Signed)
Spoke with patient and his spouse Rodney Sullivan about his post op appt on 04/06/19 @ 8:20 am mailed [place]

## 2019-03-09 NOTE — Progress Notes (Signed)
Occupational Therapy Treatment Patient Details Name: Rodney Sullivan MRN: [id number] DOB: 1941/03/02 Today's Date: 03/09/2019    History of present illness Pt is a 78 year old man admitted on 03/01/19 with L foot gangrene. Underwent L BKA  on 03/03/19. PMH: PVD, DM, former smoker.   OT comments  Pt progressing toward established goals. Pt currently requires modA for LB dressing, pt educated on residual limb wrapping, setupA for UB bathing/grooming. Pt required minA-modA for functional mobility at RW level. Pt will continue to benefit from skilled OT services to maximize safety and independence with ADL/IADL and functional mobility. Will continue to follow acutely and progress as tolerated.      Follow Up Recommendations  CIR    Equipment Recommendations  3 in 1 bedside commode;Tub/shower seat    Recommendations for Other Services      Precautions / Restrictions Precautions Precautions: Fall Restrictions Weight Bearing Restrictions: Yes LLE Weight Bearing: Non weight bearing       Mobility Bed Mobility Overal bed mobility: Needs Assistance Bed Mobility: Supine to Sit     Supine to sit: Min guard     General bed mobility comments: minguard for safety  Transfers Overall transfer level: Needs assistance Equipment used: Rolling walker (2 wheeled) Transfers: Sit to/from [facility] Sit to Stand: Min assist;Mod assist Stand pivot transfers: Min assist       General transfer comment: min-modA for powerup and controlled descent into chair;stood 4x with improvement in descent progressing to minA;    Balance Overall balance assessment: Needs assistance Sitting-balance support: No upper extremity supported;Feet supported Sitting balance-Leahy Scale: Fair     Standing balance support: Single extremity supported;During functional activity Standing balance-Leahy Scale: Poor Standing balance comment: able to tolerate single UE support while standing with  min-modA for stability as pt reached outside base of support                           ADL either performed or assessed with clinical judgement   ADL Overall ADL's : Needs assistance/impaired         Upper Body Bathing: Set up;Sitting Upper Body Bathing Details (indicate cue type and reason): pt completed bathing while sitting EOB after setupA Lower Body Bathing: Minimal assistance;Sit to/from stand   Upper Body Dressing : Set up;Sitting   Lower Body Dressing: Minimal assistance;Sit to/from stand   Toilet Transfer: Minimal assistance;RW;Moderate assistance Armed forces technical officer Details (indicate cue type and reason): simulated in room, pt took 5 steps to transfer from EOB to recliner;moda for control descend Toileting- Clothing Manipulation and Hygiene: Moderate assistance;Minimal assistance;Sit to/from stand       Functional mobility during ADLs: Minimal assistance;Rolling walker General ADL Comments: educated pt on compensatory strategies for LB dressing;educated pt on importance of and technique of residual limb wrapping;     Vision       Perception     Praxis      Cognition Arousal/Alertness: Awake/alert Behavior During Therapy: WFL for tasks assessed/performed Overall Cognitive Status: Within Functional Limits for tasks assessed                                          Exercises Exercises: Other exercises Other Exercises Other Exercises: chair pushups 10x Other Exercises: scapular retraction hold 5sec 10x/ 3x/day   Shoulder Instructions       General Comments HR  briefly tachy up to 121 with ambulation to recliner    Pertinent Vitals/ Pain       Pain Assessment: 0-10 Pain Score: 2  Pain Location: L LE Pain Descriptors / Indicators: Operative site guarding;Grimacing Pain Intervention(s): Limited activity within patient's tolerance;Monitored during session  Home Living                                           Prior Functioning/Environment              Frequency  Min 2X/week        Progress Toward Goals  OT Goals(current goals can now be found in the care plan section)  Progress towards OT goals: Progressing toward goals  Acute Rehab OT Goals Patient Stated Goal: get stronger and eventually return home OT Goal Formulation: With patient/family Time For Goal Achievement: 03/18/19 Potential to Achieve Goals: Good ADL Goals Pt Will Perform Lower Body Bathing: with supervision;sitting/lateral leans;with adaptive equipment Pt Will Perform Lower Body Dressing: with supervision;with adaptive equipment;sit to/from stand Pt Will Transfer to Toilet: with supervision;stand pivot transfer;bedside commode Pt Will Perform Toileting - Clothing Manipulation and hygiene: with supervision;sitting/lateral leans Pt/caregiver will Perform Home Exercise Program: Increased strength;Both right and left upper extremity;Independently;With written HEP provided Additional ADL Goal #1: Pt will perform bed mobility with supervision in preparation for ADL.  Plan Discharge plan remains appropriate    Co-evaluation                 AM-PAC OT "6 Clicks" Daily Activity     Outcome Measure   Help from another person eating meals?: A Little Help from another person taking care of personal grooming?: A Little Help from another person toileting, which includes using toliet, bedpan, or urinal?: A Lot Help from another person bathing (including washing, rinsing, drying)?: A Lot Help from another person to put on and taking off regular upper body clothing?: A Little Help from another person to put on and taking off regular lower body clothing?: A Lot 6 Click Score: 15    End of Session Equipment Utilized During Treatment: Gait belt;Rolling walker  OT Visit Diagnosis: Unsteadiness on feet (R26.81);Pain;Muscle weakness (generalized) (M62.81) Pain - Right/Left: Left Pain - part of body: Leg   Activity  Tolerance Patient limited by lethargy   Patient Left in chair;with call bell/phone within reach;with chair alarm set   Nurse Communication Mobility status        Time: 5009-3818 OT Time Calculation (min): 26 min  Charges: OT General Charges $OT Visit: 1 Visit OT Treatments $Self Care/Home Management : 23-37 mins  [place] Office: [place] 03/09/2019, 10:02 AM

## 2019-03-09 NOTE — Progress Notes (Signed)
[facility] [place]  Subjective  - No complaints   Objective (!) 156/90 86 98.5 F (36.9 C) 17 99%  Intake/Output Summary (Last 24 hours) at 03/09/2019 0815 Last data filed at 03/08/2019 2145 Gross per 24 hour  Intake 350 ml  Output -  Net 350 ml    Left BKA - stable bruising to medial stump.  No drainage.  Laboratory Lab Results: No results for input(s): WBC, HGB, HCT, PLT in the last 72 hours. BMET Recent Labs    03/08/19 0324  CREATININE 0.83    COAG Lab Results  Component Value Date   INR 1.0 02/15/2019   INR 1.41 12/14/2013   INR 1.15 12/14/2013   No results found for: PTT  Assessment/Planning:  Postop day 6 status post left BKA.  There is some bruising to the medial stump that has been stable that we are monitoring.  Overall the rest of it looks good.  I think he would be an excellent candidate for CIR as noted by therapy evaluation.  His insurance denied CIR and though he would be better served with a SNF.  I called his insurance company yesterday and requested a peer to peer and gave them my contact information.  As of this morning I have not been contacted by his insurance company.  [name] 03/09/2019 8:15 AM --

## 2019-03-09 NOTE — Progress Notes (Signed)
Physical Therapy Treatment Patient Details Name: Rodney Sullivan MRN: [id number] DOB: 24-May-1941 Today's Date: 03/09/2019    History of Present Illness Pt is a 78 year old man admitted on 03/01/19 with L foot gangrene. Underwent L BKA  on 03/03/19. PMH: PVD, DM, former smoker.    PT Comments    Patient progressing with mobility able to take some steps in room from recliner to bed.  Excellent performance of amputee exercises in bed.  Continues to be excellent candidate for CIR.  PT to follow.    Follow Up Recommendations  CIR;Supervision/Assistance - 24 hour     Equipment Recommendations  Other (comment)(TBA)    Recommendations for Other Services       Precautions / Restrictions Precautions Precautions: Fall Restrictions LLE Weight Bearing: Non weight bearing    Mobility  Bed Mobility   Bed Mobility: Sit to Supine       Sit to supine: Supervision   General bed mobility comments: able to lift legs into bed  Transfers Overall transfer level: Needs assistance Equipment used: Rolling walker (2 wheeled) Transfers: Sit to/from Stand Sit to Stand: Mod assist         General transfer comment: cues for hand placement, some lifting help and assist for balance in standing  Ambulation/Gait Ambulation/Gait assistance: Mod assist Gait Distance (Feet): 5 Feet Assistive device: Rolling walker (2 wheeled) Gait Pattern/deviations: Decreased stride length;Step-to pattern     General Gait Details: fatigues quickly with UE's and noted some audible crepitus in L shoulder and pt reports only has one intact tendon in shoulder, R leg buckled and assisted to bed during turn to sit on bed   Stairs             Wheelchair Mobility    Modified Rankin (Stroke Patients Only)       Balance     Sitting balance-Leahy Scale: Good       Standing balance-Leahy Scale: Poor Standing balance comment: bilateral UE support and minguard with assist to perform hygiene after  toileting.                            Cognition Arousal/Alertness: Awake/alert Behavior During Therapy: WFL for tasks assessed/performed Overall Cognitive Status: Within Functional Limits for tasks assessed                                        Exercises Amputee Exercises Quad Sets: AROM;5 reps;Supine;Left Hip Extension: AROM;Left;10 reps;Sidelying Hip ABduction/ADduction: AROM;10 reps;Sidelying;Supine;Left(10 in supine, then 10 in sidelying) Other Exercises Other Exercises: single leg bridge on R in supine x 10    General Comments        Pertinent Vitals/Pain Pain Assessment: No/denies pain    Home Living                      Prior Function            PT Goals (current goals can now be found in the care plan section) Progress towards PT goals: Progressing toward goals    Frequency    Min 3X/week      PT Plan Current plan remains appropriate    Co-evaluation              AM-PAC PT "6 Clicks" Mobility   Outcome Measure  Help needed turning from your back to your  side while in a flat bed without using bedrails?: A Little Help needed moving from lying on your back to sitting on the side of a flat bed without using bedrails?: A Little Help needed moving to and from a bed to a chair (including a wheelchair)?: A Little Help needed standing up from a chair using your arms (e.g., wheelchair or bedside chair)?: A Lot Help needed to walk in hospital room?: A Lot Help needed climbing 3-5 steps with a railing? : Total 6 Click Score: 14    End of Session   Activity Tolerance: Patient tolerated treatment well Patient left: in bed;with call bell/phone within reach;with bed alarm set;with family/visitor present   PT Visit Diagnosis: Unsteadiness on feet (R26.81);Muscle weakness (generalized) (M62.81);Other abnormalities of gait and mobility (R26.89)     Time: 1140-1206 PT Time Calculation (min) (ACUTE ONLY): 26  min  Charges:  $Gait Training: 8-22 mins $Therapeutic Exercise: 8-22 mins                     Magda Kiel, [place] [phone] 03/09/2019    Reginia Naas 03/09/2019, 2:54 PM

## 2019-03-09 NOTE — Progress Notes (Signed)
Inpatient Rehab Admissions Coordinator:   Notified by Dr. Carlis Abbott that peer to peer was unsuccessful.  Per [facility] The director, inpatient rehab is not authorized for patients with an amputation unless they are expected to get a prosthetic while in rehab (which has not been Dr. Ainsley Spinner experience, or mine). Discussed with family and they would like to proceed with an expedited appeal, which myself and Dr. Carlis Abbott will assist with.    Shann Medal, PT, DPT Admissions Coordinator [phone] 03/09/19  3:39 PM

## 2019-03-09 NOTE — Progress Notes (Signed)
Inpatient Rehab Admissions Coordinator:   Spoke with [facility] and with Dr. Carlis Abbott.  Awaiting call from medical director for peer to peer to be completed.  Will follow.   Shann Medal, PT, DPT Admissions Coordinator [phone] 03/09/19  10:37 AM

## 2019-03-09 DEATH — deceased

## 2019-03-10 LAB — GLUCOSE, CAPILLARY
Glucose-Capillary: 101 mg/dL — ABNORMAL HIGH (ref 70–99)
Glucose-Capillary: 183 mg/dL — ABNORMAL HIGH (ref 70–99)
Glucose-Capillary: 223 mg/dL — ABNORMAL HIGH (ref 70–99)
Glucose-Capillary: 161 mg/dL — ABNORMAL HIGH (ref 70–99)

## 2019-03-10 NOTE — Progress Notes (Signed)
Inpatient Rehab Admissions Coordinator:   Met with pt and his wife to update them on appeals process for insurance auth for CIR.  Will continue to work towards approval and continue to follow.   Shann Medal, PT, DPT Admissions Coordinator [phone] 03/10/19  12:12 PM

## 2019-03-10 NOTE — Progress Notes (Addendum)
[facility]   Subjective  - Doing well as far as surgery is concerned.   Objective (!) 146/85 90 97.9 F (36.6 C) (Oral) 20 99%  Intake/Output Summary (Last 24 hours) at 03/10/2019 0745 Last data filed at 03/10/2019 0735 Gross per 24 hour  Intake 240 ml  Output -  Net 240 ml    Left BKA less eccymosis with warm skin and intact staples. Lungs non labored breathing Gen NAD  Assessment/Planning: POD # 7 BKA    Reduced bruising well perfused left BKA Will order retention sock from [employer] to start prothesis  Pending Insurance appeal regarding rehab with CIR   [name] 03/10/2019 7:45 AM --  Laboratory Lab Results: No results for input(s): WBC, HGB, HCT, PLT in the last 72 hours. BMET Recent Labs    03/08/19 0324  CREATININE 0.83    COAG Lab Results  Component Value Date   INR 1.0 02/15/2019   INR 1.41 12/14/2013   INR 1.15 12/14/2013   No results found for: PTT  I have seen and evaluated the patient. I agree with the PA note as documented above.  Postop day 7 status post left BKA.  We have been watching a area of ecchymosis on the medial stump it looks slightly improved today.  I had a peer to peer with his insurance company yesterday about CIR.  All of therapy that has evaluated him thinks he would be a great candidate for rehab.  His insurance company denied the peer to peer.  They state that he is not approved if he does not get a prosthetic in rehab per Medicare rules.  I am unfamiliar with this rule and discussed that we send amputations to CIR all the time.  He will be put in a stump shrinker but no expectation he can wear a prosthesis this quickly post-op.  I support his appeal.  In the meantime we will continue to watch his stump.  [name], MD [facility] [place] Office: [phone] Pager: [phone]

## 2019-03-10 NOTE — Progress Notes (Signed)
Notified by insurance that the appeal has been received.

## 2019-03-10 NOTE — Progress Notes (Signed)
Orthopedic Tech Progress Note Patient Details:  Rodney Sullivan 05-Mar-1941 [id number] Called in order to [facility] for a BKA retension sock Patient ID: Daily Crate, male   DOB: [birth year], 78 y.o.   MRN: [id number]   Janit Pagan 03/10/2019, 8:35 AM

## 2019-03-11 LAB — GLUCOSE, CAPILLARY
Glucose-Capillary: 103 mg/dL — ABNORMAL HIGH (ref 70–99)
Glucose-Capillary: 215 mg/dL — ABNORMAL HIGH (ref 70–99)
Glucose-Capillary: 203 mg/dL — ABNORMAL HIGH (ref 70–99)
Glucose-Capillary: 244 mg/dL — ABNORMAL HIGH (ref 70–99)

## 2019-03-11 MED ORDER — TRAMADOL HCL 50 MG PO TABS
50.0000 mg | ORAL_TABLET | Freq: Four times a day (QID) | ORAL | Status: DC | PRN
  Administered 2019-03-11 – 2019-03-17 (×14): 50 mg via ORAL
  Filled 2019-03-11 (×14): qty 1

## 2019-03-11 NOTE — Progress Notes (Addendum)
[facility] [place]  Subjective  - Doing OK over all, he did hit his stump moving around yesterday on the lateral corner and it now sore.     Objective (!) 152/76 70 97.8 F (36.6 C) (Oral) 17 100%  Intake/Output Summary (Last 24 hours) at 03/11/2019 0731 Last data filed at 03/11/2019 0200 Gross per 24 hour  Intake 1200 ml  Output 1000 ml  Net 200 ml    Left stump intact without skin breakdown.  The lateral corner is fine without exterior injury. Stump warm with intact staples Lungs non labored breathing  Assessment/Planning: POD # 8 BKA  Bruising dissipating stump appears viable Has retention sock from [employer] to start prothesis process.  Pending Insurance appeal regarding rehab with CIR   [name] 03/11/2019 7:31 AM --  Laboratory Lab Results: No results for input(s): WBC, HGB, HCT, PLT in the last 72 hours. BMET No results for input(s): NA, K, CL, CO2, GLUCOSE, BUN, CREATININE, CALCIUM in the last 72 hours.  COAG Lab Results  Component Value Date   INR 1.0 02/15/2019   INR 1.41 12/14/2013   INR 1.15 12/14/2013   No results found for: PTT  I have seen and evaluated the patient. I agree with the PA note as documented above. BKA looks good.  Awaiting appeal for CIR.    [name], MD [facility] [place] Office: [phone] Pager: [phone]

## 2019-03-11 NOTE — Progress Notes (Signed)
Physical Therapy Treatment Patient Details Name: Rodney Sullivan MRN: [id number] DOB: May 31, 1941 Today's Date: 03/11/2019    History of Present Illness Pt is a 78 year old man admitted on 03/01/19 with L foot gangrene. Underwent L BKA  on 03/03/19. PMH: PVD, DM, former smoker.    PT Comments    Pt admitted with above diagnosis. Pt was able to pivot to recliner and back to bed with mod assist and cues.  Pt performed exercises and also push ups in sitting to work UEs as well as to work on Nutritional therapist for transitions.  Pt very motivated and continues to progress.  Wife present and observed and participated in treatment as she will assist pt at home.  Pt currently with functional limitations due to balance and endruance deficits. Pt will benefit from skilled PT to increase their independence and safety with mobility to allow discharge to the venue listed below.     Follow Up Recommendations  CIR;Supervision/Assistance - 24 hour     Equipment Recommendations  Other (comment)(TBA)    Recommendations for Other Services       Precautions / Restrictions Precautions Precautions: Fall Restrictions Weight Bearing Restrictions: Yes LLE Weight Bearing: Non weight bearing    Mobility  Bed Mobility Overal bed mobility: Needs Assistance Bed Mobility: Sit to Supine;Supine to Sit     Supine to sit: Supervision Sit to supine: Supervision   General bed mobility comments: Pt uses momentum but can get to EOB and back to supine without help.   Transfers Overall transfer level: Needs assistance Equipment used: Rolling walker (2 wheeled) Transfers: Sit to/from Stand Sit to Stand: Mod assist;Min assist;From elevated surface Stand pivot transfers: Min assist;Mod assist       General transfer comment: cues for hand placement, some lifting help and assist for balance in standing. Pt has difficulty consistently lifting himself to "hop" right foot instead of sliding it.  Had discussion about importance of  hopping to protect knee. Pt transferred to the recliner and then back to the bed with definite signs of fatigue when going back to bed.  Needed assist to control descent onto bed.  Pt then did chair push ups to practice lifting bottom off bed and for UE strength.  Pt able to hold up to 20 seconds.   Ambulation/Gait                 Stairs             Wheelchair Mobility    Modified Rankin (Stroke Patients Only)       Balance Overall balance assessment: Needs assistance Sitting-balance support: No upper extremity supported;Feet supported Sitting balance-Leahy Scale: Good     Standing balance support: During functional activity;Bilateral upper extremity supported Standing balance-Leahy Scale: Poor Standing balance comment: bilateral UE support and min assist wtih RW                            Cognition Arousal/Alertness: Awake/alert Behavior During Therapy: WFL for tasks assessed/performed Overall Cognitive Status: Within Functional Limits for tasks assessed                                        Exercises General Exercises - Lower Extremity Quad Sets: AROM;Both;10 reps;Supine Gluteal Sets: AROM;Both;10 reps;Supine Heel Slides: AROM;Left;10 reps;Supine Hip ABduction/ADduction: AROM;Left;10 reps;Supine Straight Leg Raises: AROM;Left;10 reps;Supine Hip Flexion/Marching: AROM;Both;10  reps;Seated Amputee Exercises Hip Extension: AROM;Left;10 reps;Sidelying Hip ABduction/ADduction: AROM;10 reps;Sidelying;Supine;Left(10 in supine, then 10 in sidelying)    General Comments General comments (skin integrity, edema, etc.): VSS      Pertinent Vitals/Pain Pain Assessment: Faces Faces Pain Scale: Hurts even more Pain Location: L LE Pain Descriptors / Indicators: Operative site guarding;Grimacing Pain Intervention(s): Limited activity within patient's tolerance;Monitored during session;Repositioned    Home Living                       Prior Function            PT Goals (current goals can now be found in the care plan section) Acute Rehab PT Goals Patient Stated Goal: get stronger and eventually return home Progress towards PT goals: Progressing toward goals    Frequency    Min 3X/week      PT Plan Current plan remains appropriate    Co-evaluation              AM-PAC PT "6 Clicks" Mobility   Outcome Measure  Help needed turning from your back to your side while in a flat bed without using bedrails?: A Little Help needed moving from lying on your back to sitting on the side of a flat bed without using bedrails?: A Little Help needed moving to and from a bed to a chair (including a wheelchair)?: A Little Help needed standing up from a chair using your arms (e.g., wheelchair or bedside chair)?: A Lot Help needed to walk in hospital room?: A Lot Help needed climbing 3-5 steps with a railing? : Total 6 Click Score: 14    End of Session Equipment Utilized During Treatment: Gait belt Activity Tolerance: Patient tolerated treatment well Patient left: in bed;with call bell/phone within reach;with bed alarm set;with family/visitor present Nurse Communication: Mobility status PT Visit Diagnosis: Unsteadiness on feet (R26.81);Muscle weakness (generalized) (M62.81);Other abnormalities of gait and mobility (R26.89)     Time: 0321-2248 PT Time Calculation (min) (ACUTE ONLY): 25 min  Charges:  $Therapeutic Exercise: 8-22 mins $Therapeutic Activity: 8-22 mins                     Bonnie Pager:  [phone]  Office:  [place] 03/11/2019, 11:45 AM

## 2019-03-11 NOTE — Progress Notes (Signed)
Inpatient Rehab Admissions Coordinator:   Appeal still pending.  Will follow and keep pt/family updated.   Shann Medal, PT, DPT Admissions Coordinator [phone] 03/11/19  5:10 PM

## 2019-03-11 NOTE — Care Management Important Message (Signed)
Important Message  Patient Details  Name: Rodney Sullivan MRN: [id number] Date of Birth: [birth year]   Medicare Important Message Given:  Yes     [name] 03/11/2019, 11:38 AM

## 2019-03-12 LAB — GLUCOSE, CAPILLARY
Glucose-Capillary: 115 mg/dL — ABNORMAL HIGH (ref 70–99)
Glucose-Capillary: 209 mg/dL — ABNORMAL HIGH (ref 70–99)
Glucose-Capillary: 205 mg/dL — ABNORMAL HIGH (ref 70–99)
Glucose-Capillary: 145 mg/dL — ABNORMAL HIGH (ref 70–99)

## 2019-03-12 NOTE — Progress Notes (Signed)
Inpatient Rehab Admissions Coordinator:   I have not received an update on appeal.  Expect [employer] to be closed on Monday.  Will f/u Tuesday.  Shann Medal, PT, DPT Admissions Coordinator [phone] 03/12/19  4:31 PM

## 2019-03-12 NOTE — Progress Notes (Signed)
Physical Therapy Treatment Patient Details Name: Rodney Sullivan MRN: [id number] DOB: January 01, 1941 Today's Date: 03/12/2019    History of Present Illness Pt is a 78 year old man admitted on 03/01/19 with L foot gangrene. Underwent L BKA  on 03/03/19. PMH: PVD, DM, former smoker.    PT Comments    Pt admitted with above diagnosis. Pt was able to participate in standing activities and exercises.  Pt works very hard and is making progress with transitions getting safer and easier.  Will continue PT.   Pt currently with functional limitations due to balance and endurance deficits. Pt will benefit from skilled PT to increase their independence and safety with mobility to allow discharge to the venue listed below.     Follow Up Recommendations  CIR;Supervision/Assistance - 24 hour     Equipment Recommendations  Other (comment)(TBA)    Recommendations for Other Services       Precautions / Restrictions Precautions Precautions: Fall Restrictions Weight Bearing Restrictions: Yes LLE Weight Bearing: Non weight bearing    Mobility  Bed Mobility               General bed mobility comments: in chair on arrival  Transfers Overall transfer level: Needs assistance Equipment used: Rolling walker (2 wheeled) Transfers: Sit to/from Stand Sit to Stand: Mod assist;Min assist         General transfer comment: cues for hand placement, some lifting help and assist for balance in standing. Pt performed standing exercises standing 3x with min assist and cues.  Good control of descent into chair today.  Pt did 5-6 chair push ups to practice lifting bottom off bed for UE strength and eccentric control with right LE.  First push up pt held 45 seconds and after that just held varying times.    Ambulation/Gait                 Stairs             Wheelchair Mobility    Modified Rankin (Stroke Patients Only)       Balance Overall balance assessment: Needs  assistance Sitting-balance support: No upper extremity supported;Feet supported Sitting balance-Leahy Scale: Good     Standing balance support: During functional activity;Bilateral upper extremity supported Standing balance-Leahy Scale: Poor Standing balance comment: bilateral UE support and min assist wtih RW                            Cognition Arousal/Alertness: Awake/alert Behavior During Therapy: WFL for tasks assessed/performed Overall Cognitive Status: Within Functional Limits for tasks assessed                                        Exercises General Exercises - Lower Extremity Gluteal Sets: AROM;Both;10 reps;Supine Long Arc Quad: AROM;Both;Seated;15 reps Heel Slides: AROM;10 reps;Both;Seated Hip ABduction/ADduction: AROM;Left;10 reps;Standing Hip Flexion/Marching: AROM;Both;10 reps;Seated;Standing Amputee Exercises Hip Extension: AROM;Left;10 reps;Standing    General Comments General comments (skin integrity, edema, etc.): VSS      Pertinent Vitals/Pain Pain Assessment: No/denies pain    Home Living                      Prior Function            PT Goals (current goals can now be found in the care plan section) Acute Rehab PT Goals Patient  Stated Goal: get stronger and eventually return home Progress towards PT goals: Progressing toward goals    Frequency    Min 3X/week      PT Plan Current plan remains appropriate    Co-evaluation              AM-PAC PT "6 Clicks" Mobility   Outcome Measure  Help needed turning from your back to your side while in a flat bed without using bedrails?: A Little Help needed moving from lying on your back to sitting on the side of a flat bed without using bedrails?: A Little Help needed moving to and from a bed to a chair (including a wheelchair)?: A Little Help needed standing up from a chair using your arms (e.g., wheelchair or bedside chair)?: A Lot Help needed to walk  in hospital room?: A Lot Help needed climbing 3-5 steps with a railing? : Total 6 Click Score: 14    End of Session Equipment Utilized During Treatment: Gait belt Activity Tolerance: Patient tolerated treatment well Patient left: with call bell/phone within reach;in chair Nurse Communication: Mobility status PT Visit Diagnosis: Unsteadiness on feet (R26.81);Muscle weakness (generalized) (M62.81);Other abnormalities of gait and mobility (R26.89)     Time: 2233-6122 PT Time Calculation (min) (ACUTE ONLY): 23 min  Charges:  $Therapeutic Exercise: 8-22 mins $Therapeutic Activity: 8-22 mins                     [place] Pager:  [phone]  Office:  [place] 03/12/2019, 9:29 AM

## 2019-03-12 NOTE — Progress Notes (Addendum)
[facility] [place]  Subjective  - Doing well waiting on Insurance appeal.  Great candidate for CIR rehab.    Objective (!) 150/87 85 97.9 F (36.6 C) (Oral) 19 93%  Intake/Output Summary (Last 24 hours) at 03/12/2019 0742 Last data filed at 03/12/2019 0100 Gross per 24 hour  Intake 480 ml  Output -  Net 480 ml    Left stump appears viable and healing well.  Warm to touch staples intact. Ecchymosis continues to dissipate Lungs non labored breathing   Assessment/Planning: POD # 9 left BKA  He has a BKA retention sock which will stay in place until the staples are removed at 4 weeks post op.  Then he will start with compression sleeves to mold the stump to prepare for prothesis.    He is an excellent rehab candidate and pending insurance for CIR appeal.   Cont. Current treatment plan.   [name] 03/12/2019 7:42 AM --  Laboratory Lab Results: No results for input(s): WBC, HGB, HCT, PLT in the last 72 hours. BMET No results for input(s): NA, K, CL, CO2, GLUCOSE, BUN, CREATININE, CALCIUM in the last 72 hours.  COAG Lab Results  Component Value Date   INR 1.0 02/15/2019   INR 1.41 12/14/2013   INR 1.15 12/14/2013   No results found for: PTT  I have seen and evaluated the patient. I agree with the PA note as documented above. Awaiting insurance appeal.  Great candidate for CIR.  Stump stock applied to BKA.    [name], MD [facility] [place] Office: [phone] Pager: [phone]

## 2019-03-13 LAB — GLUCOSE, CAPILLARY
Glucose-Capillary: 102 mg/dL — ABNORMAL HIGH (ref 70–99)
Glucose-Capillary: 187 mg/dL — ABNORMAL HIGH (ref 70–99)
Glucose-Capillary: 248 mg/dL — ABNORMAL HIGH (ref 70–99)
Glucose-Capillary: 194 mg/dL — ABNORMAL HIGH (ref 70–99)

## 2019-03-13 NOTE — Progress Notes (Addendum)
  Progress Note    03/13/2019 8:26 AM 10 Days Post-Op  Subjective:  No new complaints.  Anxiously awaiting insurance appeal to discharge to CIR.   Vitals:   03/12/19 2226 03/13/19 0420  BP: 130/72 (!) 120/58  Pulse: 86 81  Resp: 17 19  Temp: 98.4 F (36.9 C) 98.4 F (36.9 C)  SpO2: 100% 98%   Physical Exam: Lungs:  Non labored Incisions:  L BKA incision with some redness, scant serosanguinous collection on sock, no fluctuant areas on stump Abdomen:  Soft Neurologic: a&O  CBC    Component Value Date/Time   WBC 17.5 (H) 03/05/2019 0423   RBC 3.69 (L) 03/05/2019 0423   HGB 10.4 (L) 03/05/2019 0423   HCT 33.2 (L) 03/05/2019 0423   PLT 258 03/05/2019 0423   MCV 90.0 03/05/2019 0423   MCH 28.2 03/05/2019 0423   MCHC 31.3 03/05/2019 0423   RDW 13.9 03/05/2019 0423   LYMPHSABS 2.1 03/01/2019 1158   MONOABS 1.6 (H) 03/01/2019 1158   EOSABS 0.2 03/01/2019 1158   BASOSABS 0.1 03/01/2019 1158    BMET    Component Value Date/Time   NA 141 03/05/2019 0423   K 3.7 03/05/2019 0423   CL 104 03/05/2019 0423   CO2 26 03/05/2019 0423   GLUCOSE 157 (H) 03/05/2019 0423   BUN 16 03/05/2019 0423   CREATININE 0.83 03/08/2019 0324   CALCIUM 8.6 (L) 03/05/2019 0423   GFRNONAA >60 03/08/2019 0324   GFRAA >60 03/08/2019 0324    INR    Component Value Date/Time   INR 1.0 02/15/2019 0110     Intake/Output Summary (Last 24 hours) at 03/13/2019 0826 Last data filed at 03/13/2019 0000 Gross per 24 hour  Intake 120 ml  Output -  Net 120 ml     Assessment/Plan:  78 y.o. male is s/p L BKA 10 Days Post-Op   Continue to monitor L BKA stump Dry dressing as needed; continue stump sock Awaiting insurance appeal for CIR   Dagoberto Ligas, PA-C [facility] [phone] 03/13/2019 8:26 AM  I agree with the above.  I have seen and evaluated to the patient.  Awaiting dispo  Annamarie Major

## 2019-03-14 LAB — GLUCOSE, CAPILLARY
Glucose-Capillary: 97 mg/dL (ref 70–99)
Glucose-Capillary: 208 mg/dL — ABNORMAL HIGH (ref 70–99)
Glucose-Capillary: 186 mg/dL — ABNORMAL HIGH (ref 70–99)
Glucose-Capillary: 130 mg/dL — ABNORMAL HIGH (ref 70–99)

## 2019-03-14 NOTE — Progress Notes (Addendum)
  Progress Note    03/14/2019 8:32 AM 11 Days Post-Op  Subjective: Waiting on insurance; says L BKA now draining   Vitals:   03/13/19 1949 03/14/19 0340  BP: 129/74 (!) 155/79  Pulse: 80 78  Resp: 19 18  Temp: 98.1 F (36.7 C) 98.2 F (36.8 C)  SpO2: 99% 96%   Physical Exam: Lungs:  Non labored Incisions:  L BKA with mid incision serosanguinous discharge, surrounding erythema improved Neurologic: A&O  CBC    Component Value Date/Time   WBC 17.5 (H) 03/05/2019 0423   RBC 3.69 (L) 03/05/2019 0423   HGB 10.4 (L) 03/05/2019 0423   HCT 33.2 (L) 03/05/2019 0423   PLT 258 03/05/2019 0423   MCV 90.0 03/05/2019 0423   MCH 28.2 03/05/2019 0423   MCHC 31.3 03/05/2019 0423   RDW 13.9 03/05/2019 0423   LYMPHSABS 2.1 03/01/2019 1158   MONOABS 1.6 (H) 03/01/2019 1158   EOSABS 0.2 03/01/2019 1158   BASOSABS 0.1 03/01/2019 1158    BMET    Component Value Date/Time   NA 141 03/05/2019 0423   K 3.7 03/05/2019 0423   CL 104 03/05/2019 0423   CO2 26 03/05/2019 0423   GLUCOSE 157 (H) 03/05/2019 0423   BUN 16 03/05/2019 0423   CREATININE 0.83 03/08/2019 0324   CALCIUM 8.6 (L) 03/05/2019 0423   GFRNONAA >60 03/08/2019 0324   GFRAA >60 03/08/2019 0324    INR    Component Value Date/Time   INR 1.0 02/15/2019 0110     Intake/Output Summary (Last 24 hours) at 03/14/2019 4982 Last data filed at 03/13/2019 2100 Gross per 24 hour  Intake 600 ml  Output -  Net 600 ml     Assessment/Plan:  78 y.o. male is s/p L BKA 11 Days Post-Op   D/c stump sock; wrapped with kerlex and ACE Consider 2-3 staple removal in area of drainage if not improved over next 24 hours Dispo pending CIR appeal   Dagoberto Ligas, PA-C [facility] [phone] 03/14/2019 8:32 AM   I agree with the above.  I have seen and examined the patient  Annamarie Major

## 2019-03-15 LAB — GLUCOSE, CAPILLARY
Glucose-Capillary: 113 mg/dL — ABNORMAL HIGH (ref 70–99)
Glucose-Capillary: 213 mg/dL — ABNORMAL HIGH (ref 70–99)
Glucose-Capillary: 231 mg/dL — ABNORMAL HIGH (ref 70–99)
Glucose-Capillary: 192 mg/dL — ABNORMAL HIGH (ref 70–99)

## 2019-03-15 LAB — CREATININE, SERUM
Creatinine, Ser: 0.97 mg/dL (ref 0.61–1.24)
GFR calc Af Amer: >60 mL/min (ref >60)
GFR calc non Af Amer: >60 mL/min (ref >60)

## 2019-03-15 NOTE — Progress Notes (Signed)
Physical Therapy Treatment Patient Details Name: Rodney Sullivan MRN: [id number] DOB: [birth year] Today's Date: 03/15/2019    History of Present Illness Pt is a 78 year old man admitted on 03/01/19 with L foot gangrene. Underwent L BKA  on 03/03/19. PMH: PVD, DM, former smoker.    PT Comments    Pt admitted with above diagnosis. Pt was able to transfer several times with min guard for lateral scoot transfers and mod for squat and stand pivot transfers.  Did some education with wife regarding transfers and residual limb wrapping.  Pt progressing each visit and works very hard with therapy.  Needs REhab.  If Rehab denies pt, he will need [place], [place], [place] and [place].   Pt currently with functional limitations due to the balance and endurance deficits.  Pt will benefit from skilled PT to increase their independence and safety with mobility to allow discharge to the venue listed below.     Follow Up Recommendations  CIR;Supervision/Assistance - 24 hour     Equipment Recommendations  Other (comment)(drop arm commode)    Recommendations for Other Services       Precautions / Restrictions Precautions Precautions: Fall Restrictions Weight Bearing Restrictions: Yes LLE Weight Bearing: Non weight bearing    Mobility  Bed Mobility Overal bed mobility: Needs Assistance Bed Mobility: Supine to Sit     Supine to sit: Supervision;Min assist     General bed mobility comments: supervision when he uses rail to pull up on.  Min assist without rail   Transfers Overall transfer level: Needs assistance Equipment used: Rolling walker (2 wheeled) Transfers: Sit to/from Stand;Lateral/Scoot Transfers Sit to Stand: Min assist;Mod assist Stand pivot transfers: Min assist;Mod assist Squat pivot transfers: +2 physical assistance;Mod assist    Lateral/Scoot Transfers: Min guard General transfer comment: First pt transferred stand pivot to recliner and back.  Needed cues for hand placement, some lifting  help and assist for balance in standing. Good control of descent into chair today.  Discussed with wife pt progress.  Wife asked about wheelchair transfers therefore showed wife how pt to laterally scoot into wheelchair and back to bed as this would be safest transfer if pt does not get to go to rehab.  Also discussed and practiced 3N1 transfers. They showed PT how pt has been doing it wiht wife and PT gave suggestions as pt has been facing the 3N1 and then turning a full circle instead of just squat pivoting.  Practice 2 transfers to 3N1 and back both directions working on technique and showing wife how to guard with belt. Also showed wife how to wrap the residual limb as his dressing fell off during the transfer.   Ambulation/Gait                 Stairs             Wheelchair Mobility    Modified Rankin (Stroke Patients Only)       Balance Overall balance assessment: Needs assistance Sitting-balance support: No upper extremity supported;Feet supported Sitting balance-Leahy Scale: Good     Standing balance support: During functional activity;Bilateral upper extremity supported Standing balance-Leahy Scale: Poor Standing balance comment: bilateral UE support and min assist with RW                            Cognition Arousal/Alertness: Awake/alert Behavior During Therapy: WFL for tasks assessed/performed Overall Cognitive Status: Impaired/Different from baseline Area of Impairment: Orientation;Safety/judgement;Problem solving  Orientation Level: Disoriented to;Place;Time;Situation       Safety/Judgement: Decreased awareness of safety;Decreased awareness of deficits   Problem Solving: Difficulty sequencing;Requires verbal cues;Requires tactile cues General Comments: Pt has had some confusion and disorientation since last night.       Exercises General Exercises - Lower Extremity Long Arc Quad: AROM;Both;Seated;15 reps Hip  Flexion/Marching: AROM;Both;Seated;15 reps    General Comments General comments (skin integrity, edema, etc.): VSS      Pertinent Vitals/Pain Pain Assessment: 0-10 Faces Pain Scale: Hurts even more Pain Location: L LE Pain Descriptors / Indicators: Operative site guarding;Grimacing Pain Intervention(s): Limited activity within patient's tolerance;Monitored during session;Premedicated before session;Repositioned    Home Living                      Prior Function            PT Goals (current goals can now be found in the care plan section) Acute Rehab PT Goals Patient Stated Goal: get stronger and eventually return home Progress towards PT goals: Progressing toward goals    Frequency    Min 3X/week      PT Plan Current plan remains appropriate    Co-evaluation              AM-PAC PT "6 Clicks" Mobility   Outcome Measure  Help needed turning from your back to your side while in a flat bed without using bedrails?: None Help needed moving from lying on your back to sitting on the side of a flat bed without using bedrails?: A Little Help needed moving to and from a bed to a chair (including a wheelchair)?: A Little Help needed standing up from a chair using your arms (e.g., wheelchair or bedside chair)?: A Lot Help needed to walk in hospital room?: A Lot Help needed climbing 3-5 steps with a railing? : Total 6 Click Score: 15    End of Session Equipment Utilized During Treatment: Gait belt Activity Tolerance: Patient tolerated treatment well Patient left: with call bell/phone within reach;in chair;with family/visitor present Nurse Communication: Mobility status PT Visit Diagnosis: Unsteadiness on feet (R26.81);Muscle weakness (generalized) (M62.81);Other abnormalities of gait and mobility (R26.89)     Time: 0932-3557 PT Time Calculation (min) (ACUTE ONLY): 57 min  Charges:  $Therapeutic Exercise: 8-22 mins $Therapeutic Activity: 38-52 mins                      Rodney Sullivan,PT Acute Rehabilitation Services Pager:  [phone]  Office:  [place] 03/15/2019, 12:40 PM

## 2019-03-15 NOTE — Progress Notes (Signed)
Woke patient up from nap about 2130 for night time med's, noticed his demeanor had changed was alert and oriented but agitated. Asked pt what was wrong pt' stated  I hurt all over" gave pt tylenol and ultram  Went in to recheck patient and he had removed dressing on BKA stump for third time. Pt stated It just keeps falling off pt still seems to be agisted. I asked pt if his pain was better, he stated no, I offered him oxycodone pt stated " they were suppose to take that off of my list" I then asked where he was hurting. He stated all over offered pt flexireal  Which he accepted. Pt stated he was [place] food offered and accepted will continue to monitor.

## 2019-03-15 NOTE — Care Management Important Message (Signed)
Important Message  Patient Details  Name: Rodney Sullivan MRN: [id number] Date of Birth: [birth year]   Medicare Important Message Given:  Yes     Orbie Pyo 03/15/2019, 4:14 PM

## 2019-03-15 NOTE — Progress Notes (Addendum)
  Progress Note    03/15/2019 8:40 AM 12 Days Post-Op  Subjective:  Pain overnight. No complaints this morning   Vitals:   03/14/19 2017 03/15/19 0429  BP: 137/74 116/69  Pulse: 94 72  Resp: (!) 22 20  Temp: (!) 97.5 F (36.4 C) 97.6 F (36.4 C)  SpO2: 97% 100%    Physical Exam: Incisions:  L BKA with persistent slow serosanguinous ooz from mid incision; no fluctuance, no signs of infection    CBC    Component Value Date/Time   WBC 17.5 (H) 03/05/2019 0423   RBC 3.69 (L) 03/05/2019 0423   HGB 10.4 (L) 03/05/2019 0423   HCT 33.2 (L) 03/05/2019 0423   PLT 258 03/05/2019 0423   MCV 90.0 03/05/2019 0423   MCH 28.2 03/05/2019 0423   MCHC 31.3 03/05/2019 0423   RDW 13.9 03/05/2019 0423   LYMPHSABS 2.1 03/01/2019 1158   MONOABS 1.6 (H) 03/01/2019 1158   EOSABS 0.2 03/01/2019 1158   BASOSABS 0.1 03/01/2019 1158    BMET    Component Value Date/Time   NA 141 03/05/2019 0423   K 3.7 03/05/2019 0423   CL 104 03/05/2019 0423   CO2 26 03/05/2019 0423   GLUCOSE 157 (H) 03/05/2019 0423   BUN 16 03/05/2019 0423   CREATININE 0.97 03/15/2019 0054   CALCIUM 8.6 (L) 03/05/2019 0423   GFRNONAA >60 03/15/2019 0054   GFRAA >60 03/15/2019 0054    INR    Component Value Date/Time   INR 1.0 02/15/2019 0110     Intake/Output Summary (Last 24 hours) at 03/15/2019 0840 Last data filed at 03/14/2019 0913 Gross per 24 hour  Intake 240 ml  Output -  Net 240 ml     Assessment/Plan:  78 y.o. male is s/p left below knee amputation 12 Days Post-Op  - 2 staples removed from mid incision; gauze, kerlex, and ACE - ok for CIR pending insurance appeal   Dagoberto Ligas, PA-C [facility] [phone] 03/15/2019 8:40 AM   I agree with the above  Annamarie Major

## 2019-03-15 NOTE — Progress Notes (Signed)
Inpatient Rehabilitation Admissions Coordinator  [place] [employer] is closed today due to the holiday. I met with patient and his wife at bedside and they are aware. Last appeal pending. I will follow up tomorrow.  Danne Baxter, RN, MSN Rehab Admissions Coordinator [phone] 03/15/2019 4:39 PM

## 2019-03-16 LAB — GLUCOSE, CAPILLARY
Glucose-Capillary: 105 mg/dL — ABNORMAL HIGH (ref 70–99)
Glucose-Capillary: 148 mg/dL — ABNORMAL HIGH (ref 70–99)
Glucose-Capillary: 186 mg/dL — ABNORMAL HIGH (ref 70–99)
Glucose-Capillary: 161 mg/dL — ABNORMAL HIGH (ref 70–99)

## 2019-03-16 NOTE — Progress Notes (Signed)
Occupational Therapy Treatment Patient Details Name: Rodney Sullivan MRN: [id number] DOB: 03/28/1941 Today's Date: 03/16/2019    History of present illness Pt is a 78 year old man admitted on 03/01/19 with L foot gangrene. Underwent L BKA  on 03/03/19. PMH: PVD, DM, former smoker.   OT comments  Pt completed bed mobility supervision level, side transfer toward [facility] with RW min (A). Pt able to figure 4 cross R LE to tie shoe. Pt demonstrates 7-10 days CIR stay to reach mod I for adls. Wife present for all adls. Recommend drop arm BSC for home.   Follow Up Recommendations  CIR    Equipment Recommendations  3 in 1 bedside commode;Tub/shower seat    Recommendations for Other Services      Precautions / Restrictions Precautions Precautions: Fall Restrictions Weight Bearing Restrictions: Yes LLE Weight Bearing: Non weight bearing       Mobility Bed Mobility Overal bed mobility: Needs Assistance Bed Mobility: Supine to Sit;Sit to Supine     Supine to sit: Supervision Sit to supine: Supervision   General bed mobility comments: up in chair  Transfers Overall transfer level: Needs assistance Equipment used: Rolling walker (2 wheeled);None Transfers: Sit to/from Stand Sit to Stand: Min assist        Lateral/Scoot Transfers: Supervision General transfer comment: able to stand from elevated bed height to simulate home. wife reports 10 inch difference between     Balance Overall balance assessment: Needs assistance   Sitting balance-Leahy Scale: Good     Standing balance support: During functional activity;Bilateral upper extremity supported Standing balance-Leahy Scale: Poor Standing balance comment: UE support and min a for balance                           ADL either performed or assessed with clinical judgement   ADL Overall ADL's : Needs assistance/impaired                       Lower Body Dressing Details (indicate cue type and reason): pt is  able to figure 4 cross and tie shoe at EOB  Toilet Transfer: Minimal assistance;RW             General ADL Comments: pt demonstrates bed mobiilty, leg exercises, discussed  touching L LE for phantom pains and wrapping multiple times today to help with shaping for prosthetic.      Vision       Perception     Praxis      Cognition Arousal/Alertness: Awake/alert Behavior During Therapy: WFL for tasks assessed/performed Overall Cognitive Status: Within Functional Limits for tasks assessed                                          Exercises Amputee Exercises Quad Sets: AROM;Left;Seated;5 reps Towel Squeeze: AROM;10 reps;Seated;Both Hip ABduction/ADduction: AROM;5 reps;Seated;Left Knee Flexion: AROM;5 reps;Left;Seated Straight Leg Raises: AROM;5 reps;Seated;Left Chair Push Up: Strengthening;10 reps;Seated;Both Other Exercises Other Exercises: hip abduction Other Exercises: arm flexion, scapula retraction, scapula elevation   Shoulder Instructions       General Comments wife in room the entire session. encouraged to keep working toward prosthetic and advocating for next steps    Pertinent Vitals/ Pain       Pain Assessment: No/denies pain Faces Pain Scale: Hurts little more Pain Location: L LE Pain Descriptors / Indicators:  Grimacing Pain Intervention(s): Monitored during session;Repositioned  Home Living                                          Prior Functioning/Environment              Frequency  Min 2X/week        Progress Toward Goals  OT Goals(current goals can now be found in the care plan section)  Progress towards OT goals: Progressing toward goals  Acute Rehab OT Goals Patient Stated Goal: to get stronger so i can get a leg OT Goal Formulation: With patient/family Time For Goal Achievement: 03/18/19 Potential to Achieve Goals: Good ADL Goals Pt Will Perform Lower Body Bathing: with  supervision;sitting/lateral leans;with adaptive equipment Pt Will Perform Lower Body Dressing: with supervision;with adaptive equipment;sit to/from stand Pt Will Transfer to Toilet: with supervision;stand pivot transfer;bedside commode Pt Will Perform Toileting - Clothing Manipulation and hygiene: with supervision;sitting/lateral leans Pt/caregiver will Perform Home Exercise Program: Increased strength;Both right and left upper extremity;Independently;With written HEP provided Additional ADL Goal #1: Pt will perform bed mobility with supervision in preparation for ADL.  Plan Discharge plan remains appropriate    Co-evaluation                 AM-PAC OT "6 Clicks" Daily Activity     Outcome Measure   Help from another person eating meals?: A Little Help from another person taking care of personal grooming?: A Little Help from another person toileting, which includes using toliet, bedpan, or urinal?: A Lot Help from another person bathing (including washing, rinsing, drying)?: A Lot Help from another person to put on and taking off regular upper body clothing?: A Little Help from another person to put on and taking off regular lower body clothing?: A Lot 6 Click Score: 15    End of Session Equipment Utilized During Treatment: Gait belt;Rolling walker  OT Visit Diagnosis: Unsteadiness on feet (R26.81);Pain;Muscle weakness (generalized) (M62.81) Pain - Right/Left: Left Pain - part of body: Leg   Activity Tolerance Patient tolerated treatment well   Patient Left in bed;with call bell/phone within reach;with family/visitor present   Nurse Communication Mobility status;Precautions        Time: 6387-5643 OT Time Calculation (min): 23 min  Charges: OT General Charges $OT Visit: 1 Visit OT Treatments $Self Care/Home Management : 23-37 mins   Jeri Modena, OTR/L  Acute Rehabilitation Services Pager: [phone] Office: [phone] .    Jeri Modena 03/16/2019, 4:27  PM

## 2019-03-16 NOTE — Progress Notes (Addendum)
[facility] [place]  Subjective  - Left leg with mild pain at 5 am now resolved.   Objective (!) 145/83 72 97.8 F (36.6 C) 15 99%  Intake/Output Summary (Last 24 hours) at 03/16/2019 0716 Last data filed at 03/15/2019 1424 Gross per 24 hour  Intake 480 ml  Output -  Net 480 ml    Left BKA staples retained, healing well warm to touch Open area superior medial incision after removal of 2 staples.  SS drainage on old dressing.  Clean dry dressing placed. No ischemic skin changes. Lungs non labored breathing Gen NAD  Assessment/Planning: POD # 13 left BKA  Pending insurance appeal for CIR  [name] 03/16/2019 7:16 AM --  Laboratory Lab Results: No results for input(s): WBC, HGB, HCT, PLT in the last 72 hours. BMET Recent Labs    03/15/19 0054  CREATININE 0.97    COAG Lab Results  Component Value Date   INR 1.0 02/15/2019   INR 1.41 12/14/2013   INR 1.15 12/14/2013   No results found for: PTT  I have seen and evaluated the patient. I agree with the PA note as documented above. Left BKA healing.  Couple staples removed over weekend.  Awaiting appeal for CIR.  Patient states if denied would prefer to go home rather than SNF.  Appreciate the therapy he is getting here on the floor.  [name], MD [facility] [place] Office: [phone] Pager: [phone]

## 2019-03-16 NOTE — Progress Notes (Signed)
Physical Therapy Treatment Patient Details Name: Rodney Sullivan MRN: [id number] DOB: 31-Dec-1940 Today's Date: 03/16/2019    History of Present Illness Pt is a 78 year old man admitted on 03/01/19 with L foot gangrene. Underwent L BKA  on 03/03/19. PMH: PVD, DM, former smoker.    PT Comments    Patient progressing with mobility and wife able to demonstrate residual limb wrapping with supervision and cues.  Still needing mod A for sit to stand and ambulation in the room.  Able to perform lateral scoot transfers with drop arm chair to/from bed with S.  Feel he will greatly benefit from CIR level rehab prior to d/c home.  PT to follow acutely.   Follow Up Recommendations  CIR;Supervision/Assistance - 24 hour     Equipment Recommendations  Other (comment)(drop arm 3:1)    Recommendations for Other Services       Precautions / Restrictions Precautions Precautions: Fall Restrictions Weight Bearing Restrictions: Yes LLE Weight Bearing: Non weight bearing    Mobility  Bed Mobility               General bed mobility comments: up in chair  Transfers Overall transfer level: Needs assistance Equipment used: Rolling walker (2 wheeled);None Transfers: Sit to/from Stand;Lateral/Scoot Transfers Sit to Stand: Mod assist        Lateral/Scoot Transfers: Supervision General transfer comment: lifting help from recliner and from standard chair (no armrests) to RW; to bed and back from droparm recliner positioned next to bed with S  Ambulation/Gait Ambulation/Gait assistance: Mod assist Gait Distance (Feet): 5 Feet Assistive device: Rolling walker (2 wheeled) Gait Pattern/deviations: Decreased stride length;Step-to pattern     General Gait Details: wearing shoes and reports able to take bigger hop which helps so he doesn't get so tired; mod A for safety and balance   Stairs             Wheelchair Mobility    Modified Rankin (Stroke Patients Only)       Balance  Overall balance assessment: Needs assistance   Sitting balance-Leahy Scale: Good     Standing balance support: During functional activity;Bilateral upper extremity supported Standing balance-Leahy Scale: Poor Standing balance comment: UE support and min a for balance                            Cognition Arousal/Alertness: Awake/alert Behavior During Therapy: WFL for tasks assessed/performed Overall Cognitive Status: Within Functional Limits for tasks assessed                                        Exercises Amputee Exercises Quad Sets: AROM;Left;Seated;5 reps Towel Squeeze: AROM;10 reps;Seated;Both Hip ABduction/ADduction: AROM;5 reps;Seated;Left Knee Flexion: AROM;5 reps;Left;Seated Straight Leg Raises: AROM;5 reps;Seated;Left Chair Push Up: Strengthening;10 reps;Seated;Both    General Comments General comments (skin integrity, edema, etc.): wife in room and discussed car transfers (has small SUV,) reports they have cushion for the wheelchair; issued handout on residual limb wrapping and educated wife in technique as she performed on pt with ace wrap.  Also issued a new ace wrap and educated sometimes needed to keep dressing and ace wrap from slipping off.      Pertinent Vitals/Pain Faces Pain Scale: Hurts little more Pain Location: L LE Pain Descriptors / Indicators: Grimacing Pain Intervention(s): Monitored during session;Repositioned    Home Living  Prior Function            PT Goals (current goals can now be found in the care plan section) Progress towards PT goals: Progressing toward goals    Frequency    Min 3X/week      PT Plan Current plan remains appropriate    Co-evaluation              AM-PAC PT "6 Clicks" Mobility   Outcome Measure  Help needed turning from your back to your side while in a flat bed without using bedrails?: None Help needed moving from lying on your back to sitting  on the side of a flat bed without using bedrails?: A Little Help needed moving to and from a bed to a chair (including a wheelchair)?: A Little Help needed standing up from a chair using your arms (e.g., wheelchair or bedside chair)?: A Lot Help needed to walk in hospital room?: A Lot Help needed climbing 3-5 steps with a railing? : Total 6 Click Score: 15    End of Session Equipment Utilized During Treatment: Gait belt Activity Tolerance: Patient tolerated treatment well Patient left: in chair;with call bell/phone within reach;with family/visitor present   PT Visit Diagnosis: Unsteadiness on feet (R26.81);Muscle weakness (generalized) (M62.81);Other abnormalities of gait and mobility (R26.89)     Time: 1610-9604 PT Time Calculation (min) (ACUTE ONLY): 48 min  Charges:  $Therapeutic Exercise: 8-22 mins $Therapeutic Activity: 8-22 mins $Self Care/Home Management: [place], [place] [phone] 03/16/2019    Reginia Naas 03/16/2019, 3:56 PM

## 2019-03-16 NOTE — Progress Notes (Signed)
Received report from RN Introduce to staff, Orient to room. VSS; A/O x 4; Pain 0/10 Reviewed CP Bed Low, Alarm on Call bell within reach Will continue to monitor.

## 2019-03-17 LAB — GLUCOSE, CAPILLARY
Glucose-Capillary: 117 mg/dL — ABNORMAL HIGH (ref 70–99)
Glucose-Capillary: 169 mg/dL — ABNORMAL HIGH (ref 70–99)
Glucose-Capillary: 184 mg/dL — ABNORMAL HIGH (ref 70–99)
Glucose-Capillary: 127 mg/dL — ABNORMAL HIGH (ref 70–99)

## 2019-03-17 NOTE — Progress Notes (Addendum)
[facility] [place]  Subjective  - Doing better each day.   Objective (!) 124/52 61 98.1 F (36.7 C) (Oral) 17 96%  Intake/Output Summary (Last 24 hours) at 03/17/2019 0718 Last data filed at 03/16/2019 1700 Gross per 24 hour  Intake 720 ml  Output -  Net 720 ml    Left BKA staples retained except 2 central ones, no active SS drainage, minimal drainage on dressing. Stump warm and healing well Lungs non labored  Assessment/Planning: POD # 14 left   Pending insurance appeal for CIR  Rodney Sullivan 03/17/2019 7:18 AM --  Laboratory Lab Results: No results for input(s): WBC, HGB, HCT, PLT in the last 72 hours. BMET Recent Labs    03/15/19 0054  CREATININE 0.97    COAG Lab Results  Component Value Date   INR 1.0 02/15/2019   INR 1.41 12/14/2013   INR 1.15 12/14/2013   No results found for: PTT  I have seen and evaluated the patient. I agree with the PA note as documented above. Left BKA looks good.  No issues.  Awaiting CIR appeal.  I reached out to [facility] coordinator for updates today.  [name], MD [facility] [place] Office: [phone] Pager: [phone]

## 2019-03-17 NOTE — Progress Notes (Signed)
Physical Therapy Treatment Patient Details Name: Rodney Sullivan MRN: [id number] DOB: Oct 11, 1940 Today's Date: 03/17/2019    History of Present Illness Pt is a 78 year old man admitted on 03/01/19 with L foot gangrene. Underwent L BKA  on 03/03/19. PMH: PVD, DM, former smoker.    PT Comments    Pt progressing well towards goals. Practiced lateral scoot transfers to/from elevated surfaces to simulate transfers to [facility] at home. Required supervision to min guard for safety. Pt's wife also practiced wrapping residual limb, and required minimal cues for technique. Verbal education provided about phantom limb pain and techniques for management, safety with WC transfers at home, need for assist for stand pivot transfers, and about HHPT. Pt denied from CIR, so plan is to return home with [facility] services. Will need drop arm BSC to ensure safety with toileting tasks. Will continue to follow acutely to maximize functional mobility independence and safety.    Follow Up Recommendations  Home health PT;Supervision/Assistance - 24 hour(HHOT)     Equipment Recommendations  Other (comment)(drop arm 3 in 1)    Recommendations for Other Services       Precautions / Restrictions Precautions Precautions: Fall Restrictions Weight Bearing Restrictions: Yes LLE Weight Bearing: Non weight bearing    Mobility  Bed Mobility Overal bed mobility: Needs Assistance Bed Mobility: Supine to Sit;Sit to Supine     Supine to sit: Supervision Sit to supine: Supervision   General bed mobility comments: Supervision for safety.   Transfers Overall transfer level: Needs assistance   Transfers: Lateral/Scoot Transfers          Lateral/Scoot Transfers: Supervision;Min guard General transfer comment: Practiced lateral scoot transfers to simulate transferring from elevated bed height to WC. Supervision to transfer to chair and min guard for safety to transfer to elevated bed height from chair. Verbally reviewed anterior  posterior transfer, if pt needed to perform and educated about assist required for stand pivot transfers.   Ambulation/Gait                 Stairs             Wheelchair Mobility    Modified Rankin (Stroke Patients Only)       Balance Overall balance assessment: Needs assistance Sitting-balance support: No upper extremity supported;Feet supported Sitting balance-Leahy Scale: Good                                      Cognition Arousal/Alertness: Awake/alert Behavior During Therapy: WFL for tasks assessed/performed Overall Cognitive Status: Within Functional Limits for tasks assessed                                        Exercises      General Comments General comments (skin integrity, edema, etc.): Pt's wife present during session. Verbal education about wheelchair management at home and importance of locking brakes for safety. Educated about using elevating leg rest on LLE for elevation of L residual limb. Had pt's wife practice wrapping residual limb as well during session. Educated about phantom pain and techniques to assist with decreasing phantom pain.       Pertinent Vitals/Pain Pain Assessment: Faces Faces Pain Scale: Hurts a little bit Pain Location: L LE Pain Descriptors / Indicators: Grimacing Pain Intervention(s): Limited activity within patient's tolerance;Monitored during session;Repositioned  Home Living                      Prior Function            PT Goals (current goals can now be found in the care plan section) Acute Rehab PT Goals Patient Stated Goal: to get stronger PT Goal Formulation: With patient/family Time For Goal Achievement: 03/18/19 Potential to Achieve Goals: Good Progress towards PT goals: Progressing toward goals    Frequency    Min 3X/week      PT Plan Discharge plan needs to be updated    Co-evaluation              AM-PAC PT "6 Clicks" Mobility    Outcome Measure  Help needed turning from your back to your side while in a flat bed without using bedrails?: None Help needed moving from lying on your back to sitting on the side of a flat bed without using bedrails?: None Help needed moving to and from a bed to a chair (including a wheelchair)?: A Little Help needed standing up from a chair using your arms (e.g., wheelchair or bedside chair)?: A Lot Help needed to walk in hospital room?: A Lot Help needed climbing 3-5 steps with a railing? : Total 6 Click Score: 16    End of Session         PT Visit Diagnosis: Unsteadiness on feet (R26.81);Muscle weakness (generalized) (M62.81);Other abnormalities of gait and mobility (R26.89)     Time: 6834-1962 PT Time Calculation (min) (ACUTE ONLY): 31 min  Charges:  $Therapeutic Activity: 23-37 mins                     Leighton Ruff, PT, DPT  Acute Rehabilitation Services  Pager: [phone] Office: [phone]    Rudean Hitt 03/17/2019, 6:09 PM

## 2019-03-17 NOTE — Progress Notes (Signed)
Inpatient Rehab Admissions Coordinator:   Estill Batten [employer] for an update on the appeal (started on 9/2), and was informed that the appeal has been denied.  I met with pt and his wife at the bedside, alongside Marvetta Gibbons (CM) to discuss next steps.  CIR will sign off at this time.   Shann Medal, PT, DPT Admissions Coordinator [phone] 03/17/19  3:02 PM

## 2019-03-18 LAB — GLUCOSE, CAPILLARY: Glucose-Capillary: 95 mg/dL (ref 70–99)

## 2019-03-18 MED ORDER — OXYCODONE HCL 5 MG PO TABS: 5.0000 mg | ORAL_TABLET | Freq: Four times a day (QID) | ORAL | 0 refills | Status: DC | PRN

## 2019-03-18 NOTE — Progress Notes (Addendum)
[facility]  Subjective  - Doing better each day.   Objective 121/83 78 98 F (36.7 C) (Oral) 12 99%  Intake/Output Summary (Last 24 hours) at 03/18/2019 0752 Last data filed at 03/17/2019 2156 Gross per 24 hour  Intake 630 ml  Output -  Net 630 ml   Left BKA staples retained except 2 central ones, no active SS drainage, minimal drainage on dressing. Stump warm and healing well Lungs non labored   Assessment/Planning: POD # 15 s/p Left BKA   Home with HH and f/u in 2 weeks for staple removal and incisional check.   Discharge home today  [name] 03/18/2019 7:52 AM --  Laboratory Lab Results: No results for input(s): WBC, HGB, HCT, PLT in the last 72 hours. BMET No results for input(s): NA, K, CL, CO2, GLUCOSE, BUN, CREATININE, CALCIUM in the last 72 hours.  COAG Lab Results  Component Value Date   INR 1.0 02/15/2019   INR 1.41 12/14/2013   INR 1.15 12/14/2013   No results found for: PTT   I have seen and evaluated the patient. I agree with the PA note as documented above. Unfortunately denied appeal for CIR with his insurance company.  Stump looks good.  Plan for d/c home.  F/u 2 weeks for staple removal.   [name], MD [facility] Office: [phone] Pager: [phone]

## 2019-03-18 NOTE — TOC Transition Note (Signed)
Transition of Care [facility]) - CM/SW Discharge Note Marvetta Gibbons RN, BSN Transitions of Care Unit 4E- RN Case Manager [phone]   Patient Details  Name: Rodney Sullivan MRN: [id number] Date of Birth: [birth year]  Transition of Care [facility]) CM/SW Contact:  Dawayne Patricia, RN Phone Number: 03/18/2019, 12:01 PM   Clinical Narrative:    Pt stable for transition home today, per pt and wife son has arrived from [place] to assist them at home. [employer] has delivered 3n1 to room. Per Butch Penny with [facility] they are unable to staff for [facility] needs and can not accept referral. Call made to [place] with [facility] for [facility] needs however pt is out of network with [facility]- per pt he would like CM to check with [facility]. Call made to [facility] with [facility] Lemmings for [facility] referral- per Meredeth Ide has accepted referral and can staff for HHPT/OT/aide needs. Pt and wife updated and wife will transport home.    Final next level of care: [facility] Barriers to Discharge: No Barriers Identified   Patient Goals and CMS Choice Patient states their goals for this hospitalization and ongoing recovery are:: to get to so I can walk with prothesis CMS Medicare.gov Compare Post Acute Care list provided to:: Patient Choice offered to / list presented to : Patient  Discharge Placement         Home with [facility]              Discharge Plan and Services   Discharge Planning Services: CM Consult Post Acute Care Choice: Durable Medical Equipment, Home Health          DME Arranged: 3-N-1 DME Agency: [facility] Date DME Agency Contacted: 03/18/19 Time DME Agency Contacted: 1000 Representative spoke with at DME Agency: zach HH Arranged: PT, OT, Nurse's Aide HH Agency: [place] Date [place]: 03/18/19 Time [place]: 41 Representative spoke with at [place]: [place]) Interventions     Readmission Risk Interventions Readmission Risk Prevention  Plan 03/18/2019  Transportation Screening Complete  PCP or Specialist Appt within 3-5 Days Complete  HRI or [place] Complete  Social Work Consult for [place] Planning/Counseling [place] Not Applicable  Medication Review Press photographer) Complete  Some recent data might be hidden

## 2019-03-18 NOTE — Progress Notes (Signed)
Occupational Therapy Treatment Patient Details Name: Rodney Sullivan MRN: [id number] DOB: 01/28/1941 Today's Date: 03/18/2019    History of present illness Pt is a 78 year old man admitted on 03/01/19 with L foot gangrene. Underwent L BKA  on 03/03/19. PMH: PVD, DM, former smoker.   OT comments  Pt making great progress towards OT goals. Pt up in recliner upon OT arrival awaiting DC. Discussed ADL routine and provided education on using drop arm BSC as shower seat at home. Pt voiced understanding. Issued BUE HEP with level 1 theraband. Demonstrated all therapeutic exercises with pt voicing understanding. Demonstrated simulated car transfer for pt and wife; wife and pt voiced understanding. Pt was denied CIR placement; new DC rec of home with HHOT. Dicussed new DC plan with OTR. Will continue to follow for acute OT needs.   Follow Up Recommendations  Supervision/Assistance - 24 hour;Home health OT    Equipment Recommendations  3 in 1 bedside commode;Tub/shower seat    Recommendations for Other Services      Precautions / Restrictions Precautions Precautions: Fall Restrictions Weight Bearing Restrictions: Yes LLE Weight Bearing: Non weight bearing       Mobility Bed Mobility               General bed mobility comments: up in recliner at start of session  Transfers                 General transfer comment: discussed lateral scoot transfer to car; pt declined simulating in room but wife and pt state understanding    Balance Overall balance assessment: Needs assistance Sitting-balance support: No upper extremity supported;Feet supported Sitting balance-Leahy Scale: Good Sitting balance - Comments: able to static sit in recliner for HEP demonstration       Standing balance comment: deferred standing                           ADL either performed or assessed with clinical judgement   ADL Overall ADL's : Needs assistance/impaired                                        General ADL Comments: pt in recliner declining ADLs; discussed ADL routine at home and uses of drop arm BSC at home; pt and wife voiced understanding     Vision Baseline Vision/History: Wears glasses Wears Glasses: Reading only Patient Visual Report: No change from baseline     Perception     Praxis      Cognition Arousal/Alertness: Awake/alert Behavior During Therapy: WFL for tasks assessed/performed Overall Cognitive Status: Within Functional Limits for tasks assessed                                 General Comments: pt asking appropriate questions with wife present        Exercises General Exercises - Upper Extremity Shoulder Flexion: AROM;Both;10 reps;Theraband;Seated Theraband Level (Shoulder Flexion): Level 1 (Yellow) Shoulder Extension: AROM;Both;10 reps;Seated;Theraband Theraband Level (Shoulder Extension): Level 1 (Yellow) Elbow Flexion: AROM;Both;10 reps;Seated;Theraband Theraband Level (Elbow Flexion): Level 1 (Yellow) Elbow Extension: AROM;Both;10 reps;Seated;Theraband Theraband Level (Elbow Extension): Level 1 (Yellow) Chair Push Up: AROM;Both;Seated   Shoulder Instructions       General Comments      Pertinent Vitals/ Pain       Pain  Assessment: No/denies pain  Home Living                                          Prior Functioning/Environment              Frequency  Min 2X/week        Progress Toward Goals  OT Goals(current goals can now be found in the care plan section)  Progress towards OT goals: Progressing toward goals  Acute Rehab OT Goals Time For Goal Achievement: 03/18/19 Potential to Achieve Goals: Good  Plan Discharge plan remains appropriate    Co-evaluation                 AM-PAC OT "6 Clicks" Daily Activity     Outcome Measure   Help from another person eating meals?: A Little Help from another person taking care of personal grooming?: A  Little Help from another person toileting, which includes using toliet, bedpan, or urinal?: A Lot Help from another person bathing (including washing, rinsing, drying)?: A Lot Help from another person to put on and taking off regular upper body clothing?: A Little Help from another person to put on and taking off regular lower body clothing?: A Lot 6 Click Score: 15    End of Session    OT Visit Diagnosis: Unsteadiness on feet (R26.81);Pain;Muscle weakness (generalized) (M62.81)   Activity Tolerance Patient tolerated treatment well   Patient Left with call bell/phone within reach;with family/visitor present;in chair   Nurse Communication          Time: 6950-7225 OT Time Calculation (min): 12 min  Charges: OT General Charges $OT Visit: 1 Visit OT Treatments $Therapeutic Exercise: 8-22 mins   [place], [place] [phone] [place] 03/18/2019, 10:52 AM

## 2019-03-18 NOTE — TOC Initial Note (Signed)
Transition of Care (TOC) - Initial/Assessment Note  Marvetta Gibbons RN, BSN Transitions of Care Unit 4E- RN Case Manager [phone]   Patient Details  Name: Rodney Sullivan MRN: [id number] Date of Birth: 18-Oct-1940  Transition of Care [facility]) CM/SW Contact:    Dawayne Patricia, RN Phone Number: 03/18/2019, 10:28 AM  Clinical Narrative:                 Pt s/p BKA and has been awaiting for insurance decision for CIR with appeal process- notified today that insurance has issued a final denial with outside review of case. Spoke with pt and wife along with [place] from [employer]. Discussed plans to return home with [facility]. They will have their son come to assist from [place].  Pt and wife do not want to go to STSNF. Will need HHPT/OT/aide, will request orders from rounding team. Pt has RW at home and w/c. Requesting 3n1 with drop arm- will place orders and have [employer] deliver to room prior to discharge. List for [facility] agencies had previously been provided Per CMS guidelines from medicare.gov website with star ratings (copy placed in shadow chart) for pt and wife to review- and per choice they have decided on [facility] - will call and see if [facility] can accept referral. CM will f/u with pt and wife in am.   Expected Discharge Plan: [place] Barriers to Discharge: No Barriers Identified   Patient Goals and CMS Choice Patient states their goals for this hospitalization and ongoing recovery are:: to get to so I can walk with prothesis CMS Medicare.gov Compare Post Acute Care list provided to:: Patient Choice offered to / list presented to : Patient  Expected Discharge Plan and Services Expected Discharge Plan: [place]   Discharge Planning Services: CM Consult Post Acute Care Choice: Durable Medical Equipment, Home Health Living arrangements for the past 2 months: Single Family Home Expected Discharge Date: 03/18/19               DME Arranged: 3-N-1 DME Agency:  [facility] Date DME Agency Contacted: 03/18/19 Time DME Agency Contacted: 1000 Representative spoke with at DME Agency: zach HH Arranged: PT, OT, Nurse's Aide          Prior Living Arrangements/Services Living arrangements for the past 2 months: [place] with:: Spouse Patient language and need for interpreter reviewed:: Yes Do you feel safe going back to the place where you live?: Yes      Need for Family Participation in Patient Care: Yes (Comment) Care giver support system in place?: Yes (comment)   Criminal Activity/Legal Involvement Pertinent to Current Situation/Hospitalization: No - Comment as needed  Activities of Daily Living Home Assistive Devices/Equipment: Eyeglasses ADL Screening (condition at time of admission) Patient's cognitive ability adequate to safely complete daily activities?: Yes Is the patient deaf or have difficulty hearing?: No Does the patient have difficulty seeing, even when wearing glasses/contacts?: No Does the patient have difficulty concentrating, remembering, or making decisions?: No Patient able to express need for assistance with ADLs?: Yes Does the patient have difficulty dressing or bathing?: No Independently performs ADLs?: Yes (appropriate for developmental age) Does the patient have difficulty walking or climbing stairs?: Yes Weakness of Legs: Right(LEFT BKA) Weakness of Arms/Hands: None  Permission Sought/Granted Permission sought to share information with : Investment banker, corporate granted to share info w AGENCY: [facility] agency        Emotional Assessment  Appearance:: Appears stated age Attitude/Demeanor/Rapport: Engaged Affect (typically observed): Appropriate, Pleasant Orientation: : Oriented to Self, Oriented to Place, Oriented to  Time, Oriented to Situation   Psych Involvement: No (comment)  Admission diagnosis:  Peripheral arterial disease ([place]) [I73.9] Gangrene of foot ([place])  [I96] Patient Active Problem List   Diagnosis Date Noted  . Gangrene of left foot ([place]) 03/01/2019  . Sepsis ([place]) 02/15/2019  . Anemia 02/15/2019  . Cellulitis and abscess of toe of left foot 02/15/2019  . Peripheral arterial disease ([place]) 02/15/2019  . DM (diabetes mellitus), type 2 with peripheral vascular complications ([place]) 28/00/3491  . Gout 02/15/2019  . Septic shock ([place]) 12/14/2013  . Pulmonary embolism ([place]) 12/14/2013  . Crohn disease ([place]) 12/14/2013   PCP:  Egbert Garibaldi, PA-C Pharmacy:   [facility], [place] [address] Phone: [phone] Fax: [phone]     Social Determinants of Health (SDOH) Interventions    Readmission Risk Interventions Readmission Risk Prevention Plan 03/18/2019  Transportation Screening Complete  PCP or Specialist Appt within 3-5 Days Complete  HRI or [place] Complete  Social Work Consult for [place] Planning/Counseling Complete  Palliative Care Screening Not Applicable  Medication Review Press photographer) Complete  Some recent data might be hidden

## 2019-03-30 ENCOUNTER — Ambulatory Visit (INDEPENDENT_AMBULATORY_CARE_PROVIDER_SITE_OTHER): Payer: Self-pay | Admitting: Vascular Surgery

## 2019-03-30 ENCOUNTER — Encounter: Payer: Self-pay | Admitting: Vascular Surgery

## 2019-03-30 ENCOUNTER — Telehealth: Payer: Self-pay | Admitting: Vascular Surgery

## 2019-03-30 ENCOUNTER — Telehealth: Payer: Self-pay | Admitting: *Deleted

## 2019-03-30 ENCOUNTER — Other Ambulatory Visit: Payer: Self-pay

## 2019-03-30 VITALS — BP 153/84 | HR 93 | Temp 96.6°F | Resp 16 | Ht 67.0 in | Wt 156.0 lb

## 2019-03-30 DIAGNOSIS — I96 Gangrene, not elsewhere classified: Secondary | ICD-10-CM

## 2019-03-30 MED ORDER — CEPHALEXIN 500 MG PO CAPS: 500.0000 mg | ORAL_CAPSULE | Freq: Three times a day (TID) | ORAL | 0 refills | Status: DC

## 2019-03-30 NOTE — Telephone Encounter (Signed)
Rodney Sullivan called back and verbalized understanding.  She will change the wet to dry dressing changes from QD to BID.    Thurston Hole., LPN

## 2019-03-30 NOTE — Telephone Encounter (Addendum)
I called and left message for Rodney Sullivan at [facility] to return my call.  Per Dr. Carlis Abbott, this patient needs more frequent dressing changes to his left BKA (Wet to dry).    Thurston Hole., LPN

## 2019-03-30 NOTE — Progress Notes (Signed)
Patient name: Rodney Sullivan MRN: [id number] DOB: 08/30/1940 Sex: male  REASON FOR VISIT: Triage visit after fall on left BKA  HPI: Rodney Sullivan is a 78 y.o. male that presents for triage visit for evaluation of left BKA.  He had a left BKA on 03/03/2019 for critical limb ischemia with tissue loss and had already undergone revascularization.  Reports that this was healing accordingly until he fell last night out of his recliner.  Has noticed some swelling and bloody drainage from the stump at this time.  States his pain was previously 1 out of 10 and now 5 out of 10.  Past Medical History:  Diagnosis Date  . Bacteremia due to Escherichia coli 11/2013  . Crohn disease ([place])   . Diabetes mellitus without complication ([place])   . DM (diabetes mellitus), type 2 with peripheral vascular complications ([place]) 6/94/8546  . DVT (deep venous thrombosis) ([place]) 11/27/13  . History of skin cancer    ARMS & FACE  . Peripheral arterial disease ([place]) 02/15/2019  . Pulmonary embolism ([place]) 11/27/13     Past Surgical History:  Procedure Laterality Date  . ABDOMINAL AORTOGRAM N/A 12/14/2018   Procedure: ABDOMINAL AORTOGRAM;  Surgeon: Waynetta Sandy, MD;  Location: [facility];  Service: Cardiovascular;  Laterality: N/A;  . ABDOMINAL AORTOGRAM W/LOWER EXTREMITY Bilateral 02/17/2019   Procedure: ABDOMINAL AORTOGRAM W/LOWER EXTREMITY;  Surgeon: [name], MD;  Location: [facility];  Service: Cardiovascular;  Laterality: Bilateral;  . ABDOMINAL SURGERY     Bowel resection x2  . AMPUTATION Right 12/21/2018   Procedure: RIGHT LONG FINGER AMPUTATION;  Surgeon: Leanora Cover, MD;  Location: [facility];  Service: Orthopedics;  Laterality: Right;  . AMPUTATION Left 03/03/2019   Procedure: AMPUTATION BELOW KNEE;  Surgeon: [name], MD;  Location: [facility];  Service: Vascular;  Laterality: Left;  . CHOLECYSTECTOMY    . COLON SURGERY    . HIP SURGERY     BOTH  .  JOINT REPLACEMENT    . LOWER EXTREMITY ANGIOGRAPHY Right 12/14/2018   Procedure: LOWER EXTREMITY ANGIOGRAPHY;  Surgeon: Waynetta Sandy, MD;  Location: [facility];  Service: Cardiovascular;  Laterality: Right;  . PERIPHERAL VASCULAR ATHERECTOMY  02/17/2019   Procedure: PERIPHERAL VASCULAR ATHERECTOMY;  Surgeon: [name], MD;  Location: [facility];  Service: Cardiovascular;;  . SHOULDER SURGERY     RIGHT TENDON DETACHED  . UPPER EXTREMITY ANGIOGRAPHY Right 12/14/2018   Procedure: Right UPPER EXTREMITY ANGIOGRAPHY;  Surgeon: Waynetta Sandy, MD;  Location: [facility];  Service: Cardiovascular;  Laterality: Right;    Family History  Problem Relation Age of Onset  . Hypertension Mother     SOCIAL HISTORY: Social History   Tobacco Use  . Smoking status: Former Smoker    Years: 10.00    Types: Pipe  . Smokeless tobacco: Former Systems developer    Quit date: 12/08/1979  Substance Use Topics  . Alcohol use: No    Alcohol/week: 0.0 standard drinks    Allergies  Allergen Reactions  . Ciprofloxacin Other (See Comments)    tendons hurt  . Shrimp [Shellfish Allergy]     Current Outpatient Medications  Medication Sig Dispense Refill  . albuterol (VENTOLIN HFA) 108 (90 Base) MCG/ACT inhaler Inhale 2 puffs into the lungs every 4 (four) hours as needed for wheezing or shortness of breath.     . allopurinol (ZYLOPRIM) 100 MG tablet Take 100 mg by mouth  daily.    . amLODipine (NORVASC) 2.5 MG tablet Take 2.5 mg by mouth daily with supper.     Marland Kitchen aspirin EC 81 MG tablet Take 81 mg by mouth daily with supper.     . brimonidine-timolol (COMBIGAN) 0.2-0.5 % ophthalmic solution Place 1 drop into both eyes 2 (two) times a day.    . Certolizumab Pegol (CIMZIA) 2 X 200 MG KIT Inject 400 mg into the skin every 28 (twenty-eight) days.    . Cholecalciferol (VITAMIN D) 50 MCG (2000 UT) tablet Take 2,000 Units by mouth daily.    . clotrimazole-betamethasone  (LOTRISONE) cream Apply 1 application topically 2 (two) times daily as needed (fungus). On the side of mouth    . Cyanocobalamin (B-12) 2500 MCG TABS Take 2,500 mcg by mouth daily.    . cyclobenzaprine (FLEXERIL) 10 MG tablet Take 10 mg by mouth daily as needed for muscle spasms.    . Dexamethasone Acetate 8 MG/ML SUSP Inject 8 mg into the muscle daily as needed (for Crohn's flare).    . diphenhydrAMINE (BENADRYL) 25 MG tablet Take 25 mg by mouth 2 (two) times daily as needed for allergies or sleep.     Marland Kitchen Emollient (ZIMS CRACK CREME) CREA Apply 1 application topically daily as needed (dry skin).     . famciclovir (FAMVIR) 250 MG tablet Take 250 mg by mouth daily as needed (fungus on corner of mouth flare).     . ferrous sulfate 325 (65 FE) MG tablet Take 325 mg by mouth daily.    Marland Kitchen ketoconazole (NIZORAL) 200 MG tablet Take 200 mg by mouth daily as needed (yeast infection).    Marland Kitchen latanoprost (XALATAN) 0.005 % ophthalmic solution Place 1 drop into both eyes at bedtime.    . mesalamine (PENTASA) 500 MG CR capsule Take 2,000 mg by mouth 2 (two) times daily.    . Multiple Vitamin (MULTIVITAMIN WITH MINERALS) TABS Take 1 tablet by mouth daily.    . Omega-3 Fatty Acids (FISH OIL) 1000 MG CAPS Take 1,000 mg by mouth daily.    Marland Kitchen OVER THE COUNTER MEDICATION Take 5 mLs by mouth 2 (two) times daily. CBD oil drops    . pantoprazole (PROTONIX) 40 MG tablet Take 40 mg by mouth daily at 3 pm.     . pioglitazone (ACTOS) 15 MG tablet Take 15 mg by mouth daily.     . predniSONE (DELTASONE) 10 MG tablet Take 22.5 mg by mouth daily after breakfast.     . traMADol (ULTRAM) 50 MG tablet Take 1 tablet (50 mg total) by mouth at bedtime as needed for moderate pain.    . benzonatate (TESSALON) 100 MG capsule Take 100 mg by mouth 3 (three) times daily as needed for cough.    . cephALEXin (KEFLEX) 500 MG capsule Take 1 capsule (500 mg total) by mouth 3 (three) times daily. 21 capsule 0  . oxyCODONE (OXY IR/ROXICODONE) 5 MG  immediate release tablet Take 1 tablet (5 mg total) by mouth every 6 (six) hours as needed for severe pain. (Patient not taking: Reported on 03/30/2019) 10 tablet 0  . pravastatin (PRAVACHOL) 10 MG tablet Take 1 tablet (10 mg total) by mouth every evening. 30 tablet 0   No current facility-administered medications for this visit.     REVIEW OF SYSTEMS:  [X]  denotes positive finding, [ ]  denotes negative finding Cardiac  Comments:  Chest pain or chest pressure:    Shortness of breath upon exertion:    Short  of breath when lying flat:    Irregular heart rhythm:        Vascular    Pain in calf, thigh, or hip brought on by ambulation:    Pain in feet at night that wakes you up from your sleep:     Blood clot in your veins:    Leg swelling:         Pulmonary    Oxygen at home:    Productive cough:     Wheezing:         Neurologic    Sudden weakness in arms or legs:     Sudden numbness in arms or legs:     Sudden onset of difficulty speaking or slurred speech:    Temporary loss of vision in one eye:     Problems with dizziness:         Gastrointestinal    Blood in stool:     Vomited blood:         Genitourinary    Burning when urinating:     Blood in urine:        Psychiatric    Major depression:         Hematologic    Bleeding problems:    Problems with blood clotting too easily:        Skin    Rashes or ulcers:        Constitutional    Fever or chills:      PHYSICAL EXAM: Vitals:   03/30/19 1009  BP: (!) 153/84  Pulse: 93  Resp: 16  Temp: (!) 96.6 F (35.9 C)  TempSrc: Temporal  SpO2: 99%  Weight: 156 lb (70.8 kg)  Height: 5' 7"  (1.702 m)    GENERAL: The patient is a well-nourished male, in no acute distress. The vital signs are documented above. CARDIAC: There is a regular rate and rhythm.  VASCULAR:  Left BKA as pictured below Open area draining some serosanguinous fluid      DATA:   None  Assessment/Plan:  78 year old male status post  recent BKA that presents as triage visit after fall on his BKA last night.  Removed approximately 3 staples that were essentially avulsed after the fall.  He has some bloody drainage where the wound is little bit open now we will packed this with wet-to-dry dressing daily.  We will arrange home health to help with dressing changes.  Also put him on 1 week of Keflex to prevent infection in the stump.  I will plan to see him back in 1 week for wound check.  Discussed high risk that he may ultimately require BKA revision or conversion to AKA.   [name], MD [facility] Office: [phone] Pager: [phone]

## 2019-03-30 NOTE — Telephone Encounter (Signed)
Mrs. Pfluger called to report that patient has fallen and his BKA stump is bleeding and painful. He was scheduled to come in next week for staple removal (left BKA 03-03-2019 by Dr. Carlis Abbott). I will bring him in for a wound check today.

## 2019-04-06 ENCOUNTER — Encounter: Payer: Self-pay | Admitting: *Deleted

## 2019-04-06 ENCOUNTER — Encounter: Payer: Self-pay | Admitting: Vascular Surgery

## 2019-04-06 ENCOUNTER — Other Ambulatory Visit: Payer: Self-pay

## 2019-04-06 ENCOUNTER — Ambulatory Visit (INDEPENDENT_AMBULATORY_CARE_PROVIDER_SITE_OTHER): Payer: Self-pay | Admitting: Vascular Surgery

## 2019-04-06 ENCOUNTER — Other Ambulatory Visit (HOSPITAL_COMMUNITY)
Admission: RE | Admit: 2019-04-06 | Discharge: 2019-04-06 | Disposition: A | Discharge status: Home / Self Care | Payer: Medicare HMO | Source: Ambulatory Visit | Attending: Vascular Surgery | Admitting: Vascular Surgery

## 2019-04-06 ENCOUNTER — Other Ambulatory Visit: Payer: Self-pay | Admitting: *Deleted

## 2019-04-06 VITALS — BP 127/73 | HR 97 | Temp 97.8°F | Resp 16 | Ht 67.0 in | Wt 156.8 lb

## 2019-04-06 DIAGNOSIS — Z20828 Contact with and (suspected) exposure to other viral communicable diseases: Secondary | ICD-10-CM | POA: Insufficient documentation

## 2019-04-06 DIAGNOSIS — I739 Peripheral vascular disease, unspecified: Secondary | ICD-10-CM

## 2019-04-06 DIAGNOSIS — Z01812 Encounter for preprocedural laboratory examination: Secondary | ICD-10-CM | POA: Insufficient documentation

## 2019-04-06 NOTE — Progress Notes (Signed)
Patient name: Rodney Sullivan MRN: [id number] DOB: Nov 12, 1940 Sex: male  REASON FOR VISIT: F/U L BKA after fall  HPI: Rodney Sullivan is a 78 y.o. male that presents for further follow-up after fall on left BKA last week.  He had a left BKA on 03/03/2019 for critical limb ischemia with tissue loss and had already undergone revascularization with left PT atherectomy.  Reports that this was healing accordingly until he fell last week out of his recliner.  Has noticed some swelling and bloody drainage from the stump at this time.    He has been on Keflex this past week for some redness as well.  No other systemic symptoms.    Past Medical History:  Diagnosis Date  . Bacteremia due to Escherichia coli 11/2013  . Crohn disease ([place])   . Diabetes mellitus without complication ([place])   . DM (diabetes mellitus), type 2 with peripheral vascular complications ([place]) 0/09/7046  . DVT (deep venous thrombosis) ([place]) 11/27/13  . History of skin cancer    ARMS & FACE  . Peripheral arterial disease ([place]) 02/15/2019  . Pulmonary embolism ([place]) 11/27/13     Past Surgical History:  Procedure Laterality Date  . ABDOMINAL AORTOGRAM N/A 12/14/2018   Procedure: ABDOMINAL AORTOGRAM;  Surgeon: Waynetta Sandy, MD;  Location: [facility];  Service: Cardiovascular;  Laterality: N/A;  . ABDOMINAL AORTOGRAM W/LOWER EXTREMITY Bilateral 02/17/2019   Procedure: ABDOMINAL AORTOGRAM W/LOWER EXTREMITY;  Surgeon: [name], MD;  Location: [facility];  Service: Cardiovascular;  Laterality: Bilateral;  . ABDOMINAL SURGERY     Bowel resection x2  . AMPUTATION Right 12/21/2018   Procedure: RIGHT LONG FINGER AMPUTATION;  Surgeon: Leanora Cover, MD;  Location: [facility];  Service: Orthopedics;  Laterality: Right;  . AMPUTATION Left 03/03/2019   Procedure: AMPUTATION BELOW KNEE;  Surgeon: [name], MD;  Location: [facility];  Service: Vascular;  Laterality: Left;  .  CHOLECYSTECTOMY    . COLON SURGERY    . HIP SURGERY     BOTH  . JOINT REPLACEMENT    . LOWER EXTREMITY ANGIOGRAPHY Right 12/14/2018   Procedure: LOWER EXTREMITY ANGIOGRAPHY;  Surgeon: Waynetta Sandy, MD;  Location: [facility];  Service: Cardiovascular;  Laterality: Right;  . PERIPHERAL VASCULAR ATHERECTOMY  02/17/2019   Procedure: PERIPHERAL VASCULAR ATHERECTOMY;  Surgeon: [name], MD;  Location: [facility];  Service: Cardiovascular;;  . SHOULDER SURGERY     RIGHT TENDON DETACHED  . UPPER EXTREMITY ANGIOGRAPHY Right 12/14/2018   Procedure: Right UPPER EXTREMITY ANGIOGRAPHY;  Surgeon: Waynetta Sandy, MD;  Location: [facility];  Service: Cardiovascular;  Laterality: Right;    Family History  Problem Relation Age of Onset  . Hypertension Mother     SOCIAL HISTORY: Social History   Tobacco Use  . Smoking status: Former Smoker    Years: 10.00    Types: Pipe  . Smokeless tobacco: Former Systems developer    Quit date: 12/08/1979  Substance Use Topics  . Alcohol use: No    Alcohol/week: 0.0 standard drinks    Allergies  Allergen Reactions  . Ciprofloxacin Other (See Comments)    tendons hurt  . Shrimp [Shellfish Allergy]     Current Outpatient Medications  Medication Sig Dispense Refill  . albuterol (VENTOLIN HFA) 108 (90 Base) MCG/ACT inhaler Inhale 2 puffs into the lungs every 4 (four) hours as needed for wheezing or shortness of breath.     Marland Kitchen  allopurinol (ZYLOPRIM) 100 MG tablet Take 100 mg by mouth daily.    Marland Kitchen amLODipine (NORVASC) 2.5 MG tablet Take 2.5 mg by mouth daily with supper.     Marland Kitchen aspirin EC 81 MG tablet Take 81 mg by mouth daily with supper.     . benzonatate (TESSALON) 100 MG capsule Take 100 mg by mouth 3 (three) times daily as needed for cough.    . brimonidine-timolol (COMBIGAN) 0.2-0.5 % ophthalmic solution Place 1 drop into both eyes 2 (two) times a day.    . cephALEXin (KEFLEX) 500 MG capsule Take 1 capsule (500 mg  total) by mouth 3 (three) times daily. 21 capsule 0  . Certolizumab Pegol (CIMZIA) 2 X 200 MG KIT Inject 400 mg into the skin every 28 (twenty-eight) days.    . Cholecalciferol (VITAMIN D) 50 MCG (2000 UT) tablet Take 2,000 Units by mouth daily.    . clotrimazole-betamethasone (LOTRISONE) cream Apply 1 application topically 2 (two) times daily as needed (fungus). On the side of mouth    . Cyanocobalamin (B-12) 2500 MCG TABS Take 2,500 mcg by mouth daily.    . cyclobenzaprine (FLEXERIL) 10 MG tablet Take 10 mg by mouth daily as needed for muscle spasms.    . Dexamethasone Acetate 8 MG/ML SUSP Inject 8 mg into the muscle daily as needed (for Crohn's flare).    . diphenhydrAMINE (BENADRYL) 25 MG tablet Take 25 mg by mouth 2 (two) times daily as needed for allergies or sleep.     Marland Kitchen Emollient (ZIMS CRACK CREME) CREA Apply 1 application topically daily as needed (dry skin).     . famciclovir (FAMVIR) 250 MG tablet Take 250 mg by mouth daily as needed (fungus on corner of mouth flare).     . ferrous sulfate 325 (65 FE) MG tablet Take 325 mg by mouth daily.    Marland Kitchen ketoconazole (NIZORAL) 200 MG tablet Take 200 mg by mouth daily as needed (yeast infection).    Marland Kitchen latanoprost (XALATAN) 0.005 % ophthalmic solution Place 1 drop into both eyes at bedtime.    Marland Kitchen LORazepam (ATIVAN) 0.5 MG tablet Take 0.5 mg by mouth as needed for anxiety.    . mesalamine (PENTASA) 500 MG CR capsule Take 2,000 mg by mouth 2 (two) times daily.    . Multiple Vitamin (MULTIVITAMIN WITH MINERALS) TABS Take 1 tablet by mouth daily.    . Omega-3 Fatty Acids (FISH OIL) 1000 MG CAPS Take 1,000 mg by mouth daily.    Marland Kitchen OVER THE COUNTER MEDICATION Take 5 mLs by mouth 2 (two) times daily. CBD oil drops    . oxyCODONE (OXY IR/ROXICODONE) 5 MG immediate release tablet Take 1 tablet (5 mg total) by mouth every 6 (six) hours as needed for severe pain. 10 tablet 0  . pantoprazole (PROTONIX) 40 MG tablet Take 40 mg by mouth daily at 3 pm.     .  pioglitazone (ACTOS) 15 MG tablet Take 15 mg by mouth daily.     . predniSONE (DELTASONE) 10 MG tablet Take 22.5 mg by mouth daily after breakfast.     . traMADol (ULTRAM) 50 MG tablet Take 1 tablet (50 mg total) by mouth at bedtime as needed for moderate pain.    . pravastatin (PRAVACHOL) 10 MG tablet Take 1 tablet (10 mg total) by mouth every evening. 30 tablet 0   No current facility-administered medications for this visit.     REVIEW OF SYSTEMS:  [X]  denotes positive finding, [ ]  denotes negative  finding Cardiac  Comments:  Chest pain or chest pressure:    Shortness of breath upon exertion:    Short of breath when lying flat:    Irregular heart rhythm:        Vascular    Pain in calf, thigh, or hip brought on by ambulation:    Pain in feet at night that wakes you up from your sleep:     Blood clot in your veins:    Leg swelling:         Pulmonary    Oxygen at home:    Productive cough:     Wheezing:         Neurologic    Sudden weakness in arms or legs:     Sudden numbness in arms or legs:     Sudden onset of difficulty speaking or slurred speech:    Temporary loss of vision in one eye:     Problems with dizziness:         Gastrointestinal    Blood in stool:     Vomited blood:         Genitourinary    Burning when urinating:     Blood in urine:        Psychiatric    Major depression:         Hematologic    Bleeding problems:    Problems with blood clotting too easily:        Skin    Rashes or ulcers:        Constitutional    Fever or chills:      PHYSICAL EXAM: Vitals:   04/06/19 0827  BP: 127/73  Pulse: 97  Resp: 16  Temp: 97.8 F (36.6 C)  TempSrc: Temporal  SpO2: 96%  Weight: 156 lb 12.8 oz (71.1 kg)  Height: 5' 7"  (1.702 m)    GENERAL: The patient is a well-nourished male, in no acute distress. The vital signs are documented above. CARDIAC: There is a regular rate and rhythm.  VASCULAR:  Left BKA as pictured below Wound dehisced after  fall as pictured below        DATA:   None  Assessment/Plan:  78 year old male status post recent BKA that presented last week as triage visit after fall on his BKA.  The wound continues to dehisce following his traumatic fall on the stump.  He is in wet-to-dry dressings at this time with home health.  I think the wound needs to be debrided in the operating room to try and salvage his BKA and plan for [facility] placement.  We will plan to do this on Friday as an outpatient.  He should be able to go home afterwards.  We will arrange home health with twice weekly VAC changes.  Discussed risk that his BKA does not heal and he will need an above-knee amputation.  I will remove his staples in the OR on Friday.  He can stop the Plavix and just do aspirin daily.  [name], MD [facility] Office: [phone] Pager: [phone]

## 2019-04-07 LAB — NOVEL CORONAVIRUS, NAA (HOSP ORDER, SEND-OUT TO REF LAB; TAT 18-24 HRS): SARS-CoV-2, NAA: NOT DETECTED

## 2019-04-08 ENCOUNTER — Other Ambulatory Visit: Payer: Self-pay

## 2019-04-08 ENCOUNTER — Other Ambulatory Visit: Payer: Self-pay | Admitting: *Deleted

## 2019-04-08 ENCOUNTER — Encounter (HOSPITAL_COMMUNITY): Payer: Self-pay | Admitting: *Deleted

## 2019-04-08 DIAGNOSIS — T8131XS Disruption of external operation (surgical) wound, not elsewhere classified, sequela: Secondary | ICD-10-CM

## 2019-04-08 NOTE — Progress Notes (Signed)
Mr Knack denies chest pain or shortness of breath. Patient  tested negative for Covid and has been in quarantine since that time. Mr Netherton asked me to speak to his wife Mr Willems has type II diabetes, he reports that CBGs normally run in the low 100's this am it was 140, which is very high for patient. I instructed patient to not take                            Diabetic medication in am. I instructed patient to check CBG after awaking and every 2 hours until arrival  to the hospital.  I Instructed patient if CBG is less than 70 to drink 1/2 cup of a clear juice. Recheck CBG in 15 minutes then call pre- op desk at [phone] for further instructions.

## 2019-04-08 NOTE — Anesthesia Preprocedure Evaluation (Addendum)
Anesthesia Evaluation  Patient identified by MRN, date of birth, ID band Patient awake    Reviewed: Allergy & Precautions, NPO status , Patient's Chart, lab work & pertinent test results  Airway Mallampati: II  TM Distance: <3 FB Neck ROM: Full    Dental  (+) Upper Dentures, Lower Dentures   Pulmonary former smoker, neg PE   breath sounds clear to auscultation       Cardiovascular + Peripheral Vascular Disease   Rhythm:Regular Rate:Normal     Neuro/Psych negative neurological ROS  negative psych ROS   GI/Hepatic GERD  Medicated,  Endo/Other  diabetes, Type 2, Oral Hypoglycemic Agents  Renal/GU      Musculoskeletal  (+) Arthritis ,   Abdominal (+) - obese,   Peds  Hematology   Anesthesia Other Findings   Reproductive/Obstetrics                            EKG: NSR  Anesthesia Physical Anesthesia Plan  ASA: III  Anesthesia Plan: General   Post-op Pain Management:    Induction: Intravenous  PONV Risk Score and Plan: 3 and Ondansetron, Midazolam, Dexamethasone and Treatment may vary due to age or medical condition  Airway Management Planned: LMA  Additional Equipment: None  Intra-op Plan:   Post-operative Plan: Extubation in OR  Informed Consent: I have reviewed the patients History and Physical, chart, labs and discussed the procedure including the risks, benefits and alternatives for the proposed anesthesia with the patient or authorized representative who has indicated his/her understanding and acceptance.       Plan Discussed with: CRNA  Anesthesia Plan Comments:        Anesthesia Quick Evaluation

## 2019-04-09 ENCOUNTER — Encounter (HOSPITAL_COMMUNITY): Payer: Self-pay | Admitting: Surgery

## 2019-04-09 ENCOUNTER — Encounter (HOSPITAL_COMMUNITY)
Admission: RE | Disposition: A | Discharge status: Home / Self Care | Payer: Self-pay | Source: Home / Self Care | Attending: Vascular Surgery

## 2019-04-09 ENCOUNTER — Ambulatory Visit (HOSPITAL_COMMUNITY): Payer: Medicare HMO | Admitting: Certified Registered Nurse Anesthetist

## 2019-04-09 ENCOUNTER — Ambulatory Visit (HOSPITAL_COMMUNITY)
Admission: RE | Admit: 2019-04-09 | Discharge: 2019-04-09 | Disposition: A | Discharge status: Home / Self Care | Payer: Medicare HMO | Attending: Vascular Surgery | Admitting: Vascular Surgery

## 2019-04-09 DIAGNOSIS — K219 Gastro-esophageal reflux disease without esophagitis: Secondary | ICD-10-CM | POA: Insufficient documentation

## 2019-04-09 DIAGNOSIS — Z86711 Personal history of pulmonary embolism: Secondary | ICD-10-CM | POA: Insufficient documentation

## 2019-04-09 DIAGNOSIS — T8781 Dehiscence of amputation stump: Secondary | ICD-10-CM | POA: Diagnosis present

## 2019-04-09 DIAGNOSIS — Z7982 Long term (current) use of aspirin: Secondary | ICD-10-CM | POA: Diagnosis not present

## 2019-04-09 DIAGNOSIS — Z8249 Family history of ischemic heart disease and other diseases of the circulatory system: Secondary | ICD-10-CM | POA: Insufficient documentation

## 2019-04-09 DIAGNOSIS — Z9049 Acquired absence of other specified parts of digestive tract: Secondary | ICD-10-CM | POA: Insufficient documentation

## 2019-04-09 DIAGNOSIS — Z79899 Other long term (current) drug therapy: Secondary | ICD-10-CM | POA: Insufficient documentation

## 2019-04-09 DIAGNOSIS — Z9660 Presence of unspecified orthopedic joint implant: Secondary | ICD-10-CM | POA: Insufficient documentation

## 2019-04-09 DIAGNOSIS — Z87891 Personal history of nicotine dependence: Secondary | ICD-10-CM | POA: Diagnosis not present

## 2019-04-09 DIAGNOSIS — Z7951 Long term (current) use of inhaled steroids: Secondary | ICD-10-CM | POA: Insufficient documentation

## 2019-04-09 DIAGNOSIS — W19XXXA Unspecified fall, initial encounter: Secondary | ICD-10-CM | POA: Diagnosis not present

## 2019-04-09 DIAGNOSIS — M1990 Unspecified osteoarthritis, unspecified site: Secondary | ICD-10-CM | POA: Insufficient documentation

## 2019-04-09 DIAGNOSIS — E1151 Type 2 diabetes mellitus with diabetic peripheral angiopathy without gangrene: Secondary | ICD-10-CM | POA: Diagnosis not present

## 2019-04-09 HISTORY — PX: APPLICATION OF WOUND VAC: SHX5189

## 2019-04-09 HISTORY — DX: Personal history of urinary calculi: Z87.442

## 2019-04-09 HISTORY — PX: WOUND DEBRIDEMENT: SHX247

## 2019-04-09 HISTORY — DX: Malignant (primary) neoplasm, unspecified: C80.1

## 2019-04-09 HISTORY — DX: Joint disorder, unspecified: M25.9

## 2019-04-09 HISTORY — DX: Unspecified osteoarthritis, unspecified site: M19.90

## 2019-04-09 HISTORY — DX: Dyspnea, unspecified: R06.00

## 2019-04-09 LAB — POCT I-STAT, CHEM 8
BUN: 16 mg/dL (ref 8–23)
Calcium, Ion: 1.21 mmol/L (ref 1.15–1.40)
Chloride: 103 mmol/L (ref 98–111)
Creatinine, Ser: 0.9 mg/dL (ref 0.61–1.24)
Glucose, Bld: 130 mg/dL — ABNORMAL HIGH (ref 70–99)
HCT: 35 % — ABNORMAL LOW (ref 39.0–52.0)
Hemoglobin: 11.9 g/dL — ABNORMAL LOW (ref 13.0–17.0)
Potassium: 3.6 mmol/L (ref 3.5–5.1)
Sodium: 140 mmol/L (ref 135–145)
TCO2: 24 mmol/L (ref 22–32)

## 2019-04-09 LAB — GLUCOSE, CAPILLARY
Glucose-Capillary: 115 mg/dL — ABNORMAL HIGH (ref 70–99)
Glucose-Capillary: 127 mg/dL — ABNORMAL HIGH (ref 70–99)

## 2019-04-09 SURGERY — DEBRIDEMENT, WOUND
Anesthesia: General | Site: Leg Lower | Laterality: Left

## 2019-04-09 MED ORDER — OXYCODONE HCL 5 MG PO TABS: 5.0000 mg | ORAL_TABLET | Freq: Four times a day (QID) | ORAL | 0 refills | Status: DC | PRN

## 2019-04-09 MED ORDER — ONDANSETRON HCL 4 MG/2ML IJ SOLN
INTRAMUSCULAR | Status: AC
  Filled 2019-04-09: qty 2

## 2019-04-09 MED ORDER — ONDANSETRON HCL 4 MG/2ML IJ SOLN
INTRAMUSCULAR | Status: DC | PRN
  Administered 2019-04-09: 4 mg via INTRAVENOUS

## 2019-04-09 MED ORDER — PHENYLEPHRINE 40 MCG/ML (10ML) SYRINGE FOR IV PUSH (FOR BLOOD PRESSURE SUPPORT)
PREFILLED_SYRINGE | INTRAVENOUS | Status: DC | PRN
  Administered 2019-04-09: 80 ug via INTRAVENOUS

## 2019-04-09 MED ORDER — CEFAZOLIN SODIUM-DEXTROSE 2-4 GM/100ML-% IV SOLN
2.0000 g | INTRAVENOUS | Status: AC
  Administered 2019-04-09: 2 g via INTRAVENOUS
  Filled 2019-04-09: qty 100

## 2019-04-09 MED ORDER — LIDOCAINE 2% (20 MG/ML) 5 ML SYRINGE
INTRAMUSCULAR | Status: AC
  Filled 2019-04-09: qty 5

## 2019-04-09 MED ORDER — PROMETHAZINE HCL 25 MG/ML IJ SOLN: 6.2500 mg | INTRAMUSCULAR | Status: DC | PRN

## 2019-04-09 MED ORDER — FENTANYL CITRATE (PF) 100 MCG/2ML IJ SOLN: 25.0000 ug | INTRAMUSCULAR | Status: DC | PRN

## 2019-04-09 MED ORDER — MEPERIDINE HCL 25 MG/ML IJ SOLN: 6.2500 mg | INTRAMUSCULAR | Status: DC | PRN

## 2019-04-09 MED ORDER — LACTATED RINGERS IV SOLN: INTRAVENOUS | Status: DC

## 2019-04-09 MED ORDER — PROPOFOL 10 MG/ML IV BOLUS
INTRAVENOUS | Status: AC
  Filled 2019-04-09: qty 40

## 2019-04-09 MED ORDER — ACETAMINOPHEN 325 MG PO TABS: 325.0000 mg | ORAL_TABLET | Freq: Once | ORAL | Status: DC | PRN

## 2019-04-09 MED ORDER — LIDOCAINE 2% (20 MG/ML) 5 ML SYRINGE
INTRAMUSCULAR | Status: DC | PRN
  Administered 2019-04-09: 50 mg via INTRAVENOUS

## 2019-04-09 MED ORDER — OXYCODONE HCL 5 MG PO TABS
5.0000 mg | ORAL_TABLET | Freq: Once | ORAL | Status: AC
  Administered 2019-04-09: 09:00:00 5 mg via ORAL

## 2019-04-09 MED ORDER — CEPHALEXIN 500 MG PO CAPS: 500.0000 mg | ORAL_CAPSULE | Freq: Three times a day (TID) | ORAL | 0 refills | Status: DC

## 2019-04-09 MED ORDER — ACETAMINOPHEN 10 MG/ML IV SOLN: 1000.0000 mg | Freq: Once | INTRAVENOUS | Status: DC | PRN

## 2019-04-09 MED ORDER — FENTANYL CITRATE (PF) 250 MCG/5ML IJ SOLN
INTRAMUSCULAR | Status: AC
  Filled 2019-04-09: qty 5

## 2019-04-09 MED ORDER — LACTATED RINGERS IV SOLN
INTRAVENOUS | Status: DC | PRN
  Administered 2019-04-09: 07:00:00 via INTRAVENOUS

## 2019-04-09 MED ORDER — ACETAMINOPHEN 160 MG/5ML PO SOLN: 325.0000 mg | Freq: Once | ORAL | Status: DC | PRN

## 2019-04-09 MED ORDER — DEXAMETHASONE SODIUM PHOSPHATE 10 MG/ML IJ SOLN
INTRAMUSCULAR | Status: DC | PRN
  Administered 2019-04-09: 4 mg via INTRAVENOUS

## 2019-04-09 MED ORDER — OXYCODONE HCL 5 MG PO TABS
ORAL_TABLET | ORAL | Status: AC
  Filled 2019-04-09: qty 1

## 2019-04-09 MED ORDER — PROPOFOL 10 MG/ML IV BOLUS
INTRAVENOUS | Status: DC | PRN
  Administered 2019-04-09: 160 mg via INTRAVENOUS

## 2019-04-09 MED ORDER — FENTANYL CITRATE (PF) 100 MCG/2ML IJ SOLN
INTRAMUSCULAR | Status: DC | PRN
  Administered 2019-04-09 (×2): 50 ug via INTRAVENOUS

## 2019-04-09 MED ORDER — SODIUM CHLORIDE 0.9 % IV SOLN: INTRAVENOUS | Status: DC

## 2019-04-09 MED ORDER — PHENYLEPHRINE 40 MCG/ML (10ML) SYRINGE FOR IV PUSH (FOR BLOOD PRESSURE SUPPORT)
PREFILLED_SYRINGE | INTRAVENOUS | Status: AC
  Filled 2019-04-09: qty 10

## 2019-04-09 MED ORDER — 0.9 % SODIUM CHLORIDE (POUR BTL) OPTIME
TOPICAL | Status: DC | PRN
  Administered 2019-04-09: 07:00:00 1000 mL

## 2019-04-09 MED ORDER — DEXAMETHASONE SODIUM PHOSPHATE 10 MG/ML IJ SOLN
INTRAMUSCULAR | Status: AC
  Filled 2019-04-09: qty 1

## 2019-04-09 SURGICAL SUPPLY — 29 items
CANISTER SUCT 3000ML PPV (MISCELLANEOUS) ×3 IMPLANT
COVER SURGICAL LIGHT HANDLE (MISCELLANEOUS) ×3 IMPLANT
COVER WAND RF STERILE (DRAPES) ×1 IMPLANT
DRAPE INCISE IOBAN 66X45 STRL (DRAPES) ×3 IMPLANT
DRAPE ORTHO SPLIT 77X108 STRL (DRAPES) ×4
DRAPE SURG ORHT 6 SPLT 77X108 (DRAPES) ×1 IMPLANT
DRSG VAC ATS LRG SENSATRAC (GAUZE/BANDAGES/DRESSINGS) ×2 IMPLANT
DRSG VAC ATS MED SENSATRAC (GAUZE/BANDAGES/DRESSINGS) IMPLANT
DRSG VAC ATS SM SENSATRAC (GAUZE/BANDAGES/DRESSINGS) ×2 IMPLANT
ELECT REM PT RETURN 9FT ADLT (ELECTROSURGICAL) ×3
ELECTRODE REM PT RTRN 9FT ADLT (ELECTROSURGICAL) ×1 IMPLANT
GLOVE BIO SURGEON STRL SZ7.5 (GLOVE) ×3 IMPLANT
GLOVE BIOGEL PI IND STRL 8 (GLOVE) ×1 IMPLANT
GLOVE BIOGEL PI INDICATOR 8 (GLOVE) ×2
GOWN STRL REUS W/ TWL LRG LVL3 (GOWN DISPOSABLE) ×2 IMPLANT
GOWN STRL REUS W/ TWL XL LVL3 (GOWN DISPOSABLE) ×2 IMPLANT
GOWN STRL REUS W/TWL LRG LVL3 (GOWN DISPOSABLE) ×4
GOWN STRL REUS W/TWL XL LVL3 (GOWN DISPOSABLE) ×4
KIT BASIN OR (CUSTOM PROCEDURE TRAY) ×3 IMPLANT
KIT DRSG PREVENA PLUS 7DAY 125 (MISCELLANEOUS) ×2 IMPLANT
KIT REMOVER STAPLE SKIN (MISCELLANEOUS) ×2 IMPLANT
KIT TURNOVER KIT B (KITS) ×3 IMPLANT
NS IRRIG 1000ML POUR BTL (IV SOLUTION) ×3 IMPLANT
PACK GENERAL/GYN (CUSTOM PROCEDURE TRAY) ×3 IMPLANT
PAD ARMBOARD 7.5X6 YLW CONV (MISCELLANEOUS) ×6 IMPLANT
STAPLER VISISTAT 35W (STAPLE) IMPLANT
SUT ETHILON 2 0 PSLX (SUTURE) IMPLANT
TOWEL GREEN STERILE (TOWEL DISPOSABLE) ×3 IMPLANT
WATER STERILE IRR 1000ML POUR (IV SOLUTION) ×2 IMPLANT

## 2019-04-09 NOTE — H&P (Signed)
History and Physical Interval Note:  04/09/2019 7:28 AM  Rodney Sullivan  has presented today for surgery, with the diagnosis of wound dehiscence.  The various methods of treatment have been discussed with the patient and family. After consideration of risks, benefits and other options for treatment, the patient has consented to  Procedure(s): DEBRIDEMENT WOUND LEFT BELOW KNEE STUMP (Left) APPLICATION OF WOUND VAC (Left) as a surgical intervention.  The patient's history has been reviewed, patient examined, no change in status, stable for surgery.  I have reviewed the patient's chart and labs.  Questions were answered to the patient's satisfaction.    Debride left BKA with vac placement.  Rodney Sullivan  Patient name: Rodney Sullivan MRN: [id number]        DOB: 02/27/1941          Sex: male  REASON FOR VISIT: F/U L BKA after fall  HPI: Rodney Sullivan is a 78 y.o. male that presents for further follow-up after fall on left BKA last week.  He had a left BKA on 03/03/2019 for critical limb ischemia with tissue loss and had already undergone revascularization with left PT atherectomy.  Reports that this was healing accordingly until he fell last week out of his recliner.  Has noticed some swelling and bloody drainage from the stump at this time.    He has been on Keflex this past week for some redness as well.  No other systemic symptoms.        Past Medical History:  Diagnosis Date   Bacteremia due to Escherichia coli 11/2013   Crohn disease ([place])    Diabetes mellitus without complication ([place])    DM (diabetes mellitus), type 2 with peripheral vascular complications ([place]) 0/37/0488   DVT (deep venous thrombosis) ([place]) 11/27/13   History of skin cancer    [facility]   Peripheral arterial disease ([place]) 02/15/2019   Pulmonary embolism ([place]) 11/27/13          Past Surgical History:  Procedure Laterality Date   ABDOMINAL AORTOGRAM N/A 12/14/2018   Procedure: ABDOMINAL  AORTOGRAM;  Surgeon: Waynetta Sandy, MD;  Location: [facility];  Service: Cardiovascular;  Laterality: N/A;   ABDOMINAL AORTOGRAM W/LOWER EXTREMITY Bilateral 02/17/2019   Procedure: ABDOMINAL AORTOGRAM W/LOWER EXTREMITY;  Surgeon: Rodney Heck, MD;  Location: [facility];  Service: Cardiovascular;  Laterality: Bilateral;   ABDOMINAL SURGERY     Bowel resection x2   AMPUTATION Right 12/21/2018   Procedure: RIGHT LONG FINGER AMPUTATION;  Surgeon: Leanora Cover, MD;  Location: [facility];  Service: Orthopedics;  Laterality: Right;   AMPUTATION Left 03/03/2019   Procedure: AMPUTATION BELOW KNEE;  Surgeon: Rodney Heck, MD;  Location: [facility];  Service: Vascular;  Laterality: Left;   CHOLECYSTECTOMY     COLON SURGERY     HIP SURGERY     BOTH   JOINT REPLACEMENT     LOWER EXTREMITY ANGIOGRAPHY Right 12/14/2018   Procedure: LOWER EXTREMITY ANGIOGRAPHY;  Surgeon: Waynetta Sandy, MD;  Location: [facility];  Service: Cardiovascular;  Laterality: Right;   PERIPHERAL VASCULAR ATHERECTOMY  02/17/2019   Procedure: PERIPHERAL VASCULAR ATHERECTOMY;  Surgeon: Rodney Heck, MD;  Location: [facility];  Service: Cardiovascular;;   SHOULDER SURGERY     RIGHT TENDON DETACHED   UPPER EXTREMITY ANGIOGRAPHY Right 12/14/2018   Procedure: Right UPPER EXTREMITY ANGIOGRAPHY;  Surgeon: Waynetta Sandy, MD;  Location: [facility];  Service:  Cardiovascular;  Laterality: Right;         Family History  Problem Relation Age of Onset   Hypertension Mother     SOCIAL HISTORY: Social History        Tobacco Use   Smoking status: Former Smoker    Years: 10.00    Types: Pipe   Smokeless tobacco: Former Systems developer    Quit date: 12/08/1979  Substance Use Topics   Alcohol use: No    Alcohol/week: 0.0 standard drinks         Allergies  Allergen Reactions   Ciprofloxacin  Other (See Comments)    tendons hurt   Shrimp [Shellfish Allergy]           Current Outpatient Medications  Medication Sig Dispense Refill   albuterol (VENTOLIN HFA) 108 (90 Base) MCG/ACT inhaler Inhale 2 puffs into the lungs every 4 (four) hours as needed for wheezing or shortness of breath.      allopurinol (ZYLOPRIM) 100 MG tablet Take 100 mg by mouth daily.     amLODipine (NORVASC) 2.5 MG tablet Take 2.5 mg by mouth daily with supper.      aspirin EC 81 MG tablet Take 81 mg by mouth daily with supper.      benzonatate (TESSALON) 100 MG capsule Take 100 mg by mouth 3 (three) times daily as needed for cough.     brimonidine-timolol (COMBIGAN) 0.2-0.5 % ophthalmic solution Place 1 drop into both eyes 2 (two) times a day.     cephALEXin (KEFLEX) 500 MG capsule Take 1 capsule (500 mg total) by mouth 3 (three) times daily. 21 capsule 0   Certolizumab Pegol (CIMZIA) 2 X 200 MG KIT Inject 400 mg into the skin every 28 (twenty-eight) days.     Cholecalciferol (VITAMIN D) 50 MCG (2000 UT) tablet Take 2,000 Units by mouth daily.     clotrimazole-betamethasone (LOTRISONE) cream Apply 1 application topically 2 (two) times daily as needed (fungus). On the side of mouth     Cyanocobalamin (B-12) 2500 MCG TABS Take 2,500 mcg by mouth daily.     cyclobenzaprine (FLEXERIL) 10 MG tablet Take 10 mg by mouth daily as needed for muscle spasms.     Dexamethasone Acetate 8 MG/ML SUSP Inject 8 mg into the muscle daily as needed (for Crohn's flare).     diphenhydrAMINE (BENADRYL) 25 MG tablet Take 25 mg by mouth 2 (two) times daily as needed for allergies or sleep.      Emollient (ZIMS CRACK CREME) CREA Apply 1 application topically daily as needed (dry skin).      famciclovir (FAMVIR) 250 MG tablet Take 250 mg by mouth daily as needed (fungus on corner of mouth flare).      ferrous sulfate 325 (65 FE) MG tablet Take 325 mg by mouth daily.     ketoconazole  (NIZORAL) 200 MG tablet Take 200 mg by mouth daily as needed (yeast infection).     latanoprost (XALATAN) 0.005 % ophthalmic solution Place 1 drop into both eyes at bedtime.     LORazepam (ATIVAN) 0.5 MG tablet Take 0.5 mg by mouth as needed for anxiety.     mesalamine (PENTASA) 500 MG CR capsule Take 2,000 mg by mouth 2 (two) times daily.     Multiple Vitamin (MULTIVITAMIN WITH MINERALS) TABS Take 1 tablet by mouth daily.     Omega-3 Fatty Acids (FISH OIL) 1000 MG CAPS Take 1,000 mg by mouth daily.     OVER THE COUNTER MEDICATION Take 5  mLs by mouth 2 (two) times daily. CBD oil drops     oxyCODONE (OXY IR/ROXICODONE) 5 MG immediate release tablet Take 1 tablet (5 mg total) by mouth every 6 (six) hours as needed for severe pain. 10 tablet 0   pantoprazole (PROTONIX) 40 MG tablet Take 40 mg by mouth daily at 3 pm.      pioglitazone (ACTOS) 15 MG tablet Take 15 mg by mouth daily.      predniSONE (DELTASONE) 10 MG tablet Take 22.5 mg by mouth daily after breakfast.      traMADol (ULTRAM) 50 MG tablet Take 1 tablet (50 mg total) by mouth at bedtime as needed for moderate pain.     pravastatin (PRAVACHOL) 10 MG tablet Take 1 tablet (10 mg total) by mouth every evening. 30 tablet 0   No current facility-administered medications for this visit.     REVIEW OF SYSTEMS:  [X]  denotes positive finding, [ ]  denotes negative finding Cardiac  Comments:  Chest pain or chest pressure:    Shortness of breath upon exertion:    Short of breath when lying flat:    Irregular heart rhythm:        Vascular    Pain in calf, thigh, or hip brought on by ambulation:    Pain in feet at night that wakes you up from your sleep:     Blood clot in your veins:    Leg swelling:         Pulmonary    Oxygen at home:    Productive cough:     Wheezing:         Neurologic    Sudden weakness in arms or legs:     Sudden numbness in arms or legs:      Sudden onset of difficulty speaking or slurred speech:    Temporary loss of vision in one eye:     Problems with dizziness:         Gastrointestinal    Blood in stool:     Vomited blood:         Genitourinary    Burning when urinating:     Blood in urine:        Psychiatric    Major depression:         Hematologic    Bleeding problems:    Problems with blood clotting too easily:        Skin    Rashes or ulcers:        Constitutional    Fever or chills:      PHYSICAL EXAM:    Vitals:   04/06/19 0827  BP: 127/73  Pulse: 97  Resp: 16  Temp: 97.8 F (36.6 C)  TempSrc: Temporal  SpO2: 96%  Weight: 156 lb 12.8 oz (71.1 kg)  Height: 5' 7"  (1.702 m)    GENERAL: The patient is a well-nourished male, in no acute distress. The vital signs are documented above. CARDIAC: There is a regular rate and rhythm.  VASCULAR:  Left BKA as pictured below Wound dehisced after fall as pictured below        DATA:   None  Assessment/Plan:  78 year old male status post recent BKA that presented last week as triage visit after fall on his BKA.  The wound continues to dehisce following his traumatic fall on the stump.  He is in wet-to-dry dressings at this time with home health.  I think the wound needs to be debrided in the operating room  to try and salvage his BKA and plan for [facility] placement.  We will plan to do this on Friday as an outpatient.  He should be able to go home afterwards.  We will arrange home health with twice weekly VAC changes.  Discussed risk that his BKA does not heal and he will need an above-knee amputation.  I will remove his staples in the OR on Friday.  He can stop the Plavix and just do aspirin daily.  Rodney Heck, MD [facility] Office: [phone] Pager: [phone]

## 2019-04-09 NOTE — Op Note (Signed)
Date: April 09, 2019  Preoperative diagnosis: Nonhealing left below-knee amputation after mechanical fall at home  Postoperative diagnosis: Same  Procedure:  1.  Left below-knee amputation sharp excisional debridement (3 cm x 4 cm) 2.  Negative pressure wound VAC placement to open wound on left below knee amputation  Surgeon: Dr. [name], MD  Assistant: OR staff  Indications: Patient is a 78 year old male who previously underwent a left below-knee amputation.  Ultimately he was denied admission to rehab by his insurance company even though therapy thought he would be a great candidate.  He was subsequently discharged home and had a mechanical fall at home and a dehiscence of his below-knee amputation.  He presents today for debridement of his below-knee amputation wound and negative pressure wound VAC placement after risks benefits were discussed.  Endings: Sharp excisional debridement of left below-knee amputation dehiscence where the wound measured approximately 3 cm x 4 cm.  Underlying muscle and soft tissue was marginal.  I did remove the staples in the OR as well.  Small black sponge was placed in the wound.  Anesthesia: LMA  Details: Patient was taken to the operating room after informed consent was obtained.  He was placed on operative table in supine position.  Anesthesia induced with LMA.  Left leg was then prepped and draped usual sterile fashion after his previous wet to dry dressing was removed.  Initially removed all the staples from the below knee amputation.  The open wound where it was dehisced was then debrided sharply with a scalpel and Metzenbaum scissors until I got down to healthy-appearing tissue.  There was no overt bone exposed and remained muscle coverage over the bone.  Tissue was marginal.  This was copiously irrigated until the effluent was clear.  I then put a small black sponge in the wound and this was hooked up to a Praveena wound VAC.  He will be  sent home with transition to a KCI VAC.  He tolerated the procedure without any apparent complications.  Complications: None  Condition: Stable  [name], MD [facility] Office: [phone] Pager: [place]

## 2019-04-09 NOTE — Anesthesia Procedure Notes (Signed)
Procedure Name: LMA Insertion Date/Time: 04/09/2019 7:44 AM Performed by: Colin Benton, CRNA Pre-anesthesia Checklist: Patient identified, Emergency Drugs available, Suction available and Patient being monitored Patient Re-evaluated:Patient Re-evaluated prior to induction Oxygen Delivery Method: Circle system utilized Preoxygenation: Pre-oxygenation with 100% oxygen Induction Type: IV induction Ventilation: Mask ventilation without difficulty LMA: LMA inserted LMA Size: 4.0 Placement Confirmation: positive ETCO2 and breath sounds checked- equal and bilateral Tube secured with: Tape Dental Injury: Teeth and Oropharynx as per pre-operative assessment

## 2019-04-09 NOTE — Anesthesia Postprocedure Evaluation (Signed)
Anesthesia Post Note  Patient: Pasha Broad  Procedure(s) Performed: DEBRIDEMENT WOUND LEFT BELOW KNEE STUMP (Left Leg Lower) APPLICATION OF WOUND VAC (Left Leg Lower)     Patient location during evaluation: PACU Anesthesia Type: General Level of consciousness: awake and alert Pain management: pain level controlled Vital Signs Assessment: post-procedure vital signs reviewed and stable Respiratory status: spontaneous breathing, nonlabored ventilation, respiratory function stable and patient connected to nasal cannula oxygen Cardiovascular status: blood pressure returned to baseline and stable Postop Assessment: no apparent nausea or vomiting Anesthetic complications: no    Last Vitals:  Vitals:   04/09/19 0915 04/09/19 0924  BP:  (!) 148/81  Pulse:  84  Resp:  15  Temp: 36.5 C 36.5 C  SpO2:  97%                Effie Berkshire

## 2019-04-09 NOTE — Transfer of Care (Signed)
Immediate Anesthesia Transfer of Care Note  Patient: Manjot Hinks  Procedure(s) Performed: DEBRIDEMENT WOUND LEFT BELOW KNEE STUMP (Left Leg Lower) APPLICATION OF WOUND VAC (Left Leg Lower)  Patient Location: PACU  Anesthesia Type:General  Level of Consciousness: drowsy and patient cooperative  Airway & Oxygen Therapy: Patient Spontanous Breathing and Patient connected to nasal cannula oxygen  Post-op Assessment: Report given to RN and Post -op Vital signs reviewed and stable  Post vital signs: Reviewed and stable  Last Vitals:  Vitals Value Taken Time  BP 164/90 04/09/19 0829  Temp    Pulse 89 04/09/19 0830  Resp 19 04/09/19 0830  SpO2 100 % 04/09/19 0830  Vitals shown include unvalidated device data.  Last Pain:  Vitals:   04/09/19 0650  PainSc: 0-No pain      Patients Stated Pain Goal: 3 (44/81/85 [id number])  Complications: No apparent anesthesia complications

## 2019-04-09 NOTE — Discharge Instructions (Addendum)
Negative Pressure Wound Therapy Home Guide Negative pressure wound therapy (NPWT) uses a sponge or foam-like material (dressing) placed on or inside the wound. The wound is then covered and sealed with a cover dressing that sticks to your skin (is adhesive). This keeps air out. A tube is attached to the cover dressing, and this tube connects to a small pump. The pump sucks fluid and germs from the wound. NPWT helps to increase blood flow to the wound and heal it from the inside. What are the risks? NPWT is usually safe to use. However, problems can occur, including:  Skin irritation from the dressing adhesive.  Bleeding.  Infection.  Dehydration. Wounds with large amounts of drainage can cause excessive fluid loss.  Pain. Supplies needed:  A disposable garbage bag.  Soap and water, or hand sanitizer.  Wound cleanser or salt-water solution (saline).  New sponge and cover dressing.  Protective clothing.  Gauze pad.  Vinyl gloves.  Tape.  Skin protectant. This may be a wipe, film, or spray.  Clean or germ-free (sterile) scissors.  Eye protection. How to change your dressing Prepare to change your dressing  1. If told by your health care provider, take pain medicine 30 minutes before changing the dressing. 2. Wash your hands with soap and water. Dry your hands with a clean towel. If soap and water are not available, use hand sanitizer. 3. Set up a clean station for wound care. 4. Open the dressing package so that the sponge dressing remains on the inside of the package. 5. Wear gloves, protective clothing, and eye protection. Remove old dressing  1. Turn off the pump and disconnect the tubing from the dressing. 2. Carefully remove the adhesive cover dressing in the direction of your hair growth. 3. Remove the sponge dressing that is inside the wound. If the sponge sticks, use a wound cleanser or saline solution to wet the sponge and help it come off more easily. 4. Throw  the old sponge and cover dressing supplies into the garbage bag. 5. Remove your gloves by grabbing the cuff and turning the glove inside out. Place the gloves in the trash immediately. 6. Wash your hands with soap and water. Dry your hands with a clean towel. If soap and water are not available, use hand sanitizer. Clean your wound  Wear gloves, protective clothing, and eye protection. Follow your health care provider's instructions on how to clean your wound. You may be told to: 1. Clean the wound using a saline solution or a wound cleanser and a clean gauze pad. 2. Pat the wound dry with a gauze pad. Do not rub the wound. 3. Throw the gauze pad into the garbage bag. 4. Remove your gloves by grabbing the cuff and turning the glove inside out. Place the gloves in the trash immediately. 5. Wash your hands with soap and water. Dry your hands with a clean towel. If soap and water are not available, use hand sanitizer. Apply new dressing  Wear gloves, protective clothing, and eye protection. 1. If told by your health care provider, apply a skin protectant to any skin that will be exposed to adhesive. Let the skin protectant dry. 2. Cut a piece of new sponge dressing and put it on or in the wound. 3. Using clean scissors, cut a nickel-sized hole in the new cover dressing. 4. Apply the cover dressing. 5. Attach the suction tube over the hole in the cover dressing. 6. Take off your gloves. Put them in the  plastic bag with the old dressing. Tie the bag shut and throw it away. 7. Wash your hands with soap and water. Dry your hands with a clean towel. If soap and water are not available, use hand sanitizer. 8. Turn the pump back on. The sponge dressing should collapse. Do not change the settings on the machine without talking to a health care provider. 9. Replace the container in the pump that collects fluid if it is full. Replace the container per the manufacturer's instructions or at least once a  week, even if it is not full. General tips and recommendations If the alarm sounds:  Stay calm.  Do not turn off the pump or do anything with the dressing.  Reasons the alarm may go off: ? The battery is low. Change the battery or plug the device into electrical power. ? The dressing has a leak. Find the leak and put tape over the leak. ? The fluid collection container is full. Change the fluid container.  Call your health care provider right away if you cannot fix the problem.  Explain to your health care provider what is happening. Follow his or her instructions. General instructions  Do not turn off the pump unless told to do so by your health care provider.  Do not turn off the pump for more than 2 hours. If the pump is off for more than 2 hours, the dressing will need to be changed.  If your health care provider says it is okay to shower: ? Do not take the pump into the shower. ? Make sure the wound dressing is protected and sealed. The wound dressing must stay dry.  Check frequently that the machine indicates that therapy is on and that all clamps are open.  Do not use over-the-counter medicated or antiseptic creams, sprays, liquids, or dressings unless your health care provider approves. Contact a health care provider if:  You have new pain.  You develop irritation, a rash, or itching around the wound or dressing.  You see new black or yellow tissue in your wound.  The dressing changes are painful or cause bleeding.  The pump has been off for more than 2 hours, and you do not know how to change the dressing.  The pump alarm goes off, and you do not know what to do. Get help right away if:  You have a lot of bleeding.  The wound breaks open.  You have severe pain.  You have signs of infection, such as: ? More redness, swelling, or pain. ? More fluid or blood. ? Warmth. ? Pus or a bad smell. ? Red streaks leading from the wound. ? A fever.  You see a  sudden change in the color or texture of the drainage.  You have signs of dehydration, such as: ? Little or no tears, urine, or sweat. ? Muscle cramps. ? Very dry mouth. ? Headache. ? Dizziness. Summary  Negative pressure wound therapy (NPWT) is a device that helps your wound heal.  Set up a clean station for wound care. Your health care provider will tell you what supplies to use.  Follow your health care provider's instructions on how to clean your wound and how to change the dressing.  Contact a health care provider if you have new pain, an irritation, or a rash, or if the alarm goes off and you do not know what to do.  Get help right away if you have a lot of bleeding, your wound breaks  open, or you have severe pain. Also, get help if you have signs of infection. This information is not intended to replace advice given to you by your health care provider. Make sure you discuss any questions you have with your health care provider. Document Released: 09/16/2011 Document Revised: 10/16/2018 Document Reviewed: 09/11/2018 Elsevier Patient Education  [place].

## 2019-04-10 ENCOUNTER — Encounter (HOSPITAL_COMMUNITY): Payer: Self-pay | Admitting: Vascular Surgery

## 2019-04-14 ENCOUNTER — Telehealth: Payer: Self-pay | Admitting: Vascular Surgery

## 2019-04-14 NOTE — Telephone Encounter (Signed)
Rodney Sullivan says patient is using a wound vac with black foam.  Black foam is contra-indication for use on bone.  She will use Adaptic under the black foam.  Pt has an upcoming appt on 04/27/19.  Thurston Hole., LPN

## 2019-04-14 NOTE — Telephone Encounter (Signed)
-----   Message from [name], MD sent at 04/14/2019  3:09 PM EDT ----- Black sponge is fine.  He should be in a vac now.  Can you make sure he has a followup with me in a couple weeks to see how it is healing.  High risk for conversion to AKA.  Thanks,  Gerald Stabs ----- Message ----- From: Kaleen Mask, LPN Sent: [id number]   2:16 PM EDT To: [name], MD  Hey Dr. Carlis Abbott. Denise with [facility] called and left voice message regarding this patient. Patient has "exposed bone" and has been using adaptic.  Do you want order changed to white foam or continue using black foam?  Please advise.  Thanks,   Thurston Hole., LPN

## 2019-04-26 ENCOUNTER — Emergency Department (HOSPITAL_COMMUNITY): Payer: Medicare HMO

## 2019-04-26 ENCOUNTER — Other Ambulatory Visit: Payer: Self-pay

## 2019-04-26 ENCOUNTER — Emergency Department (HOSPITAL_COMMUNITY)
Admission: EM | Admit: 2019-04-26 | Discharge: 2019-04-26 | Disposition: A | Discharge status: Home / Self Care | Payer: Medicare HMO | Source: Home / Self Care | Attending: Emergency Medicine | Admitting: Emergency Medicine

## 2019-04-26 ENCOUNTER — Encounter (HOSPITAL_COMMUNITY): Payer: Self-pay

## 2019-04-26 DIAGNOSIS — Z79899 Other long term (current) drug therapy: Secondary | ICD-10-CM | POA: Insufficient documentation

## 2019-04-26 DIAGNOSIS — R1031 Right lower quadrant pain: Secondary | ICD-10-CM

## 2019-04-26 DIAGNOSIS — R7881 Bacteremia: Secondary | ICD-10-CM | POA: Diagnosis not present

## 2019-04-26 DIAGNOSIS — A4151 Sepsis due to Escherichia coli [E. coli]: Secondary | ICD-10-CM | POA: Diagnosis not present

## 2019-04-26 DIAGNOSIS — Z86711 Personal history of pulmonary embolism: Secondary | ICD-10-CM | POA: Insufficient documentation

## 2019-04-26 DIAGNOSIS — Z20828 Contact with and (suspected) exposure to other viral communicable diseases: Secondary | ICD-10-CM | POA: Insufficient documentation

## 2019-04-26 DIAGNOSIS — Z87891 Personal history of nicotine dependence: Secondary | ICD-10-CM | POA: Insufficient documentation

## 2019-04-26 DIAGNOSIS — Z86718 Personal history of other venous thrombosis and embolism: Secondary | ICD-10-CM | POA: Insufficient documentation

## 2019-04-26 DIAGNOSIS — E119 Type 2 diabetes mellitus without complications: Secondary | ICD-10-CM | POA: Insufficient documentation

## 2019-04-26 DIAGNOSIS — Z7982 Long term (current) use of aspirin: Secondary | ICD-10-CM | POA: Insufficient documentation

## 2019-04-26 LAB — LACTIC ACID, PLASMA
Lactic Acid, Venous: 4.2 mmol/L (ref 0.5–1.9)
Lactic Acid, Venous: 3.6 mmol/L (ref 0.5–1.9)

## 2019-04-26 LAB — URINALYSIS, ROUTINE W REFLEX MICROSCOPIC
Bilirubin Urine: NEGATIVE
Glucose, UA: NEGATIVE mg/dL
Hgb urine dipstick: NEGATIVE
Ketones, ur: NEGATIVE mg/dL
Leukocytes,Ua: NEGATIVE
Nitrite: NEGATIVE
Protein, ur: NEGATIVE mg/dL
Specific Gravity, Urine: 1.016 (ref 1.005–1.030)
pH: 5 (ref 5.0–8.0)

## 2019-04-26 LAB — LIPASE, BLOOD: Lipase: 37 U/L (ref 11–51)

## 2019-04-26 LAB — COMPREHENSIVE METABOLIC PANEL
ALT: 23 U/L (ref 0–44)
AST: 32 U/L (ref 15–41)
Albumin: 3.4 g/dL — ABNORMAL LOW (ref 3.5–5.0)
Alkaline Phosphatase: 77 U/L (ref 38–126)
Anion gap: 14 (ref 5–15)
BUN: 17 mg/dL (ref 8–23)
CO2: 22 mmol/L (ref 22–32)
Calcium: 9.2 mg/dL (ref 8.9–10.3)
Chloride: 103 mmol/L (ref 98–111)
Creatinine, Ser: 0.97 mg/dL (ref 0.61–1.24)
GFR calc Af Amer: >60 mL/min (ref >60)
GFR calc non Af Amer: >60 mL/min (ref >60)
Glucose, Bld: 253 mg/dL — ABNORMAL HIGH (ref 70–99)
Potassium: 4 mmol/L (ref 3.5–5.1)
Sodium: 139 mmol/L (ref 135–145)
Total Bilirubin: 0.4 mg/dL (ref 0.3–1.2)
Total Protein: 6.5 g/dL (ref 6.5–8.1)

## 2019-04-26 LAB — CBC WITH DIFFERENTIAL/PLATELET
Abs Immature Granulocytes: 0.13 10*3/uL — ABNORMAL HIGH (ref 0.00–0.07)
Basophils Absolute: 0 10*3/uL (ref 0.0–0.1)
Basophils Relative: 0 %
Eosinophils Absolute: 0 10*3/uL (ref 0.0–0.5)
Eosinophils Relative: 0 %
HCT: 41.5 % (ref 39.0–52.0)
Hemoglobin: 12.3 g/dL — ABNORMAL LOW (ref 13.0–17.0)
Immature Granulocytes: 1 %
Lymphocytes Relative: 6 %
Lymphs Abs: 1.1 10*3/uL (ref 0.7–4.0)
MCH: 25.9 pg — ABNORMAL LOW (ref 26.0–34.0)
MCHC: 29.6 g/dL — ABNORMAL LOW (ref 30.0–36.0)
MCV: 87.6 fL (ref 80.0–100.0)
Monocytes Absolute: 0.7 10*3/uL (ref 0.1–1.0)
Monocytes Relative: 4 %
Neutro Abs: 17.1 10*3/uL — ABNORMAL HIGH (ref 1.7–7.7)
Neutrophils Relative %: 89 %
Platelets: 389 10*3/uL (ref 150–400)
RBC: 4.74 MIL/uL (ref 4.22–5.81)
RDW: 14.6 % (ref 11.5–15.5)
WBC: 19.1 10*3/uL — ABNORMAL HIGH (ref 4.0–10.5)
nRBC: 0.1 % (ref 0.0–0.2)

## 2019-04-26 LAB — PROTIME-INR
INR: 1 (ref 0.8–1.2)
Prothrombin Time: 13.1 seconds (ref 11.4–15.2)

## 2019-04-26 LAB — SARS CORONAVIRUS 2 BY RT PCR (HOSPITAL ORDER, PERFORMED IN ~~LOC~~ HOSPITAL LAB): SARS Coronavirus 2: NEGATIVE

## 2019-04-26 LAB — APTT: aPTT: 36 seconds (ref 24–36)

## 2019-04-26 MED ORDER — DEXAMETHASONE SODIUM PHOSPHATE 10 MG/ML IJ SOLN
10.0000 mg | Freq: Once | INTRAMUSCULAR | Status: AC
  Administered 2019-04-26: 10 mg via INTRAVENOUS
  Filled 2019-04-26: qty 1

## 2019-04-26 MED ORDER — SODIUM CHLORIDE (PF) 0.9 % IJ SOLN
INTRAMUSCULAR | Status: AC
  Filled 2019-04-26: qty 50

## 2019-04-26 MED ORDER — SODIUM CHLORIDE 0.9 % IV BOLUS (SEPSIS)
250.0000 mL | Freq: Once | INTRAVENOUS | Status: AC
  Administered 2019-04-26: 250 mL via INTRAVENOUS

## 2019-04-26 MED ORDER — SODIUM CHLORIDE 0.9 % IV BOLUS (SEPSIS)
1000.0000 mL | Freq: Once | INTRAVENOUS | Status: AC
  Administered 2019-04-26: 1000 mL via INTRAVENOUS

## 2019-04-26 MED ORDER — SODIUM CHLORIDE 0.9 % IV BOLUS
500.0000 mL | Freq: Once | INTRAVENOUS | Status: AC
  Administered 2019-04-26: 14:00:00 500 mL via INTRAVENOUS

## 2019-04-26 MED ORDER — SODIUM CHLORIDE 0.9 % IV SOLN
2.0000 g | Freq: Once | INTRAVENOUS | Status: AC
  Administered 2019-04-26: 2 g via INTRAVENOUS
  Filled 2019-04-26: qty 2

## 2019-04-26 MED ORDER — FENTANYL CITRATE (PF) 100 MCG/2ML IJ SOLN
50.0000 ug | Freq: Once | INTRAMUSCULAR | Status: AC
  Administered 2019-04-26: 50 ug via INTRAVENOUS
  Filled 2019-04-26: qty 2

## 2019-04-26 MED ORDER — IOHEXOL 300 MG/ML  SOLN
100.0000 mL | Freq: Once | INTRAMUSCULAR | Status: AC | PRN
  Administered 2019-04-26: 100 mL via INTRAVENOUS

## 2019-04-26 MED ORDER — METRONIDAZOLE IN NACL 5-0.79 MG/ML-% IV SOLN
500.0000 mg | Freq: Once | INTRAVENOUS | Status: AC
  Administered 2019-04-26: 500 mg via INTRAVENOUS
  Filled 2019-04-26: qty 100

## 2019-04-26 NOTE — Progress Notes (Signed)
A consult was received from an ED physician for cefepime per pharmacy dosing.  The patient's profile has been reviewed for ht/wt/allergies/indication/available labs.    A one time order has been placed for cefepime 2 g IV once.    Further antibiotics/pharmacy consults should be ordered by admitting physician if indicated.                       Thank you, Lenis Noon, PharmD 04/26/2019  3:02 PM

## 2019-04-26 NOTE — ED Notes (Signed)
Charge nurse made aware of Elink recommendations and pt vitals and the plan in ED to discharge Pt.

## 2019-04-26 NOTE — Discharge Instructions (Addendum)
Testing today is reassuring and did not show evidence for Crohn's flare.  You have received antibiotics, single dose, but do not appear to need additional treatment.  We gave you a dose of dexamethasone, to hopefully maintain your intestinal tract without worsening pain.  Follow-up with your vascular doctor tomorrow, as scheduled.  Return here, if needed, for problems.

## 2019-04-26 NOTE — ED Notes (Signed)
Date and time results received: 04/26/19 2:47 PM   Test: lactic acid Critical Value: 4.2  Name of Provider Notified: Melina Copa MD  Orders Received? Or Actions Taken?: acknowledges result

## 2019-04-26 NOTE — ED Provider Notes (Addendum)
4:45 PM-check out to evaluate after return of CT imaging, regarding suspected acute infection, with possible intra-abdominal component.  He was recently hospitalized, for debridement of left below-knee amputation stump, and currently has a wound VAC.  Clinical Course as of Apr 25 2002  Mon Apr 26, 2019  1347 Patient's wife here now and states he was fine this morning.  He then had shaking chills and acute onset of the right lower quadrant abdominal pain.  She said he is more lethargic.  The visiting nurse for his amputation wound came by and changed his dressing and felt that the wound looked fine.   [MB]  8768 Differential diagnosis includes sepsis, abdominal infection, pneumonia, Covid, Pilo, renal colic, vascular catastrophe.   [MB]  1157 Received a call the patient's lactate was elevated at 4.6.  I have activated sepsis pathway and have ordered antibiotics.  Fluids infusing.   [MB]  2620 Patient's labs still have not resulted yet.  I went back and checked on him he is more alert and is got better color.  Michela Pitcher he is time for something to drink but understands that we cannot give him anything to eat or drink until we get some more test back.  Nurses can get him some oral swabs.   [MB]  3559 Patient's lab still not resulted.  I called down to the lab and they have results down there that have not crossed into epic.  We are attempted to get those results so we can proceed with his work-up.   [MB]  7416 Lab was able to print a copy of the labs.  He is got a white blood cell count is elevated at 19.  Hemoglobin 12.  Normal platelets.  Chemistries normal except for an elevated glucose of 253.  Normal BUN and creatinine.  Normal LFTs.  INR is 1.0.  Urinalysis unremarkable.   [MB]  1837 Normal  SARS Coronavirus 2 by RT PCR (hospital order, performed in [facility] hospital lab) Nasopharyngeal Nasopharyngeal Swab [EW]  1838 Elevated, but improving lactate.  Post IV fluid resuscitation.  Lactic acid,  plasma(!!) [EW]  1838 Elevated, but improving heart rate post IV fluid resuscitation.  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  CT Abdomen Pelvis W Contrast [EW]    Clinical Course User Index [EW] Daleen Bo, MD [MB] Hayden Rasmussen, MD   Medications  sodium chloride (PF) 0.9 % injection (has no administration in time range)  sodium chloride 0.9 % bolus 500 mL (0 mLs Intravenous Stopped 04/26/19 1625)  fentaNYL (SUBLIMAZE) injection 50 mcg (50 mcg Intravenous Given 04/26/19 1414)  sodium chloride 0.9 % bolus 1,000 mL (0 mLs Intravenous Stopped 04/26/19 1632)    And  sodium chloride 0.9 % bolus 250 mL (0 mLs Intravenous Stopped 04/26/19 1632)  ceFEPIme (MAXIPIME) 2 g in sodium chloride 0.9 % 100 mL IVPB (0 g Intravenous Stopped 04/26/19 1632)  metroNIDAZOLE (FLAGYL) IVPB 500 mg (0 mg Intravenous Stopped 04/26/19 1903)  iohexol (OMNIPAQUE) 300 MG/ML solution 100 mL (100 mLs Intravenous Contrast Given 04/26/19 1721)  dexamethasone (DECADRON) injection 10 mg (10 mg Intravenous Given 04/26/19 1949)     Patient Vitals for the past 24 hrs:  BP Temp Temp src Pulse Resp SpO2 Height  04/26/19 2000 103/62 - - - - - -  04/26/19 1924 103/64 - - (!) 101 20 100 % -  04/26/19 1909 105/66 - - 100 18 97 % -  04/26/19 1905 105/66 - - 99 - 94 % -  04/26/19 1650 - - - (!) 103 20 97 % -  04/26/19 1645 103/63 - - (!) 103 15 97 % -  04/26/19 1515 112/67 - - (!) 112 (!) 22 98 % -  04/26/19 1500 - - - (!) 112 - 98 % -  04/26/19 1327 - - - - - - 5' 7"  (1.702 m)  04/26/19 1320 (!) 143/70 98.1 F (36.7 C) Oral 69 18 90 % -    6:48 PM Reevaluation with update and discussion. After initial assessment and treatment, an updated evaluation reveals patient states his pain has  completely resolved and his wife states his mental status has returned to normal, shortly after he received IV fluids, here.  Patient states he typically gets a injection of dexamethasone when he has "flares," like this.  Last flare was in June 2020.  He states that this time he feels better and wants to go home.  Findings discussed and questions answered. Daleen Bo   Medical Decision Making: Abdominal pain, spontaneous resolved, with reassuring evaluation.  Testing is normal with exception of elevated lactate however this has been elevated in this range, for 1 month.  Patient has a resolving nonhealing wound, being treated currently with wound VAC.  He has an appointment with his vascular surgeon, tomorrow morning.  There is not appear to be any signs of acute sepsis, metabolic instability or suggestion for impending vascular collapse.  CT did not show Crohn's flare, and appeared stable.  He does not require ongoing treatment with antibiotics.  He and his wife were interested in getting a dexamethasone injection, prior to discharge, because "I always get it when I am like this."  He does not have any signs of bowel obstruction, acute bowel infection or bleeding.  He is stable for discharge.  Vital signs at 1909 are normal.  The patient is noted to have a lactate>4. With the current information available to me, I don't think the patient is in septic shock. The lactate>4, is related to trauma/left lower leg wound.  CRITICAL CARE-no Performed by: Daleen Bo  Nursing Notes Reviewed/ Care Coordinated Applicable Imaging Reviewed Interpretation of Laboratory Data incorporated into ED treatment  The patient appears reasonably screened and/or stabilized for discharge and I doubt any other medical condition or other [facility] requiring further screening, evaluation, or treatment in the ED at this time prior to discharge.  Plan: Home Medications-continue usual; Home Treatments-advance diet as tolerated; return  here if the recommended treatment, does not improve the symptoms; Recommended follow up-PCP or GI, as needed.     Daleen Bo, MD 04/26/19 Glorianne Manchester    Daleen Bo, MD 04/26/19 2026

## 2019-04-26 NOTE — ED Notes (Signed)
MD made aware of Elink recommendations and current labs and advise to continue with planned discharge.

## 2019-04-26 NOTE — ED Provider Notes (Signed)
[facility] DEPT Provider Note   CSN: [id number] Arrival date & time: 04/26/19  1311     History   Chief Complaint No chief complaint on file. cc abd pain  HPI Cap Massi is a 78 y.o. male.  He has a history of Crohn's disease, diabetes, peripheral vascular disease and is status post an amputation of his left lower leg.  He is brought in by EMS for evaluation of abdominal pain that started around 11 AM.  History is limited do due to being lethargic.  He points to his right lower quadrant where the pain is but cannot tell me what type of pain or the intensity of it.  He denies any nausea vomiting or bowel or bladder issues.  Reportedly took some tramadol for pain.     The history is provided by the patient and the EMS personnel.  Abdominal Pain Pain location:  RLQ Pain severity:  Unable to specify Onset quality:  Sudden Duration:  2 hours Timing:  Constant Progression:  Unchanged Relieved by:  Nothing Worsened by:  Nothing Ineffective treatments:  None tried Associated symptoms: no chest pain, no cough, no diarrhea, no dysuria, no fever, no nausea, no shortness of breath, no sore throat and no vomiting     Past Medical History:  Diagnosis Date   Arthritis    Bacteremia due to Escherichia coli 11/2013   Cancer ([place])    basal skin- arm, arm squamous   Crohn disease ([place])    Diabetes mellitus without complication ([place])    DM (diabetes mellitus), type 2 with peripheral vascular complications ([place]) 03/09/4096   DVT (deep venous thrombosis) ([place]) 11/27/13   Dyspnea    at times   History of kidney stones    passed   History of skin cancer    [facility]   Peripheral arterial disease ([place]) 02/15/2019   Pulmonary embolism ([place]) 11/27/13    Shoulder problem    left torn ligament-     Patient Active Problem List   Diagnosis Date Noted   Gangrene of left foot ([place]) 03/01/2019   Sepsis ([place]) 02/15/2019   Anemia 02/15/2019    Cellulitis and abscess of toe of left foot 02/15/2019   Peripheral arterial disease ([place]) 02/15/2019   DM (diabetes mellitus), type 2 with peripheral vascular complications ([place]) 35/32/9924   Gout 02/15/2019   Septic shock ([place]) 12/14/2013   Pulmonary embolism ([place]) 12/14/2013   Crohn disease ([place]) 12/14/2013    Past Surgical History:  Procedure Laterality Date   ABDOMINAL AORTOGRAM N/A 12/14/2018   Procedure: ABDOMINAL AORTOGRAM;  Surgeon: Waynetta Sandy, MD;  Location: [facility];  Service: Cardiovascular;  Laterality: N/A;   ABDOMINAL AORTOGRAM W/LOWER EXTREMITY Bilateral 02/17/2019   Procedure: ABDOMINAL AORTOGRAM W/LOWER EXTREMITY;  Surgeon: [name], MD;  Location: [facility];  Service: Cardiovascular;  Laterality: Bilateral;   ABDOMINAL SURGERY     Bowel resection x2   AMPUTATION Right 12/21/2018   Procedure: RIGHT LONG FINGER AMPUTATION;  Surgeon: Leanora Cover, MD;  Location: [facility];  Service: Orthopedics;  Laterality: Right;   AMPUTATION Left 03/03/2019   Procedure: AMPUTATION BELOW KNEE;  Surgeon: [name], MD;  Location: [facility];  Service: Vascular;  Laterality: Left;   APPLICATION OF WOUND VAC Left 04/09/2019   Procedure: APPLICATION OF WOUND VAC;  Surgeon: [name], MD;  Location: [facility];  Service: Vascular;  Laterality: Left;   CHOLECYSTECTOMY  COLON SURGERY     HIP SURGERY Bilateral    replacement   JOINT REPLACEMENT     LOWER EXTREMITY ANGIOGRAPHY Right 12/14/2018   Procedure: LOWER EXTREMITY ANGIOGRAPHY;  Surgeon: Waynetta Sandy, MD;  Location: [facility];  Service: Cardiovascular;  Laterality: Right;   PERIPHERAL VASCULAR ATHERECTOMY  02/17/2019   Procedure: PERIPHERAL VASCULAR ATHERECTOMY;  Surgeon: [name], MD;  Location: [facility];  Service: Cardiovascular;;   UPPER EXTREMITY ANGIOGRAPHY Right 12/14/2018   Procedure: Right UPPER  EXTREMITY ANGIOGRAPHY;  Surgeon: Waynetta Sandy, MD;  Location: [facility];  Service: Cardiovascular;  Laterality: Right;   WOUND DEBRIDEMENT Left 04/09/2019   Procedure: DEBRIDEMENT WOUND LEFT BELOW KNEE STUMP;  Surgeon: [name], MD;  Location: [facility];  Service: Vascular;  Laterality: Left;        Home Medications    Prior to Admission medications   Medication Sig Start Date End Date Taking? Authorizing Provider  acetaminophen (TYLENOL) 500 MG tablet Take 500 mg by mouth every 6 (six) hours as needed.    [provider]  albuterol (VENTOLIN HFA) 108 (90 Base) MCG/ACT inhaler Inhale 2 puffs into the lungs every 4 (four) hours as needed for wheezing or shortness of breath.  11/20/18   [provider]  allopurinol (ZYLOPRIM) 100 MG tablet Take 100 mg by mouth daily.    [provider]  amLODipine (NORVASC) 2.5 MG tablet Take 2.5 mg by mouth daily with supper.  11/18/18   [provider]  aspirin EC 81 MG tablet Take 81 mg by mouth daily with supper.     [provider]  benzonatate (TESSALON) 100 MG capsule Take 100 mg by mouth 3 (three) times daily as needed for cough.    [provider]  brimonidine-timolol (COMBIGAN) 0.2-0.5 % ophthalmic solution Place 1 drop into both eyes 2 (two) times a day.    [provider]  cephALEXin (KEFLEX) 500 MG capsule Take 1 capsule (500 mg total) by mouth 3 (three) times daily. 04/09/19   Dagoberto Ligas, PA-C  Certolizumab Pegol (CIMZIA) 2 X 200 MG KIT Inject 400 mg into the skin every 28 (twenty-eight) days.    [provider]  Cholecalciferol (VITAMIN D) 50 MCG (2000 UT) tablet Take 2,000 Units by mouth daily.    [provider]  Cyanocobalamin (B-12) 2500 MCG TABS Take 2,500 mcg by mouth daily.    [provider]  Dexamethasone Acetate 8 MG/ML SUSP Inject 8 mg into the muscle daily as needed (for Crohn's flare).    [provider]    Emollient (ZIMS CRACK CREME) CREA Apply 1 application topically daily as needed (dry skin).     [provider]  famciclovir (FAMVIR) 250 MG tablet Take 250 mg by mouth 3 (three) times daily as needed (fungus on corner of mouth flare).  11/18/18   [provider]  ferrous sulfate 325 (65 FE) MG tablet Take 325 mg by mouth daily.    [provider]  latanoprost (XALATAN) 0.005 % ophthalmic solution Place 1 drop into both eyes at bedtime. 11/11/14   [provider]  LORazepam (ATIVAN) 0.5 MG tablet Take 0.5 mg by mouth at bedtime.     [provider]  mesalamine (PENTASA) 500 MG CR capsule Take 2,000 mg by mouth 2 (two) times daily.    [provider]  Multiple Vitamin (MULTIVITAMIN WITH MINERALS) TABS Take 1 tablet by mouth daily.  [provider]  Omega-3 Fatty Acids (FISH OIL) 1000 MG CAPS Take 1,000 mg by mouth daily.    [provider]  OVER THE COUNTER MEDICATION Take 5 mLs by mouth every evening. CBD oil drops     [provider]  oxyCODONE (OXY IR/ROXICODONE) 5 MG immediate release tablet Take 1 tablet (5 mg total) by mouth every 6 (six) hours as needed for severe pain. 04/09/19   Dagoberto Ligas, PA-C  pantoprazole (PROTONIX) 40 MG tablet Take 40 mg by mouth daily at 3 pm. In the afternoon    [provider]  pioglitazone (ACTOS) 15 MG tablet Take 15 mg by mouth daily.  01/13/19   [provider]  pravastatin (PRAVACHOL) 10 MG tablet Take 1 tablet (10 mg total) by mouth every evening. Patient taking differently: Take 10 mg by mouth daily at 3 pm. In the afternoon. 02/20/19 04/05/20  Arrien, Jimmy Picket, MD  predniSONE (DELTASONE) 10 MG tablet Take 20 mg by mouth daily after breakfast. Total daily dose=22.5 mg (take with 0.5 tablet 74m tablet)    [provider]  predniSONE (DELTASONE) 5 MG tablet Take 2.5 mg by mouth daily with breakfast. Total daily dose=22.5 mg (take with two 10 mg  tablets)    [provider]  traMADol (ULTRAM) 50 MG tablet Take 1 tablet (50 mg total) by mouth at bedtime as needed for moderate pain. Patient taking differently: Take 50 mg by mouth every 6 (six) hours as needed for moderate pain.  03/07/19   RGabriel Earing PA-C    Family History Family History  Problem Relation Age of Onset   Hypertension Mother     Social History Social History   Tobacco Use   Smoking status: Former Smoker    Years: 10.00    Types: Pipe   Smokeless tobacco: Former [employer] developer   Quit date: 12/08/1979  Substance Use Topics   Alcohol use: No    Alcohol/week: 0.0 standard drinks   Drug use: No     Allergies   Ciprofloxacin and Shrimp [shellfish allergy]   Review of Systems Review of Systems  Constitutional: Negative for fever.  HENT: Negative for sore throat.   Eyes: Negative for visual disturbance.  Respiratory: Negative for cough and shortness of breath.   Cardiovascular: Negative for chest pain.  Gastrointestinal: Positive for abdominal pain. Negative for diarrhea, nausea and vomiting.  Genitourinary: Negative for dysuria.  Musculoskeletal: Negative for neck pain.  Skin: Negative for rash.  Neurological: Negative for headaches.     Physical Exam Updated Vital Signs BP (!) 143/70 (BP Location: Right Arm)    Pulse 69    Temp 98.1 F (36.7 C) (Oral)    Resp 18    Ht 5' 7" (1.702 m)    SpO2 90%    BMI 24.56 kg/m   Physical Exam Vitals signs and nursing note reviewed.  Constitutional:      Appearance: He is well-developed.  HENT:     Head: Normocephalic and atraumatic.  Eyes:     Conjunctiva/sclera: Conjunctivae normal.  Neck:     Musculoskeletal: Neck supple.  Cardiovascular:     Rate and Rhythm: Normal rate and regular rhythm.     Heart sounds: No murmur.  Pulmonary:     Effort: Tachypnea present. No respiratory distress.     Breath sounds: Normal breath sounds.  Abdominal:     General: There is distension.      Palpations: Abdomen is soft.  Tenderness: There is abdominal tenderness (rlq). There is no guarding or rebound.  Musculoskeletal:     Comments: Patient has a wound VAC on his left lower extremity amputation.  Skin:    General: Skin is warm and dry.     Capillary Refill: Capillary refill takes less than 2 seconds.  Neurological:     Mental Status: He is alert.     Comments: Patient is awake and will answer most questions.  He seems very sleepy and sometimes does not answer.      ED Treatments / Results  Labs (all labs ordered are listed, but only abnormal results are displayed) Labs Reviewed  LACTIC ACID, PLASMA - Abnormal; Notable for the following components:      Result Value   Lactic Acid, Venous 4.2 (*)    All other components within normal limits  LACTIC ACID, PLASMA - Abnormal; Notable for the following components:   Lactic Acid, Venous 3.6 (*)    All other components within normal limits  COMPREHENSIVE METABOLIC PANEL - Abnormal; Notable for the following components:   Glucose, Bld 253 (*)    Albumin 3.4 (*)    All other components within normal limits  CBC WITH DIFFERENTIAL/PLATELET - Abnormal; Notable for the following components:   WBC 19.1 (*)    Hemoglobin 12.3 (*)    MCH 25.9 (*)    MCHC 29.6 (*)    Neutro Abs 17.1 (*)    Abs Immature Granulocytes 0.13 (*)    All other components within normal limits  SARS CORONAVIRUS 2 BY RT PCR (HOSPITAL ORDER, [facility])  CULTURE, BLOOD (ROUTINE X 2)  CULTURE, BLOOD (ROUTINE X 2)  URINE CULTURE  LIPASE, BLOOD  URINALYSIS, ROUTINE W REFLEX MICROSCOPIC  APTT  PROTIME-INR    EKG EKG Interpretation  Date/Time:  Monday April 26 2019 15:36:10 EDT Ventricular Rate:  106 PR Interval:  126 QRS Duration: 78 QT Interval:  340 QTC Calculation: 451 R Axis:   -26 Text Interpretation:  Sinus tachycardia with Premature atrial complexes Minimal voltage criteria for LVH, may be normal variant  Nonspecific ST abnormality Abnormal ECG similar to prior 8/20 Confirmed by Aletta Edouard [phone]) on 04/26/2019 3:41:24 PM   Radiology Ct Abdomen Pelvis W Contrast  Result Date: 04/26/2019 CLINICAL DATA:  Acute generalized abdominal pain. EXAM: CT ABDOMEN AND PELVIS WITH CONTRAST TECHNIQUE: Multidetector CT imaging of the abdomen and pelvis was performed using the standard protocol following bolus administration of intravenous contrast. CONTRAST:  128m OMNIPAQUE IOHEXOL 300 MG/ML  SOLN COMPARISON:  December 08, 2014. FINDINGS: Lower chest: No acute abnormality. Hepatobiliary: No focal liver abnormality is seen. Status post cholecystectomy. No biliary dilatation. Pancreas: Unremarkable. No pancreatic ductal dilatation or surrounding inflammatory changes. Spleen: Normal in size without focal abnormality. Adrenals/Urinary Tract: Adrenal glands appear normal. Stable bilateral renal cysts are noted. No hydronephrosis or renal obstruction is noted. No renal or1 ureteral calculi are noted. Small bladder diverticulum is noted, but otherwise urinary bladder is unremarkable, although partly obscured by scatter artifact arising from bilateral hip prostheses. Stomach/Bowel: The stomach is unremarkable. Status post partial colectomy. There remains probable scarring extending between the proximal duodenum, ileum and colon which is unchanged compared to prior exam. Fistulous connection cannot be excluded. No acute bowel inflammation or dilatation is noted. Vascular/Lymphatic: Aortic atherosclerosis. No enlarged abdominal or pelvic lymph nodes. Reproductive: Not well visualized due to scatter artifact arising from bilateral hip arthroplasties. Other: No abdominal wall hernia or abnormality. No abdominopelvic ascites.  Musculoskeletal: Bilateral hip prosthesis as noted above. No acute abnormality is noted. IMPRESSION: Stable probable scarring is seen extending between the proximal duodenum, ileum and colon. Fistulous connection  cannot be excluded. No definite evidence of acute bowel inflammation or obstruction is noted. Aortic Atherosclerosis (ICD10-I70.0). Electronically Signed   By: Marijo Conception M.D.   On: 04/26/2019 17:59   Dg Chest Port 1 View  Result Date: 04/26/2019 CLINICAL DATA:  Shortness of breath. New onset upper abdominal pain. EXAM: PORTABLE CHEST 1 VIEW COMPARISON:  02/15/2019. Chest CTA dated 07/15/2017. FINDINGS: Stable enlarged cardiac silhouette. Stable elevated right hemidiaphragm with a probable large eventration. Clear lungs with normal vascularity. No pleural fluid. Mild diffuse osteopenia. Thoracic and right shoulder degenerative changes with superior migration of the right humeral head, compatible with a large, chronic rotator cuff tear. IMPRESSION: 1. No acute abnormality. 2. Stable cardiomegaly. Electronically Signed   By: Claudie Revering M.D.   On: 04/26/2019 13:50    Procedures .Critical Care Performed by: Hayden Rasmussen, MD Authorized by: Hayden Rasmussen, MD   Critical care provider statement:    Critical care time (minutes):  45   Critical care time was exclusive of:  Separately billable procedures and treating other patients   Critical care was necessary to treat or prevent imminent or life-threatening deterioration of the following conditions:  Sepsis   Critical care was time spent personally by me on the following activities:  Discussions with consultants, evaluation of patient's response to treatment, examination of patient, ordering and performing treatments and interventions, ordering and review of laboratory studies, ordering and review of radiographic studies, pulse oximetry, re-evaluation of patient's condition, obtaining history from patient or surrogate and review of old charts   I assumed direction of critical care for this patient from another provider in my specialty: no     (including critical care time)  Medications Ordered in ED Medications  sodium chloride (PF)  0.9 % injection (has no administration in time range)  dexamethasone (DECADRON) injection 10 mg (has no administration in time range)  sodium chloride 0.9 % bolus 500 mL (0 mLs Intravenous Stopped 04/26/19 1625)  fentaNYL (SUBLIMAZE) injection 50 mcg (50 mcg Intravenous Given 04/26/19 1414)  sodium chloride 0.9 % bolus 1,000 mL (0 mLs Intravenous Stopped 04/26/19 1632)    And  sodium chloride 0.9 % bolus 250 mL (0 mLs Intravenous Stopped 04/26/19 1632)  ceFEPIme (MAXIPIME) 2 g in sodium chloride 0.9 % 100 mL IVPB (0 g Intravenous Stopped 04/26/19 1632)  metroNIDAZOLE (FLAGYL) IVPB 500 mg (0 mg Intravenous Stopped 04/26/19 1903)  iohexol (OMNIPAQUE) 300 MG/ML solution 100 mL (100 mLs Intravenous Contrast Given 04/26/19 1721)     Initial Impression / Assessment and Plan / ED Course  I have reviewed the triage vital signs and the nursing notes.  Pertinent labs & imaging results that were available during my care of the patient were reviewed by me and considered in my medical decision making (see chart for details).  Clinical Course as of Apr 25 1916  Mon Apr 26, 2019  1347 Patient's wife here now and states he was fine this morning.  He then had shaking chills and acute onset of the right lower quadrant abdominal pain.  She said he is more lethargic.  The visiting nurse for his amputation wound came by and changed his dressing and felt that the wound looked fine.   [MB]  2703 Differential diagnosis includes sepsis, abdominal infection, pneumonia, Covid, [place], renal colic, vascular catastrophe.   [  MB]  1449 Received a call the patient's lactate was elevated at 4.6.  I have activated sepsis pathway and have ordered antibiotics.  Fluids infusing.   [MB]  5701 Patient's labs still have not resulted yet.  I went back and checked on him he is more alert and is got better color.  Michela Pitcher he is time for something to drink but understands that we cannot give him anything to eat or drink until we get some  more test back.  Nurses can get him some oral swabs.   [MB]  7793 Patient's lab still not resulted.  I called down to the lab and they have results down there that have not crossed into epic.  We are attempted to get those results so we can proceed with his work-up.   [MB]  9030 Lab was able to print a copy of the labs.  He is got a white blood cell count is elevated at 19.  Hemoglobin 12.  Normal platelets.  Chemistries normal except for an elevated glucose of 253.  Normal BUN and creatinine.  Normal LFTs.  INR is 1.0.  Urinalysis unremarkable.   [MB]  1837 Normal  SARS Coronavirus 2 by RT PCR (hospital order, performed in [facility] hospital lab) Nasopharyngeal Nasopharyngeal Swab [EW]  1838 Elevated, but improving lactate.  Post IV fluid resuscitation.  Lactic acid, plasma(!!) [EW]  1838 Elevated, but improving heart rate post IV fluid resuscitation.  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  CT Abdomen Pelvis W Contrast [EW]    Clinical Course User Index [EW] Daleen Bo, MD [MB] Hayden Rasmussen, MD   Signed out to Dr Eulis Foster with CT pending. Will need admission for probable abdominal infection, and sirs/sepsis     Final Clinical Impressions(s) / ED Diagnoses   Final diagnoses:  Sepsis, due to unspecified organism, unspecified whether acute organ dysfunction present [facility])  Right lower quadrant abdominal pain    ED Discharge Orders    None       Hayden Rasmussen, MD 04/26/19 1921

## 2019-04-26 NOTE — ED Triage Notes (Signed)
Per EMS: Py c/o of abdominal pain since 11 am.  Pt 90% RA, pt breathing shallow due to pain. Pt placed on 2 L O2 [place]. Pt lethargic. Pt hx of Crohn's.    20 gauge LAC. 140/80 BP 106 HR 97% 2L O2 16 RR 205 CBG

## 2019-04-27 ENCOUNTER — Other Ambulatory Visit: Payer: Self-pay

## 2019-04-27 ENCOUNTER — Encounter (HOSPITAL_COMMUNITY): Payer: Self-pay | Admitting: *Deleted

## 2019-04-27 ENCOUNTER — Inpatient Hospital Stay (HOSPITAL_COMMUNITY)
Admission: EM | Admit: 2019-04-27 | Discharge: 2019-04-29 | DRG: 872 | Disposition: A | Discharge status: Home Health | Payer: Medicare HMO | Attending: Internal Medicine | Admitting: Internal Medicine

## 2019-04-27 ENCOUNTER — Ambulatory Visit (INDEPENDENT_AMBULATORY_CARE_PROVIDER_SITE_OTHER): Payer: Self-pay | Admitting: Vascular Surgery

## 2019-04-27 ENCOUNTER — Telehealth (HOSPITAL_BASED_OUTPATIENT_CLINIC_OR_DEPARTMENT_OTHER): Payer: Self-pay | Admitting: Emergency Medicine

## 2019-04-27 ENCOUNTER — Encounter: Payer: Self-pay | Admitting: Vascular Surgery

## 2019-04-27 VITALS — BP 115/72 | HR 73 | Temp 96.5°F | Resp 18 | Wt 160.0 lb

## 2019-04-27 DIAGNOSIS — Z86718 Personal history of other venous thrombosis and embolism: Secondary | ICD-10-CM

## 2019-04-27 DIAGNOSIS — Z7982 Long term (current) use of aspirin: Secondary | ICD-10-CM | POA: Diagnosis not present

## 2019-04-27 DIAGNOSIS — Z79899 Other long term (current) drug therapy: Secondary | ICD-10-CM

## 2019-04-27 DIAGNOSIS — E11621 Type 2 diabetes mellitus with foot ulcer: Secondary | ICD-10-CM | POA: Diagnosis not present

## 2019-04-27 DIAGNOSIS — Z89512 Acquired absence of left leg below knee: Secondary | ICD-10-CM | POA: Diagnosis not present

## 2019-04-27 DIAGNOSIS — K5090 Crohn's disease, unspecified, without complications: Secondary | ICD-10-CM | POA: Diagnosis present

## 2019-04-27 DIAGNOSIS — I739 Peripheral vascular disease, unspecified: Secondary | ICD-10-CM

## 2019-04-27 DIAGNOSIS — Z8249 Family history of ischemic heart disease and other diseases of the circulatory system: Secondary | ICD-10-CM | POA: Diagnosis not present

## 2019-04-27 DIAGNOSIS — Z79891 Long term (current) use of opiate analgesic: Secondary | ICD-10-CM

## 2019-04-27 DIAGNOSIS — Z87442 Personal history of urinary calculi: Secondary | ICD-10-CM

## 2019-04-27 DIAGNOSIS — E1140 Type 2 diabetes mellitus with diabetic neuropathy, unspecified: Secondary | ICD-10-CM | POA: Diagnosis present

## 2019-04-27 DIAGNOSIS — E872 Acidosis: Secondary | ICD-10-CM | POA: Diagnosis present

## 2019-04-27 DIAGNOSIS — Z89612 Acquired absence of left leg above knee: Secondary | ICD-10-CM | POA: Diagnosis not present

## 2019-04-27 DIAGNOSIS — Z9049 Acquired absence of other specified parts of digestive tract: Secondary | ICD-10-CM | POA: Diagnosis not present

## 2019-04-27 DIAGNOSIS — Z96643 Presence of artificial hip joint, bilateral: Secondary | ICD-10-CM | POA: Diagnosis present

## 2019-04-27 DIAGNOSIS — T8789 Other complications of amputation stump: Secondary | ICD-10-CM

## 2019-04-27 DIAGNOSIS — R7881 Bacteremia: Secondary | ICD-10-CM

## 2019-04-27 DIAGNOSIS — I10 Essential (primary) hypertension: Secondary | ICD-10-CM | POA: Diagnosis present

## 2019-04-27 DIAGNOSIS — B9620 Unspecified Escherichia coli [E. coli] as the cause of diseases classified elsewhere: Secondary | ICD-10-CM | POA: Diagnosis not present

## 2019-04-27 DIAGNOSIS — Z20828 Contact with and (suspected) exposure to other viral communicable diseases: Secondary | ICD-10-CM | POA: Diagnosis present

## 2019-04-27 DIAGNOSIS — Z978 Presence of other specified devices: Secondary | ICD-10-CM

## 2019-04-27 DIAGNOSIS — A4151 Sepsis due to Escherichia coli [E. coli]: Secondary | ICD-10-CM | POA: Diagnosis present

## 2019-04-27 DIAGNOSIS — L97519 Non-pressure chronic ulcer of other part of right foot with unspecified severity: Secondary | ICD-10-CM

## 2019-04-27 DIAGNOSIS — A419 Sepsis, unspecified organism: Secondary | ICD-10-CM | POA: Diagnosis present

## 2019-04-27 DIAGNOSIS — R69 Illness, unspecified: Secondary | ICD-10-CM

## 2019-04-27 DIAGNOSIS — E1151 Type 2 diabetes mellitus with diabetic peripheral angiopathy without gangrene: Secondary | ICD-10-CM | POA: Diagnosis present

## 2019-04-27 DIAGNOSIS — Z86711 Personal history of pulmonary embolism: Secondary | ICD-10-CM | POA: Diagnosis not present

## 2019-04-27 DIAGNOSIS — K50913 Crohn's disease, unspecified, with fistula: Secondary | ICD-10-CM | POA: Diagnosis not present

## 2019-04-27 DIAGNOSIS — Z85828 Personal history of other malignant neoplasm of skin: Secondary | ICD-10-CM

## 2019-04-27 DIAGNOSIS — Z794 Long term (current) use of insulin: Secondary | ICD-10-CM

## 2019-04-27 DIAGNOSIS — Z87891 Personal history of nicotine dependence: Secondary | ICD-10-CM | POA: Diagnosis not present

## 2019-04-27 DIAGNOSIS — B9689 Other specified bacterial agents as the cause of diseases classified elsewhere: Secondary | ICD-10-CM | POA: Diagnosis not present

## 2019-04-27 LAB — CBC WITH DIFFERENTIAL/PLATELET
Abs Immature Granulocytes: 0.12 10*3/uL — ABNORMAL HIGH (ref 0.00–0.07)
Basophils Absolute: 0 10*3/uL (ref 0.0–0.1)
Basophils Relative: 0 %
Eosinophils Absolute: 0 10*3/uL (ref 0.0–0.5)
Eosinophils Relative: 0 %
HCT: 35.6 % — ABNORMAL LOW (ref 39.0–52.0)
Hemoglobin: 10.4 g/dL — ABNORMAL LOW (ref 13.0–17.0)
Immature Granulocytes: 1 %
Lymphocytes Relative: 14 %
Lymphs Abs: 2.5 10*3/uL (ref 0.7–4.0)
MCH: 25.9 pg — ABNORMAL LOW (ref 26.0–34.0)
MCHC: 29.2 g/dL — ABNORMAL LOW (ref 30.0–36.0)
MCV: 88.6 fL (ref 80.0–100.0)
Monocytes Absolute: 1.3 10*3/uL — ABNORMAL HIGH (ref 0.1–1.0)
Monocytes Relative: 8 %
Neutro Abs: 13.9 10*3/uL — ABNORMAL HIGH (ref 1.7–7.7)
Neutrophils Relative %: 77 %
Platelets: 298 10*3/uL (ref 150–400)
RBC: 4.02 MIL/uL — ABNORMAL LOW (ref 4.22–5.81)
RDW: 14.8 % (ref 11.5–15.5)
WBC: 17.8 10*3/uL — ABNORMAL HIGH (ref 4.0–10.5)
nRBC: 0 % (ref 0.0–0.2)

## 2019-04-27 LAB — BASIC METABOLIC PANEL
Anion gap: 11 (ref 5–15)
BUN: 21 mg/dL (ref 8–23)
CO2: 21 mmol/L — ABNORMAL LOW (ref 22–32)
Calcium: 8.9 mg/dL (ref 8.9–10.3)
Chloride: 108 mmol/L (ref 98–111)
Creatinine, Ser: 0.9 mg/dL (ref 0.61–1.24)
GFR calc Af Amer: >60 mL/min (ref >60)
GFR calc non Af Amer: >60 mL/min (ref >60)
Glucose, Bld: 253 mg/dL — ABNORMAL HIGH (ref 70–99)
Potassium: 3.7 mmol/L (ref 3.5–5.1)
Sodium: 140 mmol/L (ref 135–145)

## 2019-04-27 LAB — BLOOD CULTURE ID PANEL (REFLEXED)

## 2019-04-27 LAB — CBC
HCT: 32.7 % — ABNORMAL LOW (ref 39.0–52.0)
Hemoglobin: 9.6 g/dL — ABNORMAL LOW (ref 13.0–17.0)
MCH: 26.2 pg (ref 26.0–34.0)
MCHC: 29.4 g/dL — ABNORMAL LOW (ref 30.0–36.0)
MCV: 89.3 fL (ref 80.0–100.0)
Platelets: 262 10*3/uL (ref 150–400)
RBC: 3.66 MIL/uL — ABNORMAL LOW (ref 4.22–5.81)
RDW: 14.9 % (ref 11.5–15.5)
WBC: 14.4 10*3/uL — ABNORMAL HIGH (ref 4.0–10.5)
nRBC: 0 % (ref 0.0–0.2)

## 2019-04-27 LAB — URINALYSIS, ROUTINE W REFLEX MICROSCOPIC
Bilirubin Urine: NEGATIVE
Glucose, UA: 50 mg/dL — AB
Hgb urine dipstick: NEGATIVE
Ketones, ur: NEGATIVE mg/dL
Leukocytes,Ua: NEGATIVE
Nitrite: NEGATIVE
Protein, ur: NEGATIVE mg/dL
Specific Gravity, Urine: 1.032 — ABNORMAL HIGH (ref 1.005–1.030)
pH: 5 (ref 5.0–8.0)

## 2019-04-27 LAB — GLUCOSE, CAPILLARY: Glucose-Capillary: 288 mg/dL — ABNORMAL HIGH (ref 70–99)

## 2019-04-27 LAB — LACTIC ACID, PLASMA
Lactic Acid, Venous: 5.1 mmol/L (ref 0.5–1.9)
Lactic Acid, Venous: 3.5 mmol/L (ref 0.5–1.9)
Lactic Acid, Venous: 3.8 mmol/L (ref 0.5–1.9)
Lactic Acid, Venous: 2.9 mmol/L (ref 0.5–1.9)

## 2019-04-27 LAB — CREATININE, SERUM
Creatinine, Ser: 0.66 mg/dL (ref 0.61–1.24)
GFR calc Af Amer: >60 mL/min (ref >60)
GFR calc non Af Amer: >60 mL/min (ref >60)

## 2019-04-27 MED ORDER — ADULT MULTIVITAMIN W/MINERALS CH
1.0000 | ORAL_TABLET | Freq: Every day | ORAL | Status: DC
  Administered 2019-04-28 – 2019-04-29 (×2): 1 via ORAL
  Filled 2019-04-27 (×2): qty 1

## 2019-04-27 MED ORDER — PRAVASTATIN SODIUM 20 MG PO TABS
10.0000 mg | ORAL_TABLET | Freq: Every day | ORAL | Status: DC
  Administered 2019-04-28: 10 mg via ORAL
  Filled 2019-04-27: qty 1
  Filled 2019-04-27: qty 0.5

## 2019-04-27 MED ORDER — ENOXAPARIN SODIUM 40 MG/0.4ML ~~LOC~~ SOLN
40.0000 mg | SUBCUTANEOUS | Status: DC
  Administered 2019-04-27 – 2019-04-28 (×2): 40 mg via SUBCUTANEOUS
  Filled 2019-04-27 (×2): qty 0.4

## 2019-04-27 MED ORDER — SODIUM CHLORIDE 0.9 % IV SOLN
2.0000 g | INTRAVENOUS | Status: DC
  Administered 2019-04-28 – 2019-04-29 (×2): 2 g via INTRAVENOUS
  Filled 2019-04-27 (×2): qty 20

## 2019-04-27 MED ORDER — ACETAMINOPHEN 325 MG PO TABS
650.0000 mg | ORAL_TABLET | Freq: Four times a day (QID) | ORAL | Status: DC | PRN
  Administered 2019-04-27 – 2019-04-29 (×4): 650 mg via ORAL
  Filled 2019-04-27 (×5): qty 2

## 2019-04-27 MED ORDER — OXYCODONE HCL 5 MG PO TABS: 5.0000 mg | ORAL_TABLET | Freq: Four times a day (QID) | ORAL | Status: DC | PRN

## 2019-04-27 MED ORDER — ACETAMINOPHEN 650 MG RE SUPP: 650.0000 mg | Freq: Four times a day (QID) | RECTAL | Status: DC | PRN

## 2019-04-27 MED ORDER — INSULIN ASPART 100 UNIT/ML ~~LOC~~ SOLN
0.0000 [IU] | Freq: Three times a day (TID) | SUBCUTANEOUS | Status: DC
  Administered 2019-04-28: 3 [IU] via SUBCUTANEOUS
  Filled 2019-04-27: qty 0.15

## 2019-04-27 MED ORDER — SODIUM CHLORIDE 0.9 % IV SOLN: 2.0000 g | INTRAVENOUS | Status: DC

## 2019-04-27 MED ORDER — INSULIN ASPART 100 UNIT/ML ~~LOC~~ SOLN
3.0000 [IU] | Freq: Three times a day (TID) | SUBCUTANEOUS | Status: DC
  Administered 2019-04-28 – 2019-04-29 (×5): 3 [IU] via SUBCUTANEOUS
  Filled 2019-04-27: qty 0.03

## 2019-04-27 MED ORDER — SODIUM CHLORIDE 0.9 % IV SOLN
INTRAVENOUS | Status: AC
  Administered 2019-04-27 – 2019-04-28 (×2): via INTRAVENOUS

## 2019-04-27 MED ORDER — SODIUM CHLORIDE 0.9 % IV SOLN: 1.0000 g | Freq: Once | INTRAVENOUS | Status: DC

## 2019-04-27 MED ORDER — FERROUS SULFATE 325 (65 FE) MG PO TABS
325.0000 mg | ORAL_TABLET | Freq: Every day | ORAL | Status: DC
  Administered 2019-04-28 – 2019-04-29 (×2): 325 mg via ORAL
  Filled 2019-04-27 (×2): qty 1

## 2019-04-27 MED ORDER — SENNOSIDES-DOCUSATE SODIUM 8.6-50 MG PO TABS: 2.0000 | ORAL_TABLET | Freq: Every evening | ORAL | Status: DC | PRN

## 2019-04-27 MED ORDER — SODIUM CHLORIDE 0.9 % IV BOLUS
1000.0000 mL | Freq: Once | INTRAVENOUS | Status: AC
  Administered 2019-04-27: 1000 mL via INTRAVENOUS

## 2019-04-27 MED ORDER — VITAMIN D3 25 MCG (1000 UNIT) PO TABS
2000.0000 [IU] | ORAL_TABLET | Freq: Every day | ORAL | Status: DC
  Administered 2019-04-28 – 2019-04-29 (×2): 2000 [IU] via ORAL
  Filled 2019-04-27 (×2): qty 2

## 2019-04-27 MED ORDER — SODIUM CHLORIDE 0.9 % IV SOLN
Freq: Once | INTRAVENOUS | Status: AC
  Administered 2019-04-27: 14:00:00 via INTRAVENOUS

## 2019-04-27 MED ORDER — PREDNISONE 20 MG PO TABS
20.0000 mg | ORAL_TABLET | Freq: Every day | ORAL | Status: DC
  Administered 2019-04-28 – 2019-04-29 (×2): 20 mg via ORAL
  Filled 2019-04-27 (×2): qty 1

## 2019-04-27 MED ORDER — HYDRALAZINE HCL 20 MG/ML IJ SOLN: 10.0000 mg | INTRAMUSCULAR | Status: DC | PRN

## 2019-04-27 MED ORDER — INSULIN ASPART 100 UNIT/ML ~~LOC~~ SOLN
0.0000 [IU] | Freq: Every day | SUBCUTANEOUS | Status: DC
  Administered 2019-04-27: 3 [IU] via SUBCUTANEOUS
  Filled 2019-04-27: qty 0.05

## 2019-04-27 MED ORDER — SODIUM CHLORIDE 0.9 % IV BOLUS
500.0000 mL | Freq: Once | INTRAVENOUS | Status: AC
  Administered 2019-04-27: 500 mL via INTRAVENOUS

## 2019-04-27 MED ORDER — SODIUM CHLORIDE 0.9 % IV SOLN
2.0000 g | Freq: Once | INTRAVENOUS | Status: AC
  Administered 2019-04-27: 2 g via INTRAVENOUS
  Filled 2019-04-27: qty 20

## 2019-04-27 MED ORDER — PANTOPRAZOLE SODIUM 40 MG PO TBEC
40.0000 mg | DELAYED_RELEASE_TABLET | Freq: Every day | ORAL | Status: DC
  Administered 2019-04-28: 40 mg via ORAL
  Filled 2019-04-27: qty 1

## 2019-04-27 MED ORDER — VITAMIN B-12 1000 MCG PO TABS
2500.0000 ug | ORAL_TABLET | Freq: Every day | ORAL | Status: DC
  Administered 2019-04-28 – 2019-04-29 (×2): 2500 ug via ORAL
  Filled 2019-04-27 (×2): qty 3

## 2019-04-27 MED ORDER — POLYETHYLENE GLYCOL 3350 17 G PO PACK: 17.0000 g | PACK | Freq: Every day | ORAL | Status: DC | PRN

## 2019-04-27 MED ORDER — ALBUTEROL SULFATE (2.5 MG/3ML) 0.083% IN NEBU: 2.5000 mg | INHALATION_SOLUTION | RESPIRATORY_TRACT | Status: DC | PRN

## 2019-04-27 MED ORDER — B-12 2500 MCG PO TABS: 2500.0000 ug | ORAL_TABLET | Freq: Every day | ORAL | Status: DC

## 2019-04-27 MED ORDER — TRAMADOL HCL 50 MG PO TABS
50.0000 mg | ORAL_TABLET | Freq: Four times a day (QID) | ORAL | Status: DC | PRN
  Administered 2019-04-27 – 2019-04-29 (×4): 50 mg via ORAL
  Filled 2019-04-27 (×5): qty 1

## 2019-04-27 MED ORDER — SODIUM CHLORIDE 0.9 % IV SOLN
1.0000 g | Freq: Three times a day (TID) | INTRAVENOUS | Status: DC
  Filled 2019-04-27 (×2): qty 1

## 2019-04-27 MED ORDER — ALLOPURINOL 100 MG PO TABS
100.0000 mg | ORAL_TABLET | Freq: Every day | ORAL | Status: DC
  Administered 2019-04-28 – 2019-04-29 (×2): 100 mg via ORAL
  Filled 2019-04-27 (×2): qty 1

## 2019-04-27 MED ORDER — ASPIRIN EC 81 MG PO TBEC
81.0000 mg | DELAYED_RELEASE_TABLET | Freq: Every day | ORAL | Status: DC
  Administered 2019-04-28: 81 mg via ORAL
  Filled 2019-04-27 (×2): qty 1

## 2019-04-27 NOTE — H&P (Addendum)
History and Physical    Edward Guthmiller IPJ:[id number] DOB: 06-14-1941 DOA: 04/27/2019  PCP: Egbert Garibaldi, PA-C Patient coming from: Home  Chief Complaint: Positive blood cultures  HPI: Rodney Sullivan is a 78 y.o. male with medical history significant of with history of Crohn's disease, history of PE/DVT, diabetes mellitus type 2, essential hypertension, peripheral vascular disease was asked to come back to the ER for evaluation of positive blood cultures.  Initially patient presented to the hospital yesterday with generalized weakness, confusion and abdominal discomfort.  Work-up showed elevated WBC and lactic acidosis.  CT of the abdomen pelvis was negative, he was given 1 dose of IV cefepime and discharged home.  Blood cultures were drawn prior to discharge.  Today he received a call that his blood cultures are positive therefore advised to come back to the ER. He was seen by vascular this morning secondary to recent left foot amputation and was told it is healing well and recommended routine wound VAC changes.  In the ER today patient was noted to have leukocytosis and lactic acidosis.  Blood cultures were positive for E. coli and Enterobacter.  He was given dose of cefepime.  Seen by infectious disease.   Review of Systems: As per HPI otherwise 10 point review of systems negative.  Review of Systems Otherwise negative except as per HPI, including: General: Denies fever, chills, night sweats or unintended weight loss. Resp: Denies cough, wheezing, shortness of breath. Cardiac: Denies chest pain, palpitations, orthopnea, paroxysmal nocturnal dyspnea. GI: Denies abdominal pain, nausea, vomiting, diarrhea or constipation GU: Denies dysuria, frequency, hesitancy or incontinence MS: Denies muscle aches, joint pain or swelling Neuro: Denies headache, neurologic deficits (focal weakness, numbness, tingling), abnormal gait Psych: Denies anxiety, depression, SI/HI/AVH Skin: Denies new rashes or  lesions ID: Denies sick contacts, exotic exposures, travel  Past Medical History:  Diagnosis Date  . Arthritis   . Bacteremia due to Escherichia coli 11/2013  . Cancer (HCC)    basal skin- arm, arm squamous  . Crohn disease ([place])   . Diabetes mellitus without complication ([place])   . DM (diabetes mellitus), type 2 with peripheral vascular complications ([place]) 7/34/1937  . DVT (deep venous thrombosis) ([place]) 11/27/13  . Dyspnea    at times  . History of kidney stones    passed  . History of skin cancer    ARMS & FACE  . Peripheral arterial disease ([place]) 02/15/2019  . Pulmonary embolism ([employer]) 11/27/13   . Shoulder problem    left torn ligament-     Past Surgical History:  Procedure Laterality Date  . ABDOMINAL AORTOGRAM N/A 12/14/2018   Procedure: ABDOMINAL AORTOGRAM;  Surgeon: Waynetta Sandy, MD;  Location: [facility];  Service: Cardiovascular;  Laterality: N/A;  . ABDOMINAL AORTOGRAM W/LOWER EXTREMITY Bilateral 02/17/2019   Procedure: ABDOMINAL AORTOGRAM W/LOWER EXTREMITY;  Surgeon: [name], MD;  Location: [facility];  Service: Cardiovascular;  Laterality: Bilateral;  . ABDOMINAL SURGERY     Bowel resection x2  . AMPUTATION Right 12/21/2018   Procedure: RIGHT LONG FINGER AMPUTATION;  Surgeon: Leanora Cover, MD;  Location: [facility];  Service: Orthopedics;  Laterality: Right;  . AMPUTATION Left 03/03/2019   Procedure: AMPUTATION BELOW KNEE;  Surgeon: [name], MD;  Location: [facility];  Service: Vascular;  Laterality: Left;  . APPLICATION OF WOUND VAC Left 04/09/2019   Procedure: APPLICATION OF WOUND VAC;  Surgeon: [name], MD;  Location: [facility];  Service: Vascular;  Laterality: Left;  . CHOLECYSTECTOMY    . COLON SURGERY    . HIP SURGERY Bilateral    replacement  . JOINT REPLACEMENT    . LOWER EXTREMITY ANGIOGRAPHY Right 12/14/2018   Procedure: LOWER EXTREMITY ANGIOGRAPHY;  Surgeon: Waynetta Sandy, MD;   Location: [facility];  Service: Cardiovascular;  Laterality: Right;  . PERIPHERAL VASCULAR ATHERECTOMY  02/17/2019   Procedure: PERIPHERAL VASCULAR ATHERECTOMY;  Surgeon: [name], MD;  Location: [facility];  Service: Cardiovascular;;  . UPPER EXTREMITY ANGIOGRAPHY Right 12/14/2018   Procedure: Right UPPER EXTREMITY ANGIOGRAPHY;  Surgeon: Waynetta Sandy, MD;  Location: [facility];  Service: Cardiovascular;  Laterality: Right;  . WOUND DEBRIDEMENT Left 04/09/2019   Procedure: DEBRIDEMENT WOUND LEFT BELOW KNEE STUMP;  Surgeon: [name], MD;  Location: [facility];  Service: Vascular;  Laterality: Left;    SOCIAL HISTORY:  reports that he has quit smoking. His smoking use included pipe. He quit after 10.00 years of use. He quit smokeless tobacco use about 39 years ago. He reports that he does not drink alcohol or use drugs.  Allergies  Allergen Reactions  . Ciprofloxacin Other (See Comments)    tendons hurt  . Shrimp [Shellfish Allergy]     FAMILY HISTORY: Family History  Problem Relation Age of Onset  . Hypertension Mother      Prior to Admission medications   Medication Sig Start Date End Date Taking? Authorizing Provider  acetaminophen (TYLENOL) 500 MG tablet Take 500 mg by mouth every 6 (six) hours as needed.   Yes [provider]  albuterol (VENTOLIN HFA) 108 (90 Base) MCG/ACT inhaler Inhale 2 puffs into the lungs every 4 (four) hours as needed for wheezing or shortness of breath.  11/20/18  Yes [provider]  allopurinol (ZYLOPRIM) 100 MG tablet Take 100 mg by mouth daily.   Yes [provider]  amLODipine (NORVASC) 2.5 MG tablet Take 2.5 mg by mouth daily with supper.  11/18/18  Yes [provider]  aspirin EC 81 MG tablet Take 81 mg by mouth daily with supper.    Yes [provider]  benzonatate (TESSALON) 100 MG capsule Take 100 mg by mouth 3 (three) times daily as needed for cough.    Yes [provider]  brimonidine-timolol (COMBIGAN) 0.2-0.5 % ophthalmic solution Place 1 drop into both eyes 2 (two) times a day.   Yes [provider]  Certolizumab Pegol (CIMZIA) 2 X 200 MG KIT Inject 400 mg into the skin every 28 (twenty-eight) days.   Yes [provider]  Cholecalciferol (VITAMIN D) 50 MCG (2000 UT) tablet Take 2,000 Units by mouth daily.   Yes [provider]  Cyanocobalamin (B-12) 2500 MCG TABS Take 2,500 mcg by mouth daily.   Yes [provider]  Dexamethasone Acetate 8 MG/ML SUSP Inject 8 mg into the muscle daily as needed (for Crohn's flare).   Yes [provider]  Emollient (ZIMS CRACK CREME) CREA Apply 1 application topically daily as needed (dry skin).    Yes [provider]  famciclovir (FAMVIR) 250 MG tablet Take 250 mg by mouth 3 (three) times daily as needed (fungus on corner of mouth flare).  11/18/18  Yes [provider]  ferrous sulfate 325 (65 FE) MG tablet Take 325 mg by mouth daily.   Yes [provider]  latanoprost (XALATAN) 0.005 % ophthalmic solution Place 1 drop into both eyes at bedtime. 11/11/14  Yes [provider]  Multiple Vitamin (MULTIVITAMIN WITH MINERALS) TABS Take 1 tablet by mouth daily.   Yes [provider]  Omega-3 Fatty Acids (FISH OIL) 1000 MG CAPS Take 1,000 mg by mouth daily.   Yes [provider]  OVER THE COUNTER MEDICATION Take 5 mLs by mouth every evening. CBD oil drops    Yes [provider]  pantoprazole (PROTONIX) 40 MG tablet Take 40 mg by mouth daily at 3 pm. In the afternoon   Yes [provider]  pioglitazone (ACTOS) 15 MG tablet Take 15 mg by mouth daily.  01/13/19  Yes [provider]  pravastatin (PRAVACHOL) 10 MG tablet Take 1 tablet (10 mg total) by mouth every evening. Patient taking differently: Take 10 mg by mouth daily at 3 pm. In the afternoon. 02/20/19 04/05/20 Yes Arrien, Jimmy Picket,  MD  predniSONE (DELTASONE) 10 MG tablet Take 20 mg by mouth daily after breakfast. Total daily dose=22.5 mg (take with 0.5 tablet 76m tablet)   Yes [provider]  traMADol (ULTRAM) 50 MG tablet Take 1 tablet (50 mg total) by mouth at bedtime as needed for moderate pain. Patient taking differently: Take 50 mg by mouth every 6 (six) hours as needed for moderate pain.  03/07/19  Yes Rhyne, Samantha J, PA-C  cephALEXin (KEFLEX) 500 MG capsule Take 1 capsule (500 mg total) by mouth 3 (three) times daily. Patient not taking: Reported on 04/27/2019 04/09/19   EDagoberto Ligas PA-C  oxyCODONE (OXY IR/ROXICODONE) 5 MG immediate release tablet Take 1 tablet (5 mg total) by mouth every 6 (six) hours as needed for severe pain. Patient not taking: Reported on 04/27/2019 04/09/19   EDagoberto Ligas PA-C    Physical Exam: Vitals:   04/27/19 1144 04/27/19 1402 04/27/19 1432 04/27/19 1500  BP:  116/61 (!) 95/55 105/63  Pulse:  71 77 72  Resp:  16 20 16   Temp:      TempSrc:      SpO2:  95% 97% 98%  Height: 5' 7"  (1.702 m)         Constitutional: NAD, calm, comfortable Eyes: PERRL, lids and conjunctivae normal ENMT: Mucous membranes are moist. Posterior pharynx clear of any exudate or lesions.Normal dentition.  Neck: normal, supple, no masses, no thyromegaly Respiratory: clear to auscultation bilaterally, no wheezing, no crackles. Normal respiratory effort. No accessory muscle use.  Cardiovascular: Regular rate and rhythm, no murmurs / rubs / gallops. No extremity edema. 2+ pedal pulses. No carotid bruits.  Abdomen: no tenderness, no masses palpated. No hepatosplenomegaly. Bowel sounds positive.  Musculoskeletal: Left-sided BKA with dressing in place Skin: no rashes, lesions, ulcers. No induration Neurologic: CN 2-12 grossly intact. Sensation intact, DTR normal. Strength 5/5 in all 4.  Psychiatric: Normal judgment and insight. Alert and oriented x 3. Normal mood.     Labs on Admission: I  have personally reviewed following labs and imaging studies  CBC: Recent Labs  Lab 04/26/19 1437 04/27/19 1412  WBC 19.1* 17.8*  NEUTROABS 17.1* 13.9*  HGB 12.3* 10.4*  HCT 41.5 35.6*  MCV 87.6 88.6  PLT 389 2081  Basic Metabolic Panel: Recent Labs  Lab 04/26/19 1437 04/27/19 1412  NA 139 140  K 4.0 3.7  CL 103 108  CO2 22 21*  GLUCOSE 253* 253*  BUN 17 21  CREATININE 0.97 0.90  CALCIUM 9.2 8.9   GFR: Estimated Creatinine Clearance: 65.3 mL/min (by C-G formula based on SCr of 0.9 mg/dL). Liver Function Tests: Recent Labs  Lab  04/26/19 1437  AST 32  ALT 23  ALKPHOS 77  BILITOT 0.4  PROT 6.5  ALBUMIN 3.4*   Recent Labs  Lab 04/26/19 1437  LIPASE 37   No results for input(s): AMMONIA in the last 168 hours. Coagulation Profile: Recent Labs  Lab 04/26/19 1505  INR 1.0   Cardiac Enzymes: No results for input(s): CKTOTAL, CKMB, CKMBINDEX, TROPONINI in the last 168 hours. BNP (last 3 results) No results for input(s): PROBNP in the last 8760 hours. HbA1C: No results for input(s): HGBA1C in the last 72 hours. CBG: No results for input(s): GLUCAP in the last 168 hours. Lipid Profile: No results for input(s): CHOL, HDL, LDLCALC, TRIG, CHOLHDL, LDLDIRECT in the last 72 hours. Thyroid Function Tests: No results for input(s): TSH, T4TOTAL, FREET4, T3FREE, THYROIDAB in the last 72 hours. Anemia Panel: No results for input(s): VITAMINB12, FOLATE, FERRITIN, TIBC, IRON, RETICCTPCT in the last 72 hours. Urine analysis:    Component Value Date/Time   COLORURINE YELLOW 04/27/2019 1412   APPEARANCEUR HAZY (A) 04/27/2019 1412   LABSPEC 1.032 (H) 04/27/2019 1412   PHURINE 5.0 04/27/2019 1412   GLUCOSEU 50 (A) 04/27/2019 1412   HGBUR NEGATIVE 04/27/2019 1412   [place] 04/27/2019 1412   KETONESUR NEGATIVE 04/27/2019 1412   PROTEINUR NEGATIVE 04/27/2019 1412   UROBILINOGEN 0.2 12/08/2014 1356   NITRITE NEGATIVE 04/27/2019 1412   LEUKOCYTESUR NEGATIVE  04/27/2019 1412   Sepsis Labs: !!!!!!!!!!!!!!!!!!!!!!!!!!!!!!!!!!!!!!!!!!!! @LABRCNTIP (procalcitonin:4,lacticidven:4) ) Recent Results (from the past 240 hour(s))  Culture, blood (routine x 2)     Status: None (Preliminary result)   Collection Time: 04/26/19  3:05 PM   Specimen: BLOOD RIGHT HAND  Result Value Ref Range Status   Specimen Description   Final    BLOOD RIGHT HAND Performed at [facility], [place] [address] [place], [place] [address]    Special Requests   Final    BOTTLES DRAWN AEROBIC AND ANAEROBIC Blood Culture adequate volume Performed at [facility] [address], [place], [place] [address]    Culture  Setup Time   Final    GRAM NEGATIVE RODS ANAEROBIC BOTTLE ONLY CRITICAL RESULT CALLED TO, READ BACK BY AND VERIFIED WITH: C. REED, RN (MCHP) AT [phone] ON 04/27/19 BY C. JESSUP, MT.    Culture   Final    CULTURE REINCUBATED FOR BETTER GROWTH Performed at [facility], [place] [address], [place], [place] [address]    Report Status PENDING  Incomplete  Culture, blood (routine x 2)     Status: None (Preliminary result)   Collection Time: 04/26/19  3:05 PM   Specimen: BLOOD  Result Value Ref Range Status   Specimen Description   Final    BLOOD RIGHT ANTECUBITAL Performed at [facility] [address] [place], [place] [address]    Special Requests   Final    BOTTLES DRAWN AEROBIC AND ANAEROBIC Blood Culture adequate volume Performed at [facility] [address], [place], [place] [address]    Culture   Final    NO GROWTH < 24 HOURS Performed at [facility] [address] [place], [place] [address]    Report Status PENDING  Incomplete  Urine culture     Status: Abnormal (Preliminary result)   Collection Time: 04/26/19  3:05 PM   Specimen: In/Out Cath Urine  Result Value Ref Range Status   Specimen Description   Final    IN/OUT CATH URINE Performed at [facility]  [place] [address], [place], [place] [address]    Special Requests   Final    NONE Performed at [facility], [place] [address], [place], [place] [address]    Culture (A)  Final    1,000 COLONIES/mL ENTEROCOCCUS FAECALIS SUSCEPTIBILITIES TO FOLLOW Performed at [facility], [place] [address] [place], [place] [address]    Report Status PENDING  Incomplete  Blood Culture ID Panel (Reflexed)     Status: Abnormal   Collection Time: 04/26/19  3:05 PM  Result Value Ref Range Status   Enterococcus species NOT DETECTED NOT DETECTED Final   Listeria monocytogenes NOT DETECTED NOT DETECTED Final   Staphylococcus species NOT DETECTED NOT DETECTED Final   Staphylococcus aureus (BCID) NOT DETECTED NOT DETECTED Final   Streptococcus species NOT DETECTED NOT DETECTED Final   Streptococcus agalactiae NOT DETECTED NOT DETECTED Final   Streptococcus pneumoniae NOT DETECTED NOT DETECTED Final   Streptococcus pyogenes NOT DETECTED NOT DETECTED Final   Acinetobacter baumannii NOT DETECTED NOT DETECTED Final   Enterobacteriaceae species DETECTED (A) NOT DETECTED Final    Comment: Enterobacteriaceae represent a large family of gram-negative bacteria, not a single organism. CRITICAL RESULT CALLED TO, READ BACK BY AND VERIFIED WITH: C. REED, RN (MCHP) AT 0102 ON 04/27/19 BY C. JESSUP, MT.    Enterobacter cloacae complex NOT DETECTED NOT DETECTED Final   Escherichia coli DETECTED (A) NOT DETECTED Final    Comment: CRITICAL RESULT CALLED TO, READ BACK BY AND VERIFIED WITH: C. REED, RN (MCHP) AT 7253 ON 04/27/19 BY C. JESSUP, MT.    Klebsiella oxytoca NOT DETECTED NOT DETECTED Final   Klebsiella pneumoniae NOT DETECTED NOT DETECTED Final   Proteus species NOT DETECTED NOT DETECTED Final   Serratia marcescens NOT DETECTED NOT DETECTED Final   Carbapenem resistance NOT DETECTED NOT DETECTED Final   Haemophilus influenzae NOT DETECTED NOT DETECTED Final   Neisseria  meningitidis NOT DETECTED NOT DETECTED Final   Pseudomonas aeruginosa NOT DETECTED NOT DETECTED Final   Candida albicans NOT DETECTED NOT DETECTED Final   Candida glabrata NOT DETECTED NOT DETECTED Final   Candida krusei NOT DETECTED NOT DETECTED Final   Candida parapsilosis NOT DETECTED NOT DETECTED Final   Candida tropicalis NOT DETECTED NOT DETECTED Final    Comment: Performed at [facility], [place] [address] [place], [place] [address]  SARS Coronavirus 2 by RT PCR (hospital order, performed in [facility] hospital lab) Nasopharyngeal Nasopharyngeal Swab     Status: None   Collection Time: 04/26/19  5:20 PM   Specimen: Nasopharyngeal Swab  Result Value Ref Range Status   SARS Coronavirus 2 NEGATIVE NEGATIVE Final    Comment: (NOTE) If result is NEGATIVE SARS-CoV-2 target nucleic acids are NOT DETECTED. The SARS-CoV-2 RNA is generally detectable in upper and lower  respiratory specimens during the acute phase of infection. The lowest  concentration of SARS-CoV-2 viral copies this assay can detect is 250  copies / mL. A negative result does not preclude SARS-CoV-2 infection  and should not be used as the sole basis for treatment or other  patient management decisions.  A negative result may occur with  improper specimen collection / handling, submission of specimen other  than nasopharyngeal swab, presence of viral mutation(s) within the  areas targeted by this assay, and inadequate number of viral copies  (<250 copies / mL). A negative result must be combined with clinical  observations, patient history, and epidemiological information.  If result is POSITIVE SARS-CoV-2 target nucleic acids are DETECTED. The SARS-CoV-2 RNA is generally detectable in upper and lower  respiratory specimens dur ing the acute phase of infection.  Positive  results are indicative of active infection with SARS-CoV-2.  Clinical  correlation with patient history and other diagnostic information is   necessary to determine patient infection status.  Positive results do  not rule out bacterial infection or co-infection with other viruses. If result is PRESUMPTIVE POSTIVE SARS-CoV-2 nucleic acids MAY BE PRESENT.   A presumptive positive result was obtained on the submitted specimen  and confirmed on repeat testing.  While 2019 novel coronavirus  (SARS-CoV-2) nucleic acids may be present in the submitted sample  additional confirmatory testing may be necessary for epidemiological  and / or clinical management purposes  to differentiate between  SARS-CoV-2 and other Sarbecovirus currently known to infect humans.  If clinically indicated additional testing with an alternate test  methodology [phone]) is advised. The SARS-CoV-2 RNA is generally  detectable in upper and lower respiratory sp ecimens during the acute  phase of infection. The expected result is Negative. Fact Sheet for Patients:  StrictlyIdeas.no Fact Sheet for Healthcare Providers: BankingDealers.co.za This test is not yet approved or cleared by the [place] FDA and has been authorized for detection and/or diagnosis of SARS-CoV-2 by FDA under an Emergency Use Authorization (EUA).  This EUA will remain in effect (meaning this test can be used) for the duration of the COVID-19 declaration under Section 564(b)(1) of the Act, 21 U.S.C. section 360bbb-3(b)(1), unless the authorization is terminated or revoked sooner. Performed at [facility], [place] [address], [place], [place] [address]      Radiological Exams on Admission: Ct Abdomen Pelvis W Contrast  Result Date: 04/26/2019 CLINICAL DATA:  Acute generalized abdominal pain. EXAM: CT ABDOMEN AND PELVIS WITH CONTRAST TECHNIQUE: Multidetector CT imaging of the abdomen and pelvis was performed using the standard protocol following bolus administration of intravenous contrast. CONTRAST:  129m OMNIPAQUE  IOHEXOL 300 MG/ML  SOLN COMPARISON:  December 08, 2014. FINDINGS: Lower chest: No acute abnormality. Hepatobiliary: No focal liver abnormality is seen. Status post cholecystectomy. No biliary dilatation. Pancreas: Unremarkable. No pancreatic ductal dilatation or surrounding inflammatory changes. Spleen: Normal in size without focal abnormality. Adrenals/Urinary Tract: Adrenal glands appear normal. Stable bilateral renal cysts are noted. No hydronephrosis or renal obstruction is noted. No renal or1 ureteral calculi are noted. Small bladder diverticulum is noted, but otherwise urinary bladder is unremarkable, although partly obscured by scatter artifact arising from bilateral hip prostheses. Stomach/Bowel: The stomach is unremarkable. Status post partial colectomy. There remains probable scarring extending between the proximal duodenum, ileum and colon which is unchanged compared to prior exam. Fistulous connection cannot be excluded. No acute bowel inflammation or dilatation is noted. Vascular/Lymphatic: Aortic atherosclerosis. No enlarged abdominal or pelvic lymph nodes. Reproductive: Not well visualized due to scatter artifact arising from bilateral hip arthroplasties. Other: No abdominal wall hernia or abnormality. No abdominopelvic ascites. Musculoskeletal: Bilateral hip prosthesis as noted above. No acute abnormality is noted. IMPRESSION: Stable probable scarring is seen extending between the proximal duodenum, ileum and colon. Fistulous connection cannot be excluded. No definite evidence of acute bowel inflammation or obstruction is noted. Aortic Atherosclerosis (ICD10-I70.0). Electronically Signed   By: JMarijo ConceptionM.D.   On: 04/26/2019 17:59   Dg Chest Port 1 View  Result Date: 04/26/2019 CLINICAL DATA:  Shortness of breath. New onset upper abdominal pain. EXAM: PORTABLE CHEST 1 VIEW COMPARISON:  02/15/2019. Chest CTA dated 07/15/2017. FINDINGS: Stable enlarged cardiac silhouette. Stable elevated right  hemidiaphragm with a probable large eventration. Clear lungs with normal vascularity. No pleural fluid. Mild diffuse osteopenia. Thoracic and right shoulder degenerative changes with superior migration of the right humeral head, compatible with a large, chronic rotator cuff tear. IMPRESSION: 1. No acute abnormality. 2. Stable cardiomegaly. Electronically Signed   By: Claudie Revering M.D.   On: 04/26/2019 13:50     All images have been reviewed by me personally.    Assessment/Plan Principal Problem:   Bacteremia due to Escherichia coli Active Problems:   Crohn disease ([place])   Sepsis ([place])   Peripheral arterial disease ([place])   DM (diabetes mellitus), type 2 with peripheral vascular complications ([place])    Bacteremia secondary to E. coli/Enterobacter Sepsis secondary to gram-negative rods -Unknown clear source GI versus GU versus left stump -Follow-up culture sensitivities.  Continue IV cefepime -MRI left stump offered- patient and wife does not want this to be done as they were assured by Dr Carlis Abbott there is no underlying infection and its healing well. I have explained MRI is to be thorough but still would not want one at this time.  -IV fluids.  Supportive care.  Trend lactic acid -UA-negative -CT abdomen pelvis done 10/19-stable probable scarring extending between proximal duodenum, ileum and colon.  Fistula cannot be excluded.  Peripheral vascular disease status post recent left-sided BKA 04/09/2019 -Seen by vascular surgery this morning, granulating nicely.  Recommend wound VAC change  3 times weekly -Consult wound care for wound VAC placement tomorrow morning.  If they are not able to, we can let vascular surgery know.  Crohn's disease -This is managed by gastroenterologist at [facility].  Continue prednisone with caution especially the setting of active infection.  Diabetes mellitus type 2, insulin-dependent -Insulin sliding scale and Accu-Chek.  Essential hypertension  -Holding off on Norvasc.  Stabilize blood pressure and slowly resume as necessary.  DVT prophylaxis: Lovenox Code Status: Full code Family Communication: Wife at bedside Disposition Plan: Hospital admission to [place] floor Consults called: Infectious disease, wound care Admission status: Inpatient admission for IV antibiotics due to bacteremia   Time Spent: 65 minutes.  >50% of the time was devoted to discussing the patients care, assessment, plan and disposition with other care givers along with counseling the patient about the risks and benefits of treatment.    Ambers Iyengar Arsenio Loader MD [facility]  If 7PM-7AM, please contact night-coverage   04/27/2019, 4:44 PM

## 2019-04-27 NOTE — ED Triage Notes (Signed)
Pt was called this morning to return for antibiotics due to + blood cultures yesterday.

## 2019-04-27 NOTE — ED Provider Notes (Signed)
[facility] DEPT Provider Note   CSN: [id number] Arrival date & time: 04/27/19  1111     History   Chief Complaint No chief complaint on file.   HPI Rodney Sullivan is a 78 y.o. male.     HPI Patient was seen yesterday in the emergency department for abdominal pain.  He had history of Crohn's disease, diabetes, peripheral vascular disease and is status post amputation of the left lower leg.  Patient was somewhat confused and lethargic yesterday.  Diagnostic evaluation was undertaken.  Blood cultures were obtained.  Abdominal pain resolved during time of evaluation.  Patient was empirically treated with cefepime.  His CT did not show any acute findings.  Patient was discharged home.  Blood cultures today are positive for E. coli.  Patient was called to return to the hospital.  Patient and his wife reports that he is much better today than he was at time of presentation.  They report that yesterday he was confused and he does not even remember the visit.  Today his mental status is much more clear.  He has not developed fever in the meantime.  He was seen by his vascular surgeon today and his lower extremity wound was examined, cleaned and redressed.  Review of the EMR indicates that the wound was in good condition.  There is plan in place to put a wound VAC on tomorrow. Past Medical History:  Diagnosis Date   Arthritis    Bacteremia due to Escherichia coli 11/2013   Cancer ([place])    basal skin- arm, arm squamous   Crohn disease ([place])    Diabetes mellitus without complication ([place])    DM (diabetes mellitus), type 2 with peripheral vascular complications ([place]) 03/02/4157   DVT (deep venous thrombosis) ([place]) 11/27/13   Dyspnea    at times   History of kidney stones    passed   History of skin cancer    [facility]   Peripheral arterial disease ([place]) 02/15/2019   Pulmonary embolism ([employer]) 11/27/13    Shoulder problem    left torn ligament-      Patient Active Problem List   Diagnosis Date Noted   Gangrene of left foot ([place]) 03/01/2019   Sepsis ([place]) 02/15/2019   Anemia 02/15/2019   Cellulitis and abscess of toe of left foot 02/15/2019   Peripheral arterial disease ([place]) 02/15/2019   DM (diabetes mellitus), type 2 with peripheral vascular complications ([place]) 30/94/0768   Gout 02/15/2019   Septic shock ([place]) 12/14/2013   Pulmonary embolism ([place]) 12/14/2013   Crohn disease ([place]) 12/14/2013    Past Surgical History:  Procedure Laterality Date   ABDOMINAL AORTOGRAM N/A 12/14/2018   Procedure: ABDOMINAL AORTOGRAM;  Surgeon: Waynetta Sandy, MD;  Location: [facility];  Service: Cardiovascular;  Laterality: N/A;   ABDOMINAL AORTOGRAM W/LOWER EXTREMITY Bilateral 02/17/2019   Procedure: ABDOMINAL AORTOGRAM W/LOWER EXTREMITY;  Surgeon: [name], MD;  Location: [facility];  Service: Cardiovascular;  Laterality: Bilateral;   ABDOMINAL SURGERY     Bowel resection x2   AMPUTATION Right 12/21/2018   Procedure: RIGHT LONG FINGER AMPUTATION;  Surgeon: Leanora Cover, MD;  Location: [facility];  Service: Orthopedics;  Laterality: Right;   AMPUTATION Left 03/03/2019   Procedure: AMPUTATION BELOW KNEE;  Surgeon: [name], MD;  Location: [facility];  Service: Vascular;  Laterality: Left;   APPLICATION OF WOUND VAC Left 04/09/2019   Procedure: APPLICATION OF WOUND VAC;  Surgeon: [name], MD;  Location: [facility];  Service: Vascular;  Laterality: Left;   CHOLECYSTECTOMY     COLON SURGERY     HIP SURGERY Bilateral    replacement   JOINT REPLACEMENT     LOWER EXTREMITY ANGIOGRAPHY Right 12/14/2018   Procedure: LOWER EXTREMITY ANGIOGRAPHY;  Surgeon: Waynetta Sandy, MD;  Location: [facility];  Service: Cardiovascular;  Laterality: Right;   PERIPHERAL VASCULAR ATHERECTOMY  02/17/2019   Procedure: PERIPHERAL VASCULAR ATHERECTOMY;  Surgeon: [name], MD;  Location: [facility];  Service: Cardiovascular;;   UPPER EXTREMITY ANGIOGRAPHY Right 12/14/2018   Procedure: Right UPPER EXTREMITY ANGIOGRAPHY;  Surgeon: Waynetta Sandy, MD;  Location: [facility];  Service: Cardiovascular;  Laterality: Right;   WOUND DEBRIDEMENT Left 04/09/2019   Procedure: DEBRIDEMENT WOUND LEFT BELOW KNEE STUMP;  Surgeon: [name], MD;  Location: [facility];  Service: Vascular;  Laterality: Left;        Home Medications    Prior to Admission medications   Medication Sig Start Date End Date Taking? Authorizing Provider  acetaminophen (TYLENOL) 500 MG tablet Take 500 mg by mouth every 6 (six) hours as needed.    [provider]  albuterol (VENTOLIN HFA) 108 (90 Base) MCG/ACT inhaler Inhale 2 puffs into the lungs every 4 (four) hours as needed for wheezing or shortness of breath.  11/20/18   [provider]  allopurinol (ZYLOPRIM) 100 MG tablet Take 100 mg by mouth daily.    [provider]  amLODipine (NORVASC) 2.5 MG tablet Take 2.5 mg by mouth daily with supper.  11/18/18   [provider]  aspirin EC 81 MG tablet Take 81 mg by mouth daily with supper.     [provider]  benzonatate (TESSALON) 100 MG capsule Take 100 mg by mouth 3 (three) times daily as needed for cough.    [provider]  brimonidine-timolol (COMBIGAN) 0.2-0.5 % ophthalmic solution Place 1 drop into both eyes 2 (two) times a day.    [provider]  cephALEXin (KEFLEX) 500 MG capsule Take 1 capsule (500 mg total) by mouth 3 (three) times daily. Patient not taking: Reported on 04/27/2019 04/09/19   Dagoberto Ligas, PA-C  Certolizumab Pegol (CIMZIA) 2 X 200 MG KIT Inject 400 mg into the skin every 28 (twenty-eight) days.    [provider]  Cholecalciferol (VITAMIN D) 50 MCG (2000 UT) tablet Take 2,000 Units by mouth daily.    [provider]  Cyanocobalamin (B-12) 2500 MCG  TABS Take 2,500 mcg by mouth daily.    [provider]  Dexamethasone Acetate 8 MG/ML SUSP Inject 8 mg into the muscle daily as needed (for Crohn's flare).    [provider]  Emollient (ZIMS CRACK CREME) CREA Apply 1 application topically daily as needed (dry skin).     [provider]  famciclovir (FAMVIR) 250 MG tablet Take 250 mg by mouth 3 (three) times daily as needed (fungus on corner of mouth flare).  11/18/18   [provider]  ferrous sulfate 325 (65 FE) MG tablet Take 325 mg by mouth daily.    [provider]  latanoprost (XALATAN) 0.005 % ophthalmic solution Place 1 drop into both eyes at bedtime. 11/11/14   [provider]  Multiple Vitamin (MULTIVITAMIN WITH MINERALS) TABS Take 1 tablet by mouth daily.    [provider]  Omega-3 Fatty Acids (FISH OIL) 1000 MG CAPS Take 1,000 mg  by mouth daily.    [provider]  OVER THE COUNTER MEDICATION Take 5 mLs by mouth every evening. CBD oil drops     [provider]  oxyCODONE (OXY IR/ROXICODONE) 5 MG immediate release tablet Take 1 tablet (5 mg total) by mouth every 6 (six) hours as needed for severe pain. Patient not taking: Reported on 04/27/2019 04/09/19   Dagoberto Ligas, PA-C  pantoprazole (PROTONIX) 40 MG tablet Take 40 mg by mouth daily at 3 pm. In the afternoon    [provider]  pioglitazone (ACTOS) 15 MG tablet Take 15 mg by mouth daily.  01/13/19   [provider]  pravastatin (PRAVACHOL) 10 MG tablet Take 1 tablet (10 mg total) by mouth every evening. Patient taking differently: Take 10 mg by mouth daily at 3 pm. In the afternoon. 02/20/19 04/05/20  Arrien, Jimmy Picket, MD  predniSONE (DELTASONE) 10 MG tablet Take 20 mg by mouth daily after breakfast. Total daily dose=22.5 mg (take with 0.5 tablet 24m tablet)    [provider]  traMADol (ULTRAM) 50 MG tablet Take 1 tablet (50 mg total) by mouth at bedtime as needed for  moderate pain. Patient taking differently: Take 50 mg by mouth every 6 (six) hours as needed for moderate pain.  03/07/19   RGabriel Earing PA-C    Family History Family History  Problem Relation Age of Onset   Hypertension Mother     Social History Social History   Tobacco Use   Smoking status: Former Smoker    Years: 10.00    Types: Pipe   Smokeless tobacco: Former [employer] developer   Quit date: 12/08/1979  Substance Use Topics   Alcohol use: No    Alcohol/week: 0.0 standard drinks   Drug use: No     Allergies   Ciprofloxacin and Shrimp [shellfish allergy]   Review of Systems Review of Systems 10 Systems reviewed and are negative for acute change except as noted in the HPI.   Physical Exam Updated Vital Signs BP (!) 97/55 (BP Location: Left Arm)    Pulse 80    Temp 97.8 F (36.6 C) (Oral)    Resp 17    Ht _0  (1.702 m)    SpO2 97%    BMI 25.06 kg/m   Physical Exam Constitutional:      Comments: Patient is alert and appropriate.  No respiratory distress.  HENT:     Head: Normocephalic and atraumatic.  Eyes:     Extraocular Movements: Extraocular movements intact.     Conjunctiva/sclera: Conjunctivae normal.  Neck:     Musculoskeletal: Neck supple.  Cardiovascular:     Rate and Rhythm: Normal rate and regular rhythm.  Pulmonary:     Effort: Pulmonary effort is normal.     Breath sounds: Normal breath sounds.  Abdominal:     General: There is no distension.     Palpations: Abdomen is soft.     Tenderness: There is no abdominal tenderness. There is no guarding.  Musculoskeletal:     Comments: Patient's left lower extremity is dressed with Ace wrap on the amputation site.  (This was not removed patient was just seen today and wound was treated by vascular surgery).  Skin:    General: Skin is warm and dry.  Neurological:     General: No focal deficit present.     Mental Status: He is oriented to person, place, and time.     Coordination: Coordination normal.    Psychiatric:  Mood and Affect: Mood normal.      ED Treatments / Results  Labs (all labs ordered are listed, but only abnormal results are displayed) Labs Reviewed  CULTURE, BLOOD (ROUTINE X 2)  CULTURE, BLOOD (ROUTINE X 2)  URINE CULTURE  BASIC METABOLIC PANEL  CBC WITH DIFFERENTIAL/PLATELET  LACTIC ACID, PLASMA  LACTIC ACID, PLASMA  URINALYSIS, ROUTINE W REFLEX MICROSCOPIC    EKG Muse will not upload the images or show them.  Radiology Ct Abdomen Pelvis W Contrast  Result Date: 04/26/2019 CLINICAL DATA:  Acute generalized abdominal pain. EXAM: CT ABDOMEN AND PELVIS WITH CONTRAST TECHNIQUE: Multidetector CT imaging of the abdomen and pelvis was performed using the standard protocol following bolus administration of intravenous contrast. CONTRAST:  119m OMNIPAQUE IOHEXOL 300 MG/ML  SOLN COMPARISON:  December 08, 2014. FINDINGS: Lower chest: No acute abnormality. Hepatobiliary: No focal liver abnormality is seen. Status post cholecystectomy. No biliary dilatation. Pancreas: Unremarkable. No pancreatic ductal dilatation or surrounding inflammatory changes. Spleen: Normal in size without focal abnormality. Adrenals/Urinary Tract: Adrenal glands appear normal. Stable bilateral renal cysts are noted. No hydronephrosis or renal obstruction is noted. No renal or1 ureteral calculi are noted. Small bladder diverticulum is noted, but otherwise urinary bladder is unremarkable, although partly obscured by scatter artifact arising from bilateral hip prostheses. Stomach/Bowel: The stomach is unremarkable. Status post partial colectomy. There remains probable scarring extending between the proximal duodenum, ileum and colon which is unchanged compared to prior exam. Fistulous connection cannot be excluded. No acute bowel inflammation or dilatation is noted. Vascular/Lymphatic: Aortic atherosclerosis. No enlarged abdominal or pelvic lymph nodes. Reproductive: Not well visualized due to scatter  artifact arising from bilateral hip arthroplasties. Other: No abdominal wall hernia or abnormality. No abdominopelvic ascites. Musculoskeletal: Bilateral hip prosthesis as noted above. No acute abnormality is noted. IMPRESSION: Stable probable scarring is seen extending between the proximal duodenum, ileum and colon. Fistulous connection cannot be excluded. No definite evidence of acute bowel inflammation or obstruction is noted. Aortic Atherosclerosis (ICD10-I70.0). Electronically Signed   By: JMarijo ConceptionM.D.   On: 04/26/2019 17:59   Dg Chest Port 1 View  Result Date: 04/26/2019 CLINICAL DATA:  Shortness of breath. New onset upper abdominal pain. EXAM: PORTABLE CHEST 1 VIEW COMPARISON:  02/15/2019. Chest CTA dated 07/15/2017. FINDINGS: Stable enlarged cardiac silhouette. Stable elevated right hemidiaphragm with a probable large eventration. Clear lungs with normal vascularity. No pleural fluid. Mild diffuse osteopenia. Thoracic and right shoulder degenerative changes with superior migration of the right humeral head, compatible with a large, chronic rotator cuff tear. IMPRESSION: 1. No acute abnormality. 2. Stable cardiomegaly. Electronically Signed   By: SClaudie ReveringM.D.   On: 04/26/2019 13:50    Procedures Procedures (including critical care time) CRITICAL CARE Performed by: MCharlesetta Shanks  Total critical care time: 30 minutes  Critical care time was exclusive of separately billable procedures and treating other patients.  Critical care was necessary to treat or prevent imminent or life-threatening deterioration.  Critical care was time spent personally by me on the following activities: development of treatment plan with patient and/or surrogate as well as nursing, discussions with consultants, evaluation of patient's response to treatment, examination of patient, obtaining history from patient or surrogate, ordering and performing treatments and interventions, ordering and review of  laboratory studies, ordering and review of radiographic studies, pulse oximetry and re-evaluation of patient's condition. Medications Ordered in ED Medications  meropenem (MERREM) 1 g in sodium chloride 0.9 % 100 mL IVPB (has no  administration in time range)  0.9 %  sodium chloride infusion (has no administration in time range)     Initial Impression / Assessment and Plan / ED Course  I have reviewed the triage vital signs and the nursing notes.  Pertinent labs & imaging results that were available during my care of the patient were reviewed by me and considered in my medical decision making (see chart for details).  Clinical Course as of Apr 26 1333  Tue Apr 27, 2019  1329 Consult: Reviewed with Dr. Johnnye Sima, suggest continuing with cefepime for bacteremia.   [MP]    Clinical Course User Index [MP] Charlesetta Shanks, MD      Consult:DR Amin for admission  Patient was seen yesterday and has positive blood cultures.  Lactate is more elevated than yesterday.  Mental status is clear.  No respiratory distress.  Will initiate IV antibiotics and fluid resuscitation.  Plan for admission for ongoing antibiotic therapy. Final Clinical Impressions(s) / ED Diagnoses   Final diagnoses:  Bacteremia  Severe comorbid illness    ED Discharge Orders    None       Charlesetta Shanks, MD 04/27/19 1524

## 2019-04-27 NOTE — Consult Note (Addendum)
[facility]ious Disease    Date of Admission:  04/27/2019   Total days of antibiotics:0 cefepime               Reason for Consult: GNR bacteremia    Referring Provider: Pfeiffer   Assessment: E coli bacteremia DM2 Crohn's  Plan: 1. Continue cefepime 2. Await more information from his BCx 3. Repeat BCx 4. Consider GI eval with his crohn's, CT findings and bacteremia.  5. Could also consider MRI of his stump  Comment He has improved with cephalosporin so will not expand his coverage.   Thank you so much for this interesting consult,  Principal Problem:   Bacteremia due to Escherichia coli Active Problems:   Crohn disease ([place])   Sepsis ([place])   Peripheral arterial disease ([place])   DM (diabetes mellitus), type 2 with peripheral vascular complications ([place])   . allopurinol  100 mg Oral Daily  . aspirin EC  81 mg Oral Q supper  . B-12  2,500 mcg Oral Daily  . enoxaparin (LOVENOX) injection  40 mg Subcutaneous Q24H  . ferrous sulfate  325 mg Oral Daily  . insulin aspart  0-15 Units Subcutaneous TID WC  . insulin aspart  0-5 Units Subcutaneous QHS  . insulin aspart  3 Units Subcutaneous TID WC  . multivitamin with minerals  1 tablet Oral Daily  . pantoprazole  40 mg Oral Q1500  . pravastatin  10 mg Oral QPM  . [START ON 04/28/2019] predniSONE  20 mg Oral QPC breakfast  . Vitamin D  2,000 Units Oral Daily    HPI: Rodney Sullivan is a 78 y.o. male with hx of Crohn's and DM2 with neuropathy (x 5 yrs), had L BKA and atherectomy (PT) 03-03-2019. He states at that time he had and infection his LLE and taht he was septic. He states that he has repeated infections over the last several years.  He was seen by his home health aide 10-19 and had wound vac changed. He as noted to have shaking chills at that time.Temp normal, BP normal at home. The home health aide advised pt see MD and family was unable to get him into the car. He as taken by EMS to to [facility] ED.  He improved, was given cefepime in ED (has limited recollection of the visit).  He ha CT abd showing: Stable probable scarring is seen extending between the proximal duodenum, ileum and colon. Fistulous connection cannot be excluded. No definite evidence of acute bowel inflammation or obstruction is noted. He returned home.  He was called back today when his BCx were noted to be positive  For E coli and enterobacter.  He feels well today.  He has noted no change in his wound, no d/c.   Review of Systems: Review of Systems  Constitutional: Positive for chills. Negative for fever.  Respiratory: Negative for cough and shortness of breath.   Gastrointestinal: Negative for abdominal pain, constipation and diarrhea.  Genitourinary: Negative for dysuria.  Psychiatric/Behavioral: Positive for memory loss.  Please see HPI. All other systems reviewed and negative.   Past Medical History:  Diagnosis Date  . Arthritis   . Bacteremia due to Escherichia coli 11/2013  . Cancer (HCC)    basal skin- arm, arm squamous  . Crohn disease ([place])   . Diabetes mellitus without complication ([place])   . DM (diabetes mellitus), type 2 with peripheral vascular complications ([place]) 7/59/1638  . DVT (deep  venous thrombosis) ([place]) 11/27/13  . Dyspnea    at times  . History of kidney stones    passed  . History of skin cancer    ARMS & FACE  . Peripheral arterial disease ([place]) 02/15/2019  . Pulmonary embolism ([place]) 11/27/13   . Shoulder problem    left torn ligament-     Social History   Tobacco Use  . Smoking status: Former Smoker    Years: 10.00    Types: Pipe  . Smokeless tobacco: Former Systems developer    Quit date: 12/08/1979  Substance Use Topics  . Alcohol use: No    Alcohol/week: 0.0 standard drinks  . Drug use: No    Family History  Problem Relation Age of Onset  . Hypertension Mother      Medications:  Scheduled: . allopurinol  100 mg Oral Daily  . aspirin EC  81 mg Oral Q supper  . B-12   2,500 mcg Oral Daily  . enoxaparin (LOVENOX) injection  40 mg Subcutaneous Q24H  . [START ON 04/28/2019] ferrous sulfate  325 mg Oral Daily  . insulin aspart  0-15 Units Subcutaneous TID WC  . insulin aspart  0-5 Units Subcutaneous QHS  . insulin aspart  3 Units Subcutaneous TID WC  . multivitamin with minerals  1 tablet Oral Daily  . pantoprazole  40 mg Oral Q supper  . pravastatin  10 mg Oral Q supper  . [START ON 04/28/2019] predniSONE  20 mg Oral QPC breakfast  . Vitamin D  2,000 Units Oral Daily    Abtx:  Anti-infectives (From admission, onward)   Start     Dose/Rate Route Frequency Ordered Stop   04/28/19 0000  cefTRIAXone (ROCEPHIN) 2 g in sodium chloride 0.9 % 100 mL IVPB     2 g 200 mL/hr over 30 Minutes Intravenous Every 24 hours 04/27/19 1527     04/27/19 1400  meropenem (MERREM) 1 g in sodium chloride 0.9 % 100 mL IVPB  Status:  Discontinued     1 g 200 mL/hr over 30 Minutes Intravenous Every 8 hours 04/27/19 1313 04/27/19 1328   04/27/19 1400  cefTRIAXone (ROCEPHIN) 2 g in sodium chloride 0.9 % 100 mL IVPB     2 g 200 mL/hr over 30 Minutes Intravenous  Once 04/27/19 1352 04/27/19 1459   04/27/19 1330  ceFEPIme (MAXIPIME) 1 g in sodium chloride 0.9 % 100 mL IVPB  Status:  Discontinued     1 g 200 mL/hr over 30 Minutes Intravenous  Once 04/27/19 1329 04/27/19 1352        OBJECTIVE: Blood pressure 105/63, pulse 72, temperature 97.8 F (36.6 C), temperature source Oral, resp. rate 16, height 5' 7"  (1.702 m), SpO2 98 %.  Physical Exam Constitutional:      Appearance: Normal appearance.  HENT:     Mouth/Throat:     Mouth: Mucous membranes are moist.     Pharynx: No oropharyngeal exudate.  Eyes:     Extraocular Movements: Extraocular movements intact.     Pupils: Pupils are equal, round, and reactive to light.  Neck:     Musculoskeletal: Normal range of motion and neck supple.  Cardiovascular:     Rate and Rhythm: Normal rate and regular rhythm.  Pulmonary:      Effort: Pulmonary effort is normal.     Breath sounds: Normal breath sounds.  Abdominal:     General: Bowel sounds are normal. There is no distension.     Palpations: Abdomen is  soft.     Tenderness: There is no abdominal tenderness.  Musculoskeletal:     Right lower leg: No edema.       Legs:       Feet:  Neurological:     Mental Status: He is alert.   please see images for photo of wound.   Lab Results Results for orders placed or performed during the hospital encounter of 04/27/19 (from the past 48 hour(s))  Basic metabolic panel     Status: Abnormal   Collection Time: 04/27/19  2:12 PM  Result Value Ref Range   Sodium 140 135 - 145 mmol/L   Potassium 3.7 3.5 - 5.1 mmol/L   Chloride 108 98 - 111 mmol/L   CO2 21 (L) 22 - 32 mmol/L   Glucose, Bld 253 (H) 70 - 99 mg/dL   BUN 21 8 - 23 mg/dL   Creatinine, Ser 0.90 0.61 - 1.24 mg/dL   Calcium 8.9 8.9 - 10.3 mg/dL   GFR calc non Af Amer >60 >60 mL/min   GFR calc Af Amer >60 >60 mL/min   Anion gap 11 5 - 15    Comment: Performed at [facility], [place] [address] [place], [place] [address]  CBC with Differential     Status: Abnormal   Collection Time: 04/27/19  2:12 PM  Result Value Ref Range   WBC 17.8 (H) 4.0 - 10.5 K/uL   RBC 4.02 (L) 4.22 - 5.81 MIL/uL   Hemoglobin 10.4 (L) 13.0 - 17.0 g/dL   HCT 35.6 (L) 39.0 - 52.0 %   MCV 88.6 80.0 - 100.0 fL   MCH 25.9 (L) 26.0 - 34.0 pg   MCHC 29.2 (L) 30.0 - 36.0 g/dL   RDW 14.8 11.5 - 15.5 %   Platelets 298 150 - 400 K/uL   nRBC 0.0 0.0 - 0.2 %   Neutrophils Relative % 77 %   Neutro Abs 13.9 (H) 1.7 - 7.7 K/uL   Lymphocytes Relative 14 %   Lymphs Abs 2.5 0.7 - 4.0 K/uL   Monocytes Relative 8 %   Monocytes Absolute 1.3 (H) 0.1 - 1.0 K/uL   Eosinophils Relative 0 %   Eosinophils Absolute 0.0 0.0 - 0.5 K/uL   Basophils Relative 0 %   Basophils Absolute 0.0 0.0 - 0.1 K/uL   Immature Granulocytes 1 %   Abs Immature Granulocytes 0.12 (H) 0.00 - 0.07 K/uL     Comment: Performed at [facility], [place] [address] [place], [place] [address]  Lactic acid, plasma     Status: Abnormal   Collection Time: 04/27/19  2:12 PM  Result Value Ref Range   Lactic Acid, Venous 5.1 (HH) 0.5 - 1.9 mmol/L    Comment: CRITICAL RESULT CALLED TO, READ BACK BY AND VERIFIED WITH: S.WEST RN AT 1500 ON 04/27/2019 BY S.VANHOORNE Performed at [facility], [place] [address], [place],  [address]   Urinalysis, Routine w reflex microscopic     Status: Abnormal   Collection Time: 04/27/19  2:12 PM  Result Value Ref Range   Color, Urine YELLOW YELLOW   APPearance HAZY (A) CLEAR   Specific Gravity, Urine 1.032 (H) 1.005 - 1.030   pH 5.0 5.0 - 8.0   Glucose, UA 50 (A) NEGATIVE mg/dL   Hgb urine dipstick NEGATIVE NEGATIVE   Bilirubin Urine NEGATIVE NEGATIVE   Ketones, ur NEGATIVE NEGATIVE mg/dL   Protein, ur NEGATIVE NEGATIVE mg/dL   Nitrite NEGATIVE NEGATIVE  Leukocytes,Ua NEGATIVE NEGATIVE    Comment: Performed at [facility], [place] [address], [place], [place] [address]      Component Value Date/Time   SDES  04/26/2019 1505    BLOOD RIGHT HAND Performed at [facility], [place] [address] [place], [place] [address]    SDES  04/26/2019 1505    BLOOD RIGHT ANTECUBITAL Performed at [facility], [place] [address], [place], [place] [address]    SDES  04/26/2019 1505    IN/OUT CATH URINE Performed at [facility], [place] [address], [place], [place] [address]    [place]  04/26/2019 1505    BOTTLES DRAWN AEROBIC AND ANAEROBIC Blood Culture adequate volume Performed at [place] [address], [place], [place] [address]    [place]  04/26/2019 1505    BOTTLES DRAWN AEROBIC AND ANAEROBIC Blood Culture adequate volume Performed at [facility] [address], [place], [place] [address]    SPECREQUEST   04/26/2019 1505    NONE Performed at [facility], [place] [address], [place], [place] [address]    CULT  04/26/2019 1505    CULTURE REINCUBATED FOR BETTER GROWTH Performed at [facility], [place] [address], [place], [place] [address]    CULT  04/26/2019 1505    NO GROWTH < 24 HOURS Performed at [facility], [place] [address] [place], [place] [address]    CULT  04/26/2019 1505    CULTURE REINCUBATED FOR BETTER GROWTH Performed at [facility], [place] [address] [place], [place] [address]    REPTSTATUS PENDING 04/26/2019 1505   REPTSTATUS PENDING 04/26/2019 1505   REPTSTATUS PENDING 04/26/2019 1505   Ct Abdomen Pelvis W Contrast  Result Date: 04/26/2019 CLINICAL DATA:  Acute generalized abdominal pain. EXAM: CT ABDOMEN AND PELVIS WITH CONTRAST TECHNIQUE: Multidetector CT imaging of the abdomen and pelvis was performed using the standard protocol following bolus administration of intravenous contrast. CONTRAST:  156m OMNIPAQUE IOHEXOL 300 MG/ML  SOLN COMPARISON:  December 08, 2014. FINDINGS: Lower chest: No acute abnormality. Hepatobiliary: No focal liver abnormality is seen. Status post cholecystectomy. No biliary dilatation. Pancreas: Unremarkable. No pancreatic ductal dilatation or surrounding inflammatory changes. Spleen: Normal in size without focal abnormality. Adrenals/Urinary Tract: Adrenal glands appear normal. Stable bilateral renal cysts are noted. No hydronephrosis or renal obstruction is noted. No renal or1 ureteral calculi are noted. Small bladder diverticulum is noted, but otherwise urinary bladder is unremarkable, although partly obscured by scatter artifact arising from bilateral hip prostheses. Stomach/Bowel: The stomach is unremarkable. Status post partial colectomy. There remains probable scarring extending between the proximal duodenum, ileum and colon which is unchanged compared to prior exam. Fistulous connection cannot be excluded. No acute bowel  inflammation or dilatation is noted. Vascular/Lymphatic: Aortic atherosclerosis. No enlarged abdominal or pelvic lymph nodes. Reproductive: Not well visualized due to scatter artifact arising from bilateral hip arthroplasties. Other: No abdominal wall hernia or abnormality. No abdominopelvic ascites. Musculoskeletal: Bilateral hip prosthesis as noted above. No acute abnormality is noted. IMPRESSION: Stable probable scarring is seen extending between the proximal duodenum, ileum and colon. Fistulous connection cannot be excluded. No definite evidence of acute bowel inflammation or obstruction is noted. Aortic Atherosclerosis (ICD10-I70.0). Electronically Signed   By: JMarijo ConceptionM.D.   On: 04/26/2019 17:59   Dg Chest Port 1 View  Result Date: 04/26/2019 CLINICAL DATA:  Shortness of breath. New onset upper abdominal pain. EXAM: PORTABLE  CHEST 1 VIEW COMPARISON:  02/15/2019. Chest CTA dated 07/15/2017. FINDINGS: Stable enlarged cardiac silhouette. Stable elevated right hemidiaphragm with a probable large eventration. Clear lungs with normal vascularity. No pleural fluid. Mild diffuse osteopenia. Thoracic and right shoulder degenerative changes with superior migration of the right humeral head, compatible with a large, chronic rotator cuff tear. IMPRESSION: 1. No acute abnormality. 2. Stable cardiomegaly. Electronically Signed   By: Claudie Revering M.D.   On: 04/26/2019 13:50   Recent Results (from the past 240 hour(s))  Culture, blood (routine x 2)     Status: None (Preliminary result)   Collection Time: 04/26/19  3:05 PM   Specimen: BLOOD RIGHT HAND  Result Value Ref Range Status   Specimen Description   Final    BLOOD RIGHT HAND Performed at [facility] [place] [address] [place], [place] [address]    Special Requests   Final    BOTTLES DRAWN AEROBIC AND ANAEROBIC Blood Culture adequate volume Performed at [facility] [address] [place], [place] [address]     Culture  Setup Time   Final    GRAM NEGATIVE RODS ANAEROBIC BOTTLE ONLY CRITICAL RESULT CALLED TO, READ BACK BY AND VERIFIED WITH: C. REED, RN (MCHP) AT [phone] ON 04/27/19 BY C. JESSUP, MT.    Culture   Final    CULTURE REINCUBATED FOR BETTER GROWTH Performed at [facility], [place] [address], [place], [place] [address]    Report Status PENDING  Incomplete  Culture, blood (routine x 2)     Status: None (Preliminary result)   Collection Time: 04/26/19  3:05 PM   Specimen: BLOOD  Result Value Ref Range Status   Specimen Description   Final    BLOOD RIGHT ANTECUBITAL Performed at [place] [address] [place], [place] [address]    Special Requests   Final    BOTTLES DRAWN AEROBIC AND ANAEROBIC Blood Culture adequate volume Performed at [place] [address], [place], [place] [address]    Culture   Final    NO GROWTH < 24 HOURS Performed at [facility] [address] [place], [place] [address]    Report Status PENDING  Incomplete  Urine culture     Status: None (Preliminary result)   Collection Time: 04/26/19  3:05 PM   Specimen: In/Out Cath Urine  Result Value Ref Range Status   Specimen Description   Final    IN/OUT CATH URINE Performed at [facility] [address], [place], [place] [address]    Special Requests   Final    NONE Performed at [facility], [place] [address], [place], [place] [address]    Culture   Final    CULTURE REINCUBATED FOR BETTER GROWTH Performed at [facility], [place] [address] [place], [place] [address]    Report Status PENDING  Incomplete  Blood Culture ID Panel (Reflexed)     Status: Abnormal   Collection Time: 04/26/19  3:05 PM  Result Value Ref Range Status   Enterococcus species NOT DETECTED NOT DETECTED Final   Listeria monocytogenes NOT DETECTED NOT DETECTED Final   Staphylococcus species NOT DETECTED NOT DETECTED Final    Staphylococcus aureus (BCID) NOT DETECTED NOT DETECTED Final   Streptococcus species NOT DETECTED NOT DETECTED Final   Streptococcus agalactiae NOT DETECTED NOT DETECTED Final   Streptococcus pneumoniae NOT DETECTED NOT DETECTED Final   Streptococcus pyogenes NOT  DETECTED NOT DETECTED Final   Acinetobacter baumannii NOT DETECTED NOT DETECTED Final   Enterobacteriaceae species DETECTED (A) NOT DETECTED Final    Comment: Enterobacteriaceae represent a large family of gram-negative bacteria, not a single organism. CRITICAL RESULT CALLED TO, READ BACK BY AND VERIFIED WITH: C. REED, RN (MCHP) AT [phone] ON 04/27/19 BY C. JESSUP, MT.    Enterobacter cloacae complex NOT DETECTED NOT DETECTED Final   Escherichia coli DETECTED (A) NOT DETECTED Final    Comment: CRITICAL RESULT CALLED TO, READ BACK BY AND VERIFIED WITH: C. REED, RN (MCHP) AT 8768 ON 04/27/19 BY C. JESSUP, MT.    Klebsiella oxytoca NOT DETECTED NOT DETECTED Final   Klebsiella pneumoniae NOT DETECTED NOT DETECTED Final   Proteus species NOT DETECTED NOT DETECTED Final   Serratia marcescens NOT DETECTED NOT DETECTED Final   Carbapenem resistance NOT DETECTED NOT DETECTED Final   Haemophilus influenzae NOT DETECTED NOT DETECTED Final   Neisseria meningitidis NOT DETECTED NOT DETECTED Final   Pseudomonas aeruginosa NOT DETECTED NOT DETECTED Final   Candida albicans NOT DETECTED NOT DETECTED Final   Candida glabrata NOT DETECTED NOT DETECTED Final   Candida krusei NOT DETECTED NOT DETECTED Final   Candida parapsilosis NOT DETECTED NOT DETECTED Final   Candida tropicalis NOT DETECTED NOT DETECTED Final    Comment: Performed at [facility], [place] [address], [place], [place] [address]  SARS Coronavirus 2 by RT PCR (hospital order, performed in [facility] hospital lab) Nasopharyngeal Nasopharyngeal Swab     Status: None   Collection Time: 04/26/19  5:20 PM   Specimen: Nasopharyngeal Swab  Result Value Ref Range Status   SARS  Coronavirus 2 NEGATIVE NEGATIVE Final    Comment: (NOTE) If result is NEGATIVE SARS-CoV-2 target nucleic acids are NOT DETECTED. The SARS-CoV-2 RNA is generally detectable in upper and lower  respiratory specimens during the acute phase of infection. The lowest  concentration of SARS-CoV-2 viral copies this assay can detect is 250  copies / mL. A negative result does not preclude SARS-CoV-2 infection  and should not be used as the sole basis for treatment or other  patient management decisions.  A negative result may occur with  improper specimen collection / handling, submission of specimen other  than nasopharyngeal swab, presence of viral mutation(s) within the  areas targeted by this assay, and inadequate number of viral copies  (<250 copies / mL). A negative result must be combined with clinical  observations, patient history, and epidemiological information. If result is POSITIVE SARS-CoV-2 target nucleic acids are DETECTED. The SARS-CoV-2 RNA is generally detectable in upper and lower  respiratory specimens dur ing the acute phase of infection.  Positive  results are indicative of active infection with SARS-CoV-2.  Clinical  correlation with patient history and other diagnostic information is  necessary to determine patient infection status.  Positive results do  not rule out bacterial infection or co-infection with other viruses. If result is PRESUMPTIVE POSTIVE SARS-CoV-2 nucleic acids MAY BE PRESENT.   A presumptive positive result was obtained on the submitted specimen  and confirmed on repeat testing.  While 2019 novel coronavirus  (SARS-CoV-2) nucleic acids may be present in the submitted sample  additional confirmatory testing may be necessary for epidemiological  and / or clinical management purposes  to differentiate between  SARS-CoV-2 and other Sarbecovirus currently known to infect humans.  If clinically indicated additional testing with an alternate test   methodology [id number]) is advised. The SARS-CoV-2 RNA  is generally  detectable in upper and lower respiratory sp ecimens during the acute  phase of infection. The expected result is Negative. Fact Sheet for Patients:  StrictlyIdeas.no Fact Sheet for Healthcare Providers: BankingDealers.co.za This test is not yet approved or cleared by the [place] FDA and has been authorized for detection and/or diagnosis of SARS-CoV-2 by FDA under an Emergency Use Authorization (EUA).  This EUA will remain in effect (meaning this test can be used) for the duration of the COVID-19 declaration under Section 564(b)(1) of the Act, 21 U.S.C. section 360bbb-3(b)(1), unless the authorization is terminated or revoked sooner. Performed at [facility], [place] [address] [place], [place] [address]     Microbiology: Recent Results (from the past 240 hour(s))  Culture, blood (routine x 2)     Status: None (Preliminary result)   Collection Time: 04/26/19  3:05 PM   Specimen: BLOOD RIGHT HAND  Result Value Ref Range Status   Specimen Description   Final    BLOOD RIGHT HAND Performed at [place] [address], [place], [place] [address]    Special Requests   Final    BOTTLES DRAWN AEROBIC AND ANAEROBIC Blood Culture adequate volume Performed at [place] [address] [place], [place] [address]    Culture  Setup Time   Final    GRAM NEGATIVE RODS ANAEROBIC BOTTLE ONLY CRITICAL RESULT CALLED TO, READ BACK BY AND VERIFIED WITH: C. REED, RN (MCHP) AT [phone] ON 04/27/19 BY C. JESSUP, MT.    Culture   Final    CULTURE REINCUBATED FOR BETTER GROWTH Performed at [facility], [place] [address], [place], [place] [address]    Report Status PENDING  Incomplete  Culture, blood (routine x 2)     Status: None (Preliminary result)   Collection Time: 04/26/19  3:05 PM   Specimen: BLOOD  Result  Value Ref Range Status   Specimen Description   Final    BLOOD RIGHT ANTECUBITAL Performed at [place] [address] [place], [place] [address]    Special Requests   Final    BOTTLES DRAWN AEROBIC AND ANAEROBIC Blood Culture adequate volume Performed at [facility] [place] [address] [place], [place] [address]    Culture   Final    NO GROWTH < 24 HOURS Performed at [place] [address] [place], [place] [address]    Report Status PENDING  Incomplete  Urine culture     Status: None (Preliminary result)   Collection Time: 04/26/19  3:05 PM   Specimen: In/Out Cath Urine  Result Value Ref Range Status   Specimen Description   Final    IN/OUT CATH URINE Performed at [place] [address], [place], [place] [address]    Special Requests   Final    NONE Performed at [facility], [place] [address] [place], [place] [address]    Culture   Final    CULTURE REINCUBATED FOR BETTER GROWTH Performed at [facility], [place] [address] [place], [place] [address]    Report Status PENDING  Incomplete  Blood Culture ID Panel (Reflexed)     Status: Abnormal   Collection Time: 04/26/19  3:05 PM  Result Value Ref Range Status   Enterococcus species NOT DETECTED NOT DETECTED Final   Listeria monocytogenes NOT DETECTED NOT DETECTED Final   Staphylococcus species NOT DETECTED NOT DETECTED Final  Staphylococcus aureus (BCID) NOT DETECTED NOT DETECTED Final   Streptococcus species NOT DETECTED NOT DETECTED Final   Streptococcus agalactiae NOT DETECTED NOT DETECTED Final   Streptococcus pneumoniae NOT DETECTED NOT DETECTED Final   Streptococcus pyogenes NOT DETECTED NOT DETECTED Final   Acinetobacter baumannii NOT DETECTED NOT DETECTED Final   Enterobacteriaceae species DETECTED (A) NOT DETECTED Final    Comment: Enterobacteriaceae represent a large family of gram-negative bacteria, not a single  organism. CRITICAL RESULT CALLED TO, READ BACK BY AND VERIFIED WITH: C. REED, RN (MCHP) AT [phone] ON 04/27/19 BY C. JESSUP, MT.    Enterobacter cloacae complex NOT DETECTED NOT DETECTED Final   Escherichia coli DETECTED (A) NOT DETECTED Final    Comment: CRITICAL RESULT CALLED TO, READ BACK BY AND VERIFIED WITH: C. REED, RN (MCHP) AT 9937 ON 04/27/19 BY C. JESSUP, MT.    Klebsiella oxytoca NOT DETECTED NOT DETECTED Final   Klebsiella pneumoniae NOT DETECTED NOT DETECTED Final   Proteus species NOT DETECTED NOT DETECTED Final   Serratia marcescens NOT DETECTED NOT DETECTED Final   Carbapenem resistance NOT DETECTED NOT DETECTED Final   Haemophilus influenzae NOT DETECTED NOT DETECTED Final   Neisseria meningitidis NOT DETECTED NOT DETECTED Final   Pseudomonas aeruginosa NOT DETECTED NOT DETECTED Final   Candida albicans NOT DETECTED NOT DETECTED Final   Candida glabrata NOT DETECTED NOT DETECTED Final   Candida krusei NOT DETECTED NOT DETECTED Final   Candida parapsilosis NOT DETECTED NOT DETECTED Final   Candida tropicalis NOT DETECTED NOT DETECTED Final    Comment: Performed at [facility], [place] [address], [place], [place] [address]  SARS Coronavirus 2 by RT PCR (hospital order, performed in [facility] hospital lab) Nasopharyngeal Nasopharyngeal Swab     Status: None   Collection Time: 04/26/19  5:20 PM   Specimen: Nasopharyngeal Swab  Result Value Ref Range Status   SARS Coronavirus 2 NEGATIVE NEGATIVE Final    Comment: (NOTE) If result is NEGATIVE SARS-CoV-2 target nucleic acids are NOT DETECTED. The SARS-CoV-2 RNA is generally detectable in upper and lower  respiratory specimens during the acute phase of infection. The lowest  concentration of SARS-CoV-2 viral copies this assay can detect is 250  copies / mL. A negative result does not preclude SARS-CoV-2 infection  and should not be used as the sole basis for treatment or other  patient management decisions.  A negative  result may occur with  improper specimen collection / handling, submission of specimen other  than nasopharyngeal swab, presence of viral mutation(s) within the  areas targeted by this assay, and inadequate number of viral copies  (<250 copies / mL). A negative result must be combined with clinical  observations, patient history, and epidemiological information. If result is POSITIVE SARS-CoV-2 target nucleic acids are DETECTED. The SARS-CoV-2 RNA is generally detectable in upper and lower  respiratory specimens dur ing the acute phase of infection.  Positive  results are indicative of active infection with SARS-CoV-2.  Clinical  correlation with patient history and other diagnostic information is  necessary to determine patient infection status.  Positive results do  not rule out bacterial infection or co-infection with other viruses. If result is PRESUMPTIVE POSTIVE SARS-CoV-2 nucleic acids MAY BE PRESENT.   A presumptive positive result was obtained on the submitted specimen  and confirmed on repeat testing.  While 2019 novel coronavirus  (SARS-CoV-2) nucleic acids may be present in the submitted sample  additional confirmatory testing may be necessary for  epidemiological  and / or clinical management purposes  to differentiate between  SARS-CoV-2 and other Sarbecovirus currently known to infect humans.  If clinically indicated additional testing with an alternate test  methodology [phone]) is advised. The SARS-CoV-2 RNA is generally  detectable in upper and lower respiratory sp ecimens during the acute  phase of infection. The expected result is Negative. Fact Sheet for Patients:  StrictlyIdeas.no Fact Sheet for Healthcare Providers: BankingDealers.co.za This test is not yet approved or cleared by the [place] FDA and has been authorized for detection and/or diagnosis of SARS-CoV-2 by FDA under an Emergency Use Authorization  (EUA).  This EUA will remain in effect (meaning this test can be used) for the duration of the COVID-19 declaration under Section 564(b)(1) of the Act, 21 U.S.C. section 360bbb-3(b)(1), unless the authorization is terminated or revoked sooner. Performed at [facility], [place] [address] [place], [place] [address]     Radiographs and labs were personally reviewed by me.   Bobby Rumpf, MD [facility] [phone] 04/27/2019, 4:00 PM

## 2019-04-27 NOTE — Progress Notes (Signed)
Patient name: Rodney Sullivan MRN: [id number] DOB: 03-19-1941 Sex: male  REASON FOR VISIT: F/U after L BKA debridement  HPI: Rodney Sullivan is a 78 y.o. male that presents for wound check after left BKA debridement on 04/09/19.  He had a left BKA on 03/03/2019 for critical limb ischemia with tissue loss and had already undergone revascularization with left PT atherectomy.  Reports that this was healing accordingly until he fell after discharge.   Stump was debrided in the OR and has been in vac dressing with 3x weekly changes.  Feels the wound is healing.   Past Medical History:  Diagnosis Date  . Arthritis   . Bacteremia due to Escherichia coli 11/2013  . Cancer (HCC)    basal skin- arm, arm squamous  . Crohn disease ([place])   . Diabetes mellitus without complication ([place])   . DM (diabetes mellitus), type 2 with peripheral vascular complications ([place]) 4/94/4967  . DVT (deep venous thrombosis) ([place]) 11/27/13  . Dyspnea    at times  . History of kidney stones    passed  . History of skin cancer    ARMS & FACE  . Peripheral arterial disease ([place]) 02/15/2019  . Pulmonary embolism ([place]) 11/27/13   . Shoulder problem    left torn ligament-     Past Surgical History:  Procedure Laterality Date  . ABDOMINAL AORTOGRAM N/A 12/14/2018   Procedure: ABDOMINAL AORTOGRAM;  Surgeon: Waynetta Sandy, MD;  Location: [place] [facility];  Service: Cardiovascular;  Laterality: N/A;  . ABDOMINAL AORTOGRAM W/LOWER EXTREMITY Bilateral 02/17/2019   Procedure: ABDOMINAL AORTOGRAM W/LOWER EXTREMITY;  Surgeon: [name], MD;  Location: [place] [facility];  Service: Cardiovascular;  Laterality: Bilateral;  . ABDOMINAL SURGERY     Bowel resection x2  . AMPUTATION Right 12/21/2018   Procedure: RIGHT LONG FINGER AMPUTATION;  Surgeon: Leanora Cover, MD;  Location: [facility];  Service: Orthopedics;  Laterality: Right;  . AMPUTATION Left 03/03/2019   Procedure: AMPUTATION BELOW KNEE;   Surgeon: [name], MD;  Location: [place];  Service: Vascular;  Laterality: Left;  . APPLICATION OF WOUND VAC Left 04/09/2019   Procedure: APPLICATION OF WOUND VAC;  Surgeon: [name], MD;  Location: [facility];  Service: Vascular;  Laterality: Left;  . CHOLECYSTECTOMY    . COLON SURGERY    . HIP SURGERY Bilateral    replacement  . JOINT REPLACEMENT    . LOWER EXTREMITY ANGIOGRAPHY Right 12/14/2018   Procedure: LOWER EXTREMITY ANGIOGRAPHY;  Surgeon: Waynetta Sandy, MD;  Location: [place] [facility];  Service: Cardiovascular;  Laterality: Right;  . PERIPHERAL VASCULAR ATHERECTOMY  02/17/2019   Procedure: PERIPHERAL VASCULAR ATHERECTOMY;  Surgeon: [name], MD;  Location: [place] [facility];  Service: Cardiovascular;;  . UPPER EXTREMITY ANGIOGRAPHY Right 12/14/2018   Procedure: Right UPPER EXTREMITY ANGIOGRAPHY;  Surgeon: Waynetta Sandy, MD;  Location: [place] [facility];  Service: Cardiovascular;  Laterality: Right;  . WOUND DEBRIDEMENT Left 04/09/2019   Procedure: DEBRIDEMENT WOUND LEFT BELOW KNEE STUMP;  Surgeon: [name], MD;  Location: [facility] OR;  Service: Vascular;  Laterality: Left;    Family History  Problem Relation Age of Onset  . Hypertension Mother     SOCIAL HISTORY: Social History   Tobacco Use  . Smoking status: Former Smoker    Years: 10.00    Types: Sullivan  . Smokeless tobacco: Former Systems developer    Quit date: 12/08/1979  Substance Use Topics  .  Alcohol use: No    Alcohol/week: 0.0 standard drinks    Allergies  Allergen Reactions  . Ciprofloxacin Other (See Comments)    tendons hurt  . Shrimp [Shellfish Allergy]     Current Outpatient Medications  Medication Sig Dispense Refill  . acetaminophen (TYLENOL) 500 MG tablet Take 500 mg by mouth every 6 (six) hours as needed.    Marland Kitchen albuterol (VENTOLIN HFA) 108 (90 Base) MCG/ACT inhaler Inhale 2 puffs into the lungs every 4 (four) hours as needed for wheezing or shortness  of breath.     . allopurinol (ZYLOPRIM) 100 MG tablet Take 100 mg by mouth daily.    Marland Kitchen amLODipine (NORVASC) 2.5 MG tablet Take 2.5 mg by mouth daily with supper.     Marland Kitchen aspirin EC 81 MG tablet Take 81 mg by mouth daily with supper.     . benzonatate (TESSALON) 100 MG capsule Take 100 mg by mouth 3 (three) times daily as needed for cough.    . brimonidine-timolol (COMBIGAN) 0.2-0.5 % ophthalmic solution Place 1 drop into both eyes 2 (two) times a day.    . Certolizumab Pegol (CIMZIA) 2 X 200 MG KIT Inject 400 mg into the skin every 28 (twenty-eight) days.    . Cholecalciferol (VITAMIN D) 50 MCG (2000 UT) tablet Take 2,000 Units by mouth daily.    . Cyanocobalamin (B-12) 2500 MCG TABS Take 2,500 mcg by mouth daily.    Marland Kitchen Dexamethasone Acetate 8 MG/ML SUSP Inject 8 mg into the muscle daily as needed (for Crohn's flare).    . Emollient (ZIMS CRACK CREME) CREA Apply 1 application topically daily as needed (dry skin).     . famciclovir (FAMVIR) 250 MG tablet Take 250 mg by mouth 3 (three) times daily as needed (fungus on corner of mouth flare).     . ferrous sulfate 325 (65 FE) MG tablet Take 325 mg by mouth daily.    Marland Kitchen latanoprost (XALATAN) 0.005 % ophthalmic solution Place 1 drop into both eyes at bedtime.    . Multiple Vitamin (MULTIVITAMIN WITH MINERALS) TABS Take 1 tablet by mouth daily.    . Omega-3 Fatty Acids (FISH OIL) 1000 MG CAPS Take 1,000 mg by mouth daily.    Marland Kitchen OVER THE COUNTER MEDICATION Take 5 mLs by mouth every evening. CBD oil drops     . pantoprazole (PROTONIX) 40 MG tablet Take 40 mg by mouth daily at 3 pm. In the afternoon    . pioglitazone (ACTOS) 15 MG tablet Take 15 mg by mouth daily.     . pravastatin (PRAVACHOL) 10 MG tablet Take 1 tablet (10 mg total) by mouth every evening. (Patient taking differently: Take 10 mg by mouth daily at 3 pm. In the afternoon.) 30 tablet 0  . predniSONE (DELTASONE) 10 MG tablet Take 20 mg by mouth daily after breakfast. Total daily dose=22.5 mg  (take with 0.5 tablet 41m tablet)    . traMADol (ULTRAM) 50 MG tablet Take 1 tablet (50 mg total) by mouth at bedtime as needed for moderate pain. (Patient taking differently: Take 50 mg by mouth every 6 (six) hours as needed for moderate pain. )    . cephALEXin (KEFLEX) 500 MG capsule Take 1 capsule (500 mg total) by mouth 3 (three) times daily. (Patient not taking: Reported on 04/27/2019) 21 capsule 0  . oxyCODONE (OXY IR/ROXICODONE) 5 MG immediate release tablet Take 1 tablet (5 mg total) by mouth every 6 (six) hours as needed for severe pain. (Patient not  taking: Reported on 04/27/2019) 20 tablet 0   No current facility-administered medications for this visit.     REVIEW OF SYSTEMS:  [X]  denotes positive finding, [ ]  denotes negative finding Cardiac  Comments:  Chest pain or chest pressure:    Shortness of breath upon exertion:    Short of breath when lying flat:    Irregular heart rhythm:        Vascular    Pain in calf, thigh, or hip brought on by ambulation:    Pain in feet at night that wakes you up from your sleep:     Blood clot in your veins:    Leg swelling:         Pulmonary    Oxygen at home:    Productive cough:     Wheezing:         Neurologic    Sudden weakness in arms or legs:     Sudden numbness in arms or legs:     Sudden onset of difficulty speaking or slurred speech:    Temporary loss of vision in one eye:     Problems with dizziness:         Gastrointestinal    Blood in stool:     Vomited blood:         Genitourinary    Burning when urinating:     Blood in urine:        Psychiatric    Major depression:         Hematologic    Bleeding problems:    Problems with blood clotting too easily:        Skin    Rashes or ulcers:        Constitutional    Fever or chills:      PHYSICAL EXAM: Vitals:   04/27/19 0812  BP: 115/72  Pulse: 73  Resp: 18  Temp: (!) 96.5 F (35.8 C)  TempSrc: Temporal  SpO2: 100%  Weight: 160 lb (72.6 kg)     GENERAL: The patient is a well-nourished male, in no acute distress. The vital signs are documented above. CARDIAC: There is a regular rate and rhythm.  VASCULAR:  Left BKA as pictured below Nice granulation tissue forming in wound base, picture after debridement in clinic          DATA:   None  Assessment/Plan:  78 year old male status post left BKA on 04/09/19 for CLI with tissue loss that presented post-op after fall on his BKA.  He recently underwent debridement of the left BKA on 04/09/2019.  This wound appears to be granulating nicely as pictured above.  I did some local wound debridement in clinic which he tolerated.  Would recommend continued wound VAC with 3 times weekly changes.  I will see him again in 1 month for wound check.  Discussed that this will be a prolonged process getting the wound to heal with good wound care.  [name], MD [facility] Office: [phone] Pager: [phone]

## 2019-04-27 NOTE — Progress Notes (Signed)
PHARMACY - PHYSICIAN COMMUNICATION CRITICAL VALUE ALERT - BLOOD CULTURE IDENTIFICATION (BCID)  Rodney Sullivan is an 78 y.o. male who presented to [facility] on 04/27/2019 with a chief complaint of E Coli bacteremia  Assessment:  Unclear source Vs GI Vs GU vs left stump  Name of physician (or Provider) Contacted: Reesa Chew and Johnnye Sima via Secure chat  Current antibiotics: Ceftriaxone 2 gm IV q24  Changes to prescribed antibiotics recommended:  Patient is on recommended antibiotics - No changes needed  Results for orders placed or performed during the hospital encounter of 04/26/19  Blood Culture ID Panel (Reflexed) (Collected: 04/26/2019  3:05 PM)  Result Value Ref Range   Enterococcus species NOT DETECTED NOT DETECTED   Listeria monocytogenes NOT DETECTED NOT DETECTED   Staphylococcus species NOT DETECTED NOT DETECTED   Staphylococcus aureus (BCID) NOT DETECTED NOT DETECTED   Streptococcus species NOT DETECTED NOT DETECTED   Streptococcus agalactiae NOT DETECTED NOT DETECTED   Streptococcus pneumoniae NOT DETECTED NOT DETECTED   Streptococcus pyogenes NOT DETECTED NOT DETECTED   Acinetobacter baumannii NOT DETECTED NOT DETECTED   Enterobacteriaceae species DETECTED (A) NOT DETECTED   Enterobacter cloacae complex NOT DETECTED NOT DETECTED   Escherichia coli DETECTED (A) NOT DETECTED   Klebsiella oxytoca NOT DETECTED NOT DETECTED   Klebsiella pneumoniae NOT DETECTED NOT DETECTED   Proteus species NOT DETECTED NOT DETECTED   Serratia marcescens NOT DETECTED NOT DETECTED   Carbapenem resistance NOT DETECTED NOT DETECTED   Haemophilus influenzae NOT DETECTED NOT DETECTED   Neisseria meningitidis NOT DETECTED NOT DETECTED   Pseudomonas aeruginosa NOT DETECTED NOT DETECTED   Candida albicans NOT DETECTED NOT DETECTED   Candida glabrata NOT DETECTED NOT DETECTED   Candida krusei NOT DETECTED NOT DETECTED   Candida parapsilosis NOT DETECTED NOT DETECTED   Candida tropicalis NOT DETECTED  NOT DETECTED    Eudelia Bunch, Pharm.D [phone] 04/27/2019 6:01 PM

## 2019-04-27 NOTE — ED Notes (Signed)
Transport contacted to take patient to floor.

## 2019-04-28 DIAGNOSIS — Z89612 Acquired absence of left leg above knee: Secondary | ICD-10-CM

## 2019-04-28 DIAGNOSIS — K50913 Crohn's disease, unspecified, with fistula: Secondary | ICD-10-CM

## 2019-04-28 LAB — COMPREHENSIVE METABOLIC PANEL
ALT: 20 U/L (ref 0–44)
AST: 21 U/L (ref 15–41)
Albumin: 2.3 g/dL — ABNORMAL LOW (ref 3.5–5.0)
Alkaline Phosphatase: 38 U/L (ref 38–126)
Anion gap: 9 (ref 5–15)
BUN: 15 mg/dL (ref 8–23)
CO2: 20 mmol/L — ABNORMAL LOW (ref 22–32)
Calcium: 7.9 mg/dL — ABNORMAL LOW (ref 8.9–10.3)
Chloride: 112 mmol/L — ABNORMAL HIGH (ref 98–111)
Creatinine, Ser: 0.7 mg/dL (ref 0.61–1.24)
GFR calc Af Amer: >60 mL/min (ref >60)
GFR calc non Af Amer: >60 mL/min (ref >60)
Glucose, Bld: 102 mg/dL — ABNORMAL HIGH (ref 70–99)
Potassium: 3.2 mmol/L — ABNORMAL LOW (ref 3.5–5.1)
Sodium: 141 mmol/L (ref 135–145)
Total Bilirubin: 0.3 mg/dL (ref 0.3–1.2)
Total Protein: 4.6 g/dL — ABNORMAL LOW (ref 6.5–8.1)

## 2019-04-28 LAB — GLUCOSE, CAPILLARY
Glucose-Capillary: 91 mg/dL (ref 70–99)
Glucose-Capillary: 146 mg/dL — ABNORMAL HIGH (ref 70–99)
Glucose-Capillary: 169 mg/dL — ABNORMAL HIGH (ref 70–99)
Glucose-Capillary: 148 mg/dL — ABNORMAL HIGH (ref 70–99)

## 2019-04-28 LAB — URINE CULTURE
Culture: 1000 — AB
Culture: <10000 — AB

## 2019-04-28 LAB — CBC
HCT: 29.8 % — ABNORMAL LOW (ref 39.0–52.0)
Hemoglobin: 8.9 g/dL — ABNORMAL LOW (ref 13.0–17.0)
MCH: 26.3 pg (ref 26.0–34.0)
MCHC: 29.9 g/dL — ABNORMAL LOW (ref 30.0–36.0)
MCV: 88.2 fL (ref 80.0–100.0)
Platelets: 237 10*3/uL (ref 150–400)
RBC: 3.38 MIL/uL — ABNORMAL LOW (ref 4.22–5.81)
RDW: 14.9 % (ref 11.5–15.5)
WBC: 10.4 10*3/uL (ref 4.0–10.5)
nRBC: 0.2 % (ref 0.0–0.2)

## 2019-04-28 LAB — LACTIC ACID, PLASMA: Lactic Acid, Venous: 1.7 mmol/L (ref 0.5–1.9)

## 2019-04-28 LAB — MAGNESIUM: Magnesium: 1.7 mg/dL (ref 1.7–2.4)

## 2019-04-28 MED ORDER — POTASSIUM CHLORIDE CRYS ER 20 MEQ PO TBCR
40.0000 meq | EXTENDED_RELEASE_TABLET | ORAL | Status: AC
  Administered 2019-04-28 (×2): 40 meq via ORAL
  Filled 2019-04-28: qty 2

## 2019-04-28 MED ORDER — SODIUM CHLORIDE 0.9 % IV BOLUS
500.0000 mL | Freq: Once | INTRAVENOUS | Status: AC
  Administered 2019-04-28: 500 mL via INTRAVENOUS

## 2019-04-28 MED ORDER — POTASSIUM CHLORIDE CRYS ER 20 MEQ PO TBCR: 40.0000 meq | EXTENDED_RELEASE_TABLET | Freq: Every day | ORAL | Status: DC

## 2019-04-28 MED ORDER — MAGNESIUM SULFATE 2 GM/50ML IV SOLN
2.0000 g | Freq: Once | INTRAVENOUS | Status: AC
  Administered 2019-04-28: 2 g via INTRAVENOUS
  Filled 2019-04-28: qty 50

## 2019-04-28 MED ORDER — POTASSIUM CHLORIDE CRYS ER 20 MEQ PO TBCR
40.0000 meq | EXTENDED_RELEASE_TABLET | Freq: Once | ORAL | Status: DC
  Filled 2019-04-28: qty 2

## 2019-04-28 NOTE — Progress Notes (Signed)
[facility] Progress Note  Patient: Rodney Sullivan ZES:[id number]   PCP: Egbert Garibaldi, PA-C DOB: Mar 25, 1941   DOA: 04/27/2019   DOS: 04/28/2019   Date of Service: the patient was seen and examined on 04/28/2019  No chief complaint on file. Abnormal lab  Brief hospital course: Rodney Sullivan is a 78 y.o. male with medical history significant of with history of Crohn's disease, history of PE/DVT, diabetes mellitus type 2, essential hypertension, peripheral vascular disease was asked to come back to the ER for evaluation of positive blood cultures.  Initially patient presented to the hospital yesterday with generalized weakness, confusion and abdominal discomfort.  Work-up showed elevated WBC and lactic acidosis.  CT of the abdomen pelvis was negative, he was given 1 dose of IV cefepime and discharged home.  Blood cultures were drawn prior to discharge.  Today he received a call that his blood cultures are positive therefore advised to come back to the ER. He was seen by vascular this morning secondary to recent left foot amputation and was told it is healing well and recommended routine wound VAC changes.  In the ER today patient was noted to have leukocytosis and lactic acidosis.  Blood cultures were positive for E. coli and Enterobacter.  He was given dose of cefepime.  Seen by infectious disease.  Subjective: Patient does not have any fever or chills which she had a few days ago which brought him to the ER.  Denies any headache dizziness nausea vomiting chest pain abdominal pain.  No change in stool consistency or blood in the stool.  No abdominal pain.  Assessment and Plan: Bacteremia secondary to E. coli/Enterobacter Sepsis secondary to gram-negative rods -Unknown clear source GI versus GU versus left stump -Follow-up culture sensitivities.  Continue IV cefepime -MRI left stump offered- patient and wife does not want this to be done as they were assured by Dr Carlis Abbott there is no  underlying infection and its healing well. I have explained MRI is to be thorough but still would not want one at this time.  -IV fluids.  Supportive care.  Trend lactic acid -UA-negative -CT abdomen pelvis done 10/19-stable probable scarring extending between proximal duodenum, ileum and colon.  Fistula cannot be excluded.  Peripheral vascular disease status post recent left-sided BKA 04/09/2019 -Seen by vascular surgery, granulating nicely.  Recommend wound VAC change  3 times weekly -Consult wound care for wound VAC placement tomorrow morning.  If they are not able to, we can let vascular surgery know.  Crohn's disease -This is managed by gastroenterologist at [facility].  Continue prednisone with caution especially the setting of active infection. Discussed with [facility] gastroenterology who felt that based on the evidence of no acute inflammation on the CT scan as well as no symptoms from patient, there is no indication for further work-up or treatment changes for patient's Crohn's disease.  Diabetes mellitus type 2, insulin-dependent -Insulin sliding scale and Accu-Chek.  Essential hypertension -Holding off on Norvasc.  Stabilize blood pressure and slowly resume as necessary.  Diet: Cardiac diet  DVT Prophylaxis: Subcutaneous Lovenox  Advance goals of care discussion: Full code  Family Communication: family was present at bedside, at the time of interview. The pt provided permission to discuss medical plan with the family. Opportunity was given to ask question and all questions were answered satisfactorily.   Disposition:  Discharge to home once blood cultures are negative with home health.  Consultants: ID, phone discussion with GI Procedures: None  Scheduled Meds: .  allopurinol  100 mg Oral Daily  . aspirin EC  81 mg Oral Q supper  . cholecalciferol  2,000 Units Oral Daily  . enoxaparin (LOVENOX) injection  40 mg Subcutaneous Q24H  . ferrous sulfate  325 mg  Oral Daily  . insulin aspart  0-15 Units Subcutaneous TID WC  . insulin aspart  0-5 Units Subcutaneous QHS  . insulin aspart  3 Units Subcutaneous TID WC  . multivitamin with minerals  1 tablet Oral Daily  . pantoprazole  40 mg Oral Q supper  . pravastatin  10 mg Oral Q supper  . predniSONE  20 mg Oral QPC breakfast  . vitamin B-12  2,500 mcg Oral Daily   Continuous Infusions: . cefTRIAXone (ROCEPHIN)  IV 2 g (04/28/19 0952)   PRN Meds: acetaminophen **OR** acetaminophen, albuterol, hydrALAZINE, oxyCODONE, polyethylene glycol, senna-docusate, traMADol Antibiotics: Anti-infectives (From admission, onward)   Start     Dose/Rate Route Frequency Ordered Stop   04/28/19 1000  cefTRIAXone (ROCEPHIN) 2 g in sodium chloride 0.9 % 100 mL IVPB     2 g 200 mL/hr over 30 Minutes Intravenous Every 24 hours 04/27/19 1759     04/28/19 0000  cefTRIAXone (ROCEPHIN) 2 g in sodium chloride 0.9 % 100 mL IVPB  Status:  Discontinued     2 g 200 mL/hr over 30 Minutes Intravenous Every 24 hours 04/27/19 1527 04/27/19 1643   04/27/19 1400  meropenem (MERREM) 1 g in sodium chloride 0.9 % 100 mL IVPB  Status:  Discontinued     1 g 200 mL/hr over 30 Minutes Intravenous Every 8 hours 04/27/19 1313 04/27/19 1328   04/27/19 1400  cefTRIAXone (ROCEPHIN) 2 g in sodium chloride 0.9 % 100 mL IVPB     2 g 200 mL/hr over 30 Minutes Intravenous  Once 04/27/19 1352 04/27/19 1459   04/27/19 1330  ceFEPIme (MAXIPIME) 1 g in sodium chloride 0.9 % 100 mL IVPB  Status:  Discontinued     1 g 200 mL/hr over 30 Minutes Intravenous  Once 04/27/19 1329 04/27/19 1352       Objective: Physical Exam: Vitals:   04/27/19 2022 04/28/19 0500 04/28/19 0603 04/28/19 1406  BP: (!) 141/77  120/74 138/70  Pulse: 84  73 73  Resp: 18  16 19   Temp: 98.2 F (36.8 C)  97.8 F (36.6 C) (!) 97.4 F (36.3 C)  TempSrc: Oral  Oral Oral  SpO2: 100%  96% 100%  Weight:  75.4 kg    Height:        Intake/Output Summary (Last 24 hours) at  04/28/2019 1733 Last data filed at 04/28/2019 1500 Gross per 24 hour  Intake 3020.51 ml  Output 375 ml  Net 2645.51 ml   Filed Weights   04/28/19 0500  Weight: 75.4 kg   General: alert and oriented to time, place, and person. Appear in mild distress, affect appropriate Eyes: PERRL, Conjunctiva normal ENT: Oral Mucosa Clear, moist  Neck: no JVD, no Abnormal Mass Or lumps Cardiovascular: S1 and S2 Present, no Murmur, peripheral pulses symmetrical Respiratory: good respiratory effort, Bilateral Air entry equal and Decreased, no signs of accessory muscle use, Clear to Auscultation, no Crackles, no wheezes Abdomen: Bowel Sound present, Soft and no tenderness, no hernia Skin: no rashes  Extremities: no Pedal edema, left BKA, no calf tenderness Neurologic: without any new focal findings Gait not checked due to patient safety concerns  Data Reviewed: I have personally reviewed and interpreted daily labs, tele strips, imagings as discussed  above. I reviewed all nursing notes, pharmacy notes, vitals, pertinent old records I have discussed plan of care as described above with RN and patient/family.  CBC: Recent Labs  Lab 04/26/19 1437 04/27/19 1412 04/27/19 1715 04/28/19 0534  WBC 19.1* 17.8* 14.4* 10.4  NEUTROABS 17.1* 13.9*  --   --   HGB 12.3* 10.4* 9.6* 8.9*  HCT 41.5 35.6* 32.7* 29.8*  MCV 87.6 88.6 89.3 88.2  PLT 389 298 262 183   Basic Metabolic Panel: Recent Labs  Lab 04/26/19 1437 04/27/19 1412 04/27/19 1715 04/28/19 0534  NA 139 140  --  141  K 4.0 3.7  --  3.2*  CL 103 108  --  112*  CO2 22 21*  --  20*  GLUCOSE 253* 253*  --  102*  BUN 17 21  --  15  CREATININE 0.97 0.90 0.66 0.70  CALCIUM 9.2 8.9  --  7.9*  MG  --   --   --  1.7    Liver Function Tests: Recent Labs  Lab 04/26/19 1437 04/28/19 0534  AST 32 21  ALT 23 20  ALKPHOS 77 38  BILITOT 0.4 0.3  PROT 6.5 4.6*  ALBUMIN 3.4* 2.3*   Recent Labs  Lab 04/26/19 1437  LIPASE 37   No  results for input(s): AMMONIA in the last 168 hours. Coagulation Profile: Recent Labs  Lab 04/26/19 1505  INR 1.0   Cardiac Enzymes: No results for input(s): CKTOTAL, CKMB, CKMBINDEX, TROPONINI in the last 168 hours. BNP (last 3 results) No results for input(s): PROBNP in the last 8760 hours. CBG: Recent Labs  Lab 04/27/19 2028 04/28/19 0729 04/28/19 1132 04/28/19 1627  GLUCAP 288* 91 146* 169*   Studies: No results found.   Time spent: 35 minutes  Author: Berle Mull, MD [facility] 04/28/2019 5:33 PM  To reach On-call, see care teams to locate the attending and reach out to them via [email or web address]. If 7PM-7AM, please contact night-coverage If you still have difficulty reaching the attending provider, please page the [facility] (Director on Call) for [facility] on amion for assistance.

## 2019-04-28 NOTE — Consult Note (Signed)
[place] Nurse wound consult note Patient receiving care in [place] 1520.  Patient's wife stated the "vascular surgeon" told them their home VAC could be used in the hospital and they are wanting the [facility] therapy connected to the home VAC unit.  I explained that the device was for home use, and the patient is in hospital and we really should be using a hospital unit. Reason for Consult: VAC therapy to LLE stump Wound type: surgical Measurement: 3 cm x 4.3 cm x 1.2 cm Wound bed: 90% pink, 10% yellow, small section of bone exposed.  Covered the bone by a small piece of meptiel Drainage (amount, consistency, odor) none Periwound: intact Dressing procedure/placement/frequency: one piece of black foam placed into wound bed. Immediate seal at 125 mmHG obtained with home VAC.  Patient tolerated well.  Val Riles, RN, MSN, CWOCN, CNS-BC, pager [phone]

## 2019-04-28 NOTE — Progress Notes (Signed)
Contacted [employer] rep, Tommi Rumps to make aware of admission. He is active with HHRN, PT,OT and aide.[place], [place] ED TOC CM [phone]

## 2019-04-28 NOTE — Progress Notes (Signed)
INFECTIOUS DISEASE PROGRESS NOTE  ID: Rodney Sullivan is a 78 y.o. male with  Principal Problem:   Bacteremia due to Escherichia coli Active Problems:   Crohn disease ([place])   Sepsis ([place])   Peripheral arterial disease ([place])   DM (diabetes mellitus), type 2 with peripheral vascular complications (HCC)  Subjective: No complaints.   Abtx:  Anti-infectives (From admission, onward)   Start     Dose/Rate Route Frequency Ordered Stop   04/28/19 1000  cefTRIAXone (ROCEPHIN) 2 g in sodium chloride 0.9 % 100 mL IVPB     2 g 200 mL/hr over 30 Minutes Intravenous Every 24 hours 04/27/19 1759     04/28/19 0000  cefTRIAXone (ROCEPHIN) 2 g in sodium chloride 0.9 % 100 mL IVPB  Status:  Discontinued     2 g 200 mL/hr over 30 Minutes Intravenous Every 24 hours 04/27/19 1527 04/27/19 1643   04/27/19 1400  meropenem (MERREM) 1 g in sodium chloride 0.9 % 100 mL IVPB  Status:  Discontinued     1 g 200 mL/hr over 30 Minutes Intravenous Every 8 hours 04/27/19 1313 04/27/19 1328   04/27/19 1400  cefTRIAXone (ROCEPHIN) 2 g in sodium chloride 0.9 % 100 mL IVPB     2 g 200 mL/hr over 30 Minutes Intravenous  Once 04/27/19 1352 04/27/19 1459   04/27/19 1330  ceFEPIme (MAXIPIME) 1 g in sodium chloride 0.9 % 100 mL IVPB  Status:  Discontinued     1 g 200 mL/hr over 30 Minutes Intravenous  Once 04/27/19 1329 04/27/19 1352      Medications:  Scheduled: . allopurinol  100 mg Oral Daily  . aspirin EC  81 mg Oral Q supper  . cholecalciferol  2,000 Units Oral Daily  . enoxaparin (LOVENOX) injection  40 mg Subcutaneous Q24H  . ferrous sulfate  325 mg Oral Daily  . insulin aspart  0-15 Units Subcutaneous TID WC  . insulin aspart  0-5 Units Subcutaneous QHS  . insulin aspart  3 Units Subcutaneous TID WC  . multivitamin with minerals  1 tablet Oral Daily  . pantoprazole  40 mg Oral Q supper  . pravastatin  10 mg Oral Q supper  . predniSONE  20 mg Oral QPC breakfast  . vitamin B-12  2,500 mcg Oral Daily    Objective: Vital signs in last 24 hours: Temp:  [97.8 F (36.6 C)-98.2 F (36.8 C)] 97.8 F (36.6 C) (10/21 0603) Pulse Rate:  [70-84] 73 (10/21 0603) Resp:  [15-20] 16 (10/21 0603) BP: (95-141)/(55-77) 120/74 (10/21 0603) SpO2:  [95 %-100 %] 96 % (10/21 0603) Weight:  [75.4 kg] 75.4 kg (10/21 0500)   General appearance: alert, cooperative and no distress Resp: clear to auscultation bilaterally Cardio: regular rate and rhythm GI: normal findings: bowel sounds normal and soft, non-tender Extremities: L stump dressed.   Lab Results Recent Labs    04/27/19 1412 04/27/19 1715 04/28/19 0534  WBC 17.8* 14.4* 10.4  HGB 10.4* 9.6* 8.9*  HCT 35.6* 32.7* 29.8*  NA 140  --  141  K 3.7  --  3.2*  CL 108  --  112*  CO2 21*  --  20*  BUN 21  --  15  CREATININE 0.90 0.66 0.70   Liver Panel Recent Labs    04/26/19 1437 04/28/19 0534  PROT 6.5 4.6*  ALBUMIN 3.4* 2.3*  AST 32 21  ALT 23 20  ALKPHOS 77 38  BILITOT 0.4 0.3   Sedimentation Rate No results  for input(s): ESRSEDRATE in the last 72 hours. C-Reactive Protein No results for input(s): CRP in the last 72 hours.  Microbiology: Recent Results (from the past 240 hour(s))  Culture, blood (routine x 2)     Status: Abnormal (Preliminary result)   Collection Time: 04/26/19  3:05 PM   Specimen: BLOOD RIGHT HAND  Result Value Ref Range Status   Specimen Description   Final    BLOOD RIGHT HAND Performed at [facility] [address] [place], [place] [address]    Special Requests   Final    BOTTLES DRAWN AEROBIC AND ANAEROBIC Blood Culture adequate volume Performed at [facility] [address] [place], [place] [address]    Culture  Setup Time   Final    GRAM NEGATIVE RODS ANAEROBIC BOTTLE ONLY CRITICAL RESULT CALLED TO, READ BACK BY AND VERIFIED WITH: C. REED, RN (MCHP) AT 4403 ON 04/27/19 BY C. JESSUP, MT.    Culture (A)  Final    ESCHERICHIA COLI SUSCEPTIBILITIES TO  FOLLOW Performed at [facility], [place] [address], [place], [place] [address]    Report Status PENDING  Incomplete  Culture, blood (routine x 2)     Status: None (Preliminary result)   Collection Time: 04/26/19  3:05 PM   Specimen: BLOOD  Result Value Ref Range Status   Specimen Description   Final    BLOOD RIGHT ANTECUBITAL Performed at [facility] [address], [place], [place] [address]    Special Requests   Final    BOTTLES DRAWN AEROBIC AND ANAEROBIC Blood Culture adequate volume Performed at [facility] [address] [place], [place] [address]    Culture   Final    NO GROWTH 2 DAYS Performed at [facility] [address], [place],  [address]    Report Status PENDING  Incomplete  Urine culture     Status: Abnormal   Collection Time: 04/26/19  3:05 PM   Specimen: In/Out Cath Urine  Result Value Ref Range Status   Specimen Description   Final    IN/OUT CATH URINE Performed at [facility] [address] [place],  [address]    Special Requests   Final    NONE Performed at [facility], [place] [address], [place], [place] [address]    Culture 1,000 COLONIES/mL ENTEROCOCCUS FAECALIS (A)  Final   Report Status 04/28/2019 FINAL  Final   Organism ID, Bacteria ENTEROCOCCUS FAECALIS (A)  Final      Susceptibility   Enterococcus faecalis - MIC*    AMPICILLIN 4 SENSITIVE Sensitive     LEVOFLOXACIN 4 INTERMEDIATE Intermediate     NITROFURANTOIN <=16 SENSITIVE Sensitive     VANCOMYCIN 1 SENSITIVE Sensitive     * 1,000 COLONIES/mL ENTEROCOCCUS FAECALIS  Blood Culture ID Panel (Reflexed)     Status: Abnormal   Collection Time: 04/26/19  3:05 PM  Result Value Ref Range Status   Enterococcus species NOT DETECTED NOT DETECTED Final   Listeria monocytogenes NOT DETECTED NOT DETECTED Final   Staphylococcus species NOT DETECTED NOT DETECTED Final   Staphylococcus aureus (BCID)  NOT DETECTED NOT DETECTED Final   Streptococcus species NOT DETECTED NOT DETECTED Final   Streptococcus agalactiae NOT DETECTED NOT DETECTED Final   Streptococcus pneumoniae NOT DETECTED NOT DETECTED Final   Streptococcus pyogenes NOT DETECTED NOT DETECTED Final   Acinetobacter baumannii NOT DETECTED NOT DETECTED Final   Enterobacteriaceae species  DETECTED (A) NOT DETECTED Final    Comment: Enterobacteriaceae represent a large family of gram-negative bacteria, not a single organism. CRITICAL RESULT CALLED TO, READ BACK BY AND VERIFIED WITH: C. REED, RN (MCHP) AT 9924 ON 04/27/19 BY C. JESSUP, MT.    Enterobacter cloacae complex NOT DETECTED NOT DETECTED Final   Escherichia coli DETECTED (A) NOT DETECTED Final    Comment: CRITICAL RESULT CALLED TO, READ BACK BY AND VERIFIED WITH: C. REED, RN (MCHP) AT 2683 ON 04/27/19 BY C. JESSUP, MT.    Klebsiella oxytoca NOT DETECTED NOT DETECTED Final   Klebsiella pneumoniae NOT DETECTED NOT DETECTED Final   Proteus species NOT DETECTED NOT DETECTED Final   Serratia marcescens NOT DETECTED NOT DETECTED Final   Carbapenem resistance NOT DETECTED NOT DETECTED Final   Haemophilus influenzae NOT DETECTED NOT DETECTED Final   Neisseria meningitidis NOT DETECTED NOT DETECTED Final   Pseudomonas aeruginosa NOT DETECTED NOT DETECTED Final   Candida albicans NOT DETECTED NOT DETECTED Final   Candida glabrata NOT DETECTED NOT DETECTED Final   Candida krusei NOT DETECTED NOT DETECTED Final   Candida parapsilosis NOT DETECTED NOT DETECTED Final   Candida tropicalis NOT DETECTED NOT DETECTED Final    Comment: Performed at [facility], [place] [address] [place], [place] [address]  SARS Coronavirus 2 by RT PCR (hospital order, performed in [facility] hospital lab) Nasopharyngeal Nasopharyngeal Swab     Status: None   Collection Time: 04/26/19  5:20 PM   Specimen: Nasopharyngeal Swab  Result Value Ref Range Status   SARS Coronavirus 2 NEGATIVE NEGATIVE  Final    Comment: (NOTE) If result is NEGATIVE SARS-CoV-2 target nucleic acids are NOT DETECTED. The SARS-CoV-2 RNA is generally detectable in upper and lower  respiratory specimens during the acute phase of infection. The lowest  concentration of SARS-CoV-2 viral copies this assay can detect is 250  copies / mL. A negative result does not preclude SARS-CoV-2 infection  and should not be used as the sole basis for treatment or other  patient management decisions.  A negative result may occur with  improper specimen collection / handling, submission of specimen other  than nasopharyngeal swab, presence of viral mutation(s) within the  areas targeted by this assay, and inadequate number of viral copies  (<250 copies / mL). A negative result must be combined with clinical  observations, patient history, and epidemiological information. If result is POSITIVE SARS-CoV-2 target nucleic acids are DETECTED. The SARS-CoV-2 RNA is generally detectable in upper and lower  respiratory specimens dur ing the acute phase of infection.  Positive  results are indicative of active infection with SARS-CoV-2.  Clinical  correlation with patient history and other diagnostic information is  necessary to determine patient infection status.  Positive results do  not rule out bacterial infection or co-infection with other viruses. If result is PRESUMPTIVE POSTIVE SARS-CoV-2 nucleic acids MAY BE PRESENT.   A presumptive positive result was obtained on the submitted specimen  and confirmed on repeat testing.  While 2019 novel coronavirus  (SARS-CoV-2) nucleic acids may be present in the submitted sample  additional confirmatory testing may be necessary for epidemiological  and / or clinical management purposes  to differentiate between  SARS-CoV-2 and other Sarbecovirus currently known to infect humans.  If clinically indicated additional testing with an alternate test  methodology [phone]) is advised. The  SARS-CoV-2 RNA is generally  detectable in upper and lower respiratory sp ecimens during the acute  phase of  infection. The expected result is Negative. Fact Sheet for Patients:  StrictlyIdeas.no Fact Sheet for Healthcare Providers: BankingDealers.co.za This test is not yet approved or cleared by the [place] FDA and has been authorized for detection and/or diagnosis of SARS-CoV-2 by FDA under an Emergency Use Authorization (EUA).  This EUA will remain in effect (meaning this test can be used) for the duration of the COVID-19 declaration under Section 564(b)(1) of the Act, 21 U.S.C. section 360bbb-3(b)(1), unless the authorization is terminated or revoked sooner. Performed at [facility], [place] [address] [place], [place] [address]   Culture, blood (routine x 2)     Status: None (Preliminary result)   Collection Time: 04/27/19  5:15 PM   Specimen: BLOOD RIGHT HAND  Result Value Ref Range Status   Specimen Description   Final    BLOOD RIGHT HAND Performed at [facility] [address] [place], [place] [address]    Special Requests   Final    BOTTLES DRAWN AEROBIC ONLY Blood Culture adequate volume Performed at [facility] [address] [place], [place] [address]    Culture   Final    NO GROWTH < 12 HOURS Performed at [facility] [address], [place], [place] [address]    Report Status PENDING  Incomplete    Studies/Results: Ct Abdomen Pelvis W Contrast  Result Date: 04/26/2019 CLINICAL DATA:  Acute generalized abdominal pain. EXAM: CT ABDOMEN AND PELVIS WITH CONTRAST TECHNIQUE: Multidetector CT imaging of the abdomen and pelvis was performed using the standard protocol following bolus administration of intravenous contrast. CONTRAST:  12m OMNIPAQUE IOHEXOL 300 MG/ML  SOLN COMPARISON:  December 08, 2014. FINDINGS: Lower chest: No acute abnormality.  Hepatobiliary: No focal liver abnormality is seen. Status post cholecystectomy. No biliary dilatation. Pancreas: Unremarkable. No pancreatic ductal dilatation or surrounding inflammatory changes. Spleen: Normal in size without focal abnormality. Adrenals/Urinary Tract: Adrenal glands appear normal. Stable bilateral renal cysts are noted. No hydronephrosis or renal obstruction is noted. No renal or1 ureteral calculi are noted. Small bladder diverticulum is noted, but otherwise urinary bladder is unremarkable, although partly obscured by scatter artifact arising from bilateral hip prostheses. Stomach/Bowel: The stomach is unremarkable. Status post partial colectomy. There remains probable scarring extending between the proximal duodenum, ileum and colon which is unchanged compared to prior exam. Fistulous connection cannot be excluded. No acute bowel inflammation or dilatation is noted. Vascular/Lymphatic: Aortic atherosclerosis. No enlarged abdominal or pelvic lymph nodes. Reproductive: Not well visualized due to scatter artifact arising from bilateral hip arthroplasties. Other: No abdominal wall hernia or abnormality. No abdominopelvic ascites. Musculoskeletal: Bilateral hip prosthesis as noted above. No acute abnormality is noted. IMPRESSION: Stable probable scarring is seen extending between the proximal duodenum, ileum and colon. Fistulous connection cannot be excluded. No definite evidence of acute bowel inflammation or obstruction is noted. Aortic Atherosclerosis (ICD10-I70.0). Electronically Signed   By: JMarijo ConceptionM.D.   On: 04/26/2019 17:59   Dg Chest Port 1 View  Result Date: 04/26/2019 CLINICAL DATA:  Shortness of breath. New onset upper abdominal pain. EXAM: PORTABLE CHEST 1 VIEW COMPARISON:  02/15/2019. Chest CTA dated 07/15/2017. FINDINGS: Stable enlarged cardiac silhouette. Stable elevated right hemidiaphragm with a probable large eventration. Clear lungs with normal vascularity. No pleural  fluid. Mild diffuse osteopenia. Thoracic and right shoulder degenerative changes with superior migration of the right humeral head, compatible with a large, chronic rotator cuff tear. IMPRESSION: 1. No acute abnormality. 2. Stable cardiomegaly.  Electronically Signed   By: Claudie Revering M.D.   On: 04/26/2019 13:50     Assessment/Plan: E coli bacteremia DM2 Crohn's  Total days of antibiotics: 1 ceftriaxone  Await sensi from BCx of E coli His UCx showed 1k Enterococcus, would not treat.  Query if bacteremia is from his Crohn's? Case d/i GI who did not feel he has active lesions. Pt notes he has (had?) multiple fistulas.  Would favor 1 week of ceftriaxone. If any difficulties at end of 1 week, continue po.  We spoke about prolonged po to prevent relapses, however I would not favor this due to risk of developing antibiotic resistance.  They have home health [facility]) I have let them know how to contact me.  Available as needed.          Bobby Rumpf MD, FACP Infectious Diseases (pager) [phone] [email or web address] 04/28/2019, 9:12 AM  LOS: 1 day

## 2019-04-28 NOTE — TOC Initial Note (Signed)
Transition of Care [facility]) - Initial/Assessment Note    Patient Details  Name: Rodney Sullivan MRN: [id number] Date of Birth: 1940-12-23  Transition of Care [facility]) CM/SW Contact:    Trish Mage, LCSW Phone Number: 04/28/2019, 3:33 PM  Clinical Narrative:  Rodney Sullivan is here for Bacteremia.  He is a recent AKA L leg, and is such is non ambulatory.  His wife is at bedside, and he gave permission to talk with her as well.  He is able to transfers himself with her spotting him, is able to use the bathroom and is currently getting sponge baths.  He also has BSC, but does not use it.  Already getting [place] services from [facility] for PT, OT, aide, RN.  Will resume with them upon d/c.  Georgina Snell alerted.                 Expected Discharge Plan: [place] Barriers to Discharge: Continued Medical Work up   Patient Goals and CMS Choice Patient states their goals for this hospitalization and ongoing recovery are:: "I am looking forward to getting my prosthesis some day.  In the meantime, I got this infection."      Expected Discharge Plan and Services Expected Discharge Plan: [place]   Discharge Planning Services: CM Consult   Living arrangements for the past 2 months: Single Family Home Expected Discharge Date: (unknown)                           HH Agency: [facility] Date [facility] Agency Contacted: 04/28/19 Time HH Agency Contacted: 41 Representative spoke with at [place]: Georgina Snell  Prior Living Arrangements/Services Living arrangements for the past 2 months: [place] Lives with:: Spouse Patient language and need for interpreter reviewed:: Yes Do you feel safe going back to the place where you live?: Yes      Need for Family Participation in Patient Care: Yes (Comment) Care giver support system in place?: Yes (comment) Current home services: DME, Home PT, Home RN, Homehealth aide, Home OT Criminal Activity/Legal Involvement  Pertinent to Current Situation/Hospitalization: No - Comment as needed  Activities of Daily Living Home Assistive Devices/Equipment: Wheelchair, Other (Comment), Wound Vac, Eyeglasses, Dentures (specify type), CBG Meter(upper/lower dentures) ADL Screening (condition at time of admission) Patient's cognitive ability adequate to safely complete daily activities?: Yes Is the patient deaf or have difficulty hearing?: No Does the patient have difficulty seeing, even when wearing glasses/contacts?: No Does the patient have difficulty concentrating, remembering, or making decisions?: No Patient able to express need for assistance with ADLs?: Yes Does the patient have difficulty dressing or bathing?: Yes Independently performs ADLs?: No Communication: Independent Grooming: Needs assistance Is this a change from baseline?: Pre-admission baseline Feeding: Needs assistance Is this a change from baseline?: Pre-admission baseline Bathing: Needs assistance Is this a change from baseline?: Pre-admission baseline Toileting: Needs assistance Is this a change from baseline?: Pre-admission baseline In/Out Bed: Needs assistance Is this a change from baseline?: Pre-admission baseline Walks in Home: Needs assistance Is this a change from baseline?: Pre-admission baseline Does the patient have difficulty walking or climbing stairs?: Yes(secondary to left bka) Weakness of Legs: Right(left bka) Weakness of Arms/Hands: None  Permission Sought/Granted Permission sought to share information with : Family Supports Permission granted to share information with : Yes, Verbal Permission Granted  Share Information with NAME: Rodney Sullivan     Permission granted to share  info w Relationship: wife     Emotional Assessment Appearance:: Appears stated age Attitude/Demeanor/Rapport: Engaged Affect (typically observed): Appropriate Orientation: : Oriented to Self, Oriented to Place, Oriented to  Time, Oriented to  Situation Alcohol / Substance Use: Not Applicable Psych Involvement: No (comment)  Admission diagnosis:  Bacteremia [R78.81] Severe comorbid illness [R69] Patient Active Problem List   Diagnosis Date Noted  . Bacteremia due to Escherichia coli 04/27/2019  . Gangrene of left foot ([place]) 03/01/2019  . Sepsis ([place]) 02/15/2019  . Anemia 02/15/2019  . Cellulitis and abscess of toe of left foot 02/15/2019  . Peripheral arterial disease ([place]) 02/15/2019  . DM (diabetes mellitus), type 2 with peripheral vascular complications ([place]) 14/48/1856  . Gout 02/15/2019  . Septic shock ([place]) 12/14/2013  . Pulmonary embolism ([place]) 12/14/2013  . Crohn disease ([place]) 12/14/2013   PCP:  Egbert Garibaldi, PA-C Pharmacy:   [facility], [place] [address] Phone: [phone] Fax: [phone]     Social Determinants of Health (SDOH) Interventions    Readmission Risk Interventions Readmission Risk Prevention Plan 03/18/2019  Transportation Screening Complete  PCP or Specialist Appt within 3-5 Days Complete  HRI or [place] Complete  Social Work Consult for [place] Planning/Counseling Complete  Palliative Care Screening Not Applicable  Medication Review Press photographer) Complete  Some recent data might be hidden

## 2019-04-29 ENCOUNTER — Telehealth: Payer: Self-pay

## 2019-04-29 ENCOUNTER — Inpatient Hospital Stay: Payer: Self-pay

## 2019-04-29 LAB — GLUCOSE, CAPILLARY
Glucose-Capillary: 111 mg/dL — ABNORMAL HIGH (ref 70–99)
Glucose-Capillary: 113 mg/dL — ABNORMAL HIGH (ref 70–99)

## 2019-04-29 LAB — COMPREHENSIVE METABOLIC PANEL
ALT: 21 U/L (ref 0–44)
AST: 21 U/L (ref 15–41)
Albumin: 2.7 g/dL — ABNORMAL LOW (ref 3.5–5.0)
Alkaline Phosphatase: 49 U/L (ref 38–126)
Anion gap: 10 (ref 5–15)
BUN: 12 mg/dL (ref 8–23)
CO2: 20 mmol/L — ABNORMAL LOW (ref 22–32)
Calcium: 8.6 mg/dL — ABNORMAL LOW (ref 8.9–10.3)
Chloride: 110 mmol/L (ref 98–111)
Creatinine, Ser: 0.83 mg/dL (ref 0.61–1.24)
GFR calc Af Amer: >60 mL/min (ref >60)
GFR calc non Af Amer: >60 mL/min (ref >60)
Glucose, Bld: 120 mg/dL — ABNORMAL HIGH (ref 70–99)
Potassium: 3.9 mmol/L (ref 3.5–5.1)
Sodium: 140 mmol/L (ref 135–145)
Total Bilirubin: 0.3 mg/dL (ref 0.3–1.2)
Total Protein: 5.2 g/dL — ABNORMAL LOW (ref 6.5–8.1)

## 2019-04-29 LAB — CBC WITH DIFFERENTIAL/PLATELET
Abs Immature Granulocytes: 0.1 10*3/uL — ABNORMAL HIGH (ref 0.00–0.07)
Basophils Absolute: 0 10*3/uL (ref 0.0–0.1)
Basophils Relative: 0 %
Eosinophils Absolute: 0.1 10*3/uL (ref 0.0–0.5)
Eosinophils Relative: 1 %
HCT: 33.7 % — ABNORMAL LOW (ref 39.0–52.0)
Hemoglobin: 10 g/dL — ABNORMAL LOW (ref 13.0–17.0)
Immature Granulocytes: 1 %
Lymphocytes Relative: 23 %
Lymphs Abs: 2.2 10*3/uL (ref 0.7–4.0)
MCH: 26 pg (ref 26.0–34.0)
MCHC: 29.7 g/dL — ABNORMAL LOW (ref 30.0–36.0)
MCV: 87.8 fL (ref 80.0–100.0)
Monocytes Absolute: 1.1 10*3/uL — ABNORMAL HIGH (ref 0.1–1.0)
Monocytes Relative: 12 %
Neutro Abs: 6.2 10*3/uL (ref 1.7–7.7)
Neutrophils Relative %: 63 %
Platelets: 272 10*3/uL (ref 150–400)
RBC: 3.84 MIL/uL — ABNORMAL LOW (ref 4.22–5.81)
RDW: 14.8 % (ref 11.5–15.5)
WBC: 9.7 10*3/uL (ref 4.0–10.5)
nRBC: 0.2 % (ref 0.0–0.2)

## 2019-04-29 LAB — MAGNESIUM: Magnesium: 2 mg/dL (ref 1.7–2.4)

## 2019-04-29 LAB — CULTURE, BLOOD (ROUTINE X 2): Special Requests: ADEQUATE

## 2019-04-29 MED ORDER — SODIUM CHLORIDE 0.9% FLUSH: 10.0000 mL | INTRAVENOUS | Status: DC | PRN

## 2019-04-29 MED ORDER — CEFTRIAXONE IV (FOR PTA / DISCHARGE USE ONLY): 2.0000 g | INTRAVENOUS | 0 refills | Status: AC

## 2019-04-29 MED ORDER — CHLORHEXIDINE GLUCONATE CLOTH 2 % EX PADS: 6.0000 | MEDICATED_PAD | Freq: Every day | CUTANEOUS | Status: DC

## 2019-04-29 MED ORDER — SODIUM CHLORIDE 0.9% FLUSH: 10.0000 mL | Freq: Two times a day (BID) | INTRAVENOUS | Status: DC

## 2019-04-29 NOTE — Progress Notes (Signed)
PHARMACY CONSULT NOTE FOR:  OUTPATIENT  PARENTERAL ANTIBIOTIC THERAPY (OPAT)  Indication: E coli bacteremia (note that Enterococcus only in the UCx - 1000 colonies, do not treat per ID) Regimen: Rocephin 2gm IV q24 End date: 05/04/2019  IV antibiotic discharge orders are pended. To discharging provider:  please sign these orders via discharge navigator,  Select New Orders & click on the button choice - Manage This Unsigned Work.     Thank you for allowing pharmacy to be a part of this patient's care.  Minda Ditto PharmD 04/29/2019, 10:13 AM

## 2019-04-29 NOTE — TOC Progression Note (Addendum)
Transition of Care [facility]) - Progression Note    Patient Details  Name: Rodney Sullivan MRN: [id number] Date of Birth: 07-24-1940  [facility]) CM/SW [place], [place] Phone Number: 04/29/2019, 11:47 AM  Clinical Narrative:  Referred patient to [facility] at [facility] for home infusion.  She stated they will accept patient, and coordinate with Georgina Snell at [place] for services in home.   Confirmed with Pam that patient is d/cing today.  She has been in touch with Georgina Snell, wife of patient and nursing here.  All systems go.  TOC sign off.    Expected Discharge Plan: [place] Barriers to Discharge: Continued Medical Work up  Expected Discharge Plan and Services Expected Discharge Plan: [place]   Discharge Planning Services: CM Consult   Living arrangements for the past 2 months: Single Family Home Expected Discharge Date: (unknown)                           HH Agency: [facility] Date [place]: 04/28/19 Time [place]: [place] Representative spoke with at [place]: [place] ([place]) Interventions    Readmission Risk Interventions Readmission Risk Prevention Plan 03/18/2019  Transportation Screening Complete  PCP or Specialist Appt within 3-5 Days Complete  HRI or [place] Complete  Social Work Consult for [place] Planning/Counseling Complete  Palliative Care Screening Not Applicable  Medication Review Press photographer) Complete  Some recent data might be hidden

## 2019-04-29 NOTE — Plan of Care (Signed)
  Problem: Clinical Measurements: Goal: Respiratory complications will improve Outcome: Progressing Goal: Cardiovascular complication will be avoided Outcome: Progressing   Problem: Activity: Goal: Risk for activity intolerance will decrease Outcome: Progressing   Problem: Nutrition: Goal: Adequate nutrition will be maintained Outcome: Progressing   Problem: Elimination: Goal: Will not experience complications related to bowel motility Outcome: Progressing Goal: Will not experience complications related to urinary retention Outcome: Progressing   Problem: Pain Managment: Goal: General experience of comfort will improve Outcome: Progressing   Problem: Safety: Goal: Ability to remain free from injury will improve Outcome: Progressing

## 2019-04-29 NOTE — Telephone Encounter (Signed)
Post ED Visit - Positive Culture Follow-up  Culture report reviewed by antimicrobial stewardship pharmacist: [place] Team []  [facility], Pharm.D. []  Heide Guile, Pharm.D., BCPS AQ-ID []  Parks Neptune, Pharm.D., BCPS []  Alycia Rossetti, Pharm.D., BCPS []  [place], [place].D., BCPS, AAHIVP []  [name], Pharm.D., BCPS, AAHIVP []  Salome Arnt, PharmD, BCPS []  Johnnette Gourd, PharmD, BCPS []  [place] Better, PharmD, BCPS []  Leeroy Cha, PharmD []  Laqueta Linden, PharmD, BCPS []  Albertina Parr, PharmD  [place] Team [x]  Leodis Sias, PharmD []  Lindell Spar, PharmD []  Royetta Asal, PharmD []  Graylin Shiver, Rph []  Rema Fendt) Glennon Mac, PharmD []  Arlyn Dunning, PharmD []  Netta Cedars, PharmD []  Dia Sitter, PharmD []  Leone Haven, PharmD []  Gretta Arab, PharmD []  Theodis Shove, PharmD []  Peggyann Juba, PharmD []  Reuel Boom, PharmD   Positive urine culture  and no further patient follow-up is required at this time.  Rodney Sullivan 04/29/2019, 10:52 AM

## 2019-04-30 ENCOUNTER — Telehealth: Payer: Self-pay

## 2019-04-30 NOTE — Telephone Encounter (Signed)
Post ED Visit - Positive Culture Follow-up  Culture report reviewed by antimicrobial stewardship pharmacist: [place] Team []  Elenor Quinones, Pharm.D. []  Heide Guile, Pharm.D., BCPS AQ-ID []  Parks Neptune, Pharm.D., BCPS []  Alycia Rossetti, Pharm.D., BCPS []  [place], Pharm.D., BCPS, AAHIVP []  [name], Pharm.D., BCPS, AAHIVP []  Salome Arnt, PharmD, BCPS []  Johnnette Gourd, PharmD, BCPS []  Hughes Better, PharmD, BCPS []  Leeroy Cha, PharmD []  Laqueta Linden, PharmD, BCPS []  Albertina Parr, PharmD  [place] Team [x]  Leodis Sias, PharmD []  Lindell Spar, PharmD []  Royetta Asal, PharmD []  Graylin Shiver, Rph []  Rema Fendt) Glennon Mac, PharmD []  Arlyn Dunning, PharmD []  Netta Cedars, PharmD []  Dia Sitter, PharmD []  Leone Haven, PharmD []  Gretta Arab, PharmD []  Theodis Shove, PharmD []  Peggyann Juba, PharmD []  Reuel Boom, PharmD   Positive [facility] culture Treated with Rocephin, organism sensitive to the same and no further patient follow-up is required at this time.  Genia Del 04/30/2019, 9:50 AM

## 2019-05-01 LAB — CULTURE, BLOOD (ROUTINE X 2)
Culture: NO GROWTH
Special Requests: ADEQUATE

## 2019-05-02 LAB — CULTURE, BLOOD (ROUTINE X 2)
Culture: NO GROWTH
Special Requests: ADEQUATE

## 2019-05-03 LAB — CULTURE, BLOOD (ROUTINE X 2)
Culture: NO GROWTH
Special Requests: ADEQUATE

## 2019-05-03 NOTE — Discharge Summary (Signed)
[facility] Discharge Summary   Patient: Rodney Sullivan LZJ:[id number]   PCP: Rodney Garibaldi, PA-C DOB: 14-Nov-1940   Date of admission: 04/27/2019   Date of discharge: 04/29/2019     Discharge Diagnoses:  Principal Problem:   Bacteremia due to Escherichia coli Active Problems:   Crohn disease ([place])   Sepsis ([place])   Peripheral arterial disease ([place])   DM (diabetes mellitus), type 2 with peripheral vascular complications ([place])   Admitted From: Home Disposition:  Home   Recommendations for Outpatient Follow-up:  1. PCP: Follow-up in 1 week, ensure PICC line is removed once antibiotics are completed 2. Follow up LABS/TEST: None, GI follow-up recommended  Follow-up Information    Bulla, Rodney Rota, PA-C. Schedule an appointment as soon as possible for a visit in 1 week(s).   Specialty: Internal Medicine Contact information: [address] [place] [address] [phone]        Campbell Riches, MD. Call.   Specialty: Infectious Diseases Why: as needed. Contact information: [place] [address] [phone]        gastroenterologist. Schedule an appointment as soon as possible for a visit in 1 month(s).          Diet recommendation: Cardiac diet  Activity: The patient is advised to gradually reintroduce usual activities,as tolerated.  Discharge Condition: good  Code Status: Full code   History of present illness: As per the H and P dictated on admission, "Rodney Sullivan is a 78 y.o. male with medical history significant of with history of Crohn's disease, history of PE/DVT, diabetes mellitus type 2, essential hypertension, peripheral vascular disease was asked to come back to the ER for evaluation of positive blood cultures.  Initially patient presented to the hospital yesterday with generalized weakness, confusion and abdominal discomfort.  Work-up showed elevated WBC and lactic acidosis.  CT of the abdomen pelvis was negative, he  was given 1 dose of IV cefepime and discharged home.  Blood cultures were drawn prior to discharge.  Today he received a call that his blood cultures are positive therefore advised to come back to the ER. He was seen by vascular this morning secondary to recent left foot amputation and was told it is healing well and recommended routine wound VAC changes.  In the ER today patient was noted to have leukocytosis and lactic acidosis.  Blood cultures were positive for E. coli and Enterobacter.  He was given dose of cefepime.  Seen by infectious disease."  Hospital Course:  Summary of his active problems in the hospital is as following. Bacteremia secondary to E. coli/Enterobacter Sepsis secondary to gram-negative rods -Unknown clear source GI versus GU versus left stump -Follow-up culture sensitivities. Continue IV cefepime -MRI left stumpoffered- patient and wife does not want this to be done as they were assured by Dr Rodney Sullivan there is no underlying infection and its healing well. I have explained MRI is to be thorough but still would not want one at this time. -IV fluids. Supportive care. Trend lactic acid -UA-negative -CT abdomen pelvis done 10/19-stable probable scarring extending between proximal duodenum, ileum and colon. Fistula cannot be excluded.  Peripheral vascular disease status post recent left-sided BKA 04/09/2019 -Seen by vascular surgery, granulating nicely. Recommend wound VAC change 3 times weekly -Consult wound care for wound VAC placement tomorrow morning. If they are not able to, we can let vascular surgery know.  Crohn's disease -This is managed by gastroenterologist at [facility]. Continue prednisone with  caution especially the setting of active infection. Discussed with [facility] gastroenterology who felt that based on the evidence of no acute inflammation on the CT scan as well as no symptoms from patient, there is no indication for further work-up or  treatment changes for patient's Crohn's disease.  Diabetes mellitus type 2, insulin-dependent -Insulin sliding scale and Accu-Chek.  Essential hypertension -Holding off on Norvasc. Stabilize blood pressure and slowly resume as necessary.  Patient was ambulatory without any assistance. On the day of the discharge the patient's vitals were stable, and no other acute medical condition were reported by patient. the patient was felt safe to be discharge at Home with Home health.  Consultants: ID, phone consultation with GI Procedures: PICC line placement  DISCHARGE MEDICATION: Allergies as of 04/29/2019      Reactions   Ciprofloxacin Other (See Comments)   tendons hurt   Shrimp [shellfish Allergy]       Medication List    STOP taking these medications   cephALEXin 500 MG capsule Commonly known as: Keflex     TAKE these medications   acetaminophen 500 MG tablet Commonly known as: TYLENOL Take 500 mg by mouth every 6 (six) hours as needed.   albuterol 108 (90 Base) MCG/ACT inhaler Commonly known as: VENTOLIN HFA Inhale 2 puffs into the lungs every 4 (four) hours as needed for wheezing or shortness of breath.   allopurinol 100 MG tablet Commonly known as: ZYLOPRIM Take 100 mg by mouth daily.   amLODipine 2.5 MG tablet Commonly known as: NORVASC Take 2.5 mg by mouth daily with supper.   aspirin EC 81 MG tablet Take 81 mg by mouth daily with supper.   B-12 2500 MCG Tabs Take 2,500 mcg by mouth daily.   benzonatate 100 MG capsule Commonly known as: TESSALON Take 100 mg by mouth 3 (three) times daily as needed for cough.   cefTRIAXone  IVPB Commonly known as: ROCEPHIN Inject 2 g into the vein daily for 5 doses. Indication:  E coli bacteremia  Last Day of Therapy:  05/04/2019   Cimzia 2 X 200 MG Kit Generic drug: Certolizumab Pegol Inject 400 mg into the skin every 28 (twenty-eight) days.   Combigan 0.2-0.5 % ophthalmic solution Generic drug:  brimonidine-timolol Place 1 drop into both eyes 2 (two) times a day.   Dexamethasone Acetate 8 MG/ML Susp Inject 8 mg into the muscle daily as needed (for Crohn's flare).   famciclovir 250 MG tablet Commonly known as: FAMVIR Take 250 mg by mouth 3 (three) times daily as needed (fungus on corner of mouth flare).   ferrous sulfate 325 (65 FE) MG tablet Take 325 mg by mouth daily.   Fish Oil 1000 MG Caps Take 1,000 mg by mouth daily.   latanoprost 0.005 % ophthalmic solution Commonly known as: XALATAN Place 1 drop into both eyes at bedtime.   mesalamine 500 MG CR capsule Commonly known as: PENTASA Take 2,000 mg by mouth 2 (two) times daily.   multivitamin with minerals Tabs tablet Take 1 tablet by mouth daily.   OVER THE COUNTER MEDICATION Take 5 mLs by mouth every evening. CBD oil drops   oxyCODONE 5 MG immediate release tablet Commonly known as: Oxy IR/ROXICODONE Take 1 tablet (5 mg total) by mouth every 6 (six) hours as needed for severe pain.   pantoprazole 40 MG tablet Commonly known as: PROTONIX Take 40 mg by mouth daily at 3 pm. In the afternoon   pioglitazone 15 MG tablet Commonly known as:  ACTOS Take 15 mg by mouth daily.   pravastatin 10 MG tablet Commonly known as: PRAVACHOL Take 1 tablet (10 mg total) by mouth every evening. What changed:   when to take this  additional instructions   predniSONE 10 MG tablet Commonly known as: DELTASONE Take 20 mg by mouth daily after breakfast. Total daily dose=22.5 mg (take with 0.5 tablet 67m tablet)   traMADol 50 MG tablet Commonly known as: ULTRAM Take 1 tablet (50 mg total) by mouth at bedtime as needed for moderate pain. What changed: when to take this   Vitamin D 50 MCG (2000 UT) tablet Take 2,000 Units by mouth daily.   Zims Crack Creme Crea Apply 1 application topically daily as needed (dry skin).            Home Infusion Instuctions  (From admission, onward)         Start     Ordered    04/29/19 0000  Home infusion instructions [facility] May follow ASpringerDosing Protocol; May administer Cathflo as needed to maintain patency of vascular access device.; Flushing of vascular access device: per [facility]: 0.9% NaCl pre/post medica...    Question Answer Comment  Instructions May follow AEdmondDosing Protocol   Instructions May administer Cathflo as needed to maintain patency of vascular access device.   Instructions Flushing of vascular access device: per APartridge HouseProtocol: 0.9% NaCl pre/post medication administration and prn patency; Heparin 100 u/ml, 568mfor implanted ports and Heparin 10u/ml, 98m49mor all other central venous catheters.   Instructions May follow A[facility] Anaphylaxis Protocol for First Dose Administration in the home: 0.9% NaCl at 25-50 ml/hr to maintain IV access for protocol meds. Epinephrine 0.3 ml IV/IM PRN and Benadryl 25-50 IV/IM PRN s/s of anaphylaxis.   Instructions [facility] Infusion Coordinator (RN) to assist per patient IV care needs in the home PRN.      04/29/19 1451         Allergies  Allergen Reactions   Ciprofloxacin Other (See Comments)    tendons hurt   Shrimp [Shellfish Allergy]    Discharge Instructions    Diet - low sodium heart healthy   Complete by: As directed    Home infusion instructions [facility] May follow ACHCoalmontsing Protocol; May administer Cathflo as needed to maintain patency of vascular access device.; Flushing of vascular access device: per AHCA[facility]ucks Countyotocol: 0.9% NaCl pre/post medica...   Complete by: As directed    Instructions: May follow ACHAkronsing Protocol   Instructions: May administer Cathflo as needed to maintain patency of vascular access device.   Instructions: [place]ushing of vascular access device: per AHCPeak Surgery Center LLCotocol: 0.9% NaCl pre/post medication administration and prn patency; Heparin 100 u/ml, 98ml80mr implanted ports and Heparin 10u/ml, 98ml 10m all other central venous  catheters.   Instructions: May follow [facility] Anaphylaxis Protocol for First Dose Administration in the home: 0.9% NaCl at 25-50 ml/hr to maintain IV access for protocol meds. Epinephrine 0.3 ml IV/IM PRN and Benadryl 25-50 IV/IM PRN s/s of anaphylaxis.   Instructions: AdvanGreenbriarsion Coordinator (RN) to assist per patient IV care needs in the home PRN.   Increase activity slowly   Complete by: As directed      Discharge Exam: Filed Weights   04/28/19 0500 04/29/19 0500  Weight: 75.4 kg 77.4 kg   Vitals:   04/28/19 2040 04/29/19 0510  BP: 103/61 (!) 141/73  Pulse: 74 79  Resp:  16 16  Temp: 97.8 F (36.6 C) 98.2 F (36.8 C)  SpO2: 98% 97%   General: Appear in mild distress, no Rash; Oral Mucosa Clear, moist. no Abnormal Mass Or lumps Cardiovascular: S1 and S2 Present, no Murmur, Respiratory: normal respiratory effort, Bilateral Air entry present and Clear to Auscultation, no Crackles, no wheezes Abdomen: Bowel Sound present, Soft and no tenderness, no hernia Extremities: no Pedal edema, no calf tenderness, left BKA Neurology: alert and oriented to time, place, and person affect appropriate.  The results of significant diagnostics from this hospitalization (including imaging, microbiology, ancillary and laboratory) are listed below for reference.    Significant Diagnostic Studies: Ct Abdomen Pelvis W Contrast  Result Date: 04/26/2019 CLINICAL DATA:  Acute generalized abdominal pain. EXAM: CT ABDOMEN AND PELVIS WITH CONTRAST TECHNIQUE: Multidetector CT imaging of the abdomen and pelvis was performed using the standard protocol following bolus administration of intravenous contrast. CONTRAST:  154m OMNIPAQUE IOHEXOL 300 MG/ML  SOLN COMPARISON:  December 08, 2014. FINDINGS: Lower chest: No acute abnormality. Hepatobiliary: No focal liver abnormality is seen. Status post cholecystectomy. No biliary dilatation. Pancreas: Unremarkable. No pancreatic ductal dilatation or surrounding  inflammatory changes. Spleen: Normal in size without focal abnormality. Adrenals/Urinary Tract: Adrenal glands appear normal. Stable bilateral renal cysts are noted. No hydronephrosis or renal obstruction is noted. No renal or1 ureteral calculi are noted. Small bladder diverticulum is noted, but otherwise urinary bladder is unremarkable, although partly obscured by scatter artifact arising from bilateral hip prostheses. Stomach/Bowel: The stomach is unremarkable. Status post partial colectomy. There remains probable scarring extending between the proximal duodenum, ileum and colon which is unchanged compared to prior exam. Fistulous connection cannot be excluded. No acute bowel inflammation or dilatation is noted. Vascular/Lymphatic: Aortic atherosclerosis. No enlarged abdominal or pelvic lymph nodes. Reproductive: Not well visualized due to scatter artifact arising from bilateral hip arthroplasties. Other: No abdominal wall hernia or abnormality. No abdominopelvic ascites. Musculoskeletal: Bilateral hip prosthesis as noted above. No acute abnormality is noted. IMPRESSION: Stable probable scarring is seen extending between the proximal duodenum, ileum and colon. Fistulous connection cannot be excluded. No definite evidence of acute bowel inflammation or obstruction is noted. Aortic Atherosclerosis (ICD10-I70.0). Electronically Signed   By: JMarijo ConceptionM.D.   On: 04/26/2019 17:59   Dg Chest Port 1 View  Result Date: 04/26/2019 CLINICAL DATA:  Shortness of breath. New onset upper abdominal pain. EXAM: PORTABLE CHEST 1 VIEW COMPARISON:  02/15/2019. Chest CTA dated 07/15/2017. FINDINGS: Stable enlarged cardiac silhouette. Stable elevated right hemidiaphragm with a probable large eventration. Clear lungs with normal vascularity. No pleural fluid. Mild diffuse osteopenia. Thoracic and right shoulder degenerative changes with superior migration of the right humeral head, compatible with a large, chronic rotator  cuff tear. IMPRESSION: 1. No acute abnormality. 2. Stable cardiomegaly. Electronically Signed   By: SClaudie ReveringM.D.   On: 04/26/2019 13:50   UKoreaEkg Site Rite  Result Date: 04/29/2019 If Site Rite image not attached, placement could not be confirmed due to current cardiac rhythm.   Microbiology: Recent Results (from the past 240 hour(s))  Culture, blood (routine x 2)     Status: Abnormal   Collection Time: 04/26/19  3:05 PM   Specimen: BLOOD RIGHT HAND  Result Value Ref Range Status   Specimen Description   Final    BLOOD RIGHT HAND Performed at [facility] [address] [place] [address]   Special Requests   Final    BOTTLES  DRAWN AEROBIC AND ANAEROBIC Blood Culture adequate volume Performed at [facility] [address] [place], [place] [address]    Culture  Setup Time   Final    GRAM NEGATIVE RODS ANAEROBIC BOTTLE ONLY CRITICAL RESULT CALLED TO, READ BACK BY AND VERIFIED WITH: C. REED, RN (MCHP) AT 5400 ON 04/27/19 BY C. JESSUP, MT. Performed at [facility], [place] [address], [place], [place] [address]    Culture ESCHERICHIA COLI (A)  Final   Report Status 04/29/2019 FINAL  Final   Organism ID, Bacteria ESCHERICHIA COLI  Final      Susceptibility   Escherichia coli - MIC*    AMPICILLIN 4 SENSITIVE Sensitive     CEFAZOLIN <=4 SENSITIVE Sensitive     CEFEPIME <=1 SENSITIVE Sensitive     CEFTAZIDIME <=1 SENSITIVE Sensitive     CEFTRIAXONE <=1 SENSITIVE Sensitive     CIPROFLOXACIN <=0.25 SENSITIVE Sensitive     GENTAMICIN <=1 SENSITIVE Sensitive     IMIPENEM <=0.25 SENSITIVE Sensitive     TRIMETH/SULFA <=20 SENSITIVE Sensitive     AMPICILLIN/SULBACTAM <=2 SENSITIVE Sensitive     PIP/TAZO <=4 SENSITIVE Sensitive     Extended ESBL NEGATIVE Sensitive     * ESCHERICHIA COLI  Culture, blood (routine x 2)     Status: None   Collection Time: 04/26/19  3:05 PM   Specimen: BLOOD  Result Value Ref Range Status    Specimen Description   Final    BLOOD RIGHT ANTECUBITAL Performed at [facility] [address], [place], [place] [address]    Special Requests   Final    BOTTLES DRAWN AEROBIC AND ANAEROBIC Blood Culture adequate volume Performed at [place] [address] [place], [place] [address]    Culture   Final    NO GROWTH 5 DAYS Performed at [facility], [place] [address] [place],  [address]    Report Status 05/01/2019 FINAL  Final  Urine culture     Status: Abnormal   Collection Time: 04/26/19  3:05 PM   Specimen: In/Out Cath Urine  Result Value Ref Range Status   Specimen Description   Final    IN/OUT CATH URINE Performed at [facility] [address], [place],  [address]    Special Requests   Final    NONE Performed at [facility], [place] [address], [place], [place] [address]    Culture 1,000 COLONIES/mL ENTEROCOCCUS FAECALIS (A)  Final   Report Status 04/28/2019 FINAL  Final   Organism ID, Bacteria ENTEROCOCCUS FAECALIS (A)  Final      Susceptibility   Enterococcus faecalis - MIC*    AMPICILLIN 4 SENSITIVE Sensitive     LEVOFLOXACIN 4 INTERMEDIATE Intermediate     NITROFURANTOIN <=16 SENSITIVE Sensitive     VANCOMYCIN 1 SENSITIVE Sensitive     * 1,000 COLONIES/mL ENTEROCOCCUS FAECALIS  Blood Culture ID Panel (Reflexed)     Status: Abnormal   Collection Time: 04/26/19  3:05 PM  Result Value Ref Range Status   Enterococcus species NOT DETECTED NOT DETECTED Final   Listeria monocytogenes NOT DETECTED NOT DETECTED Final   Staphylococcus species NOT DETECTED NOT DETECTED Final   Staphylococcus aureus (BCID) NOT DETECTED NOT DETECTED Final   Streptococcus species NOT DETECTED NOT DETECTED Final   Streptococcus agalactiae NOT DETECTED NOT DETECTED Final   Streptococcus pneumoniae NOT DETECTED NOT DETECTED Final   Streptococcus pyogenes NOT DETECTED NOT DETECTED  Final    Acinetobacter baumannii NOT DETECTED NOT DETECTED Final   Enterobacteriaceae species DETECTED (A) NOT DETECTED Final    Comment: Enterobacteriaceae represent a large family of gram-negative bacteria, not a single organism. CRITICAL RESULT CALLED TO, READ BACK BY AND VERIFIED WITH: C. REED, RN (MCHP) AT 8127 ON 04/27/19 BY C. JESSUP, MT.    Enterobacter cloacae complex NOT DETECTED NOT DETECTED Final   Escherichia coli DETECTED (A) NOT DETECTED Final    Comment: CRITICAL RESULT CALLED TO, READ BACK BY AND VERIFIED WITH: C. REED, RN (MCHP) AT 5170 ON 04/27/19 BY C. JESSUP, MT.    Klebsiella oxytoca NOT DETECTED NOT DETECTED Final   Klebsiella pneumoniae NOT DETECTED NOT DETECTED Final   Proteus species NOT DETECTED NOT DETECTED Final   Serratia marcescens NOT DETECTED NOT DETECTED Final   Carbapenem resistance NOT DETECTED NOT DETECTED Final   Haemophilus influenzae NOT DETECTED NOT DETECTED Final   Neisseria meningitidis NOT DETECTED NOT DETECTED Final   Pseudomonas aeruginosa NOT DETECTED NOT DETECTED Final   Candida albicans NOT DETECTED NOT DETECTED Final   Candida glabrata NOT DETECTED NOT DETECTED Final   Candida krusei NOT DETECTED NOT DETECTED Final   Candida parapsilosis NOT DETECTED NOT DETECTED Final   Candida tropicalis NOT DETECTED NOT DETECTED Final    Comment: Performed at [facility], [place] [address] [place], [place] [address]  SARS Coronavirus 2 by RT PCR (hospital order, performed in [facility] hospital lab) Nasopharyngeal Nasopharyngeal Swab     Status: None   Collection Time: 04/26/19  5:20 PM   Specimen: Nasopharyngeal Swab  Result Value Ref Range Status   SARS Coronavirus 2 NEGATIVE NEGATIVE Final    Comment: (NOTE) If result is NEGATIVE SARS-CoV-2 target nucleic acids are NOT DETECTED. The SARS-CoV-2 RNA is generally detectable in upper and lower  respiratory specimens during the acute phase of infection. The lowest  concentration of SARS-CoV-2 viral  copies this assay can detect is 250  copies / mL. A negative result does not preclude SARS-CoV-2 infection  and should not be used as the sole basis for treatment or other  patient management decisions.  A negative result may occur with  improper specimen collection / handling, submission of specimen other  than nasopharyngeal swab, presence of viral mutation(s) within the  areas targeted by this assay, and inadequate number of viral copies  (<250 copies / mL). A negative result must be combined with clinical  observations, patient history, and epidemiological information. If result is POSITIVE SARS-CoV-2 target nucleic acids are DETECTED. The SARS-CoV-2 RNA is generally detectable in upper and lower  respiratory specimens dur ing the acute phase of infection.  Positive  results are indicative of active infection with SARS-CoV-2.  Clinical  correlation with patient history and other diagnostic information is  necessary to determine patient infection status.  Positive results do  not rule out bacterial infection or co-infection with other viruses. If result is PRESUMPTIVE POSTIVE SARS-CoV-2 nucleic acids MAY BE PRESENT.   A presumptive positive result was obtained on the submitted specimen  and confirmed on repeat testing.  While 2019 novel coronavirus  (SARS-CoV-2) nucleic acids may be present in the submitted sample  additional confirmatory testing may be necessary for epidemiological  and / or clinical management purposes  to differentiate between  SARS-CoV-2 and other Sarbecovirus currently known to infect humans.  If clinically indicated additional testing with an alternate test  methodology [phone]) is advised. The SARS-CoV-2 RNA is generally  detectable in upper and lower respiratory sp ecimens during the acute  phase of infection. The expected result is Negative. Fact Sheet for Patients:  StrictlyIdeas.no Fact Sheet for Healthcare  Providers: BankingDealers.co.za This test is not yet approved or cleared by the [place] FDA and has been authorized for detection and/or diagnosis of SARS-CoV-2 by FDA under an Emergency Use Authorization (EUA).  This EUA will remain in effect (meaning this test can be used) for the duration of the COVID-19 declaration under Section 564(b)(1) of the Act, 21 U.S.C. section 360bbb-3(b)(1), unless the authorization is terminated or revoked sooner. Performed at [facility], [place] [address] [place], [place] [address]   Urine culture     Status: Abnormal   Collection Time: 04/27/19  2:12 PM   Specimen: Urine, Random  Result Value Ref Range Status   Specimen Description   Final    URINE, RANDOM Performed at [place] [address] [place], [place] [address]    Special Requests   Final    NONE Performed at [facility], [place] [address] [place], [place] [address]    Culture (A)  Final    <10,000 COLONIES/mL INSIGNIFICANT GROWTH Performed at [place] [address] [place], [place] [address]    Report Status 04/28/2019 FINAL  Final  Culture, blood (routine x 2)     Status: None   Collection Time: 04/27/19  5:15 PM   Specimen: BLOOD RIGHT HAND  Result Value Ref Range Status   Specimen Description   Final    BLOOD RIGHT HAND Performed at [place] [address], [place], [place] [address]    Special Requests   Final    BOTTLES DRAWN AEROBIC ONLY Blood Culture adequate volume Performed at [facility] [address], [place], [place] [address]    Culture   Final    NO GROWTH 5 DAYS Performed at [facility], [place] [address], [place], [place] [address]    Report Status 05/02/2019 FINAL  Final  Culture, blood (Routine X 2) w Reflex to ID Panel     Status: None   Collection Time: 04/28/19  5:34 AM   Specimen: BLOOD  Result Value Ref  Range Status   Specimen Description   Final    BLOOD LEFT ANTECUBITAL Performed at [facility] [address], [place], [place] [address]    Special Requests   Final    BOTTLES DRAWN AEROBIC AND ANAEROBIC Blood Culture adequate volume Performed at [place] [address] [place], [place] [address]    Culture   Final    NO GROWTH 5 DAYS Performed at [facility], [place] [address], [place], [place] [address]    Report Status 05/03/2019 FINAL  Final     Labs: CBC: Recent Labs  Lab 04/26/19 1437 04/27/19 1412 04/27/19 1715 04/28/19 0534 04/29/19 0556  WBC 19.1* 17.8* 14.4* 10.4 9.7  NEUTROABS 17.1* 13.9*  --   --  6.2  HGB 12.3* 10.4* 9.6* 8.9* 10.0*  HCT 41.5 35.6* 32.7* 29.8* 33.7*  MCV 87.6 88.6 89.3 88.2 87.8  PLT 389 298 262 237 675   Basic Metabolic Panel: Recent Labs  Lab 04/26/19 1437 04/27/19 1412 04/27/19 1715 04/28/19 0534 04/29/19 0556  NA 139 140  --  141 140  K 4.0 3.7  --  3.2* 3.9  CL 103 108  --  112* 110  CO2 22 21*  --  20* 20*  GLUCOSE 253* 253*  --  102* 120*  BUN 17 21  --  15 12  CREATININE 0.97 0.90 0.66 0.70 0.83  CALCIUM 9.2 8.9  --  7.9* 8.6*  MG  --   --   --  1.7 2.0   Liver Function Tests: Recent Labs  Lab 04/26/19 1437 04/28/19 0534 04/29/19 0556  AST 32 21 21  ALT 23 20 21   ALKPHOS 77 38 49  BILITOT 0.4 0.3 0.3  PROT 6.5 4.6* 5.2*  ALBUMIN 3.4* 2.3* 2.7*   Recent Labs  Lab 04/26/19 1437  LIPASE 37   No results for input(s): AMMONIA in the last 168 hours. Cardiac Enzymes: No results for input(s): CKTOTAL, CKMB, CKMBINDEX, TROPONINI in the last 168 hours. BNP (last 3 results) No results for input(s): BNP in the last 8760 hours. CBG: Recent Labs  Lab 04/28/19 1132 04/28/19 1627 04/28/19 2041 04/29/19 0726 04/29/19 1127  GLUCAP 146* 169* 148* 111* 113*    Time spent: 35 minutes  Signed:  Berle Mull  [facility] 04/29/2019 8:36 AM

## 2019-05-04 ENCOUNTER — Telehealth: Payer: Self-pay

## 2019-05-04 NOTE — Telephone Encounter (Signed)
P wife is calling to inform  Dr Johnnye Sima today is the last day

## 2019-05-25 ENCOUNTER — Encounter: Payer: Self-pay | Admitting: Vascular Surgery

## 2019-05-25 ENCOUNTER — Ambulatory Visit (INDEPENDENT_AMBULATORY_CARE_PROVIDER_SITE_OTHER): Payer: Self-pay | Admitting: Vascular Surgery

## 2019-05-25 ENCOUNTER — Other Ambulatory Visit: Payer: Self-pay | Admitting: *Deleted

## 2019-05-25 ENCOUNTER — Other Ambulatory Visit: Payer: Self-pay

## 2019-05-25 ENCOUNTER — Encounter: Payer: Self-pay | Admitting: *Deleted

## 2019-05-25 VITALS — BP 108/65 | HR 79 | Temp 97.2°F | Resp 18 | Ht 67.0 in | Wt 170.0 lb

## 2019-05-25 DIAGNOSIS — I739 Peripheral vascular disease, unspecified: Secondary | ICD-10-CM

## 2019-05-25 NOTE — Progress Notes (Signed)
Patient name: Rodney Sullivan MRN: [id number] DOB: June 27, 1941 Sex: male  REASON FOR VISIT: F/U after L BKA debridement  HPI: Rodney Sullivan is a 78 y.o. male that presents for wound check after left BKA debridement on 04/09/19.  He had a left BKA on 03/03/2019 for critical limb ischemia with tissue loss and had already undergone revascularization with left PT atherectomy.  Reports that this was healing accordingly until he fell after discharge.   Stump was debrided in the OR and has been in vac dressing with 3x weekly changes.  Wound continues to show improvement.  Still doing wound VAC at home with home health.  Patient also notes tissue loss of the right third toe.  States this has been present for a year.  Past Medical History:  Diagnosis Date  . Arthritis   . Bacteremia due to Escherichia coli 11/2013  . Cancer (HCC)    basal skin- arm, arm squamous  . Crohn disease ([place])   . Diabetes mellitus without complication ([place])   . DM (diabetes mellitus), type 2 with peripheral vascular complications ([place]) 0/04/9322  . DVT (deep venous thrombosis) ([place]) 11/27/13  . Dyspnea    at times  . History of kidney stones    passed  . History of skin cancer    ARMS & FACE  . Peripheral arterial disease ([place]) 02/15/2019  . Pulmonary embolism ([place]) 11/27/13   . Shoulder problem    left torn ligament-     Past Surgical History:  Procedure Laterality Date  . ABDOMINAL AORTOGRAM N/A 12/14/2018   Procedure: ABDOMINAL AORTOGRAM;  Surgeon: Waynetta Sandy, MD;  Location: [facility];  Service: Cardiovascular;  Laterality: N/A;  . ABDOMINAL AORTOGRAM W/LOWER EXTREMITY Bilateral 02/17/2019   Procedure: ABDOMINAL AORTOGRAM W/LOWER EXTREMITY;  Surgeon: [name], MD;  Location: [facility];  Service: Cardiovascular;  Laterality: Bilateral;  . ABDOMINAL SURGERY     Bowel resection x2  . AMPUTATION Right 12/21/2018   Procedure: RIGHT LONG FINGER AMPUTATION;  Surgeon: Leanora Cover, MD;  Location: [facility];  Service: Orthopedics;  Laterality: Right;  . AMPUTATION Left 03/03/2019   Procedure: AMPUTATION BELOW KNEE;  Surgeon: [name], MD;  Location: [facility];  Service: Vascular;  Laterality: Left;  . APPLICATION OF WOUND VAC Left 04/09/2019   Procedure: APPLICATION OF WOUND VAC;  Surgeon: [name], MD;  Location: [facility];  Service: Vascular;  Laterality: Left;  . CHOLECYSTECTOMY    . COLON SURGERY    . HIP SURGERY Bilateral    replacement  . JOINT REPLACEMENT    . LOWER EXTREMITY ANGIOGRAPHY Right 12/14/2018   Procedure: LOWER EXTREMITY ANGIOGRAPHY;  Surgeon: Waynetta Sandy, MD;  Location: [facility];  Service: Cardiovascular;  Laterality: Right;  . PERIPHERAL VASCULAR ATHERECTOMY  02/17/2019   Procedure: PERIPHERAL VASCULAR ATHERECTOMY;  Surgeon: [name], MD;  Location: [facility];  Service: Cardiovascular;;  . UPPER EXTREMITY ANGIOGRAPHY Right 12/14/2018   Procedure: Right UPPER EXTREMITY ANGIOGRAPHY;  Surgeon: Waynetta Sandy, MD;  Location: [facility];  Service: Cardiovascular;  Laterality: Right;  . WOUND DEBRIDEMENT Left 04/09/2019   Procedure: DEBRIDEMENT WOUND LEFT BELOW KNEE STUMP;  Surgeon: [name], MD;  Location: [facility];  Service: Vascular;  Laterality: Left;    Family History  Problem Relation Age of Onset  . Hypertension Mother     SOCIAL HISTORY: Social History   Tobacco Use  . Smoking  status: Former Smoker    Years: 10.00    Types: Pipe  . Smokeless tobacco: Former Systems developer    Quit date: 12/08/1979  Substance Use Topics  . Alcohol use: No    Alcohol/week: 0.0 standard drinks    Allergies  Allergen Reactions  . Ciprofloxacin Other (See Comments)    tendons hurt  . Shrimp [Shellfish Allergy]     Current Outpatient Medications  Medication Sig Dispense Refill  . acetaminophen (TYLENOL) 500 MG tablet Take 500 mg by mouth every 6 (six) hours  as needed.    Marland Kitchen albuterol (VENTOLIN HFA) 108 (90 Base) MCG/ACT inhaler Inhale 2 puffs into the lungs every 4 (four) hours as needed for wheezing or shortness of breath.     . allopurinol (ZYLOPRIM) 100 MG tablet Take 100 mg by mouth daily.    Marland Kitchen amLODipine (NORVASC) 2.5 MG tablet Take 2.5 mg by mouth daily with supper.     Marland Kitchen aspirin EC 81 MG tablet Take 81 mg by mouth daily with supper.     . benzonatate (TESSALON) 100 MG capsule Take 100 mg by mouth 3 (three) times daily as needed for cough.    . brimonidine-timolol (COMBIGAN) 0.2-0.5 % ophthalmic solution Place 1 drop into both eyes 2 (two) times a day.    . cefdinir (OMNICEF) 300 MG capsule     . Certolizumab Pegol (CIMZIA) 2 X 200 MG KIT Inject 400 mg into the skin every 28 (twenty-eight) days.    . Cholecalciferol (VITAMIN D) 50 MCG (2000 UT) tablet Take 2,000 Units by mouth daily.    . Cyanocobalamin (B-12) 2500 MCG TABS Take 2,500 mcg by mouth daily.    Marland Kitchen Dexamethasone Acetate 8 MG/ML SUSP Inject 8 mg into the muscle daily as needed (for Crohn's flare).    . Emollient (ZIMS CRACK CREME) CREA Apply 1 application topically daily as needed (dry skin).     . famciclovir (FAMVIR) 250 MG tablet Take 250 mg by mouth 3 (three) times daily as needed (fungus on corner of mouth flare).     . ferrous sulfate 325 (65 FE) MG tablet Take 325 mg by mouth daily.    Marland Kitchen latanoprost (XALATAN) 0.005 % ophthalmic solution Place 1 drop into both eyes at bedtime.    . mesalamine (PENTASA) 500 MG CR capsule Take 2,000 mg by mouth 2 (two) times daily.    . Multiple Vitamin (MULTIVITAMIN WITH MINERALS) TABS Take 1 tablet by mouth daily.    . Omega-3 Fatty Acids (FISH OIL) 1000 MG CAPS Take 1,000 mg by mouth daily.    Marland Kitchen OVER THE COUNTER MEDICATION Take 5 mLs by mouth every evening. CBD oil drops     . oxyCODONE (OXY IR/ROXICODONE) 5 MG immediate release tablet Take 1 tablet (5 mg total) by mouth every 6 (six) hours as needed for severe pain. 20 tablet 0  .  pantoprazole (PROTONIX) 40 MG tablet Take 40 mg by mouth daily at 3 pm. In the afternoon    . pioglitazone (ACTOS) 15 MG tablet Take 15 mg by mouth daily.     . pravastatin (PRAVACHOL) 10 MG tablet Take 1 tablet (10 mg total) by mouth every evening. (Patient taking differently: Take 10 mg by mouth daily at 3 pm. In the afternoon.) 30 tablet 0  . predniSONE (DELTASONE) 10 MG tablet Take 20 mg by mouth daily after breakfast. Total daily dose=22.5 mg (take with 0.5 tablet 57m tablet)    . traMADol (ULTRAM) 50 MG tablet Take 1  tablet (50 mg total) by mouth at bedtime as needed for moderate pain. (Patient taking differently: Take 50 mg by mouth every 6 (six) hours as needed for moderate pain. )     No current facility-administered medications for this visit.     REVIEW OF SYSTEMS:  [X]  denotes positive finding, [ ]  denotes negative finding Cardiac  Comments:  Chest pain or chest pressure:    Shortness of breath upon exertion:    Short of breath when lying flat:    Irregular heart rhythm:        Vascular    Pain in calf, thigh, or hip brought on by ambulation:    Pain in feet at night that wakes you up from your sleep:     Blood clot in your veins:    Leg swelling:         Pulmonary    Oxygen at home:    Productive cough:     Wheezing:         Neurologic    Sudden weakness in arms or legs:     Sudden numbness in arms or legs:     Sudden onset of difficulty speaking or slurred speech:    Temporary loss of vision in one eye:     Problems with dizziness:         Gastrointestinal    Blood in stool:     Vomited blood:         Genitourinary    Burning when urinating:     Blood in urine:        Psychiatric    Major depression:         Hematologic    Bleeding problems:    Problems with blood clotting too easily:        Skin    Rashes or ulcers:        Constitutional    Fever or chills:      PHYSICAL EXAM: Vitals:   05/25/19 1106  BP: 108/65  Pulse: 79  Resp: 18  Temp:  (!) 97.2 F (36.2 C)  SpO2: 97%  Weight: 170 lb (77.1 kg)  Height: 5' 7"  (1.702 m)    GENERAL: The patient is a well-nourished male, in no acute distress. The vital signs are documented above. CARDIAC: There is a regular rate and rhythm.  VASCULAR:  Left BKA as pictured below with wound healing as pictured below. Right 3rd toe with dry ulcer            DATA:   Reviewed old arteriogram pictures from Dr. 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  PT appears to be dominant flow with multiple high-grade focal stenosis.  Assessment/Plan:  78 year old male status post left BKA on 03/03/19 for CLI with tissue loss that presented post-op after fall on his BKA.  He recently underwent debridement of the left BKA on 04/09/2019.  The wound is healing really nicely.  Continue home health VAC.  As a relates to his right lower extremity he has a third toe wound that has been nonhealing.  He previously had a right lower extremity arteriogram with Dr. Donzetta Matters earlier this year that shows severe tibial disease.  I have recommended aortogram right lower extremity arteriogram with intervention that we will schedule week after Thanksgiving.  [name], MD [facility] Office: [phone] Pager: [phone]

## 2019-06-07 ENCOUNTER — Other Ambulatory Visit (HOSPITAL_COMMUNITY)
Admission: RE | Admit: 2019-06-07 | Discharge: 2019-06-07 | Disposition: A | Discharge status: Home / Self Care | Payer: Medicare HMO | Source: Ambulatory Visit | Attending: Vascular Surgery | Admitting: Vascular Surgery

## 2019-06-07 DIAGNOSIS — Z01812 Encounter for preprocedural laboratory examination: Secondary | ICD-10-CM | POA: Diagnosis present

## 2019-06-07 DIAGNOSIS — Z20828 Contact with and (suspected) exposure to other viral communicable diseases: Secondary | ICD-10-CM | POA: Insufficient documentation

## 2019-06-07 LAB — SARS CORONAVIRUS 2 (TAT 6-24 HRS): SARS Coronavirus 2: NEGATIVE

## 2019-06-09 ENCOUNTER — Encounter (HOSPITAL_COMMUNITY): Payer: Self-pay | Admitting: Vascular Surgery

## 2019-06-09 ENCOUNTER — Ambulatory Visit (HOSPITAL_COMMUNITY)
Admission: RE | Admit: 2019-06-09 | Discharge: 2019-06-09 | Disposition: A | Discharge status: Home / Self Care | Payer: Medicare HMO | Attending: Vascular Surgery | Admitting: Vascular Surgery

## 2019-06-09 ENCOUNTER — Encounter (HOSPITAL_COMMUNITY)
Admission: RE | Disposition: A | Discharge status: Home / Self Care | Payer: Self-pay | Source: Home / Self Care | Attending: Vascular Surgery

## 2019-06-09 ENCOUNTER — Other Ambulatory Visit: Payer: Self-pay

## 2019-06-09 DIAGNOSIS — Z89021 Acquired absence of right finger(s): Secondary | ICD-10-CM | POA: Diagnosis not present

## 2019-06-09 DIAGNOSIS — Z79899 Other long term (current) drug therapy: Secondary | ICD-10-CM | POA: Insufficient documentation

## 2019-06-09 DIAGNOSIS — Z7982 Long term (current) use of aspirin: Secondary | ICD-10-CM | POA: Insufficient documentation

## 2019-06-09 DIAGNOSIS — K5090 Crohn's disease, unspecified, without complications: Secondary | ICD-10-CM | POA: Diagnosis not present

## 2019-06-09 DIAGNOSIS — Z87891 Personal history of nicotine dependence: Secondary | ICD-10-CM | POA: Diagnosis not present

## 2019-06-09 DIAGNOSIS — L97519 Non-pressure chronic ulcer of other part of right foot with unspecified severity: Secondary | ICD-10-CM | POA: Insufficient documentation

## 2019-06-09 DIAGNOSIS — I70235 Atherosclerosis of native arteries of right leg with ulceration of other part of foot: Secondary | ICD-10-CM | POA: Insufficient documentation

## 2019-06-09 DIAGNOSIS — Z86711 Personal history of pulmonary embolism: Secondary | ICD-10-CM | POA: Diagnosis not present

## 2019-06-09 DIAGNOSIS — E1151 Type 2 diabetes mellitus with diabetic peripheral angiopathy without gangrene: Secondary | ICD-10-CM | POA: Diagnosis not present

## 2019-06-09 DIAGNOSIS — Z86718 Personal history of other venous thrombosis and embolism: Secondary | ICD-10-CM | POA: Diagnosis not present

## 2019-06-09 DIAGNOSIS — Z881 Allergy status to other antibiotic agents status: Secondary | ICD-10-CM | POA: Diagnosis not present

## 2019-06-09 DIAGNOSIS — Z89512 Acquired absence of left leg below knee: Secondary | ICD-10-CM | POA: Diagnosis not present

## 2019-06-09 DIAGNOSIS — Z8249 Family history of ischemic heart disease and other diseases of the circulatory system: Secondary | ICD-10-CM | POA: Insufficient documentation

## 2019-06-09 DIAGNOSIS — E11621 Type 2 diabetes mellitus with foot ulcer: Secondary | ICD-10-CM | POA: Insufficient documentation

## 2019-06-09 DIAGNOSIS — M1990 Unspecified osteoarthritis, unspecified site: Secondary | ICD-10-CM | POA: Insufficient documentation

## 2019-06-09 HISTORY — PX: LOWER EXTREMITY ANGIOGRAPHY: CATH118251

## 2019-06-09 LAB — POCT I-STAT, CHEM 8
BUN: 17 mg/dL (ref 8–23)
Calcium, Ion: 1.28 mmol/L (ref 1.15–1.40)
Chloride: 103 mmol/L (ref 98–111)
Creatinine, Ser: 0.8 mg/dL (ref 0.61–1.24)
Glucose, Bld: 120 mg/dL — ABNORMAL HIGH (ref 70–99)
HCT: 38 % — ABNORMAL LOW (ref 39.0–52.0)
Hemoglobin: 12.9 g/dL — ABNORMAL LOW (ref 13.0–17.0)
Potassium: 3.6 mmol/L (ref 3.5–5.1)
Sodium: 141 mmol/L (ref 135–145)
TCO2: 26 mmol/L (ref 22–32)

## 2019-06-09 LAB — POCT ACTIVATED CLOTTING TIME: Activated Clotting Time: 219 seconds

## 2019-06-09 SURGERY — LOWER EXTREMITY ANGIOGRAPHY
Anesthesia: LOCAL

## 2019-06-09 MED ORDER — SODIUM CHLORIDE 0.9% FLUSH: 3.0000 mL | INTRAVENOUS | Status: DC | PRN

## 2019-06-09 MED ORDER — SODIUM CHLORIDE 0.9% FLUSH: 3.0000 mL | Freq: Two times a day (BID) | INTRAVENOUS | Status: DC

## 2019-06-09 MED ORDER — LIDOCAINE HCL (PF) 1 % IJ SOLN
INTRAMUSCULAR | Status: DC | PRN
  Administered 2019-06-09: 15 mL

## 2019-06-09 MED ORDER — HEPARIN SODIUM (PORCINE) 1000 UNIT/ML IJ SOLN
INTRAMUSCULAR | Status: AC
  Filled 2019-06-09: qty 1

## 2019-06-09 MED ORDER — SODIUM CHLORIDE 0.9 % IV SOLN: INTRAVENOUS | Status: DC

## 2019-06-09 MED ORDER — SODIUM CHLORIDE 0.9 % IV SOLN: 250.0000 mL | INTRAVENOUS | Status: DC | PRN

## 2019-06-09 MED ORDER — ONDANSETRON HCL 4 MG/2ML IJ SOLN: 4.0000 mg | Freq: Four times a day (QID) | INTRAMUSCULAR | Status: DC | PRN

## 2019-06-09 MED ORDER — IODIXANOL 320 MG/ML IV SOLN
INTRAVENOUS | Status: DC | PRN
  Administered 2019-06-09: 120 mL via INTRA_ARTERIAL

## 2019-06-09 MED ORDER — LABETALOL HCL 5 MG/ML IV SOLN: 10.0000 mg | INTRAVENOUS | Status: DC | PRN

## 2019-06-09 MED ORDER — HEPARIN (PORCINE) IN NACL 1000-0.9 UT/500ML-% IV SOLN
INTRAVENOUS | Status: DC | PRN
  Administered 2019-06-09: 500 mL

## 2019-06-09 MED ORDER — MIDAZOLAM HCL 2 MG/2ML IJ SOLN
INTRAMUSCULAR | Status: DC | PRN
  Administered 2019-06-09: 1 mg via INTRAVENOUS

## 2019-06-09 MED ORDER — FENTANYL CITRATE (PF) 100 MCG/2ML IJ SOLN
INTRAMUSCULAR | Status: DC | PRN
  Administered 2019-06-09: 25 ug via INTRAVENOUS

## 2019-06-09 MED ORDER — FENTANYL CITRATE (PF) 100 MCG/2ML IJ SOLN
INTRAMUSCULAR | Status: AC
  Filled 2019-06-09: qty 2

## 2019-06-09 MED ORDER — MIDAZOLAM HCL 2 MG/2ML IJ SOLN
INTRAMUSCULAR | Status: AC
  Filled 2019-06-09: qty 2

## 2019-06-09 MED ORDER — HEPARIN SODIUM (PORCINE) 1000 UNIT/ML IJ SOLN
INTRAMUSCULAR | Status: DC | PRN
  Administered 2019-06-09: 8000 [IU] via INTRAVENOUS
  Administered 2019-06-09: 2000 [IU] via INTRAVENOUS
  Administered 2019-06-09: 3000 [IU] via INTRAVENOUS

## 2019-06-09 MED ORDER — ACETAMINOPHEN 325 MG PO TABS: 650.0000 mg | ORAL_TABLET | ORAL | Status: DC | PRN

## 2019-06-09 MED ORDER — HYDRALAZINE HCL 20 MG/ML IJ SOLN: 5.0000 mg | INTRAMUSCULAR | Status: DC | PRN

## 2019-06-09 MED ORDER — SODIUM CHLORIDE 0.9 % IV SOLN
INTRAVENOUS | Status: DC
  Administered 2019-06-09: 09:00:00 via INTRAVENOUS

## 2019-06-09 MED ORDER — LIDOCAINE HCL (PF) 1 % IJ SOLN
INTRAMUSCULAR | Status: AC
  Filled 2019-06-09: qty 30

## 2019-06-09 MED ORDER — HEPARIN (PORCINE) IN NACL 1000-0.9 UT/500ML-% IV SOLN
INTRAVENOUS | Status: AC
  Filled 2019-06-09: qty 1000

## 2019-06-09 SURGICAL SUPPLY — 24 items
CATH CXI 2.3F 150 ANG (CATHETERS) ×1 IMPLANT
CATH CXI 2.3F 150 ST (CATHETERS) ×1 IMPLANT
CATH CXI SUPP 2.6F 150 ST (CATHETERS) ×1 IMPLANT
CATH OMNI FLUSH 5F 65CM (CATHETERS) ×1 IMPLANT
CATH TELEPORT (CATHETERS) ×1 IMPLANT
CLOSURE MYNX CONTROL 6F/7F (Vascular Products) ×1 IMPLANT
GUIDEWIRE ZILIENT 12G 014X300 (WIRE) ×2 IMPLANT
GUIDEWIRE ZILIENT 30G 014 (WIRE) ×1 IMPLANT
KIT ENCORE 26 ADVANTAGE (KITS) ×1 IMPLANT
KIT MICROPUNCTURE NIT STIFF (SHEATH) ×1 IMPLANT
KIT PV (KITS) ×2 IMPLANT
SHEATH HIGHFLEX ANSEL 6FRX55 (SHEATH) ×1 IMPLANT
SHEATH PINNACLE 5F 10CM (SHEATH) ×2 IMPLANT
SHEATH PINNACLE 6F 10CM (SHEATH) ×1 IMPLANT
SHEATH PROBE COVER 6X72 (BAG) ×1 IMPLANT
SYR MEDRAD MARK V 150ML (SYRINGE) ×1 IMPLANT
TRANSDUCER W/STOPCOCK (MISCELLANEOUS) ×2 IMPLANT
TRAY PV CATH (CUSTOM PROCEDURE TRAY) ×2 IMPLANT
WIRE BENTSON .035X145CM (WIRE) ×1 IMPLANT
WIRE G V18X300CM (WIRE) ×1 IMPLANT
WIRE HI TORQ WHISPER MS 300CM (WIRE) ×1 IMPLANT
WIRE ROSEN-J .035X180CM (WIRE) ×1 IMPLANT
WIRE RUNTHROUGH .014X300CM (WIRE) ×1 IMPLANT
WIRE SPARTACORE .014X300CM (WIRE) ×3 IMPLANT

## 2019-06-09 NOTE — Discharge Instructions (Signed)
Femoral Site Care This sheet gives you information about how to care for yourself after your procedure. Your health care provider may also give you more specific instructions. If you have problems or questions, contact your health care provider. What can I expect after the procedure? After the procedure, it is common to have:  Bruising that usually fades within 1-2 weeks.  Tenderness at the site. Follow these instructions at home: Wound care  Follow instructions from your health care provider about how to take care of your insertion site. Make sure you: ? Wash your hands with soap and water before you change your bandage (dressing). If soap and water are not available, use hand sanitizer. ? Change your dressing as told by your health care provider. ? Leave stitches (sutures), skin glue, or adhesive strips in place. These skin closures may need to stay in place for 2 weeks or longer. If adhesive strip edges start to loosen and curl up, you may trim the loose edges. Do not remove adhesive strips completely unless your health care provider tells you to do that.  Do not take baths, swim, or use a hot tub until your health care provider approves.  You may shower 24-48 hours after the procedure or as told by your health care provider. ? Gently wash the site with plain soap and water. ? Pat the area dry with a clean towel. ? Do not rub the site. This may cause bleeding.  Do not apply powder or lotion to the site. Keep the site clean and dry.  Check your femoral site every day for signs of infection. Check for: ? Redness, swelling, or pain. ? Fluid or blood. ? Warmth. ? Pus or a bad smell. Activity  For the first 2-3 days after your procedure, or as long as directed: ? Avoid climbing stairs as much as possible. ? Do not squat.  Do not lift anything that is heavier than 10 lb (4.5 kg), or the limit that you are told, until your health care provider says that it is safe.  Rest as  directed. ? Avoid sitting for a long time without moving. Get up to take short walks every 1-2 hours.  Do not drive for 24 hours if you were given a medicine to help you relax (sedative). General instructions  Take over-the-counter and prescription medicines only as told by your health care provider.  Keep all follow-up visits as told by your health care provider. This is important. Contact a health care provider if you have:  A fever or chills.  You have redness, swelling, or pain around your insertion site. Get help right away if:  The catheter insertion area swells very fast.  You pass out.  You suddenly start to sweat or your skin gets clammy.  The catheter insertion area is bleeding, and the bleeding does not stop when you hold steady pressure on the area.  The area near or just beyond the catheter insertion site becomes pale, cool, tingly, or numb. These symptoms may represent a serious problem that is an emergency. Do not wait to see if the symptoms will go away. Get medical help right away. Call your local emergency services (911 in the [place] Do not drive yourself to the hospital. Summary  After the procedure, it is common to have bruising that usually fades within 1-2 weeks.  Check your femoral site every day for signs of infection.  Do not lift anything that is heavier than 10 lb (4.5 kg), or the  limit that you are told, until your health care provider says that it is safe. This information is not intended to replace advice given to you by your health care provider. Make sure you discuss any questions you have with your health care provider. Document Released: 02/25/2014 Document Revised: 07/07/2017 Document Reviewed: 07/07/2017 Elsevier Patient Education  2020 [employer].

## 2019-06-09 NOTE — Op Note (Signed)
Patient name: Rodney Sullivan MRN: [id number] DOB: May 13, 1941 Sex: male  06/09/2019 Pre-operative Diagnosis: Nonhealing right toe wound Post-operative diagnosis:  Same Surgeon:  [name], MD Procedure Performed: 1.  Ultrasound-guided access of the left common femoral artery 2.  Aortogram 3.  Right lower extremity arteriogram with selection of third order branches including anterior tibial, peroneal and posterior tibial artery 4.  Unsuccessful antegrade recanalization of chronic total occlusion in the right posterior tibial artery 5.  99 minutes of monitored moderate conscious sedation time 6.  Mynx closure of the left common femoral artery  Indications: Patient is a 78 year old male well-known to the vascular surgery service.  He previous underwent left lower extremity intervention that failed and ultimately required left BKA.  He presents today for right lower extremity arteriogram and intervention with known severe tibial disease for nonhealing toe wound that has been present for over one year.  Risk and benefits were discussed with the patient.  Findings:   Aortogram showed single patent renal arteries bilaterally with no flow-limiting aortoiliac stenosis.  Patient has extremely sluggish flow on aortogram.  Right lower extremity arteriogram showed a patent common femoral profunda and SFA as well as above and below-knee popliteal artery.  Patient has severe tibial disease.  After cannulation of the anterior tibial it appears to occlude above the ankle with no runoff into the foot.  The peroneal also appears to occlude in the mid to distal calf with no runoff distally.  Posterior tibial has a short focal heavily calcified chronic total occlusion with collateral around it and ultimately a very diseased posterior tibial runoff into the ankle.  Attempted antegrade recanalization of the chronic total occlusion of the posterior tibial artery using a multitude of wires including V 18,  Spart[place] core, whisper and weighted zilient wires.  Ultimately when I thought I was across this antegrade we were actually extraluminal.  Ultimately he states his foot wound has been stable for over 1 year and he has no rest pain.  I elected to stop and may bring him back for retrograde tibial in the future.   Procedure:  The patient was identified in the holding area and taken to room 8.  The patient was then placed supine on the table and prepped and draped in the usual sterile fashion.  A time out was called.  Ultrasound was used to evaluate the left common femoral artery.  It was patent .  A digital ultrasound image was acquired.  A micropuncture needle was used to access the left common femoral artery under ultrasound guidance.  An 018 wire was advanced without resistance and a micropuncture sheath was placed.  The 018 wire was removed and a benson wire was placed.  The micropuncture sheath was exchanged for a 5 french sheath.  An omniflush catheter was advanced over the wire to the level of L-1.  An abdominal angiogram was obtained.  Next, using the omniflush catheter and a benson wire, the aortic bifurcation was crossed and the catheter was placed into theright external iliac artery and right runoff was obtained.  Ultimately had severe tibial disease as noted above.  In an effort to intervene used a Rosen wire and exchanged for a long 6 [place] Ansell sheath over the aortic bifurcation from the left groin.  Patient was given 100 units/kg heparin.  I then used a long CXI catheter with a V 18 wire and ultimately cannulated the posterior tibial and try to cross the short chronic total occlusion in  the mid posterior tibial artery.  Ultimately I could not get my wire to cross even with additional catheter support.  I tried downsizing to a smaller 014 system and a [place] core wire as well as a whisper wire and a number of different weighted zilient wires unsuccessful.  I then decided to individually cannulate the  peroneal and then the anterior tibial to further evaluate the runoff given very sluggish flow down the leg.  He had such sluggish flow it was hard to see runoff into the foot.  After cannulation of each of these arteries it appeared both peroneal and anterior tibial occluded well above the ankle and had no runoff into the foot.  I then elected to go back to the posterior tibial and finally with a 30 g weighted zilient wire and thought I had crossed antegrade across the posterior tibial occlusion but ultimately once we got a TelePort catheter down we were extraluminal.  Given that he states his foot wound has been stable for over one year I elected to stop.  The next consideration would be retrograde posterior tibial and I will see him in 3-4 month.  Wires and catheters were removed.  Short 6 french sheath was exchanged in the left common femoral artery.  Mynx closure device was deployed in the left groin.  Plan: Unsuccessful antegrade recanalization of chronic total occlusion in the posterior tibial.  This is only tibial that has runoff into the foot although very diseased distally and heavily calcified.  He states his toe wound is been stable for over 1 year and no rest pain.  Discussed with him and his wife the only option be to bring him back for attempted retrograde posterior tibial access.  I will see him again in 3 to 4 weeks to discuss.   [name], MD [facility] Office: [phone] Pager: [place]

## 2019-06-09 NOTE — H&P (Signed)
History and Physical Interval Note:  06/09/2019 9:54 AM  Rodney Sullivan  has presented today for surgery, with the diagnosis of pad.  The various methods of treatment have been discussed with the patient and family. After consideration of risks, benefits and other options for treatment, the patient has consented to  Procedure(s): LOWER EXTREMITY ANGIOGRAPHY (N/A) as a surgical intervention.  The patient's history has been reviewed, patient examined, no change in status, stable for surgery.  I have reviewed the patient's chart and labs.  Questions were answered to the patient's satisfaction.    Aortogram, right leg arteriogram, possible intervention.  Non-healing toe wound.  Rodney Sullivan  Patient name: Rodney Sullivan MRN: [id number]        DOB: Jun 02, 1941          Sex: male  REASON FOR VISIT: F/U after L BKA debridement  HPI: Rodney Sullivan is a 78 y.o. male that presents for wound check after left BKA debridement on 04/09/19.  He had a left BKA on 03/03/2019 for critical limb ischemia with tissue loss and had already undergone revascularization with left PT atherectomy.  Reports that this was healing accordingly until he fell after discharge.   Stump was debrided in the OR and has been in vac dressing with 3x weekly changes.  Wound continues to show improvement.  Still doing wound VAC at home with home health.  Patient also notes tissue loss of the right third toe.  States this has been present for a year.      Past Medical History:  Diagnosis Date  . Arthritis   . Bacteremia due to Escherichia coli 11/2013  . Cancer (HCC)    basal skin- arm, arm squamous  . Crohn disease (Rodney Sullivan)   . Diabetes mellitus without complication ([place])   . DM (diabetes mellitus), type 2 with peripheral vascular complications ([place]) 10/14/8117  . DVT (deep venous thrombosis) ([place]) 11/27/13  . Dyspnea    at times  . History of kidney stones    passed  . History of skin cancer    ARMS & FACE   . Peripheral arterial disease ([place]) 02/15/2019  . Pulmonary embolism ([place]) 11/27/13   . Shoulder problem    left torn ligament-          Past Surgical History:  Procedure Laterality Date  . ABDOMINAL AORTOGRAM N/A 12/14/2018   Procedure: ABDOMINAL AORTOGRAM;  Surgeon: Waynetta Sandy, MD;  Location: [facility];  Service: Cardiovascular;  Laterality: N/A;  . ABDOMINAL AORTOGRAM W/LOWER EXTREMITY Bilateral 02/17/2019   Procedure: ABDOMINAL AORTOGRAM W/LOWER EXTREMITY;  Surgeon: Rodney Heck, MD;  Location: [place] [facility];  Service: Cardiovascular;  Laterality: Bilateral;  . ABDOMINAL SURGERY     Bowel resection x2  . AMPUTATION Right 12/21/2018   Procedure: RIGHT LONG FINGER AMPUTATION;  Surgeon: Leanora Cover, MD;  Location: [place];  Service: Orthopedics;  Laterality: Right;  . AMPUTATION Left 03/03/2019   Procedure: AMPUTATION BELOW KNEE;  Surgeon: Rodney Heck, MD;  Location: [place];  Service: Vascular;  Laterality: Left;  . APPLICATION OF WOUND VAC Left 04/09/2019   Procedure: APPLICATION OF WOUND VAC;  Surgeon: Rodney Heck, MD;  Location: [facility] [facility];  Service: Vascular;  Laterality: Left;  . CHOLECYSTECTOMY    . COLON SURGERY    . HIP SURGERY Bilateral    replacement  . JOINT REPLACEMENT    . LOWER EXTREMITY ANGIOGRAPHY Right 12/14/2018   Procedure: LOWER EXTREMITY ANGIOGRAPHY;  Surgeon:  Waynetta Sandy, MD;  Location: [place] [facility];  Service: Cardiovascular;  Laterality: Right;  . PERIPHERAL VASCULAR ATHERECTOMY  02/17/2019   Procedure: PERIPHERAL VASCULAR ATHERECTOMY;  Surgeon: Rodney Heck, MD;  Location: [place];  Service: Cardiovascular;;  . UPPER EXTREMITY ANGIOGRAPHY Right 12/14/2018   Procedure: Right UPPER EXTREMITY ANGIOGRAPHY;  Surgeon: Waynetta Sandy, MD;  Location: [place] [facility];  Service: Cardiovascular;  Laterality: Right;  . WOUND DEBRIDEMENT Left  04/09/2019   Procedure: DEBRIDEMENT WOUND LEFT BELOW KNEE STUMP;  Surgeon: Rodney Heck, MD;  Location[facility];  Service: Vascular;  Laterality: Left;         Family History  Problem Relation Age of Onset  . Hypertension Mother     SOCIAL HISTORY: Social History        Tobacco Use  . Smoking status: Former Smoker    Years: 10.00    Types: Pipe  . Smokeless tobacco: Former Systems developer    Quit date: 12/08/1979  Substance Use Topics  . Alcohol use: No    Alcohol/week: 0.0 standard drinks         Allergies  Allergen Reactions  . Ciprofloxacin Other (See Comments)    tendons hurt  . Shrimp [Shellfish Allergy]           Current Outpatient Medications  Medication Sig Dispense Refill  . acetaminophen (TYLENOL) 500 MG tablet Take 500 mg by mouth every 6 (six) hours as needed.    Marland Kitchen albuterol (VENTOLIN HFA) 108 (90 Base) MCG/ACT inhaler Inhale 2 puffs into the lungs every 4 (four) hours as needed for wheezing or shortness of breath.     . allopurinol (ZYLOPRIM) 100 MG tablet Take 100 mg by mouth daily.    Marland Kitchen amLODipine (NORVASC) 2.5 MG tablet Take 2.5 mg by mouth daily with supper.     Marland Kitchen aspirin EC 81 MG tablet Take 81 mg by mouth daily with supper.     . benzonatate (TESSALON) 100 MG capsule Take 100 mg by mouth 3 (three) times daily as needed for cough.    . brimonidine-timolol (COMBIGAN) 0.2-0.5 % ophthalmic solution Place 1 drop into both eyes 2 (two) times a day.    . cefdinir (OMNICEF) 300 MG capsule     . Certolizumab Pegol (CIMZIA) 2 X 200 MG KIT Inject 400 mg into the skin every 28 (twenty-eight) days.    . Cholecalciferol (VITAMIN D) 50 MCG (2000 UT) tablet Take 2,000 Units by mouth daily.    . Cyanocobalamin (B-12) 2500 MCG TABS Take 2,500 mcg by mouth daily.    Marland Kitchen Dexamethasone Acetate 8 MG/ML SUSP Inject 8 mg into the muscle daily as needed (for Crohn's flare).    . Emollient (ZIMS CRACK CREME) CREA Apply 1 application  topically daily as needed (dry skin).     . famciclovir (FAMVIR) 250 MG tablet Take 250 mg by mouth 3 (three) times daily as needed (fungus on corner of mouth flare).     . ferrous sulfate 325 (65 FE) MG tablet Take 325 mg by mouth daily.    Marland Kitchen latanoprost (XALATAN) 0.005 % ophthalmic solution Place 1 drop into both eyes at bedtime.    . mesalamine (PENTASA) 500 MG CR capsule Take 2,000 mg by mouth 2 (two) times daily.    . Multiple Vitamin (MULTIVITAMIN WITH MINERALS) TABS Take 1 tablet by mouth daily.    . Omega-3 Fatty Acids (FISH OIL) 1000 MG CAPS Take 1,000 mg by mouth daily.    Marland Kitchen  OVER THE COUNTER MEDICATION Take 5 mLs by mouth every evening. CBD oil drops     . oxyCODONE (OXY IR/ROXICODONE) 5 MG immediate release tablet Take 1 tablet (5 mg total) by mouth every 6 (six) hours as needed for severe pain. 20 tablet 0  . pantoprazole (PROTONIX) 40 MG tablet Take 40 mg by mouth daily at 3 pm. In the afternoon    . pioglitazone (ACTOS) 15 MG tablet Take 15 mg by mouth daily.     . pravastatin (PRAVACHOL) 10 MG tablet Take 1 tablet (10 mg total) by mouth every evening. (Patient taking differently: Take 10 mg by mouth daily at 3 pm. In the afternoon.) 30 tablet 0  . predniSONE (DELTASONE) 10 MG tablet Take 20 mg by mouth daily after breakfast. Total daily dose=22.5 mg (take with 0.5 tablet 15m tablet)    . traMADol (ULTRAM) 50 MG tablet Take 1 tablet (50 mg total) by mouth at bedtime as needed for moderate pain. (Patient taking differently: Take 50 mg by mouth every 6 (six) hours as needed for moderate pain. )     No current facility-administered medications for this visit.     REVIEW OF SYSTEMS:  [X]  denotes positive finding, [ ]  denotes negative finding Cardiac  Comments:  Chest pain or chest pressure:    Shortness of breath upon exertion:    Short of breath when lying flat:    Irregular heart rhythm:        Vascular    Pain in calf, thigh, or hip  brought on by ambulation:    Pain in feet at night that wakes you up from your sleep:     Blood clot in your veins:    Leg swelling:         Pulmonary    Oxygen at home:    Productive cough:     Wheezing:         Neurologic    Sudden weakness in arms or legs:     Sudden numbness in arms or legs:     Sudden onset of difficulty speaking or slurred speech:    Temporary loss of vision in one eye:     Problems with dizziness:         Gastrointestinal    Blood in stool:     Vomited blood:         Genitourinary    Burning when urinating:     Blood in urine:        Psychiatric    Major depression:         Hematologic    Bleeding problems:    Problems with blood clotting too easily:        Skin    Rashes or ulcers:        Constitutional    Fever or chills:      PHYSICAL EXAM:    Vitals:   05/25/19 1106  BP: 108/65  Pulse: 79  Resp: 18  Temp: (!) 97.2 F (36.2 C)  SpO2: 97%  Weight: 170 lb (77.1 kg)  Height: 5' 7"  (1.702 m)    GENERAL: The patient is a well-nourished male, in no acute distress. The vital signs are documented above. CARDIAC: There is a regular rate and rhythm.  VASCULAR:  Left BKA as pictured below with wound healing as pictured below. Right 3rd toe with dry ulcer            DATA:   Reviewed old arteriogram pictures from Dr.  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  PT appears to be dominant flow with multiple high-grade focal stenosis.  Assessment/Plan:  78 year old male status post left BKA on 03/03/19 for CLI with tissue loss that presented post-op after fall on his BKA.  He recently underwent debridement of the left BKA on 04/09/2019.  The wound is healing really nicely.  Continue home health VAC.  As a relates to his right lower extremity he has a third toe wound that has been nonhealing.  He  previously had a right lower extremity arteriogram with Dr. Donzetta Matters earlier this year that shows severe tibial disease.  I have recommended aortogram right lower extremity arteriogram with intervention that we will schedule week after Thanksgiving.  Rodney Heck, MD [facility] Office: [phone] Pager: [phone]

## 2019-06-11 ENCOUNTER — Encounter (HOSPITAL_COMMUNITY): Payer: Medicare HMO

## 2019-06-11 ENCOUNTER — Ambulatory Visit: Payer: Medicare HMO | Admitting: Family

## 2019-06-18 ENCOUNTER — Ambulatory Visit (HOSPITAL_COMMUNITY): Admission: EM | Admit: 2019-06-18 | Payer: Medicare HMO | Admitting: Cardiology

## 2019-06-18 ENCOUNTER — Inpatient Hospital Stay (HOSPITAL_COMMUNITY)
Admission: EM | Disposition: A | Discharge status: Home Health | Payer: Self-pay | Source: Home / Self Care | Attending: Cardiology

## 2019-06-18 ENCOUNTER — Inpatient Hospital Stay (HOSPITAL_COMMUNITY)
Admission: EM | Admit: 2019-06-18 | Discharge: 2019-06-25 | DRG: 246 | Disposition: A | Discharge status: Home Health | Payer: Medicare HMO | Attending: Cardiology | Admitting: Cardiology

## 2019-06-18 ENCOUNTER — Encounter (HOSPITAL_COMMUNITY): Payer: Self-pay | Admitting: Cardiology

## 2019-06-18 DIAGNOSIS — R079 Chest pain, unspecified: Secondary | ICD-10-CM

## 2019-06-18 DIAGNOSIS — I442 Atrioventricular block, complete: Secondary | ICD-10-CM | POA: Diagnosis present

## 2019-06-18 DIAGNOSIS — Z66 Do not resuscitate: Secondary | ICD-10-CM

## 2019-06-18 DIAGNOSIS — E11621 Type 2 diabetes mellitus with foot ulcer: Secondary | ICD-10-CM | POA: Diagnosis present

## 2019-06-18 DIAGNOSIS — E876 Hypokalemia: Secondary | ICD-10-CM | POA: Diagnosis present

## 2019-06-18 DIAGNOSIS — Z515 Encounter for palliative care: Secondary | ICD-10-CM | POA: Diagnosis not present

## 2019-06-18 DIAGNOSIS — I313 Pericardial effusion (noninflammatory): Secondary | ICD-10-CM | POA: Diagnosis not present

## 2019-06-18 DIAGNOSIS — Z955 Presence of coronary angioplasty implant and graft: Secondary | ICD-10-CM

## 2019-06-18 DIAGNOSIS — E669 Obesity, unspecified: Secondary | ICD-10-CM | POA: Diagnosis present

## 2019-06-18 DIAGNOSIS — I998 Other disorder of circulatory system: Secondary | ICD-10-CM

## 2019-06-18 DIAGNOSIS — E1165 Type 2 diabetes mellitus with hyperglycemia: Secondary | ICD-10-CM | POA: Diagnosis present

## 2019-06-18 DIAGNOSIS — Z89512 Acquired absence of left leg below knee: Secondary | ICD-10-CM

## 2019-06-18 DIAGNOSIS — I4510 Unspecified right bundle-branch block: Secondary | ICD-10-CM

## 2019-06-18 DIAGNOSIS — Q210 Ventricular septal defect: Secondary | ICD-10-CM

## 2019-06-18 DIAGNOSIS — Z86711 Personal history of pulmonary embolism: Secondary | ICD-10-CM | POA: Diagnosis not present

## 2019-06-18 DIAGNOSIS — Z7189 Other specified counseling: Secondary | ICD-10-CM | POA: Diagnosis not present

## 2019-06-18 DIAGNOSIS — I2510 Atherosclerotic heart disease of native coronary artery without angina pectoris: Secondary | ICD-10-CM

## 2019-06-18 DIAGNOSIS — I213 ST elevation (STEMI) myocardial infarction of unspecified site: Secondary | ICD-10-CM | POA: Diagnosis present

## 2019-06-18 DIAGNOSIS — I110 Hypertensive heart disease with heart failure: Secondary | ICD-10-CM | POA: Diagnosis present

## 2019-06-18 DIAGNOSIS — L97519 Non-pressure chronic ulcer of other part of right foot with unspecified severity: Secondary | ICD-10-CM | POA: Diagnosis present

## 2019-06-18 DIAGNOSIS — Z7952 Long term (current) use of systemic steroids: Secondary | ICD-10-CM

## 2019-06-18 DIAGNOSIS — E1151 Type 2 diabetes mellitus with diabetic peripheral angiopathy without gangrene: Secondary | ICD-10-CM | POA: Diagnosis present

## 2019-06-18 DIAGNOSIS — K5090 Crohn's disease, unspecified, without complications: Secondary | ICD-10-CM | POA: Diagnosis present

## 2019-06-18 DIAGNOSIS — Z87891 Personal history of nicotine dependence: Secondary | ICD-10-CM

## 2019-06-18 DIAGNOSIS — Z20828 Contact with and (suspected) exposure to other viral communicable diseases: Secondary | ICD-10-CM | POA: Diagnosis present

## 2019-06-18 DIAGNOSIS — I5021 Acute systolic (congestive) heart failure: Secondary | ICD-10-CM | POA: Diagnosis present

## 2019-06-18 DIAGNOSIS — E785 Hyperlipidemia, unspecified: Secondary | ICD-10-CM | POA: Diagnosis present

## 2019-06-18 DIAGNOSIS — I255 Ischemic cardiomyopathy: Secondary | ICD-10-CM | POA: Diagnosis present

## 2019-06-18 DIAGNOSIS — Z950 Presence of cardiac pacemaker: Secondary | ICD-10-CM

## 2019-06-18 DIAGNOSIS — Z79899 Other long term (current) drug therapy: Secondary | ICD-10-CM

## 2019-06-18 DIAGNOSIS — Z6826 Body mass index (BMI) 26.0-26.9, adult: Secondary | ICD-10-CM

## 2019-06-18 DIAGNOSIS — I232 Ventricular septal defect as current complication following acute myocardial infarction: Secondary | ICD-10-CM | POA: Diagnosis not present

## 2019-06-18 DIAGNOSIS — R570 Cardiogenic shock: Secondary | ICD-10-CM | POA: Diagnosis present

## 2019-06-18 DIAGNOSIS — I739 Peripheral vascular disease, unspecified: Secondary | ICD-10-CM | POA: Diagnosis not present

## 2019-06-18 DIAGNOSIS — I2102 ST elevation (STEMI) myocardial infarction involving left anterior descending coronary artery: Principal | ICD-10-CM | POA: Diagnosis present

## 2019-06-18 DIAGNOSIS — Z86718 Personal history of other venous thrombosis and embolism: Secondary | ICD-10-CM

## 2019-06-18 DIAGNOSIS — Z7982 Long term (current) use of aspirin: Secondary | ICD-10-CM

## 2019-06-18 DIAGNOSIS — T8789 Other complications of amputation stump: Secondary | ICD-10-CM | POA: Diagnosis not present

## 2019-06-18 HISTORY — PX: CORONARY STENT INTERVENTION: CATH118234

## 2019-06-18 HISTORY — PX: LEFT HEART CATH AND CORONARY ANGIOGRAPHY: CATH118249

## 2019-06-18 HISTORY — DX: ST elevation (STEMI) myocardial infarction of unspecified site: I21.3

## 2019-06-18 HISTORY — PX: CORONARY/GRAFT ACUTE MI REVASCULARIZATION: CATH118305

## 2019-06-18 LAB — COMPREHENSIVE METABOLIC PANEL
ALT: 54 U/L — ABNORMAL HIGH (ref 0–44)
ALT: 78 U/L — ABNORMAL HIGH (ref 0–44)
AST: 236 U/L — ABNORMAL HIGH (ref 15–41)
AST: 490 U/L — ABNORMAL HIGH (ref 15–41)
Albumin: 2.8 g/dL — ABNORMAL LOW (ref 3.5–5.0)
Albumin: 3 g/dL — ABNORMAL LOW (ref 3.5–5.0)
Alkaline Phosphatase: 45 U/L (ref 38–126)
Alkaline Phosphatase: 50 U/L (ref 38–126)
Anion gap: 14 (ref 5–15)
Anion gap: 14 (ref 5–15)
BUN: 8 mg/dL (ref 8–23)
BUN: 8 mg/dL (ref 8–23)
CO2: 20 mmol/L — ABNORMAL LOW (ref 22–32)
CO2: 23 mmol/L (ref 22–32)
Calcium: 8.4 mg/dL — ABNORMAL LOW (ref 8.9–10.3)
Calcium: 8.8 mg/dL — ABNORMAL LOW (ref 8.9–10.3)
Chloride: 103 mmol/L (ref 98–111)
Chloride: 103 mmol/L (ref 98–111)
Creatinine, Ser: 0.84 mg/dL (ref 0.61–1.24)
Creatinine, Ser: 0.78 mg/dL (ref 0.61–1.24)
GFR calc Af Amer: >60 mL/min (ref >60)
GFR calc Af Amer: >60 mL/min (ref >60)
GFR calc non Af Amer: >60 mL/min (ref >60)
GFR calc non Af Amer: >60 mL/min (ref >60)
Glucose, Bld: 153 mg/dL — ABNORMAL HIGH (ref 70–99)
Glucose, Bld: 140 mg/dL — ABNORMAL HIGH (ref 70–99)
Potassium: 4.3 mmol/L (ref 3.5–5.1)
Potassium: 4.5 mmol/L (ref 3.5–5.1)
Sodium: 137 mmol/L (ref 135–145)
Sodium: 140 mmol/L (ref 135–145)
Total Bilirubin: 0.8 mg/dL (ref 0.3–1.2)
Total Bilirubin: 0.7 mg/dL (ref 0.3–1.2)
Total Protein: 5.2 g/dL — ABNORMAL LOW (ref 6.5–8.1)
Total Protein: 5.3 g/dL — ABNORMAL LOW (ref 6.5–8.1)

## 2019-06-18 LAB — PROTIME-INR
INR: 1.4 — ABNORMAL HIGH (ref 0.8–1.2)
Prothrombin Time: 17.3 seconds — ABNORMAL HIGH (ref 11.4–15.2)

## 2019-06-18 LAB — CBC WITH DIFFERENTIAL/PLATELET
Abs Immature Granulocytes: 0.16 10*3/uL — ABNORMAL HIGH (ref 0.00–0.07)
Basophils Absolute: 0 10*3/uL (ref 0.0–0.1)
Basophils Relative: 0 %
Eosinophils Absolute: 0 10*3/uL (ref 0.0–0.5)
Eosinophils Relative: 0 %
HCT: 38.1 % — ABNORMAL LOW (ref 39.0–52.0)
Hemoglobin: 11.8 g/dL — ABNORMAL LOW (ref 13.0–17.0)
Immature Granulocytes: 1 %
Lymphocytes Relative: 23 %
Lymphs Abs: 2.9 10*3/uL (ref 0.7–4.0)
MCH: 25.8 pg — ABNORMAL LOW (ref 26.0–34.0)
MCHC: 31 g/dL (ref 30.0–36.0)
MCV: 83.2 fL (ref 80.0–100.0)
Monocytes Absolute: 1.2 10*3/uL — ABNORMAL HIGH (ref 0.1–1.0)
Monocytes Relative: 9 %
Neutro Abs: 8.4 10*3/uL — ABNORMAL HIGH (ref 1.7–7.7)
Neutrophils Relative %: 67 %
Platelets: 283 10*3/uL (ref 150–400)
RBC: 4.58 MIL/uL (ref 4.22–5.81)
RDW: 17 % — ABNORMAL HIGH (ref 11.5–15.5)
WBC: 12.7 10*3/uL — ABNORMAL HIGH (ref 4.0–10.5)
nRBC: 0.2 % (ref 0.0–0.2)

## 2019-06-18 LAB — GLUCOSE, CAPILLARY: Glucose-Capillary: 113 mg/dL — ABNORMAL HIGH (ref 70–99)

## 2019-06-18 LAB — CBC
HCT: 34.9 % — ABNORMAL LOW (ref 39.0–52.0)
Hemoglobin: 11 g/dL — ABNORMAL LOW (ref 13.0–17.0)
MCH: 26.1 pg (ref 26.0–34.0)
MCHC: 31.5 g/dL (ref 30.0–36.0)
MCV: 82.7 fL (ref 80.0–100.0)
Platelets: 262 10*3/uL (ref 150–400)
RBC: 4.22 MIL/uL (ref 4.22–5.81)
RDW: 16.7 % — ABNORMAL HIGH (ref 11.5–15.5)
WBC: 11.4 10*3/uL — ABNORMAL HIGH (ref 4.0–10.5)
nRBC: 0 % (ref 0.0–0.2)

## 2019-06-18 LAB — HEMOGLOBIN A1C
Hgb A1c MFr Bld: 7.7 % — ABNORMAL HIGH (ref 4.8–5.6)
Hgb A1c MFr Bld: 6.9 % — ABNORMAL HIGH (ref 4.8–5.6)
Mean Plasma Glucose: 174.29 mg/dL
Mean Plasma Glucose: 151.33 mg/dL

## 2019-06-18 LAB — POCT ACTIVATED CLOTTING TIME
Activated Clotting Time: 252 seconds
Activated Clotting Time: 301 seconds
Activated Clotting Time: 274 seconds
Activated Clotting Time: 279 seconds
Activated Clotting Time: 153 seconds

## 2019-06-18 LAB — MRSA PCR SCREENING: MRSA by PCR: NEGATIVE

## 2019-06-18 LAB — TROPONIN I (HIGH SENSITIVITY)
Troponin I (High Sensitivity): >27000 ng/L (ref <18)
Troponin I (High Sensitivity): >27000 ng/L (ref <18)
Troponin I (High Sensitivity): >27000 ng/L (ref <18)

## 2019-06-18 LAB — LIPID PANEL
Cholesterol: 142 mg/dL (ref 0–200)
HDL: 42 mg/dL (ref >40)
LDL Cholesterol: 78 mg/dL (ref 0–99)
Total CHOL/HDL Ratio: 3.4 RATIO
Triglycerides: 109 mg/dL (ref <150)
VLDL: 22 mg/dL (ref 0–40)

## 2019-06-18 LAB — TSH: TSH: 1.282 u[IU]/mL (ref 0.350–4.500)

## 2019-06-18 LAB — RESPIRATORY PANEL BY RT PCR (FLU A&B, COVID)
Influenza A by PCR: NEGATIVE
Influenza B by PCR: NEGATIVE
SARS Coronavirus 2 by RT PCR: NEGATIVE

## 2019-06-18 LAB — APTT: aPTT: >200 seconds (ref 24–36)

## 2019-06-18 SURGERY — CORONARY/GRAFT ACUTE MI REVASCULARIZATION
Anesthesia: LOCAL

## 2019-06-18 MED ORDER — TICAGRELOR 90 MG PO TABS
ORAL_TABLET | ORAL | Status: AC
  Filled 2019-06-18: qty 2

## 2019-06-18 MED ORDER — OMEGA-3-ACID ETHYL ESTERS 1 G PO CAPS
1.0000 g | ORAL_CAPSULE | Freq: Every day | ORAL | Status: DC
  Administered 2019-06-19 – 2019-06-25 (×7): 1 g via ORAL
  Filled 2019-06-18 (×7): qty 1

## 2019-06-18 MED ORDER — LIDOCAINE HCL (PF) 1 % IJ SOLN
INTRAMUSCULAR | Status: DC | PRN
  Administered 2019-06-18: 15 mL

## 2019-06-18 MED ORDER — INSULIN ASPART 100 UNIT/ML ~~LOC~~ SOLN
0.0000 [IU] | Freq: Three times a day (TID) | SUBCUTANEOUS | Status: DC
  Administered 2019-06-19: 1 [IU] via SUBCUTANEOUS
  Administered 2019-06-19: 2 [IU] via SUBCUTANEOUS
  Administered 2019-06-19 – 2019-06-20 (×2): 3 [IU] via SUBCUTANEOUS
  Administered 2019-06-20 – 2019-06-21 (×3): 2 [IU] via SUBCUTANEOUS
  Administered 2019-06-21: 1 [IU] via SUBCUTANEOUS
  Administered 2019-06-22: 3 [IU] via SUBCUTANEOUS
  Administered 2019-06-22: 1 [IU] via SUBCUTANEOUS
  Administered 2019-06-23: 5 [IU] via SUBCUTANEOUS
  Administered 2019-06-24: 1 [IU] via SUBCUTANEOUS
  Administered 2019-06-24 – 2019-06-25 (×2): 5 [IU] via SUBCUTANEOUS

## 2019-06-18 MED ORDER — PANTOPRAZOLE SODIUM 40 MG PO TBEC
40.0000 mg | DELAYED_RELEASE_TABLET | Freq: Every day | ORAL | Status: DC
  Administered 2019-06-19 – 2019-06-24 (×5): 40 mg via ORAL
  Filled 2019-06-18 (×6): qty 1

## 2019-06-18 MED ORDER — SODIUM CHLORIDE 0.9% FLUSH: 3.0000 mL | INTRAVENOUS | Status: DC | PRN

## 2019-06-18 MED ORDER — NITROGLYCERIN IN D5W 200-5 MCG/ML-% IV SOLN
5.0000 ug/min | INTRAVENOUS | Status: DC
  Administered 2019-06-18: 5 ug/min via INTRAVENOUS

## 2019-06-18 MED ORDER — ATORVASTATIN CALCIUM 80 MG PO TABS
80.0000 mg | ORAL_TABLET | Freq: Every day | ORAL | Status: DC
  Administered 2019-06-19 – 2019-06-22 (×3): 80 mg via ORAL
  Filled 2019-06-18 (×3): qty 1

## 2019-06-18 MED ORDER — ADULT MULTIVITAMIN W/MINERALS CH
1.0000 | ORAL_TABLET | Freq: Every day | ORAL | Status: DC
  Administered 2019-06-19 – 2019-06-25 (×7): 1 via ORAL
  Filled 2019-06-18 (×7): qty 1

## 2019-06-18 MED ORDER — PREDNISONE 20 MG PO TABS
22.5000 mg | ORAL_TABLET | Freq: Every day | ORAL | Status: DC
  Administered 2019-06-19 – 2019-06-25 (×7): 22.5 mg via ORAL
  Filled 2019-06-18 (×8): qty 1

## 2019-06-18 MED ORDER — VITAMIN B-12 1000 MCG PO TABS
2000.0000 ug | ORAL_TABLET | Freq: Every day | ORAL | Status: DC
  Administered 2019-06-19 – 2019-06-25 (×6): 2000 ug via ORAL
  Filled 2019-06-18 (×7): qty 2

## 2019-06-18 MED ORDER — ACETAMINOPHEN 325 MG PO TABS: 650.0000 mg | ORAL_TABLET | ORAL | Status: DC | PRN

## 2019-06-18 MED ORDER — INSULIN ASPART 100 UNIT/ML ~~LOC~~ SOLN
0.0000 [IU] | Freq: Every day | SUBCUTANEOUS | Status: DC
  Administered 2019-06-20 – 2019-06-24 (×3): 2 [IU] via SUBCUTANEOUS

## 2019-06-18 MED ORDER — HEPARIN (PORCINE) IN NACL 1000-0.9 UT/500ML-% IV SOLN
INTRAVENOUS | Status: DC | PRN
  Administered 2019-06-18: 500 mL

## 2019-06-18 MED ORDER — SODIUM CHLORIDE 0.9% FLUSH
3.0000 mL | Freq: Two times a day (BID) | INTRAVENOUS | Status: DC
  Administered 2019-06-19 – 2019-06-24 (×10): 3 mL via INTRAVENOUS

## 2019-06-18 MED ORDER — NITROGLYCERIN 0.4 MG SL SUBL: 0.4000 mg | SUBLINGUAL_TABLET | SUBLINGUAL | Status: DC | PRN

## 2019-06-18 MED ORDER — PRAVASTATIN SODIUM 10 MG PO TABS: 10.0000 mg | ORAL_TABLET | Freq: Every day | ORAL | Status: DC

## 2019-06-18 MED ORDER — SODIUM CHLORIDE 0.9 % WEIGHT BASED INFUSION
1.0000 mL/kg/h | INTRAVENOUS | Status: AC
  Administered 2019-06-18: 1 mL/kg/h via INTRAVENOUS

## 2019-06-18 MED ORDER — FERROUS SULFATE 325 (65 FE) MG PO TABS
325.0000 mg | ORAL_TABLET | Freq: Every day | ORAL | Status: DC
  Administered 2019-06-19 – 2019-06-25 (×7): 325 mg via ORAL
  Filled 2019-06-18 (×7): qty 1

## 2019-06-18 MED ORDER — VERAPAMIL HCL 2.5 MG/ML IV SOLN
INTRAVENOUS | Status: AC
  Filled 2019-06-18: qty 2

## 2019-06-18 MED ORDER — SODIUM CHLORIDE 0.9 % IV SOLN: 250.0000 mL | INTRAVENOUS | Status: DC | PRN

## 2019-06-18 MED ORDER — TICAGRELOR 90 MG PO TABS
ORAL_TABLET | ORAL | Status: DC | PRN
  Administered 2019-06-18: 180 mg via ORAL

## 2019-06-18 MED ORDER — PIOGLITAZONE HCL 15 MG PO TABS
15.0000 mg | ORAL_TABLET | Freq: Every day | ORAL | Status: DC
  Administered 2019-06-19 – 2019-06-20 (×2): 15 mg via ORAL
  Filled 2019-06-18 (×2): qty 1

## 2019-06-18 MED ORDER — LOSARTAN POTASSIUM 25 MG PO TABS
12.5000 mg | ORAL_TABLET | Freq: Every day | ORAL | Status: DC
  Administered 2019-06-18 – 2019-06-25 (×8): 12.5 mg via ORAL
  Filled 2019-06-18 (×8): qty 1

## 2019-06-18 MED ORDER — ALBUTEROL SULFATE (2.5 MG/3ML) 0.083% IN NEBU: 3.0000 mL | INHALATION_SOLUTION | RESPIRATORY_TRACT | Status: DC | PRN

## 2019-06-18 MED ORDER — LATANOPROST 0.005 % OP SOLN
1.0000 [drp] | Freq: Every day | OPHTHALMIC | Status: DC
  Administered 2019-06-18 – 2019-06-24 (×7): 1 [drp] via OPHTHALMIC
  Filled 2019-06-18: qty 2.5

## 2019-06-18 MED ORDER — NITROGLYCERIN 1 MG/10 ML FOR IR/CATH LAB
INTRA_ARTERIAL | Status: AC
  Filled 2019-06-18: qty 10

## 2019-06-18 MED ORDER — VERAPAMIL HCL 2.5 MG/ML IV SOLN
INTRAVENOUS | Status: DC | PRN
  Administered 2019-06-18 (×4): 200 ug via INTRACORONARY

## 2019-06-18 MED ORDER — ZOLPIDEM TARTRATE 5 MG PO TABS: 5.0000 mg | ORAL_TABLET | Freq: Every evening | ORAL | Status: DC | PRN

## 2019-06-18 MED ORDER — ADENOSINE (DIAGNOSTIC) FOR INTRACORONARY USE
INTRAVENOUS | Status: DC | PRN
  Administered 2019-06-18: 24 ug via INTRACORONARY
  Administered 2019-06-18: 12 ug via INTRACORONARY
  Administered 2019-06-18: 24 ug via INTRACORONARY

## 2019-06-18 MED ORDER — ASPIRIN 81 MG PO CHEW: 81.0000 mg | CHEWABLE_TABLET | Freq: Every day | ORAL | Status: DC

## 2019-06-18 MED ORDER — ATROPINE SULFATE 1 MG/10ML IJ SOSY
PREFILLED_SYRINGE | INTRAMUSCULAR | Status: AC
  Filled 2019-06-18: qty 10

## 2019-06-18 MED ORDER — PREDNISONE 20 MG PO TABS: 20.0000 mg | ORAL_TABLET | Freq: Every day | ORAL | Status: DC

## 2019-06-18 MED ORDER — BRIMONIDINE TARTRATE-TIMOLOL 0.2-0.5 % OP SOLN
1.0000 [drp] | Freq: Two times a day (BID) | OPHTHALMIC | Status: DC
  Filled 2019-06-18 (×2): qty 5

## 2019-06-18 MED ORDER — SODIUM CHLORIDE 0.9 % IV SOLN
INTRAVENOUS | Status: AC | PRN
  Administered 2019-06-18: 20 mL/h via INTRAVENOUS

## 2019-06-18 MED ORDER — TICAGRELOR 90 MG PO TABS
90.0000 mg | ORAL_TABLET | Freq: Two times a day (BID) | ORAL | Status: DC
  Administered 2019-06-19 – 2019-06-25 (×13): 90 mg via ORAL
  Filled 2019-06-18 (×14): qty 1

## 2019-06-18 MED ORDER — TIMOLOL MALEATE 0.5 % OP SOLN
1.0000 [drp] | Freq: Two times a day (BID) | OPHTHALMIC | Status: DC
  Administered 2019-06-18 – 2019-06-25 (×13): 1 [drp] via OPHTHALMIC
  Filled 2019-06-18: qty 5

## 2019-06-18 MED ORDER — ONDANSETRON HCL 4 MG/2ML IJ SOLN
4.0000 mg | Freq: Four times a day (QID) | INTRAMUSCULAR | Status: DC | PRN
  Administered 2019-06-19: 4 mg via INTRAVENOUS
  Filled 2019-06-18: qty 2

## 2019-06-18 MED ORDER — ADENOSINE 12 MG/4ML IV SOLN
INTRAVENOUS | Status: AC
  Filled 2019-06-18: qty 8

## 2019-06-18 MED ORDER — NITROGLYCERIN IN D5W 200-5 MCG/ML-% IV SOLN
INTRAVENOUS | Status: AC | PRN
  Administered 2019-06-18: 5 ug/min via INTRAVENOUS

## 2019-06-18 MED ORDER — ALPRAZOLAM 0.25 MG PO TABS
0.2500 mg | ORAL_TABLET | Freq: Two times a day (BID) | ORAL | Status: DC | PRN
  Administered 2019-06-20 – 2019-06-21 (×3): 0.25 mg via ORAL
  Filled 2019-06-18 (×3): qty 1

## 2019-06-18 MED ORDER — ENOXAPARIN SODIUM 40 MG/0.4ML ~~LOC~~ SOLN
40.0000 mg | SUBCUTANEOUS | Status: DC
  Administered 2019-06-19 – 2019-06-21 (×3): 40 mg via SUBCUTANEOUS
  Filled 2019-06-18 (×3): qty 0.4

## 2019-06-18 MED ORDER — HEPARIN (PORCINE) IN NACL 1000-0.9 UT/500ML-% IV SOLN
INTRAVENOUS | Status: AC
  Filled 2019-06-18: qty 500

## 2019-06-18 MED ORDER — HEPARIN SODIUM (PORCINE) 1000 UNIT/ML IJ SOLN
INTRAMUSCULAR | Status: DC | PRN
  Administered 2019-06-18: 7000 [IU] via INTRAVENOUS
  Administered 2019-06-18 (×2): 3000 [IU] via INTRAVENOUS

## 2019-06-18 MED ORDER — FISH OIL 1000 MG PO CAPS: 1000.0000 mg | ORAL_CAPSULE | Freq: Every day | ORAL | Status: DC

## 2019-06-18 MED ORDER — LORAZEPAM 0.5 MG PO TABS
0.5000 mg | ORAL_TABLET | Freq: Every day | ORAL | Status: DC | PRN
  Administered 2019-06-20: 0.5 mg via ORAL
  Filled 2019-06-18: qty 1

## 2019-06-18 MED ORDER — AMLODIPINE BESYLATE 2.5 MG PO TABS: 2.5000 mg | ORAL_TABLET | Freq: Every day | ORAL | Status: DC

## 2019-06-18 MED ORDER — ALLOPURINOL 100 MG PO TABS
100.0000 mg | ORAL_TABLET | Freq: Every day | ORAL | Status: DC
  Administered 2019-06-19 – 2019-06-25 (×7): 100 mg via ORAL
  Filled 2019-06-18 (×7): qty 1

## 2019-06-18 MED ORDER — PREDNISONE 2.5 MG PO TABS: 2.5000 mg | ORAL_TABLET | Freq: Every day | ORAL | Status: DC

## 2019-06-18 MED ORDER — ASPIRIN EC 81 MG PO TBEC
81.0000 mg | DELAYED_RELEASE_TABLET | Freq: Every day | ORAL | Status: DC
  Administered 2019-06-19 – 2019-06-24 (×5): 81 mg via ORAL
  Filled 2019-06-18 (×5): qty 1

## 2019-06-18 MED ORDER — IOHEXOL 350 MG/ML SOLN
INTRAVENOUS | Status: DC | PRN
  Administered 2019-06-18: 18:00:00 220 mL via INTRA_ARTERIAL

## 2019-06-18 MED ORDER — NITROGLYCERIN IN D5W 200-5 MCG/ML-% IV SOLN
INTRAVENOUS | Status: AC
  Filled 2019-06-18: qty 250

## 2019-06-18 MED ORDER — MESALAMINE ER 250 MG PO CPCR
2000.0000 mg | ORAL_CAPSULE | Freq: Two times a day (BID) | ORAL | Status: DC
  Administered 2019-06-18 – 2019-06-25 (×14): 2000 mg via ORAL
  Filled 2019-06-18 (×15): qty 8

## 2019-06-18 MED ORDER — HEPARIN SODIUM (PORCINE) 1000 UNIT/ML IJ SOLN
INTRAMUSCULAR | Status: AC
  Filled 2019-06-18: qty 1

## 2019-06-18 MED ORDER — BENZONATATE 100 MG PO CAPS: 100.0000 mg | ORAL_CAPSULE | Freq: Three times a day (TID) | ORAL | Status: DC | PRN

## 2019-06-18 MED ORDER — VITAMIN D 25 MCG (1000 UNIT) PO TABS
2000.0000 [IU] | ORAL_TABLET | Freq: Every day | ORAL | Status: DC
  Administered 2019-06-19 – 2019-06-25 (×7): 2000 [IU] via ORAL
  Filled 2019-06-18 (×7): qty 2

## 2019-06-18 MED ORDER — BRIMONIDINE TARTRATE 0.2 % OP SOLN
1.0000 [drp] | Freq: Two times a day (BID) | OPHTHALMIC | Status: DC
  Administered 2019-06-18 – 2019-06-25 (×13): 1 [drp] via OPHTHALMIC
  Filled 2019-06-18: qty 5

## 2019-06-18 MED ORDER — ONDANSETRON HCL 4 MG/2ML IJ SOLN: 4.0000 mg | Freq: Four times a day (QID) | INTRAMUSCULAR | Status: DC | PRN

## 2019-06-18 MED ORDER — NITROGLYCERIN 1 MG/10 ML FOR IR/CATH LAB
INTRA_ARTERIAL | Status: DC | PRN
  Administered 2019-06-18 (×4): 200 ug via INTRACORONARY

## 2019-06-18 MED ORDER — TRAMADOL HCL 50 MG PO TABS
50.0000 mg | ORAL_TABLET | Freq: Four times a day (QID) | ORAL | Status: DC | PRN
  Administered 2019-06-23 – 2019-06-24 (×2): 50 mg via ORAL
  Filled 2019-06-18 (×2): qty 1

## 2019-06-18 MED ORDER — ENOXAPARIN SODIUM 80 MG/0.8ML ~~LOC~~ SOLN: 1.0000 mg/kg | Freq: Two times a day (BID) | SUBCUTANEOUS | Status: DC

## 2019-06-18 MED ORDER — CARVEDILOL 3.125 MG PO TABS
3.1250 mg | ORAL_TABLET | Freq: Two times a day (BID) | ORAL | Status: DC
  Administered 2019-06-19: 3.125 mg via ORAL
  Filled 2019-06-18: qty 1

## 2019-06-18 SURGICAL SUPPLY — 23 items
BALLN SAPPHIRE 2.5X15 (BALLOONS) ×2
BALLOON SAPPHIRE 2.5X15 (BALLOONS) ×1 IMPLANT
CATH INFINITI 5FR ANG PIGTAIL (CATHETERS) ×2 IMPLANT
CATH INFINITI JR4 5F (CATHETERS) ×2 IMPLANT
CATH VISTA GUIDE 6FR XBLAD3.5 (CATHETERS) ×2 IMPLANT
DEVICE CONTINUOUS FLUSH (MISCELLANEOUS) ×2 IMPLANT
ELECT DEFIB PAD ADLT CADENCE (PAD) ×2 IMPLANT
GLIDESHEATH SLEND SS 6F .021 (SHEATH) ×2 IMPLANT
GUIDEWIRE INQWIRE 1.5J.035X260 (WIRE) ×1 IMPLANT
INQWIRE 1.5J .035X260CM (WIRE) ×2
KIT ENCORE 26 ADVANTAGE (KITS) ×2 IMPLANT
KIT HEART LEFT (KITS) ×2 IMPLANT
PACK CARDIAC CATHETERIZATION (CUSTOM PROCEDURE TRAY) ×2 IMPLANT
SHEATH PINNACLE 6F 10CM (SHEATH) ×2 IMPLANT
SHEATH PROBE COVER 6X72 (BAG) ×2 IMPLANT
STENT RESOLUTE ONYX 3.0X34 (Permanent Stent) ×2 IMPLANT
STENT RESOLUTE ONYX3.0X38 (Permanent Stent) ×2 IMPLANT
SYR MEDRAD MARK 7 150ML (SYRINGE) ×2 IMPLANT
TRANSDUCER W/STOPCOCK (MISCELLANEOUS) ×2 IMPLANT
TUBING CIL FLEX 10 FLL-RA (TUBING) ×2 IMPLANT
WIRE ASAHI PROWATER 180CM (WIRE) ×4 IMPLANT
WIRE EMERALD 3MM-J .035X150CM (WIRE) ×2 IMPLANT
WIRE PT2 MS 185 (WIRE) ×2 IMPLANT

## 2019-06-18 NOTE — H&P (Signed)
Cardiology Admission History and Physical:   Patient ID: Thadd Apuzzo; MRN: [id number]; DOB: Jun 23, 1941   Admission date: 06/18/2019  Primary Care Provider: Egbert Garibaldi, PA-C Primary Cardiologist: Peter Martinique, MD New Primary Electrophysiologist: None    Chief Complaint: STEMI  Patient Profile:   Jas Betten is a 78 y.o. male with a history of PAD with RLE arteriogram unsuccessful antegrade recanalization of CTO R-post tib on 06/09/2019, L-BKA with nonhealing wound and wound VAC, DM, DVT/PE, Crohn's disease.  History of Present Illness:   Mr. Wolford developed chest pain today while he was in the shower.  He had never had this pain before.  He went to see his PCP, and his ECG was abnormal.  He was given 324 mg of aspirin and EMS was called.  His ECG was consistent with anterior STEMI.  He was transported emergently to the Cath Lab.  He was given sublingual nitroglycerin x2 with improvement in his chest pain.  However, he was still having pain on arrival to [facility] ER and his ECG was still significantly abnormal.  He was transported urgently to the Cath Lab.  He states the pain is been continuous since it started this morning, up to a 6 or 7/10.  It is a central chest pressure.  There is no nausea, vomiting, or diaphoresis with it.  He may be a little short of breath.  He has never had anything like this before.   Past Medical History:  Diagnosis Date  . Arthritis   . Bacteremia due to Escherichia coli 11/2013  . Cancer (HCC)    basal skin- arm, arm squamous  . Crohn disease ([place])   . Diabetes mellitus without complication ([place])   . DM (diabetes mellitus), type 2 with peripheral vascular complications ([place]) 07/13/2692  . DVT (deep venous thrombosis) ([place]) 11/27/13  . Dyspnea    at times  . History of kidney stones    passed  . History of skin cancer    ARMS & FACE  . Peripheral arterial disease ([place]) 02/15/2019  . Pulmonary embolism ([place]) 11/27/13   . Shoulder problem    left torn ligament-   . STEMI (ST elevation myocardial infarction) ([place]) 06/18/2019    Past Surgical History:  Procedure Laterality Date  . ABDOMINAL AORTOGRAM N/A 12/14/2018   Procedure: ABDOMINAL AORTOGRAM;  Surgeon: Waynetta Sandy, MD;  Location: [place] [facility];  Service: Cardiovascular;  Laterality: N/A;  . ABDOMINAL AORTOGRAM W/LOWER EXTREMITY Bilateral 02/17/2019   Procedure: ABDOMINAL AORTOGRAM W/LOWER EXTREMITY;  Surgeon: [name], MD;  Location: [place] [facility];  Service: Cardiovascular;  Laterality: Bilateral;  . ABDOMINAL SURGERY     Bowel resection x2  . AMPUTATION Right 12/21/2018   Procedure: RIGHT LONG FINGER AMPUTATION;  Surgeon: Leanora Cover, MD;  Location: [place];  Service: Orthopedics;  Laterality: Right;  . AMPUTATION Left 03/03/2019   Procedure: AMPUTATION BELOW KNEE;  Surgeon: [name], MD;  Location: [place];  Service: Vascular;  Laterality: Left;  . APPLICATION OF WOUND VAC Left 04/09/2019   Procedure: APPLICATION OF WOUND VAC;  Surgeon: [name], MD;  Location: [place];  Service: Vascular;  Laterality: Left;  . CHOLECYSTECTOMY    . COLON SURGERY    . HIP SURGERY Bilateral    replacement  . JOINT REPLACEMENT    . LOWER EXTREMITY ANGIOGRAPHY Right 12/14/2018   Procedure: LOWER EXTREMITY ANGIOGRAPHY;  Surgeon: Waynetta Sandy, MD;  Location: [place] [facility];  Service: Cardiovascular;  Laterality: Right;  . LOWER EXTREMITY ANGIOGRAPHY N/A 06/09/2019   Procedure: LOWER EXTREMITY ANGIOGRAPHY;  Surgeon: [name], MD;  Location: [place] [facility];  Service: Cardiovascular;  Laterality: N/A;  . PERIPHERAL VASCULAR ATHERECTOMY  02/17/2019   Procedure: PERIPHERAL VASCULAR ATHERECTOMY;  Surgeon: [name], MD;  Location: [place] [facility];  Service: Cardiovascular;;  . UPPER EXTREMITY ANGIOGRAPHY Right 12/14/2018   Procedure: Right UPPER EXTREMITY ANGIOGRAPHY;  Surgeon: Waynetta Sandy, MD;  Location: [place] [facility];  Service: Cardiovascular;  Laterality: Right;  . WOUND DEBRIDEMENT Left 04/09/2019   Procedure: DEBRIDEMENT WOUND LEFT BELOW KNEE STUMP;  Surgeon: [name], MD;  Location: [place];  Service: Vascular;  Laterality: Left;     Medications Prior to Admission: Prior to Admission medications   Medication Sig Start Date End Date Taking? Authorizing Provider  acetaminophen (TYLENOL) 500 MG tablet Take 500 mg by mouth every 6 (six) hours as needed (pain.).     [provider]  albuterol (VENTOLIN HFA) 108 (90 Base) MCG/ACT inhaler Inhale 2 puffs into the lungs every 4 (four) hours as needed for wheezing or shortness of breath.  11/20/18   [provider]  allopurinol (ZYLOPRIM) 100 MG tablet Take 100 mg by mouth daily.    [provider]  amLODipine (NORVASC) 2.5 MG tablet Take 2.5 mg by mouth daily with supper.  11/18/18   [provider]  aspirin EC 81 MG tablet Take 81 mg by mouth daily with supper.     [provider]  benzonatate (TESSALON) 100 MG capsule Take 100 mg by mouth 3 (three) times daily as needed for cough.    [provider]  brimonidine-timolol (COMBIGAN) 0.2-0.5 % ophthalmic solution Place 1 drop into both eyes 2 (two) times a day.    [provider]  Certolizumab Pegol (CIMZIA) 2 X 200 MG KIT Inject 400 mg into the skin every 28 (twenty-eight) days.    [provider]  Cholecalciferol (VITAMIN D) 50 MCG (2000 UT) tablet Take 2,000 Units by mouth daily.    [provider]  Cyanocobalamin (B-12) 2500 MCG TABS Take 2,500 mcg by mouth daily.    [provider]  Dexamethasone Acetate 8 MG/ML SUSP Inject 8 mg into the muscle daily as needed (for Crohn's flare).    [provider]  famciclovir (FAMVIR) 250 MG tablet Take 250 mg by mouth 3 (three) times daily as needed (fungus on corner of mouth flare).  11/18/18   [provider]  ferrous sulfate 325 (65 FE) MG tablet Take 325 mg by mouth daily.    [provider]  latanoprost (XALATAN) 0.005 % ophthalmic solution Place 1 drop into both eyes at bedtime. 11/11/14   [provider]  LORazepam (ATIVAN) 0.5 MG tablet Take 0.5 mg by mouth daily as needed for anxiety. 05/27/19   [provider]  mesalamine (PENTASA) 500 MG CR capsule Take 2,000 mg by mouth 2 (two) times daily.    [provider]  Multiple Vitamin (MULTIVITAMIN WITH MINERALS) TABS Take 1 tablet by mouth daily.    [provider]  Omega-3 Fatty Acids (FISH OIL) 1000 MG CAPS Take 1,000 mg by mouth daily.    [provider]  oxyCODONE (OXY IR/ROXICODONE) 5 MG immediate release tablet Take 1 tablet (5 mg total) by mouth every 6 (six) hours as needed for severe pain. Patient not taking: Reported on 05/27/2019 04/09/19   Dagoberto Ligas, PA-C  pantoprazole (  PROTONIX) 40 MG tablet Take 40 mg by mouth daily in the afternoon.     [provider]  pioglitazone (ACTOS) 15 MG tablet Take 15 mg by mouth daily.  01/13/19   [provider]  pravastatin (PRAVACHOL) 10 MG tablet Take 1 tablet (10 mg total) by mouth every evening. Patient taking differently: Take 10 mg by mouth daily in the afternoon.  02/20/19 04/05/20  Arrien, Jimmy Picket, MD  predniSONE (DELTASONE) 10 MG tablet Take 20 mg by mouth daily after breakfast. Total daily dose=22.5 mg (take with 0.5 tablet 61m tablet)    [provider]  predniSONE (DELTASONE) 5 MG tablet Take 2.5 mg by mouth daily. Total daily dose=22.5 mg (take with #2-10 mg tablets)    [provider]  traMADol (ULTRAM) 50 MG tablet Take 1 tablet (50 mg total) by mouth at bedtime as needed for moderate pain. Patient taking differently: Take 50 mg by mouth every 6 (six) hours as needed for moderate pain.  03/07/19   RGabriel Earing PA-C     Allergies:    Allergies  Allergen Reactions  . Ciprofloxacin  Other (See Comments)    tendons hurt  . Shrimp [Shellfish Allergy]     Social History:   Social History   Socioeconomic History  . Marital status: Married    Spouse name: Not on file  . Number of children: Not on file  . Years of education: Not on file  . Highest education level: Not on file  Occupational History  . Not on file  Tobacco Use  . Smoking status: Former Smoker    Years: 10.00    Types: Pipe  . Smokeless tobacco: Former USystems developer   Quit date: 12/08/1979  Substance and Sexual Activity  . Alcohol use: No    Alcohol/week: 0.0 standard drinks  . Drug use: No  . Sexual activity: Not Currently  Other Topics Concern  . Not on file  Social History Narrative  . Not on file   Social Determinants of Health   Financial Resource Strain:   . Difficulty of Paying Living Expenses: Not on file  Food Insecurity:   . Worried About RCharity fundraiserin the Last Year: Not on file  . Ran Out of Food in the Last Year: Not on file  Transportation Needs:   . Lack of Transportation (Medical): Not on file  . Lack of Transportation (Non-Medical): Not on file  Physical Activity:   . Days of Exercise per Week: Not on file  . Minutes of Exercise per Session: Not on file  Stress:   . Feeling of Stress : Not on file  Social Connections:   . Frequency of Communication with Friends and Family: Not on file  . Frequency of Social Gatherings with Friends and Family: Not on file  . Attends Religious Services: Not on file  . Active Member of Clubs or Organizations: Not on file  . Attends CArchivistMeetings: Not on file  . Marital Status: Not on file  Intimate Partner Violence:   . Fear of Current or Ex-Partner: Not on file  . Emotionally Abused: Not on file  . Physically Abused: Not on file  . Sexually Abused: Not on file    Family History:  The patient's family history includes Hypertension in his mother.   The patient He indicated that his mother is deceased.    ROS:    Please see the history of present illness.  All other ROS  reviewed and negative.     Physical Exam/Data:  There were no vitals filed for this visit. No intake or output data in the 24 hours ending 06/18/19 1705 There were no vitals filed for this visit. There is no height or weight on file to calculate BMI.  General:  Well nourished, well developed, male in acute distress HEENT: normal Lymph: no adenopathy Neck:  JVD not elevated Endocrine:  No thryomegaly Vascular: No carotid bruits; upper extremity pulses 1-2+ bilaterally  Cardiac:  normal S1, S2; RRR; no murmur, no rub or gallop  Lungs:  clear to auscultation bilaterally, no wheezing, rhonchi or rales  Abd: soft, nontender, no hepatomegaly  Ext: Trace right lower extremity edema, s/p L-BKA with wound VAC in place, unable to palpate right DP, femoral pulses palpable Musculoskeletal:  No deformities, BUE and BLE strength normal and equal Skin: warm and dry  Neuro:  CNs 2-12 intact, no focal abnormalities noted Psych:  Normal affect    EKG:  The ECG was personally reviewed: AMS ECG is sinus rhythm, right bundle branch block is new, ST elevation in V2-4 Telemetry: Sinus rhythm  Relevant CV Studies:  Procedure Performed: 1.Ultrasound-guided access of the left common femoral artery 2.Aortogram 3.Right lower extremity arteriogram with selection of third order branches including anterior tibial, peroneal and posterior tibial artery 4.Unsuccessful antegrade recanalization of chronic total occlusion in therightposterior tibial artery 5.99 minutes of monitored moderate conscioussedationtime 6.Mynx closure of the left common femoral artery  Indications:Patient is a 78 year old male well-known to the vascular surgery service. He previous underwent left lower extremity intervention that failed and ultimately required left BKA. He presents today for right lower extremity arteriogram and intervention with known severe  tibial disease for nonhealing toe wound that has been present for over one year. Risk and benefits were discussed with the patient.  Laboratory Data:  ChemistryNo results for input(s): NA, K, CL, CO2, GLUCOSE, BUN, CREATININE, CALCIUM, GFRNONAA, GFRAA, ANIONGAP in the last 168 hours.  No results for input(s): PROT, ALBUMIN, AST, ALT, ALKPHOS, BILITOT in the last 168 hours. HematologyNo results for input(s): WBC, RBC, HGB, HCT, MCV, MCH, MCHC, RDW, PLT in the last 168 hours. Cardiac Enzymes  High Sensitivity Troponin:  No results for input(s): TROPONINIHS in the last 720 hours.   BNPNo results for input(s): BNP, PROBNP in the last 168 hours.  DDimer No results for input(s): DDIMER in the last 168 hours. Lipids:  Lab Results  Component Value Date   CHOL  03/15/2008    129        ATP III CLASSIFICATION:  <200     mg/dL   Desirable  200-239  mg/dL   Borderline High  >=240    mg/dL   High   HDL 32 (L) 03/15/2008   LDLCALC  03/15/2008    70        Total Cholesterol/HDL:CHD Risk Coronary Heart Disease Risk Table                     Men   Women  1/2 Average Risk   3.4   3.3   TRIG 135 03/15/2008   CHOLHDL 4.0 03/15/2008   INR:  Lab Results  Component Value Date   INR 1.0 04/26/2019   INR 1.0 02/15/2019   INR 1.41 12/14/2013   A1c:  Lab Results  Component Value Date   HGBA1C 6.6 (H) 03/02/2019   Thyroid:  Lab Results  Component Value Date   TSH 0.313 **Test methodology is  3rd generation TSH** (L) 03/15/2008    Radiology/Studies:  No results found.  Assessment and Plan:   1.  STEMI: -Mr. Nohr is being taken emergently to the Cath Lab with further evaluation and treatment depending on the results. -He will be screened for cardiac risk factors and their control. -Continue other home medications.  2.  PAD: -He has a wound VAC in place on his left leg, continue this -On 12/2, he had a procedure for the right lower extremity, done from the left CFA. -It showed  patent larger vessels, but severe tibial disease and unsuccessful recanalization of CTO right posterior tibial artery -Dr. Carlis Abbott may need to see in consult to manage the wound.   Principal Problem:   STEMI (ST elevation myocardial infarction) ([place])     For questions or updates, please contact [facility] Please consult [email or web address] for contact info under Cardiology/STEMI.    SignedRosaria Ferries, PA-C  06/18/2019 5:05 PM

## 2019-06-18 NOTE — ED Provider Notes (Signed)
[facility] EMERGENCY DEPARTMENT Provider Note   CSN: [id number] Arrival date & time:        History No chief complaint on file.   Rodney Sullivan is a 78 y.o. male.  The history is provided by the patient, medical records and the EMS personnel. No language interpreter was used.  Chest Pain Pain location:  Substernal area Pain quality: crushing and pressure   Pain radiates to:  Does not radiate Pain severity:  Severe Onset quality:  Gradual Timing:  Constant Progression:  Improving Chronicity:  New Relieved by:  Nothing Worsened by:  Exertion Associated symptoms: shortness of breath   Associated symptoms: no abdominal pain, no back pain, no claudication, no cough, no diaphoresis, no fatigue, no fever, no headache, no lower extremity edema, no nausea, no palpitations, no syncope and no vomiting   Risk factors: male sex        Past Medical History:  Diagnosis Date  . Arthritis   . Bacteremia due to Escherichia coli 11/2013  . Cancer (HCC)    basal skin- arm, arm squamous  . Crohn disease ([place])   . Diabetes mellitus without complication ([place])   . DM (diabetes mellitus), type 2 with peripheral vascular complications ([place]) 01/17/4579  . DVT (deep venous thrombosis) ([place]) 11/27/13  . Dyspnea    at times  . History of kidney stones    passed  . History of skin cancer    ARMS & FACE  . Peripheral arterial disease ([place]) 02/15/2019  . Pulmonary embolism ([place]) 11/27/13   . Shoulder problem    left torn ligament-   . STEMI (ST elevation myocardial infarction) ([place]) 06/18/2019    Patient Active Problem List   Diagnosis Date Noted  . STEMI (ST elevation myocardial infarction) ([place]) 06/18/2019  . Bacteremia due to Escherichia coli 04/27/2019  . Gangrene of left foot ([place]) 03/01/2019  . Sepsis ([place]) 02/15/2019  . Anemia 02/15/2019  . Cellulitis and abscess of toe of left foot 02/15/2019  . Peripheral arterial disease ([place]) 02/15/2019  . DM (diabetes  mellitus), type 2 with peripheral vascular complications ([place]) 99/83/3825  . Gout 02/15/2019  . Septic shock ([place]) 12/14/2013  . Pulmonary embolism ([place]) 12/14/2013  . Crohn disease ([place]) 12/14/2013    Past Surgical History:  Procedure Laterality Date  . ABDOMINAL AORTOGRAM N/A 12/14/2018   Procedure: ABDOMINAL AORTOGRAM;  Surgeon: Waynetta Sandy, MD;  Location: [facility];  Service: Cardiovascular;  Laterality: N/A;  . ABDOMINAL AORTOGRAM W/LOWER EXTREMITY Bilateral 02/17/2019   Procedure: ABDOMINAL AORTOGRAM W/LOWER EXTREMITY;  Surgeon: [name], MD;  Location: [facility];  Service: Cardiovascular;  Laterality: Bilateral;  . ABDOMINAL SURGERY     Bowel resection x2  . AMPUTATION Right 12/21/2018   Procedure: RIGHT LONG FINGER AMPUTATION;  Surgeon: Leanora Cover, MD;  Location: [facility];  Service: Orthopedics;  Laterality: Right;  . AMPUTATION Left 03/03/2019   Procedure: AMPUTATION BELOW KNEE;  Surgeon: [name], MD;  Location: [facility];  Service: Vascular;  Laterality: Left;  . APPLICATION OF WOUND VAC Left 04/09/2019   Procedure: APPLICATION OF WOUND VAC;  Surgeon: [name], MD;  Location: [facility];  Service: Vascular;  Laterality: Left;  . CHOLECYSTECTOMY    . COLON SURGERY    . HIP SURGERY Bilateral    replacement  . JOINT REPLACEMENT    . LOWER EXTREMITY ANGIOGRAPHY Right 12/14/2018   Procedure: LOWER EXTREMITY ANGIOGRAPHY;  Surgeon: Waynetta Sandy,  MD;  Location: [facility];  Service: Cardiovascular;  Laterality: Right;  . LOWER EXTREMITY ANGIOGRAPHY N/A 06/09/2019   Procedure: LOWER EXTREMITY ANGIOGRAPHY;  Surgeon: [name], MD;  Location: [facility];  Service: Cardiovascular;  Laterality: N/A;  . PERIPHERAL VASCULAR ATHERECTOMY  02/17/2019   Procedure: PERIPHERAL VASCULAR ATHERECTOMY;  Surgeon: [name], MD;  Location: [facility];  Service:  Cardiovascular;;  . UPPER EXTREMITY ANGIOGRAPHY Right 12/14/2018   Procedure: Right UPPER EXTREMITY ANGIOGRAPHY;  Surgeon: Waynetta Sandy, MD;  Location: [facility];  Service: Cardiovascular;  Laterality: Right;  . WOUND DEBRIDEMENT Left 04/09/2019   Procedure: DEBRIDEMENT WOUND LEFT BELOW KNEE STUMP;  Surgeon: [name], MD;  Location: [facility];  Service: Vascular;  Laterality: Left;       Family History  Problem Relation Age of Onset  . Hypertension Mother     Social History   Tobacco Use  . Smoking status: Former Smoker    Years: 10.00    Types: Pipe  . Smokeless tobacco: Former Systems developer    Quit date: 12/08/1979  Substance Use Topics  . Alcohol use: No    Alcohol/week: 0.0 standard drinks  . Drug use: No    Home Medications Prior to Admission medications   Medication Sig Start Date End Date Taking? Authorizing Provider  acetaminophen (TYLENOL) 500 MG tablet Take 500 mg by mouth every 6 (six) hours as needed (pain.).     [provider]  albuterol (VENTOLIN HFA) 108 (90 Base) MCG/ACT inhaler Inhale 2 puffs into the lungs every 4 (four) hours as needed for wheezing or shortness of breath.  11/20/18   [provider]  allopurinol (ZYLOPRIM) 100 MG tablet Take 100 mg by mouth daily.    [provider]  amLODipine (NORVASC) 2.5 MG tablet Take 2.5 mg by mouth daily with supper.  11/18/18   [provider]  aspirin EC 81 MG tablet Take 81 mg by mouth daily with supper.     [provider]  benzonatate (TESSALON) 100 MG capsule Take 100 mg by mouth 3 (three) times daily as needed for cough.    [provider]  brimonidine-timolol (COMBIGAN) 0.2-0.5 % ophthalmic solution Place 1 drop into both eyes 2 (two) times a day.    [provider]  Certolizumab Pegol (CIMZIA) 2 X 200 MG KIT Inject 400 mg into the skin every 28 (twenty-eight) days.    [provider]  Cholecalciferol (VITAMIN D) 50 MCG (2000  UT) tablet Take 2,000 Units by mouth daily.    [provider]  Cyanocobalamin (B-12) 2500 MCG TABS Take 2,500 mcg by mouth daily.    [provider]  Dexamethasone Acetate 8 MG/ML SUSP Inject 8 mg into the muscle daily as needed (for Crohn's flare).    [provider]  famciclovir (FAMVIR) 250 MG tablet Take 250 mg by mouth 3 (three) times daily as needed (fungus on corner of mouth flare).  11/18/18   [provider]  ferrous sulfate 325 (65 FE) MG tablet Take 325 mg by mouth daily.    [provider]  latanoprost (XALATAN) 0.005 % ophthalmic solution Place 1 drop into both eyes at bedtime. 11/11/14   [provider]  LORazepam (ATIVAN) 0.5 MG tablet Take 0.5 mg by mouth daily as needed for anxiety. 05/27/19   [provider]  mesalamine (PENTASA) 500 MG CR capsule Take 2,000 mg by mouth 2 (two) times daily.  [provider]  Multiple Vitamin (MULTIVITAMIN WITH MINERALS) TABS Take 1 tablet by mouth daily.    [provider]  Omega-3 Fatty Acids (FISH OIL) 1000 MG CAPS Take 1,000 mg by mouth daily.    [provider]  oxyCODONE (OXY IR/ROXICODONE) 5 MG immediate release tablet Take 1 tablet (5 mg total) by mouth every 6 (six) hours as needed for severe pain. Patient not taking: Reported on 05/27/2019 04/09/19   Dagoberto Ligas, PA-C  pantoprazole (PROTONIX) 40 MG tablet Take 40 mg by mouth daily in the afternoon.     [provider]  pioglitazone (ACTOS) 15 MG tablet Take 15 mg by mouth daily.  01/13/19   [provider]  pravastatin (PRAVACHOL) 10 MG tablet Take 1 tablet (10 mg total) by mouth every evening. Patient taking differently: Take 10 mg by mouth daily in the afternoon.  02/20/19 04/05/20  Arrien, Jimmy Picket, MD  predniSONE (DELTASONE) 10 MG tablet Take 20 mg by mouth daily after breakfast. Total daily dose=22.5 mg (take with 0.5 tablet 30m tablet)    [provider]    predniSONE (DELTASONE) 5 MG tablet Take 2.5 mg by mouth daily. Total daily dose=22.5 mg (take with #2-10 mg tablets)    [provider]  traMADol (ULTRAM) 50 MG tablet Take 1 tablet (50 mg total) by mouth at bedtime as needed for moderate pain. Patient taking differently: Take 50 mg by mouth every 6 (six) hours as needed for moderate pain.  03/07/19   Rhyne, SHulen Shouts PA-C    Allergies    Ciprofloxacin and Shrimp [shellfish allergy]  Review of Systems   Review of Systems  Constitutional: Negative for chills, diaphoresis, fatigue and fever.  HENT: Negative for congestion.   Respiratory: Positive for shortness of breath. Negative for cough, chest tightness and wheezing.   Cardiovascular: Positive for chest pain. Negative for palpitations, claudication, leg swelling and syncope.  Gastrointestinal: Negative for abdominal pain, constipation, diarrhea, nausea and vomiting.  Genitourinary: Negative for flank pain.  Musculoskeletal: Negative for back pain.  Neurological: Negative for headaches.  All other systems reviewed and are negative.   Physical Exam Updated Vital Signs BP 106/77   Pulse 96   Temp (!) 97.5 F (36.4 C) (Oral)   Resp 18   SpO2 93%   Physical Exam Vitals and nursing note reviewed.  Constitutional:      General: He is not in acute distress.    Appearance: Normal appearance. He is well-developed. He is not ill-appearing, toxic-appearing or diaphoretic.  HENT:     Head: Normocephalic and atraumatic.  Eyes:     Conjunctiva/sclera: Conjunctivae normal.  Cardiovascular:     Rate and Rhythm: Normal rate and regular rhythm.     Pulses: Normal pulses.     Heart sounds: No murmur.  Pulmonary:     Effort: Pulmonary effort is normal. No respiratory distress.     Breath sounds: Normal breath sounds. No stridor. No wheezing, rhonchi or rales.  Chest:     Chest wall: No tenderness.  Abdominal:     Palpations: Abdomen is soft.     Tenderness: There is no  abdominal tenderness.  Musculoskeletal:        General: No tenderness.     Cervical back: Neck supple. No tenderness.     Right lower leg: No edema.  Skin:    General: Skin is warm and dry.     Capillary Refill: Capillary refill takes less than 2 seconds.  Findings: No erythema.  Neurological:     Mental Status: He is alert.  Psychiatric:        Mood and Affect: Mood normal.     ED Results / Procedures / Treatments   Labs (all labs ordered are listed, but only abnormal results are displayed) Labs Reviewed  HEMOGLOBIN A1C - Abnormal; Notable for the following components:      Result Value   Hgb A1c MFr Bld 6.9 (*)    All other components within normal limits  CBC WITH DIFFERENTIAL/PLATELET - Abnormal; Notable for the following components:   WBC 12.7 (*)    Hemoglobin 11.8 (*)    HCT 38.1 (*)    MCH 25.8 (*)    RDW 17.0 (*)    Neutro Abs 8.4 (*)    Monocytes Absolute 1.2 (*)    Abs Immature Granulocytes 0.16 (*)    All other components within normal limits  COMPREHENSIVE METABOLIC PANEL - Abnormal; Notable for the following components:   Glucose, Bld 140 (*)    Calcium 8.8 (*)    Total Protein 5.3 (*)    Albumin 3.0 (*)    AST 490 (*)    ALT 78 (*)    All other components within normal limits  GLUCOSE, CAPILLARY - Abnormal; Notable for the following components:   Glucose-Capillary 113 (*)    All other components within normal limits  TROPONIN I (HIGH SENSITIVITY) - Abnormal; Notable for the following components:   Troponin I (High Sensitivity) >27,000 (*)    All other components within normal limits  TROPONIN I (HIGH SENSITIVITY) - Abnormal; Notable for the following components:   Troponin I (High Sensitivity) >27,000 (*)    All other components within normal limits  RESPIRATORY PANEL BY RT PCR (FLU A&B, COVID)  MRSA PCR SCREENING  TSH  CBC  CBC  COMPREHENSIVE METABOLIC PANEL  LIPID PANEL  LIPID PANEL  POCT ACTIVATED CLOTTING TIME  POCT ACTIVATED CLOTTING  TIME  POCT ACTIVATED CLOTTING TIME  POCT ACTIVATED CLOTTING TIME  POCT ACTIVATED CLOTTING TIME    EKG None  Radiology CARDIAC CATHETERIZATION  Result Date: 06/18/2019  Prox LAD to Mid LAD lesion is 100% stenosed.  A drug-eluting stent was successfully placed using a STENT RESOLUTE [id number].  A drug-eluting stent was successfully placed using a STENT RESOLUTE [id number] [id number].  Post intervention, there is a 0% residual stenosis.  1st Diag lesion is 100% stenosed.  Mid Cx to Dist Cx lesion is 50% stenosed.  Prox RCA to Mid RCA lesion is 30% stenosed.  There is severe left ventricular systolic dysfunction.  The left ventricular ejection fraction is 25-35% by visual estimate.  LV end diastolic pressure is mildly elevated.  There is no mitral valve regurgitation.  1. 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 Severe diffuse disease in the vessel. S/p emergent stenting with DES x 2 with No reflow phenomenon. TIMI 2 flow restored. CTO of the first diagonal 2. Severe LV dysfunction with apical dyskinesia and anterolateral severe HK. EF 30-35% 3. Mildly elevated LVEDP Plan; DAPT with ASA and Brilinta for at least one year. Consider indefinite DAPT given PAD and long segment of stents in the LAD Will need beta blocker and ARB therapy +/- aldactone for LV dysfunction. Titrate as tolerated. Will continue IV Ntg overnight. Check Echo.    Procedures Procedures (including critical care time)  CRITICAL CARE Performed by: Gwenyth Allegra Aarna Mihalko Total critical care time: 15 minutes Critical care time was exclusive of  separately billable procedures and treating other patients. Critical care was necessary to treat or prevent imminent or life-threatening deterioration. Critical care was time spent personally by me on the following activities: development of treatment plan with patient and/or surrogate as well as nursing, discussions with consultants, evaluation of patient's response to  treatment, examination of patient, obtaining history from patient or surrogate, ordering and performing treatments and interventions, ordering and review of laboratory studies, ordering and review of radiographic studies, pulse oximetry and re-evaluation of patient's condition.   Medications Ordered in ED Medications  nitroGLYCERIN (NITROSTAT) SL tablet 0.4 mg (has no administration in time range)  acetaminophen (TYLENOL) tablet 650 mg (has no administration in time range)  ondansetron (ZOFRAN) injection 4 mg (has no administration in time range)  zolpidem (AMBIEN) tablet 5 mg (has no administration in time range)  ALPRAZolam (XANAX) tablet 0.25 mg (has no administration in time range)  albuterol (PROVENTIL) (2.5 MG/3ML) 0.083% nebulizer solution 3 mL (has no administration in time range)  allopurinol (ZYLOPRIM) tablet 100 mg (has no administration in time range)  aspirin EC tablet 81 mg (has no administration in time range)  cholecalciferol (VITAMIN D3) tablet 2,000 Units (has no administration in time range)  vitamin B-12 (CYANOCOBALAMIN) tablet 2,000 mcg (has no administration in time range)  ferrous sulfate tablet 325 mg (has no administration in time range)  latanoprost (XALATAN) 0.005 % ophthalmic solution 1 drop (1 drop Both Eyes Given 06/18/19 2300)  LORazepam (ATIVAN) tablet 0.5 mg (has no administration in time range)  mesalamine (PENTASA) CR capsule 2,000 mg (2,000 mg Oral Given 06/18/19 2300)  multivitamin with minerals tablet 1 tablet (has no administration in time range)  pantoprazole (PROTONIX) EC tablet 40 mg (has no administration in time range)  pioglitazone (ACTOS) tablet 15 mg (has no administration in time range)  traMADol (ULTRAM) tablet 50 mg (has no administration in time range)  insulin aspart (novoLOG) injection 0-9 Units (has no administration in time range)  insulin aspart (novoLOG) injection 0-5 Units (0 Units Subcutaneous Incomplete 06/18/19 2322)  enoxaparin  (LOVENOX) injection 40 mg (has no administration in time range)  0.9% sodium chloride infusion (1 mL/kg/hr  74.8 kg Intravenous New Bag/Given 06/18/19 1932)  sodium chloride flush (NS) 0.9 % injection 3 mL (has no administration in time range)  sodium chloride flush (NS) 0.9 % injection 3 mL (has no administration in time range)  0.9 %  sodium chloride infusion (has no administration in time range)  nitroGLYCERIN 50 mg in dextrose 5 % 250 mL (0.2 mg/mL) infusion (10 mcg/min Intravenous Rate/Dose Verify 06/18/19 2000)  atorvastatin (LIPITOR) tablet 80 mg (0 mg Oral Hold 06/18/19 2306)  ticagrelor (BRILINTA) tablet 90 mg (has no administration in time range)  carvedilol (COREG) tablet 3.125 mg (has no administration in time range)  losartan (COZAAR) tablet 12.5 mg (12.5 mg Oral Given 06/18/19 2033)  omega-3 acid ethyl esters (LOVAZA) capsule 1 g (has no administration in time range)  predniSONE (DELTASONE) tablet 22.5 mg (has no administration in time range)  brimonidine (ALPHAGAN) 0.2 % ophthalmic solution 1 drop (1 drop Both Eyes Given 06/18/19 2300)    And  timolol (TIMOPTIC) 0.5 % ophthalmic solution 1 drop (1 drop Both Eyes Given 06/18/19 2300)  atropine 1 MG/10ML injection (has no administration in time range)    ED Course  I have reviewed the triage vital signs and the nursing notes.  Pertinent labs & imaging results that were available during my care of the patient were reviewed by  me and considered in my medical decision making (see chart for details).    MDM Rules/Calculators/A&P                        Micahel Omlor is a 78 y.o. male with a past medical history significant for prior DVT/PE, diabetes, Crohn's, and peripheral artery disease who presents as a code STEMI for chest pain.  Patient reports that he has been having chest discomfort there is a pressure in her central chest.  It associated with shortness of breath.  It is exertional but not pleuritic.  He reports no  significant nausea, vomiting, urinary symptoms or GI symptoms.  He denies any radiation of the pain.  He denies recent trauma.  He reports it was an 8 out of 10 severity but that he took 2 nitroglycerin and aspirin improving his discomfort.  EKGs were concerning with EMS and activated code STEMI.  Cardiology recommend he come to the emergency department for initial evaluation and Covid test.  Patient denies fevers, chills, congestion, or cough.  Denies any other complaints.  His chest pain has improved and is now a 1 out of 10 in severity.  Review of the telemetry strips from EMS are concerning for ischemia or STEMI.  Cardiology came quickly to the bedside and requested he get heparin and go straight to the Cath Lab and not wait for blood work in the emergency department or repeat EKG at this time.  Patient quickly assessed by me and his lungs were clear and chest was nontender.  Good pulses in upper extremities.  Left lower extremity amputation.  Right leg not significantly tender.  Abdomen nontender.  Patient resting comfortably with improvement in his pain.  Patient will get the heparin and had a Covid test ordered.  Patient will be taken to the Cath Lab for further management of his STEMI.   Final Clinical Impression(s) / ED Diagnoses Final diagnoses:  ST elevation myocardial infarction (STEMI), unspecified artery (HCC)  Chest pain, unspecified type    Clinical Impression: 1. ST elevation myocardial infarction (STEMI), unspecified artery (HCC)   2. Chest pain, unspecified type     Disposition: Admit  This note was prepared with assistance of Dragon voice recognition software. Occasional wrong-word or sound-a-like substitutions may have occurred due to the inherent limitations of voice recognition software.     Luetta Piazza, Gwenyth Allegra, MD 06/18/19 [phone]

## 2019-06-18 NOTE — ED Notes (Signed)
Pt given 4000 units of heparin per Dr Martinique and swabbed for covid while on EMS stretcher , EMS transported to Cath lab

## 2019-06-19 ENCOUNTER — Other Ambulatory Visit: Payer: Self-pay

## 2019-06-19 ENCOUNTER — Inpatient Hospital Stay (HOSPITAL_COMMUNITY)
Admission: EM | Disposition: A | Discharge status: Home Health | Payer: Self-pay | Source: Home / Self Care | Attending: Cardiology

## 2019-06-19 ENCOUNTER — Inpatient Hospital Stay (HOSPITAL_COMMUNITY): Payer: Medicare HMO

## 2019-06-19 DIAGNOSIS — R570 Cardiogenic shock: Secondary | ICD-10-CM

## 2019-06-19 DIAGNOSIS — I739 Peripheral vascular disease, unspecified: Secondary | ICD-10-CM

## 2019-06-19 DIAGNOSIS — I4510 Unspecified right bundle-branch block: Secondary | ICD-10-CM

## 2019-06-19 DIAGNOSIS — I213 ST elevation (STEMI) myocardial infarction of unspecified site: Secondary | ICD-10-CM

## 2019-06-19 DIAGNOSIS — T8789 Other complications of amputation stump: Secondary | ICD-10-CM

## 2019-06-19 DIAGNOSIS — I998 Other disorder of circulatory system: Secondary | ICD-10-CM

## 2019-06-19 DIAGNOSIS — I5021 Acute systolic (congestive) heart failure: Secondary | ICD-10-CM

## 2019-06-19 DIAGNOSIS — I442 Atrioventricular block, complete: Secondary | ICD-10-CM

## 2019-06-19 HISTORY — PX: RIGHT HEART CATH: CATH118263

## 2019-06-19 HISTORY — PX: TEMPORARY PACEMAKER: CATH118268

## 2019-06-19 LAB — COMPREHENSIVE METABOLIC PANEL
ALT: 71 U/L — ABNORMAL HIGH (ref 0–44)
AST: 332 U/L — ABNORMAL HIGH (ref 15–41)
Albumin: 2.9 g/dL — ABNORMAL LOW (ref 3.5–5.0)
Alkaline Phosphatase: 47 U/L (ref 38–126)
Anion gap: 14 (ref 5–15)
BUN: 11 mg/dL (ref 8–23)
CO2: 23 mmol/L (ref 22–32)
Calcium: 8.7 mg/dL — ABNORMAL LOW (ref 8.9–10.3)
Chloride: 102 mmol/L (ref 98–111)
Creatinine, Ser: 1.02 mg/dL (ref 0.61–1.24)
GFR calc Af Amer: >60 mL/min (ref >60)
GFR calc non Af Amer: >60 mL/min (ref >60)
Glucose, Bld: 138 mg/dL — ABNORMAL HIGH (ref 70–99)
Potassium: 4.1 mmol/L (ref 3.5–5.1)
Sodium: 139 mmol/L (ref 135–145)
Total Bilirubin: 1.1 mg/dL (ref 0.3–1.2)
Total Protein: 5 g/dL — ABNORMAL LOW (ref 6.5–8.1)

## 2019-06-19 LAB — CBC
HCT: 35.3 % — ABNORMAL LOW (ref 39.0–52.0)
Hemoglobin: 10.9 g/dL — ABNORMAL LOW (ref 13.0–17.0)
MCH: 25.5 pg — ABNORMAL LOW (ref 26.0–34.0)
MCHC: 30.9 g/dL (ref 30.0–36.0)
MCV: 82.7 fL (ref 80.0–100.0)
Platelets: 264 10*3/uL (ref 150–400)
RBC: 4.27 MIL/uL (ref 4.22–5.81)
RDW: 17.2 % — ABNORMAL HIGH (ref 11.5–15.5)
WBC: 15.2 10*3/uL — ABNORMAL HIGH (ref 4.0–10.5)
nRBC: 0.1 % (ref 0.0–0.2)

## 2019-06-19 LAB — GLUCOSE, CAPILLARY
Glucose-Capillary: 142 mg/dL — ABNORMAL HIGH (ref 70–99)
Glucose-Capillary: 213 mg/dL — ABNORMAL HIGH (ref 70–99)
Glucose-Capillary: 167 mg/dL — ABNORMAL HIGH (ref 70–99)
Glucose-Capillary: 147 mg/dL — ABNORMAL HIGH (ref 70–99)

## 2019-06-19 LAB — LIPID PANEL
Cholesterol: 151 mg/dL (ref 0–200)
Cholesterol: 171 mg/dL (ref 0–200)
HDL: 49 mg/dL (ref >40)
HDL: 38 mg/dL — ABNORMAL LOW (ref >40)
LDL Cholesterol: 48 mg/dL (ref 0–99)
LDL Cholesterol: UNDETERMINED mg/dL (ref 0–99)
Total CHOL/HDL Ratio: 3.1 RATIO
Total CHOL/HDL Ratio: 4.5 ratio
Triglycerides: 268 mg/dL — ABNORMAL HIGH (ref <150)
Triglycerides: 676 mg/dL — ABNORMAL HIGH (ref <150)
VLDL: 54 mg/dL — ABNORMAL HIGH (ref 0–40)
VLDL: UNDETERMINED mg/dL (ref 0–40)

## 2019-06-19 LAB — ECHOCARDIOGRAM COMPLETE: Weight: 2716.07 oz

## 2019-06-19 LAB — LDL CHOLESTEROL, DIRECT: Direct LDL: 59.7 mg/dL (ref 0–99)

## 2019-06-19 LAB — BRAIN NATRIURETIC PEPTIDE: B Natriuretic Peptide: 268.9 pg/mL — ABNORMAL HIGH (ref 0.0–100.0)

## 2019-06-19 SURGERY — TEMPORARY PACEMAKER
Anesthesia: LOCAL

## 2019-06-19 MED ORDER — LIDOCAINE HCL (PF) 1 % IJ SOLN
INTRAMUSCULAR | Status: DC | PRN
  Administered 2019-06-19 (×2): 15 mL

## 2019-06-19 MED ORDER — NOREPINEPHRINE 4 MG/250ML-% IV SOLN
INTRAVENOUS | Status: AC
  Filled 2019-06-19: qty 250

## 2019-06-19 MED ORDER — SODIUM CHLORIDE 0.9 % IV SOLN
INTRAVENOUS | Status: AC | PRN
  Administered 2019-06-19: 500 mL via INTRAVENOUS

## 2019-06-19 MED ORDER — HEPARIN (PORCINE) IN NACL 1000-0.9 UT/500ML-% IV SOLN
INTRAVENOUS | Status: AC
  Filled 2019-06-19: qty 1000

## 2019-06-19 MED ORDER — ATROPINE SULFATE 1 MG/10ML IJ SOSY
PREFILLED_SYRINGE | INTRAMUSCULAR | Status: AC
  Filled 2019-06-19: qty 10

## 2019-06-19 MED ORDER — LIDOCAINE HCL (PF) 1 % IJ SOLN
INTRAMUSCULAR | Status: AC
  Filled 2019-06-19: qty 30

## 2019-06-19 MED ORDER — NOREPINEPHRINE 4 MG/250ML-% IV SOLN
0.0000 ug/min | INTRAVENOUS | Status: DC
  Administered 2019-06-19: 2 ug/min via INTRAVENOUS

## 2019-06-19 MED ORDER — NOREPINEPHRINE BITARTRATE 1 MG/ML IV SOLN
INTRAVENOUS | Status: AC | PRN
  Administered 2019-06-19: 5 ug/min via INTRAVENOUS

## 2019-06-19 MED ORDER — SODIUM CHLORIDE 0.9 % IV SOLN
INTRAVENOUS | Status: AC | PRN
  Administered 2019-06-19: 250 mL via INTRAVENOUS

## 2019-06-19 MED ORDER — CHLORHEXIDINE GLUCONATE CLOTH 2 % EX PADS
6.0000 | MEDICATED_PAD | Freq: Every day | CUTANEOUS | Status: DC
  Administered 2019-06-19 – 2019-06-24 (×4): 6 via TOPICAL

## 2019-06-19 SURGICAL SUPPLY — 16 items
BioTrace Tempo Pacer IMPLANT
CABLE ADAPT PACING TEMP 12FT (ADAPTER) ×2 IMPLANT
CATH S G BIP PACING (CATHETERS) ×2 IMPLANT
CATH SWAN GANZ 7F STRAIGHT (CATHETERS) ×2 IMPLANT
KIT HEART LEFT (KITS) IMPLANT
KIT MICROPUNCTURE NIT STIFF (SHEATH) ×2 IMPLANT
PACK CARDIAC CATHETERIZATION (CUSTOM PROCEDURE TRAY) ×2 IMPLANT
SHEATH BRITE TIP 6FR 35CM (SHEATH) ×2 IMPLANT
SHEATH PINNACLE 7F 10CM (SHEATH) ×2 IMPLANT
SHEATH PROBE COVER 6X72 (BAG) ×2 IMPLANT
SLEEVE REPOSITIONING LENGTH 30 (MISCELLANEOUS) ×4 IMPLANT
TRANSDUCER W/STOPCOCK (MISCELLANEOUS) ×2 IMPLANT
TUBING ART PRESS 72  MALE/FEM (TUBING) ×1
TUBING ART PRESS 72 MALE/FEM (TUBING) ×1 IMPLANT
TUBING CIL FLEX 10 FLL-RA (TUBING) ×2 IMPLANT
WIRE EMERALD 3MM-J .035X150CM (WIRE) ×2 IMPLANT

## 2019-06-19 NOTE — Accreditation Note (Deleted)
Progress Note  Patient Name: Rodney Sullivan Date of Encounter: 06/19/2019  Primary Cardiologist: Peter Martinique, MD   Subjective   Feels weak. No chest pain or dyspnea. Denies headache.  Inpatient Medications    Scheduled Meds: . allopurinol  100 mg Oral Daily  . aspirin EC  81 mg Oral Q supper  . atorvastatin  80 mg Oral q1800  . brimonidine  1 drop Both Eyes Q12H   And  . timolol  1 drop Both Eyes Q12H  . carvedilol  3.125 mg Oral BID WC  . Chlorhexidine Gluconate Cloth  6 each Topical Daily  . cholecalciferol  2,000 Units Oral Daily  . enoxaparin (LOVENOX) injection  40 mg Subcutaneous Q24H  . ferrous sulfate  325 mg Oral Daily  . insulin aspart  0-5 Units Subcutaneous QHS  . insulin aspart  0-9 Units Subcutaneous TID WC  . latanoprost  1 drop Both Eyes QHS  . losartan  12.5 mg Oral Daily  . mesalamine  2,000 mg Oral BID  . multivitamin with minerals  1 tablet Oral Daily  . omega-3 acid ethyl esters  1 g Oral Daily  . pantoprazole  40 mg Oral Q1500  . pioglitazone  15 mg Oral Daily  . predniSONE  22.5 mg Oral Q breakfast  . sodium chloride flush  3 mL Intravenous Q12H  . ticagrelor  90 mg Oral BID  . vitamin B-12  2,000 mcg Oral Daily   Continuous Infusions: . sodium chloride    . nitroGLYCERIN 5 mcg/min (06/19/19 0700)   PRN Meds: sodium chloride, acetaminophen, albuterol, ALPRAZolam, LORazepam, nitroGLYCERIN, ondansetron (ZOFRAN) IV, sodium chloride flush, traMADol, zolpidem   Vital Signs    Vitals:   06/19/19 0645 06/19/19 0700 06/19/19 0727 06/19/19 0831  BP:  116/87    Pulse: (!) 103 (!) 122  (!) 102  Resp: 16 (!) 22    Temp:   98.3 F (36.8 C)   TempSrc:   Oral   SpO2: 94% 92%    Weight:        Intake/Output Summary (Last 24 hours) at 06/19/2019 0906 Last data filed at 06/19/2019 0700 Gross per 24 hour  Intake 394.67 ml  Output 1300 ml  Net -905.33 ml   Last 3 Weights 06/19/2019 06/09/2019 05/25/2019  Weight (lbs) 169 lb 12.1 oz 165 lb 170  lb  Weight (kg) 77 kg 74.844 kg 77.111 kg      Telemetry    NSR - Personally Reviewed  ECG    NSR with RBBB and QS V1-4. - Personally Reviewed  Physical Exam  Elderly and old appearing GEN: No acute distress.   Neck: No JVD Cardiac: RRR, no murmurs, rubs, or gallops.  Respiratory: Clear to auscultation bilaterally. GI: Soft, nontender, non-distended  MS: left BKA Neuro:  Nonfocal  Psych: Normal affect   Labs    High Sensitivity Troponin:   Recent Labs  Lab 06/18/19 1815 06/18/19 1957 06/18/19 2207  TROPONINIHS >27,000* >27,000* >27,000*      Chemistry Recent Labs  Lab 06/18/19 1815 06/18/19 1957 06/19/19 0356  NA 137 140 139  K 4.3 4.5 4.1  CL 103 103 102  CO2 20* 23 23  GLUCOSE 153* 140* 138*  BUN 8 8 11   CREATININE 0.84 0.78 1.02  CALCIUM 8.4* 8.8* 8.7*  PROT 5.2* 5.3* 5.0*  ALBUMIN 2.8* 3.0* 2.9*  AST 236* 490* PENDING  ALT 54* 78* 71*  ALKPHOS 45 50 47  BILITOT 0.8 0.7 1.1  GFRNONAA >60 >  60 >60  GFRAA >60 >60 >60  ANIONGAP 14 14 14      Hematology Recent Labs  Lab 06/18/19 1815 06/18/19 1957 06/19/19 0356  WBC 11.4* 12.7* 15.2*  RBC 4.22 4.58 4.27  HGB 11.0* 11.8* 10.9*  HCT 34.9* 38.1* 35.3*  MCV 82.7 83.2 82.7  MCH 26.1 25.8* 25.5*  MCHC 31.5 31.0 30.9  RDW 16.7* 17.0* 17.2*  PLT 262 283 264    BNPNo results for input(s): BNP, PROBNP in the last 168 hours.   DDimer No results for input(s): DDIMER in the last 168 hours.   Radiology    CARDIAC CATHETERIZATION  Result Date: 06/18/2019  Prox LAD to Mid LAD lesion is 100% stenosed.  A drug-eluting stent was successfully placed using a STENT RESOLUTE YYFR1.0Y11.  A drug-eluting stent was successfully placed using a STENT RESOLUTE ONYX 3.0X34.  Post intervention, there is a 0% residual stenosis.  1st Diag lesion is 100% stenosed.  Mid Cx to Dist Cx lesion is 50% stenosed.  Prox RCA to Mid RCA lesion is 30% stenosed.  There is severe left ventricular systolic dysfunction.  The  left ventricular ejection fraction is 25-35% by visual estimate.  LV end diastolic pressure is mildly elevated.  There is no mitral valve regurgitation.  1. 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 Severe diffuse disease in the vessel. S/p emergent stenting with DES x 2 with No reflow phenomenon. TIMI 2 flow restored. CTO of the first diagonal 2. Severe LV dysfunction with apical dyskinesia and anterolateral severe HK. EF 30-35% 3. Mildly elevated LVEDP Plan; DAPT with ASA and Brilinta for at least one year. Consider indefinite DAPT given PAD and long segment of stents in the LAD Will need beta blocker and ARB therapy +/- aldactone for LV dysfunction. Titrate as tolerated. Will continue IV Ntg overnight. Check Echo.    Cardiac Studies   Diagnostic Dominance: Right  Intervention     Patient Profile     78 y.o. male with h/o PVD, prior smoker, DM II, e. coli bacteremia, and PAD who presented late (> 6 hrs) with AS STEMI treated with DES and no reflow. Left BKA with wound vac.  Assessment & Plan    1. Anteroseptal STEMI complicated by no reflow due to late presentation. NO CHF or arrhythmia so far.  Plan high intensity statin therapy.  Long-acting nitrate therapy to help clear microcirculatory obstruction. 2. DM II we will need to check hemoglobin A1c. 3. PAD with left BKA monitor for evidence of recurrent infection. 4. Acute systolic HF with EF 17% and elevated EDP.  Start ARB and beta-blocker therapy as tolerated.  SGLT2 therapy may also be helpful.  2D Doppler echo to reassess LV function.We will check a baseline BNP. 5. Persistent ST elevation precordial leads with new right bundle branch block.  Guarded prognosis given PAD, amputation, and now and.  MI with acute systolic heart failure.     For questions or updates, please contact [place] Please consult [email or web address] for contact info under        Signed, Sinclair Grooms, MD  06/19/2019, 9:06 AM

## 2019-06-19 NOTE — Progress Notes (Addendum)
Earlier in the shift pt had femoral sheath removed, site initially level 0. Pt coughed some, now with minimal bleeding. Pressure held x5 minutes. Site now level 1 with bruising but no additional bleeding and site is soft. MD made aware.

## 2019-06-19 NOTE — Progress Notes (Signed)
   The patient developed third-degree heart block, driving heart rate into the 40 bpm range with associated reduction in blood pressure to 60/40 mmHg.  Still mentating.  Duration of complete heart block 22 minutes.  EKG during heart block did not demonstrate acute injury.  He received the first dose of carvedilol earlier this morning, 3.125 mg and losartan 12.5 mg.  Spontaneous recovery to heart rate of approximately 100 bpm.  Current blood pressure 88/60 mmHg.  IV saline 250 cc will be given.  EKG earlier this morning showed incomplete right bundle branch block with relatively short PR interval.  Impression: High-grade AV block in a patient with late presenting anterior myocardial infarction complicated by no reflow.  Suspect the prolonged AV block is a combination of conduction system ischemia/infarction and beta-blocker.  PLAN: Discontinue carvedilol; hold losartan; 250 cc of normal saline to control blood pressure; temporary transvenous pacemaker until further out from infarction.  Discussed the implications of high-grade AV block with the patient and his wife.  Also discussed the procedure and risks.

## 2019-06-19 NOTE — Progress Notes (Signed)
During ECHO, was called to the patients room by the ECHO tech who stated the patient sat straight up in bed and was disoriented.  Heart rate was found to be in the 40's.  EKG preformed and on call Dr paged.  On call Dr came to the bedside, ordered 215m saline bolus. New IV started. Patient was placed on the [facility] and waited for Cath lab team to arrive.  Transported to to cath lab uneventfully.  SKathleene HazelRN.

## 2019-06-19 NOTE — Progress Notes (Signed)
Chaplain responded for a page of urgent cath lab. Rodney Sullivan was in bed and tearful. Rodney Sullivan expressed fear with having to go to the cath lab. Chaplain prayed with Rodney Sullivan and then talked with him about family and life topics to keep his mind occupied until time to go to cath lab. As Rodney Sullivan was on his way to cath lab Chaplain spoke with and prayed with Rodney Sullivan' wife. His wife requested to have her daughter come and sit with her in the waiting area. Chaplain called the [facility] to see if that were a possibility. We ended up sticking with the hospital LSLHT-34 policy to keep as many people as possible safe. Chaplain remains available for support as needs arise.   Chaplain Resident, Evelene Croon, [place] [phone]

## 2019-06-20 ENCOUNTER — Inpatient Hospital Stay (HOSPITAL_COMMUNITY): Payer: Medicare HMO

## 2019-06-20 DIAGNOSIS — I2510 Atherosclerotic heart disease of native coronary artery without angina pectoris: Secondary | ICD-10-CM

## 2019-06-20 DIAGNOSIS — I442 Atrioventricular block, complete: Secondary | ICD-10-CM

## 2019-06-20 LAB — COOXEMETRY PANEL
Carboxyhemoglobin: 0 % — ABNORMAL LOW (ref 0.5–1.5)
Carboxyhemoglobin: 0.5 % (ref 0.5–1.5)
Carboxyhemoglobin: 0.1 % — ABNORMAL LOW (ref 0.5–1.5)
Methemoglobin: 2.6 % — ABNORMAL HIGH (ref 0.0–1.5)
Methemoglobin: 3.1 % — ABNORMAL HIGH (ref 0.0–1.5)
Methemoglobin: 2.9 % — ABNORMAL HIGH (ref 0.0–1.5)
O2 Saturation: 53.8 %
O2 Saturation: 52.6 %
O2 Saturation: 61.5 %
Total hemoglobin: 10.5 g/dL — ABNORMAL LOW (ref 12.0–16.0)
Total hemoglobin: 11.2 g/dL — ABNORMAL LOW (ref 12.0–16.0)
Total hemoglobin: 11.7 g/dL — ABNORMAL LOW (ref 12.0–16.0)

## 2019-06-20 LAB — BASIC METABOLIC PANEL
Anion gap: 14 (ref 5–15)
BUN: 18 mg/dL (ref 8–23)
CO2: 21 mmol/L — ABNORMAL LOW (ref 22–32)
Calcium: 8.6 mg/dL — ABNORMAL LOW (ref 8.9–10.3)
Chloride: 106 mmol/L (ref 98–111)
Creatinine, Ser: 1.1 mg/dL (ref 0.61–1.24)
GFR calc Af Amer: >60 mL/min (ref >60)
GFR calc non Af Amer: >60 mL/min (ref >60)
Glucose, Bld: 156 mg/dL — ABNORMAL HIGH (ref 70–99)
Potassium: 3.4 mmol/L — ABNORMAL LOW (ref 3.5–5.1)
Sodium: 141 mmol/L (ref 135–145)

## 2019-06-20 LAB — HEMOGLOBIN A1C
Hgb A1c MFr Bld: 7.3 % — ABNORMAL HIGH (ref 4.8–5.6)
Mean Plasma Glucose: 162.81 mg/dL

## 2019-06-20 LAB — GLUCOSE, CAPILLARY
Glucose-Capillary: 157 mg/dL — ABNORMAL HIGH (ref 70–99)
Glucose-Capillary: 199 mg/dL — ABNORMAL HIGH (ref 70–99)
Glucose-Capillary: 221 mg/dL — ABNORMAL HIGH (ref 70–99)
Glucose-Capillary: 238 mg/dL — ABNORMAL HIGH (ref 70–99)

## 2019-06-20 LAB — ECHOCARDIOGRAM COMPLETE: Weight: 2691.38 oz

## 2019-06-20 MED ORDER — SODIUM CHLORIDE 0.9 % IV SOLN: INTRAVENOUS | Status: DC | PRN

## 2019-06-20 MED ORDER — SPIRONOLACTONE 12.5 MG HALF TABLET
12.5000 mg | ORAL_TABLET | Freq: Every day | ORAL | Status: DC
  Filled 2019-06-20: qty 1

## 2019-06-20 MED ORDER — PERFLUTREN LIPID MICROSPHERE
1.0000 mL | INTRAVENOUS | Status: AC | PRN
  Administered 2019-06-20: 6 mL via INTRAVENOUS
  Filled 2019-06-20: qty 10

## 2019-06-20 MED ORDER — DOBUTAMINE IN D5W 4-5 MG/ML-% IV SOLN
1.5000 ug/kg/min | INTRAVENOUS | Status: DC
  Administered 2019-06-20: 2.5 ug/kg/min via INTRAVENOUS
  Filled 2019-06-20: qty 250

## 2019-06-20 MED ORDER — POTASSIUM CHLORIDE CRYS ER 20 MEQ PO TBCR
40.0000 meq | EXTENDED_RELEASE_TABLET | Freq: Once | ORAL | Status: AC
  Administered 2019-06-20: 40 meq via ORAL
  Filled 2019-06-20: qty 2

## 2019-06-20 NOTE — Progress Notes (Signed)
  Echocardiogram 2D Echocardiogram has been performed with Definity.  Rodney Sullivan 06/20/2019, 8:56 AM

## 2019-06-20 NOTE — Progress Notes (Signed)
Paged cardiologist on call Radford Pax MD)  for potassium replacement orders as needed. Pt's potassium is 3.4. awaiting call back

## 2019-06-20 NOTE — Consult Note (Addendum)
Advanced Heart Failure Team Consult Note   Primary Physician: Egbert Garibaldi, PA-C PCP-Cardiologist:  Peter Martinique, MD  Reason for Consultation: CHF, anterior MI  HPI:    Rodney Sullivan is seen today for evaluation of CHF at the request of Dr. Tamala Julian.   78 y.o. with history of extensive PAD, type 2 diabetes, prior PE/DVT, and Crohns disease was admitted with anterior MI.   Patient developed chest pain 12/11 while in the shower, went to see PCP.  ECG showed RBBB with anterior MI, he was sent to ER and subsequently take for emergent cath.  Cath showed acutely occluded mLAD and CTO D1.  LAD was opened with 2 DES but there was no-reflow.  No prior cardiac history.   On 12/12, he developed complete heart block and temporary transvenous pacer was placed.  RHC showed low output (CI 1.99 Fick) but PCWP 11.    Patient has extensive PAD, s/p left BKA with recent unsuccessful intervention on CTO right PT, has non-healing right foot wound.  He has Crohns disease on chronic prednisone.   Today, he is anxious and has some shortness of breath as well. He is pacing at 80, underlying CHB noted when rate decreased.  No chest pain.    I reviewed the echo done today.  Difficult images, there is apical and peri-apical severe akinesis, I think EF is more in the 35% range.  Good RV function.  There is a small pericardial effusion without tamponade.  On the contrast images, I do worry some about the possibility of an MI-related VSD though not confirmed on the non-contrast images with color doppler.  Co-ox 54% this morning.  Creatinine stable.   Review of Systems: All systems reviewed and negative except as per HPI.   Home Medications Prior to Admission medications   Medication Sig Start Date End Date Taking? Authorizing Provider  acetaminophen (TYLENOL) 500 MG tablet Take 500 mg by mouth every 6 (six) hours as needed (pain.).    Yes [provider]  albuterol (VENTOLIN HFA) 108 (90 Base) MCG/ACT  inhaler Inhale 2 puffs into the lungs every 4 (four) hours as needed for wheezing or shortness of breath.  11/20/18  Yes [provider]  allopurinol (ZYLOPRIM) 100 MG tablet Take 100 mg by mouth daily.   Yes [provider]  amLODipine (NORVASC) 2.5 MG tablet Take 2.5 mg by mouth daily with supper.  11/18/18  Yes [provider]  aspirin EC 81 MG tablet Take 81 mg by mouth daily with supper.    Yes [provider]  brimonidine-timolol (COMBIGAN) 0.2-0.5 % ophthalmic solution Place 1 drop into both eyes 2 (two) times a day.   Yes [provider]  Certolizumab Pegol (CIMZIA) 2 X 200 MG KIT Inject 400 mg into the skin every 28 (twenty-eight) days.   Yes [provider]  Cholecalciferol (VITAMIN D) 50 MCG (2000 UT) tablet Take 2,000 Units by mouth daily.   Yes [provider]  Cyanocobalamin (B-12) 2500 MCG TABS Take 2,500 mcg by mouth daily.   Yes [provider]  Dexamethasone Acetate 8 MG/ML SUSP Inject 8 mg into the muscle daily as needed (for Crohn's flare).   Yes [provider]  ferrous sulfate 325 (65 FE) MG tablet Take 325 mg by mouth daily.   Yes [provider]  latanoprost (XALATAN) 0.005 % ophthalmic solution Place 1 drop into both eyes at bedtime. 11/11/14  Yes [provider]  LORazepam (ATIVAN) 0.5 MG tablet  Take 0.5 mg by mouth daily as needed for anxiety. 05/27/19  Yes [provider]  mesalamine (PENTASA) 500 MG CR capsule Take 2,000 mg by mouth 2 (two) times daily.   Yes [provider]  Multiple Vitamin (MULTIVITAMIN WITH MINERALS) TABS Take 1 tablet by mouth daily.   Yes [provider]  Omega-3 Fatty Acids (FISH OIL) 1000 MG CAPS Take 1,000 mg by mouth daily.   Yes [provider]  pantoprazole (PROTONIX) 40 MG tablet Take 40 mg by mouth daily in the afternoon.    Yes [provider]  pioglitazone (ACTOS) 15 MG tablet Take 15 mg by mouth  daily.  01/13/19  Yes [provider]  pravastatin (PRAVACHOL) 10 MG tablet Take 1 tablet (10 mg total) by mouth every evening. Patient taking differently: Take 10 mg by mouth daily in the afternoon.  02/20/19 04/05/20 Yes Arrien, Jimmy Picket, MD  predniSONE (DELTASONE) 10 MG tablet Take 20 mg by mouth daily after breakfast. Total daily dose=22.5 mg (take with 0.5 tablet 25m tablet)   Yes [provider]  predniSONE (DELTASONE) 5 MG tablet Take 2.5 mg by mouth daily. Total daily dose=22.5 mg (take with #2-10 mg tablets)   Yes [provider]  traMADol (ULTRAM) 50 MG tablet Take 1 tablet (50 mg total) by mouth at bedtime as needed for moderate pain. Patient taking differently: Take 50 mg by mouth every 6 (six) hours as needed for moderate pain.  03/07/19  Yes Rhyne, SHulen Shouts PA-C    Past Medical History: Past Medical History:  Diagnosis Date  . Arthritis   . Bacteremia due to Escherichia coli 11/2013  . Cancer (HCC)    basal skin- arm, arm squamous  . Crohn disease ([facility]   . Diabetes mellitus without complication ([facility]   . DM (diabetes mellitus), type 2 with peripheral vascular complications ([place] 87/16/9678 . DVT (deep venous thrombosis) ([facility] 11/27/13  . Dyspnea    at times  . History of kidney stones    passed  . History of skin cancer    ARMS & FACE  . Peripheral arterial disease ([facility] 02/15/2019  . Pulmonary embolism ([facility] 11/27/13   . Shoulder problem    left torn ligament-   . STEMI (ST elevation myocardial infarction) ([facility] 06/18/2019    Past Surgical History: Past Surgical History:  Procedure Laterality Date  . ABDOMINAL AORTOGRAM N/A 12/14/2018   Procedure: ABDOMINAL AORTOGRAM;  Surgeon: CWaynetta Sandy MD;  [facility];  Service: Cardiovascular;  Laterality: N/A;  . ABDOMINAL AORTOGRAM W/LOWER EXTREMITY Bilateral 02/17/2019   Procedure: ABDOMINAL AORTOGRAM W/LOWER EXTREMITY;  Surgeon: CMarty Heck MD;   [facility];  Service: Cardiovascular;  Laterality: Bilateral;  . ABDOMINAL SURGERY     Bowel resection x2  . AMPUTATION Right 12/21/2018   Procedure: RIGHT LONG FINGER AMPUTATION;  Surgeon: KLeanora Cover MD;  [facility]  Service: Orthopedics;  Laterality: Right;  . AMPUTATION Left 03/03/2019   Procedure: AMPUTATION BELOW KNEE;  Surgeon: CMarty Heck MD;  [facility]  Service: Vascular;  Laterality: Left;  . APPLICATION OF WOUND VAC Left 04/09/2019   Procedure: APPLICATION OF WOUND VAC;  Surgeon: CMarty Heck MD;  [facility]  Service: Vascular;  Laterality: Left;  . CHOLECYSTECTOMY    . COLON SURGERY    . HIP SURGERY Bilateral    replacement  . JOINT REPLACEMENT    . LOWER EXTREMITY ANGIOGRAPHY Right  12/14/2018   Procedure: LOWER EXTREMITY ANGIOGRAPHY;  Surgeon: Waynetta Sandy, MD;  Location: [facility];  Service: Cardiovascular;  Laterality: Right;  . LOWER EXTREMITY ANGIOGRAPHY N/A 06/09/2019   Procedure: LOWER EXTREMITY ANGIOGRAPHY;  Surgeon: [name], MD;  Location: [facility];  Service: Cardiovascular;  Laterality: N/A;  . PERIPHERAL VASCULAR ATHERECTOMY  02/17/2019   Procedure: PERIPHERAL VASCULAR ATHERECTOMY;  Surgeon: [name], MD;  Location: [facility];  Service: Cardiovascular;;  . UPPER EXTREMITY ANGIOGRAPHY Right 12/14/2018   Procedure: Right UPPER EXTREMITY ANGIOGRAPHY;  Surgeon: Waynetta Sandy, MD;  Location: [facility];  Service: Cardiovascular;  Laterality: Right;  . WOUND DEBRIDEMENT Left 04/09/2019   Procedure: DEBRIDEMENT WOUND LEFT BELOW KNEE STUMP;  Surgeon: [name], MD;  Location[facility];  Service: Vascular;  Laterality: Left;    Family History: Family History  Problem Relation Age of Onset  . Hypertension Mother     Social History: Social History   Socioeconomic History  . Marital status: Married    Spouse  name: Not on file  . Number of children: Not on file  . Years of education: Not on file  . Highest education level: Not on file  Occupational History  . Not on file  Tobacco Use  . Smoking status: Former Smoker    Years: 10.00    Types: Pipe  . Smokeless tobacco: Former Systems developer    Quit date: 12/08/1979  Substance and Sexual Activity  . Alcohol use: No    Alcohol/week: 0.0 standard drinks  . Drug use: No  . Sexual activity: Not Currently  Other Topics Concern  . Not on file  Social History Narrative  . Not on file   Social Determinants of Health   Financial Resource Strain:   . Difficulty of Paying Living Expenses: Not on file  Food Insecurity:   . Worried About Charity fundraiser in the Last Year: Not on file  . Ran Out of Food in the Last Year: Not on file  Transportation Needs:   . Lack of Transportation (Medical): Not on file  . Lack of Transportation (Non-Medical): Not on file  Physical Activity:   . Days of Exercise per Week: Not on file  . Minutes of Exercise per Session: Not on file  Stress:   . Feeling of Stress : Not on file  Social Connections:   . Frequency of Communication with Friends and Family: Not on file  . Frequency of Social Gatherings with Friends and Family: Not on file  . Attends Religious Services: Not on file  . Active Member of Clubs or Organizations: Not on file  . Attends Archivist Meetings: Not on file  . Marital Status: Not on file    Allergies:  Allergies  Allergen Reactions  . Ciprofloxacin Other (See Comments)    tendons hurt  . Shrimp [Shellfish Allergy]     Objective:    Vital Signs:   Temp:  [98.2 F (36.8 C)-99.3 F (37.4 C)] 98.7 F (37.1 C) (12/13 1104) Pulse Rate:  [0-105] 79 (12/13 0800) Resp:  [7-53] 17 (12/13 0800) BP: (64-141)/(46-125) 95/58 (12/13 0800) SpO2:  [87 %-99 %] 96 % (12/13 0800) Weight:  [76.3 kg] 76.3 kg (12/13 0340)    Weight change: Filed Weights   06/19/19 0500 06/20/19 0340    Weight: 77 kg 76.3 kg    Intake/Output:   Intake/Output Summary (Last 24 hours) at 06/20/2019 1236 Last data  filed at 06/20/2019 0547 Gross per 24 hour  Intake 412.04 ml  Output 625 ml  Net -212.96 ml      Physical Exam    General:  Anxious. No resp difficulty HEENT: normal Neck: supple. JVP 9-10 cm. Carotids 2+ bilat; no bruits. No lymphadenopathy or thyromegaly appreciated. Cor: Distant heart sounds, regular.  I cannot hear a murmur. Lungs: clear Abdomen: soft, nontender, nondistended. No hepatosplenomegaly. No bruits or masses. Good bowel sounds. Extremities: no cyanosis, clubbing, rash, edema. Right groin temporary transvenous pacemaker.  Neuro: alert & orientedx3, cranial nerves grossly intact. moves all 4 extremities w/o difficulty. Affect pleasant   Telemetry   V-paced (personally reviewed).   EKG    NSR, RBBB, acute anterior MI (12/11)  Labs   Basic Metabolic Panel: Recent Labs  Lab 06/18/19 1815 06/18/19 1957 06/19/19 0356 06/20/19 0339  NA 137 140 139 141  K 4.3 4.5 4.1 3.4*  CL 103 103 102 106  CO2 20* 23 23 21*  GLUCOSE 153* 140* 138* 156*  BUN _0 CREATININE 0.84 0.78 1.02 1.10  CALCIUM 8.4* 8.8* 8.7* 8.6*    Liver Function Tests: Recent Labs  Lab 06/18/19 1815 06/18/19 1957 06/19/19 0356  AST 236* 490* 332*  ALT 54* 78* 71*  ALKPHOS 45 50 47  BILITOT 0.8 0.7 1.1  PROT 5.2* 5.3* 5.0*  ALBUMIN 2.8* 3.0* 2.9*   No results for input(s): LIPASE, AMYLASE in the last 168 hours. No results for input(s): AMMONIA in the last 168 hours.  CBC: Recent Labs  Lab 06/18/19 1815 06/18/19 1957 06/19/19 0356  WBC 11.4* 12.7* 15.2*  NEUTROABS  --  8.4*  --   HGB 11.0* 11.8* 10.9*  HCT 34.9* 38.1* 35.3*  MCV 82.7 83.2 82.7  PLT 262 283 264    Cardiac Enzymes: No results for input(s): CKTOTAL, CKMB, CKMBINDEX, TROPONINI in the last 168 hours.  BNP: BNP (last 3 results) Recent Labs    06/19/19 0356  BNP 268.9*    ProBNP  (last 3 results) No results for input(s): PROBNP in the last 8760 hours.   CBG: Recent Labs  Lab 06/19/19 1111 06/19/19 1609 06/19/19 2125 06/20/19 0735 06/20/19 1102  GLUCAP 213* 167* 147* 157* 199*    Coagulation Studies: Recent Labs    06/18/19 1815  LABPROT 17.3*  INR 1.4*     Imaging   CARDIAC CATHETERIZATION  Result Date: 06/19/2019 1.  Successful placement of a transvenous pacing wire from right femoral vein access under fluoroscopic guidance 2.  Right heart catheterization demonstrating hemodynamic findings consistent with low/normal intracardiac filling pressures and low cardiac output (cardiac index 1.99 by Fick and 1.74 by thermodilution) Plan: Continue supportive care.  Volume resuscitation and inotropic therapy with low-dose norepinephrine.  ECHOCARDIOGRAM COMPLETE  Result Date: 06/20/2019   ECHOCARDIOGRAM REPORT   Patient Name:   Rodney Sullivan Date of Exam: 06/20/2019 Medical Rec #:  [id number]      Height:       67.0 in Accession #:    [id number]     Weight:       168.2 lb Date of Birth:  December 28, 1940      BSA:          1.88 m Patient Age:    30 years       BP:           94/68 mmHg Patient Gender: M              HR:  82 bpm. Exam Location:  Inpatient Procedure: 2D Echo and Intracardiac Opacification Agent Indications:    414.01/I25.10  History:        Patient has prior history of Echocardiogram examinations, most                 recent 06/19/2019. Risk Factors:Hypertension, Diabetes and                 Dyslipidemia.  Sonographer:    Clayton Lefort RDCS (AE) Referring Phys: 1993 [facility] G BARRETT  Sonographer Comments: Technically challenging study due to limited acoustic windows, Technically difficult study due to poor echo windows, suboptimal parasternal window and suboptimal apical window. IMPRESSIONS  1. Left ventricular ejection fraction, by visual estimation, is 50 to 55%. The left ventricle has normal function. There is severely increased left ventricular  hypertrophy.  2. Definity contrast agent was given IV to delineate the left ventricular endocardial borders.  3. Left ventricular diastolic parameters are indeterminate.  4. LV poorly visualized even with contrast. The apex, anterior, and anteroseptal walls appear to be hypokinetic.  5. Global right ventricle has normal systolic function.The right ventricular size is not well visualized. No increase in right ventricular wall thickness.  6. Left atrial size was normal.  7. Right atrial size was normal.  8. Small pericardial effusion, circumferential and partially loculated. No signs of tamponade physiology.  9. The pericardial effusion is circumferential. 10. The mitral valve is normal in structure. No evidence of mitral valve regurgitation. No evidence of mitral stenosis. 11. The tricuspid valve is normal in structure. Tricuspid valve regurgitation is not demonstrated. 12. The aortic valve has an indeterminant number of cusps. Aortic valve regurgitation is not visualized. No evidence of aortic valve sclerosis or stenosis. 13. Pulmonic regurgitation is mild. 14. The pulmonic valve was not well visualized. Pulmonic valve regurgitation is mild. 15. The inferior vena cava is normal in size with greater than 50% respiratory variability, suggesting right atrial pressure of 3 mmHg. 16. The interatrial septum was not well visualized. FINDINGS  Left Ventricle: Left ventricular ejection fraction, by visual estimation, is 50 to 55%. The left ventricle has normal function. Definity contrast agent was given IV to delineate the left ventricular endocardial borders. The left ventricle is not well visualized. There is severely increased left ventricular hypertrophy. Left ventricular diastolic parameters are indeterminate. LV poorly visualized even with contrast. The apex, anterior, and anteroseptal walls appear to be hypokinetic. Right Ventricle: The right ventricular size is not well visualized. No increase in right ventricular  wall thickness. Global RV systolic function is has normal systolic function. Left Atrium: Left atrial size was normal in size. Right Atrium: Right atrial size was normal in size Pericardium: A small pericardial effusion is present. The pericardial effusion is circumferential. The pericardial effusion appears to contain partially loculated effusion. Mitral Valve: The mitral valve is normal in structure. There is mild thickening of the mitral valve leaflet(s). No evidence of mitral valve regurgitation. No evidence of mitral valve stenosis by observation. Tricuspid Valve: The tricuspid valve is normal in structure. Tricuspid valve regurgitation is not demonstrated. Aortic Valve: The aortic valve has an indeterminant number of cusps. Aortic valve regurgitation is not visualized. The aortic valve is structurally normal, with no evidence of sclerosis or stenosis. Mild aortic valve annular calcification. There is mild calcification of the aortic valve. Aortic valve mean gradient measures 4.9 mmHg. Aortic valve peak gradient measures 9.1 mmHg. Aortic valve area, by VTI measures 3.00 cm. Pulmonic Valve: The pulmonic valve  was not well visualized. Pulmonic valve regurgitation is mild. Pulmonic regurgitation is mild. No evidence of pulmonic stenosis. Aorta: The aortic root is normal in size and structure. Pulmonary Artery: Indeterminate PASP, inadequate TR jet. Venous: The inferior vena cava is normal in size with greater than 50% respiratory variability, suggesting right atrial pressure of 3 mmHg. IAS/Shunts: The interatrial septum was not well visualized.  LEFT VENTRICLE PLAX 2D LVIDd:         3.30 cm LVIDs:         2.40 cm LV PW:         2.20 cm LV IVS:        2.10 cm LVOT diam:     2.00 cm LV SV:         24 ml LV SV Index:   12.53 LVOT Area:     3.14 cm  RIGHT VENTRICLE            IVC RV Basal diam:  2.10 cm    IVC diam: 1.80 cm RV S prime:     9.79 cm/s TAPSE (M-mode): 2.2 cm LEFT ATRIUM           Index       RIGHT  ATRIUM           Index LA diam:      2.30 cm 1.22 cm/m  RA Area:     10.50 cm LA Vol (A2C): 16.7 ml 8.89 ml/m  RA Volume:   20.70 ml  11.02 ml/m LA Vol (A4C): 26.2 ml 13.94 ml/m  AORTIC VALVE AV Area (Vmax):    2.80 cm AV Area (Vmean):   2.81 cm AV Area (VTI):     3.00 cm AV Vmax:           150.64 cm/s AV Vmean:          103.079 cm/s AV VTI:            0.245 m AV Peak Grad:      9.1 mmHg AV Mean Grad:      4.9 mmHg LVOT Vmax:         134.20 cm/s LVOT Vmean:        92.140 cm/s LVOT VTI:          0.233 m LVOT/AV VTI ratio: 0.95  AORTA Ao Root diam: 3.50 cm Ao Asc diam:  3.00 cm  SHUNTS Systemic VTI:  0.23 m Systemic Diam: 2.00 cm  Carlyle Dolly MD Electronically signed by Carlyle Dolly MD Signature Date/Time: 06/20/2019/10:13:41 AM    Final       Medications:     Current Medications: . allopurinol  100 mg Oral Daily  . aspirin EC  81 mg Oral Q supper  . atorvastatin  80 mg Oral q1800  . brimonidine  1 drop Both Eyes Q12H   And  . timolol  1 drop Both Eyes Q12H  . Chlorhexidine Gluconate Cloth  6 each Topical Daily  . cholecalciferol  2,000 Units Oral Daily  . enoxaparin (LOVENOX) injection  40 mg Subcutaneous Q24H  . ferrous sulfate  325 mg Oral Daily  . insulin aspart  0-5 Units Subcutaneous QHS  . insulin aspart  0-9 Units Subcutaneous TID WC  . latanoprost  1 drop Both Eyes QHS  . losartan  12.5 mg Oral Daily  . mesalamine  2,000 mg Oral BID  . multivitamin with minerals  1 tablet Oral Daily  . omega-3 acid ethyl esters  1 g Oral Daily  .  pantoprazole  40 mg Oral Q1500  . predniSONE  22.5 mg Oral Q breakfast  . sodium chloride flush  3 mL Intravenous Q12H  . spironolactone  12.5 mg Oral Daily  . ticagrelor  90 mg Oral BID  . vitamin B-12  2,000 mcg Oral Daily     Infusions: . sodium chloride    . nitroGLYCERIN Stopped (06/19/19 0920)  . norepinephrine (LEVOPHED) Adult infusion Stopped (06/20/19 3979)      Assessment/Plan   1. CAD: Acute anteroseptal MI,  somewhat delayed presentation.  Cath with acutely occluded mLAD and CTO D1.  DES x 2 to LAD, but no-reflow.  - Continue ASA 81 and ticagrelor 90 bid.  - Atorvastatin 80 mg daily.  2. Acute systolic CHF: Ischemic cardiomyopathy.  I reviewed today's echo (images difficult), think EF is closer to 35% with apical/peri-apical severe hypokinesis to akinesis.  On contrast images, I cannot rule out post-MI VSD, and also small pericardial effusion is concerning.  SBP running generally 90s with co-ox 54% this morning.  CVP 8-9.  - Hold off on Lasix for now.  - Repeat co-ox, will start dobutamine 2.5 if co-ox <55%.   - Can continue losartan 12.5 mg daily for now, would not add additional meds with soft BP.  - He will need permanent pacemaker, will need CRT device (d/w EP regarding defibrillator).  - ? If MI-related VSD is present in septum, will repeat limited echo to look specifically for this.  I cannot hear a murmur but heart sounds are very quiet.  3. Diabetes: Would stop pioglitazone with CHF. Eventual SGLT2 inhibitor.  4. PAD: s/p left BKA, nonhealing ulcer on right.  5. Crohns disease: Has been on chronic prednisone, continued here.  Higher risk of mechanical complication of MI with prednisone.  6. Complete heart block: Complication of anterior MI with no-reflow. Has TTVP with underlying CHB still today.  - Suspect he will need PPM, should have CRT device.   Length of Stay: 2  Loralie Champagne, MD  06/20/2019, 12:36 PM  Advanced Heart Failure Team Pager [phone] (M-F; 7a - 4p)  Please contact [facility] for night-coverage after hours (4p -7a ) and weekends on [email or web address]

## 2019-06-20 NOTE — Consult Note (Signed)
Cardiology Consultation:   Patient ID: Rodney Sullivan MRN: [id number]; DOB: [birth year]  Admit date: 06/18/2019 Date of Consult: 06/20/2019  Primary Care Provider: Egbert Garibaldi, PA-C Primary Cardiologist: Peter Martinique, MD  Primary Electrophysiologist:  None new   Patient Profile:   Rodney Sullivan is a 78 y.o. male with a hx of peripheral vascular disease, HTN who is being seen today for the evaluation of CHB at the request of Dr. Tamala Julian.  History of Present Illness:   Mr. [name] presented late from an anterior MI and with new RBBB. He underwent PCI and had no/little reflow. He then developed CHB and severe hypotension and underwent RHC/ temporary PM insertion. He improved. He has severe peripheral vascular disease and failed PCI of a CTO on the right and a left BKA. He has some dyspnea but denies chest pain. His conduction remains blocked as I temporarily turned down his temporary PM and he had only ventricular pacing.   Heart Pathway Score:     Past Medical History:  Diagnosis Date  . Arthritis   . Bacteremia due to Escherichia coli 11/2013  . Cancer (HCC)    basal skin- arm, arm squamous  . Crohn disease ([place])   . Diabetes mellitus without complication ([place])   . DM (diabetes mellitus), type 2 with peripheral vascular complications ([place]) 1/74/0814  . DVT (deep venous thrombosis) ([place]) 11/27/13  . Dyspnea    at times  . History of kidney stones    passed  . History of skin cancer    ARMS & FACE  . Peripheral arterial disease ([place]) 02/15/2019  . Pulmonary embolism ([place]) 11/27/13   . Shoulder problem    left torn ligament-   . STEMI (ST elevation myocardial infarction) ([place]) 06/18/2019    Past Surgical History:  Procedure Laterality Date  . ABDOMINAL AORTOGRAM N/A 12/14/2018   Procedure: ABDOMINAL AORTOGRAM;  Surgeon: Waynetta Sandy, MD;  Location: [facility];  Service: Cardiovascular;  Laterality: N/A;  . ABDOMINAL AORTOGRAM W/LOWER EXTREMITY Bilateral  02/17/2019   Procedure: ABDOMINAL AORTOGRAM W/LOWER EXTREMITY;  Surgeon: [name], MD;  Location: [facility];  Service: Cardiovascular;  Laterality: Bilateral;  . ABDOMINAL SURGERY     Bowel resection x2  . AMPUTATION Right 12/21/2018   Procedure: RIGHT LONG FINGER AMPUTATION;  Surgeon: Leanora Cover, MD;  Location: [facility];  Service: Orthopedics;  Laterality: Right;  . AMPUTATION Left 03/03/2019   Procedure: AMPUTATION BELOW KNEE;  Surgeon: [name], MD;  Location: [facility];  Service: Vascular;  Laterality: Left;  . APPLICATION OF WOUND VAC Left 04/09/2019   Procedure: APPLICATION OF WOUND VAC;  Surgeon: [name], MD;  Location: [facility];  Service: Vascular;  Laterality: Left;  . CHOLECYSTECTOMY    . COLON SURGERY    . HIP SURGERY Bilateral    replacement  . JOINT REPLACEMENT    . LOWER EXTREMITY ANGIOGRAPHY Right 12/14/2018   Procedure: LOWER EXTREMITY ANGIOGRAPHY;  Surgeon: Waynetta Sandy, MD;  Location: [facility];  Service: Cardiovascular;  Laterality: Right;  . LOWER EXTREMITY ANGIOGRAPHY N/A 06/09/2019   Procedure: LOWER EXTREMITY ANGIOGRAPHY;  Surgeon: [name], MD;  Location: [facility];  Service: Cardiovascular;  Laterality: N/A;  . PERIPHERAL VASCULAR ATHERECTOMY  02/17/2019   Procedure: PERIPHERAL VASCULAR ATHERECTOMY;  Surgeon: [name], MD;  Location: [facility];  Service: Cardiovascular;;  . UPPER EXTREMITY ANGIOGRAPHY Right 12/14/2018   Procedure: Right UPPER EXTREMITY ANGIOGRAPHY;  Surgeon: Waynetta Sandy, MD;  Location: [facility];  Service: Cardiovascular;  Laterality: Right;  . WOUND DEBRIDEMENT Left 04/09/2019   Procedure: DEBRIDEMENT WOUND LEFT BELOW KNEE STUMP;  Surgeon: [name], MD;  Location: [facility];  Service: Vascular;  Laterality: Left;     Home Medications:  Prior to Admission medications   Medication Sig Start Date End Date  Taking? Authorizing Provider  acetaminophen (TYLENOL) 500 MG tablet Take 500 mg by mouth every 6 (six) hours as needed (pain.).    Yes [provider]  albuterol (VENTOLIN HFA) 108 (90 Base) MCG/ACT inhaler Inhale 2 puffs into the lungs every 4 (four) hours as needed for wheezing or shortness of breath.  11/20/18  Yes [provider]  allopurinol (ZYLOPRIM) 100 MG tablet Take 100 mg by mouth daily.   Yes [provider]  amLODipine (NORVASC) 2.5 MG tablet Take 2.5 mg by mouth daily with supper.  11/18/18  Yes [provider]  aspirin EC 81 MG tablet Take 81 mg by mouth daily with supper.    Yes [provider]  brimonidine-timolol (COMBIGAN) 0.2-0.5 % ophthalmic solution Place 1 drop into both eyes 2 (two) times a day.   Yes [provider]  Certolizumab Pegol (CIMZIA) 2 X 200 MG KIT Inject 400 mg into the skin every 28 (twenty-eight) days.   Yes [provider]  Cholecalciferol (VITAMIN D) 50 MCG (2000 UT) tablet Take 2,000 Units by mouth daily.   Yes [provider]  Cyanocobalamin (B-12) 2500 MCG TABS Take 2,500 mcg by mouth daily.   Yes [provider]  Dexamethasone Acetate 8 MG/ML SUSP Inject 8 mg into the muscle daily as needed (for Crohn's flare).   Yes [provider]  ferrous sulfate 325 (65 FE) MG tablet Take 325 mg by mouth daily.   Yes [provider]  latanoprost (XALATAN) 0.005 % ophthalmic solution Place 1 drop into both eyes at bedtime. 11/11/14  Yes [provider]  LORazepam (ATIVAN) 0.5 MG tablet Take 0.5 mg by mouth daily as needed for anxiety. 05/27/19  Yes [provider]  mesalamine (PENTASA) 500 MG CR capsule Take 2,000 mg by mouth 2 (two) times daily.   Yes [provider]  Multiple Vitamin (MULTIVITAMIN WITH MINERALS) TABS Take 1 tablet by mouth daily.   Yes [provider]  Omega-3 Fatty Acids (FISH OIL) 1000 MG CAPS Take 1,000 mg by mouth  daily.   Yes [provider]  pantoprazole (PROTONIX) 40 MG tablet Take 40 mg by mouth daily in the afternoon.    Yes [provider]  pioglitazone (ACTOS) 15 MG tablet Take 15 mg by mouth daily.  01/13/19  Yes [provider]  pravastatin (PRAVACHOL) 10 MG tablet Take 1 tablet (10 mg total) by mouth every evening. Patient taking differently: Take 10 mg by mouth daily in the afternoon.  02/20/19 04/05/20 Yes Arrien, Jimmy Picket, MD  predniSONE (DELTASONE) 10 MG tablet Take 20 mg by mouth daily after breakfast. Total daily dose=22.5 mg (take with 0.5 tablet 24m tablet)   Yes [provider]  predniSONE (DELTASONE) 5 MG tablet Take 2.5 mg by mouth daily. Total daily dose=22.5 mg (take with #2-10 mg tablets)   Yes [provider]  traMADol (ULTRAM) 50 MG tablet Take 1 tablet (50 mg total) by mouth at bedtime as needed for moderate pain. Patient taking differently: Take 50 mg by mouth every 6 (six) hours as needed for moderate pain.  03/07/19  Yes Rhyne, Hulen Shouts, PA-C    Inpatient Medications: Scheduled Meds: . allopurinol  100 mg Oral Daily  . aspirin EC  81 mg Oral Q supper  . atorvastatin  80 mg Oral q1800  . brimonidine  1 drop Both Eyes Q12H   And  . timolol  1 drop Both Eyes Q12H  . carvedilol  3.125 mg Oral BID WC  . Chlorhexidine Gluconate Cloth  6 each Topical Daily  . cholecalciferol  2,000 Units Oral Daily  . enoxaparin (LOVENOX) injection  40 mg Subcutaneous Q24H  . ferrous sulfate  325 mg Oral Daily  . insulin aspart  0-5 Units Subcutaneous QHS  . insulin aspart  0-9 Units Subcutaneous TID WC  . latanoprost  1 drop Both Eyes QHS  . losartan  12.5 mg Oral Daily  . mesalamine  2,000 mg Oral BID  . multivitamin with minerals  1 tablet Oral Daily  . omega-3 acid ethyl esters  1 g Oral Daily  . pantoprazole  40 mg Oral Q1500  . pioglitazone  15 mg Oral Daily  . predniSONE  22.5 mg Oral Q breakfast  . sodium chloride flush  3 mL  Intravenous Q12H  . ticagrelor  90 mg Oral BID  . vitamin B-12  2,000 mcg Oral Daily   Continuous Infusions: . sodium chloride    . nitroGLYCERIN Stopped (06/19/19 0920)  . norepinephrine (LEVOPHED) Adult infusion Stopped (06/20/19 0209)   PRN Meds: sodium chloride, acetaminophen, albuterol, ALPRAZolam, LORazepam, nitroGLYCERIN, ondansetron (ZOFRAN) IV, perflutren lipid microspheres (DEFINITY) IV suspension, sodium chloride flush, traMADol, zolpidem  Allergies:    Allergies  Allergen Reactions  . Ciprofloxacin Other (See Comments)    tendons hurt  . Shrimp [Shellfish Allergy]     Social History:   Social History   Socioeconomic History  . Marital status: Married    Spouse name: Not on file  . Number of children: Not on file  . Years of education: Not on file  . Highest education level: Not on file  Occupational History  . Not on file  Tobacco Use  . Smoking status: Former Smoker    Years: 10.00    Types: Pipe  . Smokeless tobacco: Former Systems developer    Quit date: 12/08/1979  Substance and Sexual Activity  . Alcohol use: No    Alcohol/week: 0.0 standard drinks  . Drug use: No  . Sexual activity: Not Currently  Other Topics Concern  . Not on file  Social History Narrative  . Not on file   Social Determinants of Health   Financial Resource Strain:   . Difficulty of Paying Living Expenses: Not on file  Food Insecurity:   . Worried About Charity fundraiser in the Last Year: Not on file  . Ran Out of Food in the Last Year: Not on file  Transportation Needs:   . Lack of Transportation (Medical): Not on file  . Lack of Transportation (Non-Medical): Not on file  Physical Activity:   . Days of Exercise per Week: Not on file  . Minutes of Exercise per Session: Not on file  Stress:   . Feeling of Stress : Not on file  Social Connections:   . Frequency of Communication with Friends and Family: Not on file  . Frequency of Social Gatherings with Friends and Family: Not on  file  . Attends Religious Services: Not on file  . Active Member of Clubs or Organizations: Not on file  . Attends Club or  Organization Meetings: Not on file  . Marital Status: Not on file  Intimate Partner Violence:   . Fear of Current or Ex-Partner: Not on file  . Emotionally Abused: Not on file  . Physically Abused: Not on file  . Sexually Abused: Not on file    Family History:    Family History  Problem Relation Age of Onset  . Hypertension Mother      ROS:  Please see the history of present illness.   All other ROS reviewed and negative.     Physical Exam/Data:   Vitals:   06/20/19 0600 06/20/19 0700 06/20/19 0736 06/20/19 0800  BP: (!) 106/55 94/68  (!) 95/58  Pulse: 80 82  79  Resp: 18 (!) 21  17  Temp:   99.3 F (37.4 C)   TempSrc:   Oral   SpO2: 96% 98%  96%  Weight:        Intake/Output Summary (Last 24 hours) at 06/20/2019 0901 Last data filed at 06/20/2019 0547 Gross per 24 hour  Intake 652.04 ml  Output 625 ml  Net 27.04 ml   Last 3 Weights 06/20/2019 06/19/2019 06/09/2019  Weight (lbs) 168 lb 3.4 oz 169 lb 12.1 oz 165 lb  Weight (kg) 76.3 kg 77 kg 74.844 kg     Body mass index is 26.35 kg/m.  General:  Stable but chronically ill appearing HEENT: normal Lymph: no adenopathy Neck: no JVD Endocrine:  No thryomegaly Vascular: No carotid bruits; FA pulses 2+ bilaterally without bruits  Cardiac:  normal S1, S2; RRR; no murmur  Lungs:  clear to auscultation bilaterally, no wheezing, rhonchi or rales  Abd: soft, nontender, no hepatomegaly  Ext: no edema Musculoskeletal:  No deformities, BUE and BLE strength normal and equal Skin: warm and dry  Neuro:  CNs 2-12 intact, no focal abnormalities noted Psych:  Normal affect   EKG:  The EKG was personally reviewed and demonstrates:  nsr with asynchronous ventricular pacing Telemetry:  Telemetry was personally reviewed and demonstrates:  NSR with ventricular pacing  Relevant CV Studies: 2D echo with  multiple EF's.  Laboratory Data:  High Sensitivity Troponin:   Recent Labs  Lab 06/18/19 1815 06/18/19 1957 06/18/19 2207  TROPONINIHS >27,000* >27,000* >27,000*     Chemistry Recent Labs  Lab 06/18/19 1957 06/19/19 0356 06/20/19 0339  NA 140 139 141  K 4.5 4.1 3.4*  CL 103 102 106  CO2 23 23 21*  GLUCOSE 140* 138* 156*  BUN 8 11 18   CREATININE 0.78 1.02 1.10  CALCIUM 8.8* 8.7* 8.6*  GFRNONAA >60 >60 >60  GFRAA >60 >60 >60  ANIONGAP 14 14 14     Recent Labs  Lab 06/18/19 1815 06/18/19 1957 06/19/19 0356  PROT 5.2* 5.3* 5.0*  ALBUMIN 2.8* 3.0* 2.9*  AST 236* 490* 332*  ALT 54* 78* 71*  ALKPHOS 45 50 47  BILITOT 0.8 0.7 1.1   Hematology Recent Labs  Lab 06/18/19 1815 06/18/19 1957 06/19/19 0356  WBC 11.4* 12.7* 15.2*  RBC 4.22 4.58 4.27  HGB 11.0* 11.8* 10.9*  HCT 34.9* 38.1* 35.3*  MCV 82.7 83.2 82.7  MCH 26.1 25.8* 25.5*  MCHC 31.5 31.0 30.9  RDW 16.7* 17.0* 17.2*  PLT 262 283 264   BNP Recent Labs  Lab 06/19/19 0356  BNP 268.9*    DDimer No results for input(s): DDIMER in the last 168 hours.   Radiology/Studies:  CARDIAC CATHETERIZATION  Result Date: 06/19/2019 1.  Successful placement of a transvenous pacing wire  from right femoral vein access under fluoroscopic guidance 2.  Right heart catheterization demonstrating hemodynamic findings consistent with low/normal intracardiac filling pressures and low cardiac output (cardiac index 1.99 by Fick and 1.74 by thermodilution) Plan: Continue supportive care.  Volume resuscitation and inotropic therapy with low-dose norepinephrine.  CARDIAC CATHETERIZATION  Result Date: 06/18/2019  Prox LAD to Mid LAD lesion is 100% stenosed.  A drug-eluting stent was successfully placed using a STENT RESOLUTE [id number].  A drug-eluting stent was successfully placed using a STENT RESOLUTE [id number] [id number].  Post intervention, there is a 0% residual stenosis.  1st Diag lesion is 100% stenosed.  Mid Cx to Dist Cx  lesion is 50% stenosed.  Prox RCA to Mid RCA lesion is 30% stenosed.  There is severe left ventricular systolic dysfunction.  The left ventricular ejection fraction is 25-35% by visual estimate.  LV end diastolic pressure is mildly elevated.  There is no mitral valve regurgitation.  1. 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 Severe diffuse disease in the vessel. S/p emergent stenting with DES x 2 with No reflow phenomenon. TIMI 2 flow restored. CTO of the first diagonal 2. Severe LV dysfunction with apical dyskinesia and anterolateral severe HK. EF 30-35% 3. Mildly elevated LVEDP Plan; DAPT with ASA and Brilinta for at least one year. Consider indefinite DAPT given PAD and long segment of stents in the LAD Will need beta blocker and ARB therapy +/- aldactone for LV dysfunction. Titrate as tolerated. Will continue IV Ntg overnight. Check Echo.   ECHOCARDIOGRAM COMPLETE  Result Date: 06/19/2019   ECHOCARDIOGRAM REPORT   Patient Name:   SORIN FRIMPONG Date of Exam: 06/19/2019 Medical Rec #:  [id number]      Height:       67.0 in Accession #:    [id number]     Weight:       169.8 lb Date of Birth:  07-Aug-1940      BSA:          1.89 m Patient Age:    31 years       BP:           94/64 mmHg Patient Gender: M              HR:           95 bpm. Exam Location:  Inpatient Procedure: 2D Echo Indications:    CAD Native Vessel 414.01/I25.10  History:        Patient has no prior history of Echocardiogram examinations.                 Risk Factors:Diabetes, Hypertension and Dyslipidemia.  Sonographer:    Clayton Lefort RDCS (AE) Referring Phys: [phone] Ander Slade Martinique  Sonographer Comments: Echo stopped while in progress due to change in patient condition. Nurse notified and stopped at suggestion of nurse. IMPRESSIONS  1. Left ventricular ejection fraction, by visual estimation, is 40 to 45%. The left ventricle has hyperdynamic function. There is severely increased left ventricular hypertrophy. IVSd measures  2.2 cm - consider HCM or infiltrative cardiomyopathy.  2. The left ventricle demonstrates regional wall motion abnormalities.  3. Severe hypokinesis of the left ventricular, entire anteroseptal wall and anterior wall.  4. Severe hypokinesis of the left ventricular, entire septal wall.  5. Global right ventricle has normal systolic function.The right ventricular size is normal. No increase in right ventricular wall thickness.  6. Left atrial size was not well visualized.  7. Right atrial size was not  well visualized.  8. The interatrial septum was not assessed.  9. The ascending aorta was not well visualized. 10. The aortic valve is tricuspid. Aortic valve regurgitation is not visualized. 11. The mitral valve is grossly normal. Trivial mitral valve regurgitation. 12. The tricuspid valve is grossly normal. Tricuspid valve regurgitation is trivial. 13. The pulmonic valve was not well visualized. Pulmonic valve regurgitation is not visualized. FINDINGS  Left Ventricle: Left ventricular ejection fraction, by visual estimation, is 40 to 45%. The left ventricle has hyperdynamic function. Severe hypokinesis of the left ventricular, entire septal wall. Severe hypokinesis of the left ventricular, entire anteroseptal wall and anterior wall. Severe akinesis of the left ventricular, entire apical segment. The left ventricle demonstrates regional wall motion abnormalities. There is severely increased left ventricular hypertrophy. Concentric left ventricular  hypertrophy. Right Ventricle: The right ventricular size is normal. No increase in right ventricular wall thickness. Global RV systolic function is has normal systolic function. Left Atrium: Left atrial size was not well visualized. Right Atrium: Right atrial size was not well visualized Pericardium: There is no evidence of pericardial effusion. Mitral Valve: The mitral valve is grossly normal. Trivial mitral valve regurgitation. Tricuspid Valve: The tricuspid valve is  grossly normal. Tricuspid valve regurgitation is trivial. Aortic Valve: The aortic valve is tricuspid. Aortic valve regurgitation is not visualized. Pulmonic Valve: The pulmonic valve was not well visualized. Pulmonic valve regurgitation is not visualized. Pulmonic regurgitation is not visualized. Aorta: The ascending aorta was not well visualized. IAS/Shunts: The interatrial septum was not assessed.  LEFT VENTRICLE PLAX 2D LVIDd:         2.50 cm LVIDs:         1.70 cm LV PW:         2.10 cm LV IVS:        2.20 cm LVOT diam:     2.00 cm LV SV:         14 ml LV SV Index:   7.25 LVOT Area:     3.14 cm  LEFT ATRIUM         Index LA diam:    2.40 cm 1.27 cm/m   AORTA Ao Root diam: 3.30 cm  SHUNTS Systemic Diam: 2.00 cm  Lyman Bishop MD Electronically signed by Lyman Bishop MD Signature Date/Time: 06/19/2019/11:49:10 PM    Final     Assessment and Plan:   1. CHB - he has CHB after a large MI. The patient will need either a PPM or ICD, and likely a biv device.  2. Chronic systolic heart failure - He is at risk for worsening. Note CHF team following. Long term he will need a beta blocker and entresto. 3. CAD - he is s/p PCI with no reflow on PCI.  4. Disp. - I discussed the treatment options. He will require pacing and likely a biv if EF is less than 50%.   Jenniger Figiel,M.D.   For questions or updates, please contact [place] Please consult [email or web address] for contact info under     Signed, Cristopher Peru, MD  06/20/2019 9:01 AM

## 2019-06-20 NOTE — Progress Notes (Signed)
Progress Note  Patient Name: Rodney Sullivan Date of Encounter: 06/20/2019  Primary Cardiologist: Peter Martinique, MD   Subjective   He feels well this morning.  After rounding yesterday he developed near syncope, pallor, and shock with systolic blood pressure 60 mmHg.  Monitor demonstrated high-grade/third-degree AV block with heart rate of 40.  Had emergent temporary pacemaker insertion and simultaneous right heart catheterization.  He had low to normal left heart filling pressures.    Inpatient Medications    Scheduled Meds: . allopurinol  100 mg Oral Daily  . aspirin EC  81 mg Oral Q supper  . atorvastatin  80 mg Oral q1800  . brimonidine  1 drop Both Eyes Q12H   And  . timolol  1 drop Both Eyes Q12H  . carvedilol  3.125 mg Oral BID WC  . Chlorhexidine Gluconate Cloth  6 each Topical Daily  . cholecalciferol  2,000 Units Oral Daily  . enoxaparin (LOVENOX) injection  40 mg Subcutaneous Q24H  . ferrous sulfate  325 mg Oral Daily  . insulin aspart  0-5 Units Subcutaneous QHS  . insulin aspart  0-9 Units Subcutaneous TID WC  . latanoprost  1 drop Both Eyes QHS  . losartan  12.5 mg Oral Daily  . mesalamine  2,000 mg Oral BID  . multivitamin with minerals  1 tablet Oral Daily  . omega-3 acid ethyl esters  1 g Oral Daily  . pantoprazole  40 mg Oral Q1500  . pioglitazone  15 mg Oral Daily  . predniSONE  22.5 mg Oral Q breakfast  . sodium chloride flush  3 mL Intravenous Q12H  . ticagrelor  90 mg Oral BID  . vitamin B-12  2,000 mcg Oral Daily   Continuous Infusions: . sodium chloride    . nitroGLYCERIN Stopped (06/19/19 0920)  . norepinephrine (LEVOPHED) Adult infusion Stopped (06/20/19 0209)   PRN Meds: sodium chloride, acetaminophen, albuterol, ALPRAZolam, LORazepam, nitroGLYCERIN, ondansetron (ZOFRAN) IV, perflutren lipid microspheres (DEFINITY) IV suspension, sodium chloride flush, traMADol, zolpidem   Vital Signs    Vitals:   06/20/19 0600 06/20/19 0700 06/20/19  0736 06/20/19 0800  BP: (!) 106/55 94/68  (!) 95/58  Pulse: 80 82  79  Resp: 18 (!) 21  17  Temp:   99.3 F (37.4 C)   TempSrc:   Oral   SpO2: 96% 98%  96%  Weight:        Intake/Output Summary (Last 24 hours) at 06/20/2019 0905 Last data filed at 06/20/2019 0547 Gross per 24 hour  Intake 652.04 ml  Output 625 ml  Net 27.04 ml   Last 3 Weights 06/20/2019 06/19/2019 06/09/2019  Weight (lbs) 168 lb 3.4 oz 169 lb 12.1 oz 165 lb  Weight (kg) 76.3 kg 77 kg 74.844 kg      Telemetry    Pacing at 80 bpm.- Personally Reviewed  ECG    Ventricular pacing at 80 bpm.- Personally Reviewed  Physical Exam  Good skin color GEN: No acute distress.   Neck:  Mild JVD at 2 cm above the clavicle with the patient lying at 45 degrees. Cardiac: RRR, without pericardial friction rub.  Respiratory: Clear to auscultation bilaterally. GI: Soft, nontender, non-distended  MS: No edema; No deformity. Neuro:  Nonfocal  Psych: Normal affect   Labs    High Sensitivity Troponin:   Recent Labs  Lab 06/18/19 1815 06/18/19 1957 06/18/19 2207  TROPONINIHS >27,000* >27,000* >27,000*      Chemistry Recent Labs  Lab 06/18/19 1815 06/18/19  1957 06/19/19 0356 06/20/19 0339  NA 137 140 139 141  K 4.3 4.5 4.1 3.4*  CL 103 103 102 106  CO2 20* 23 23 21*  GLUCOSE 153* 140* 138* 156*  BUN 8 8 11 18   CREATININE 0.84 0.78 1.02 1.10  CALCIUM 8.4* 8.8* 8.7* 8.6*  PROT 5.2* 5.3* 5.0*  --   ALBUMIN 2.8* 3.0* 2.9*  --   AST 236* 490* 332*  --   ALT 54* 78* 71*  --   ALKPHOS 45 50 47  --   BILITOT 0.8 0.7 1.1  --   GFRNONAA >60 >60 >60 >60  GFRAA >60 >60 >60 >60  ANIONGAP 14 14 14 14      Hematology Recent Labs  Lab 06/18/19 1815 06/18/19 1957 06/19/19 0356  WBC 11.4* 12.7* 15.2*  RBC 4.22 4.58 4.27  HGB 11.0* 11.8* 10.9*  HCT 34.9* 38.1* 35.3*  MCV 82.7 83.2 82.7  MCH 26.1 25.8* 25.5*  MCHC 31.5 31.0 30.9  RDW 16.7* 17.0* 17.2*  PLT 262 283 264    BNP Recent Labs  Lab  06/19/19 0356  BNP 268.9*     DDimer No results for input(s): DDIMER in the last 168 hours.   Radiology    CARDIAC CATHETERIZATION  Result Date: 06/19/2019 1.  Successful placement of a transvenous pacing wire from right femoral vein access under fluoroscopic guidance 2.  Right heart catheterization demonstrating hemodynamic findings consistent with low/normal intracardiac filling pressures and low cardiac output (cardiac index 1.99 by Fick and 1.74 by thermodilution) Plan: Continue supportive care.  Volume resuscitation and inotropic therapy with low-dose norepinephrine.  CARDIAC CATHETERIZATION  Result Date: 06/18/2019  Prox LAD to Mid LAD lesion is 100% stenosed.  A drug-eluting stent was successfully placed using a STENT RESOLUTE CWCB7.6E83.  A drug-eluting stent was successfully placed using a STENT RESOLUTE ONYX 3.0X34.  Post intervention, there is a 0% residual stenosis.  1st Diag lesion is 100% stenosed.  Mid Cx to Dist Cx lesion is 50% stenosed.  Prox RCA to Mid RCA lesion is 30% stenosed.  There is severe left ventricular systolic dysfunction.  The left ventricular ejection fraction is 25-35% by visual estimate.  LV end diastolic pressure is mildly elevated.  There is no mitral valve regurgitation.  1. 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 Severe diffuse disease in the vessel. S/p emergent stenting with DES x 2 with No reflow phenomenon. TIMI 2 flow restored. CTO of the first diagonal 2. Severe LV dysfunction with apical dyskinesia and anterolateral severe HK. EF 30-35% 3. Mildly elevated LVEDP Plan; DAPT with ASA and Brilinta for at least one year. Consider indefinite DAPT given PAD and long segment of stents in the LAD Will need beta blocker and ARB therapy +/- aldactone for LV dysfunction. Titrate as tolerated. Will continue IV Ntg overnight. Check Echo.   ECHOCARDIOGRAM COMPLETE  Result Date: 06/19/2019   ECHOCARDIOGRAM REPORT   Patient Name:    Rodney Sullivan Date of Exam: 06/19/2019 Medical Rec #:  [id number]      Height:       67.0 in Accession #:    [id number]     Weight:       169.8 lb Date of Birth:  July 14, 1940      BSA:          1.89 m Patient Age:    78 years       BP:           94/64 mmHg  Patient Gender: M              HR:           95 bpm. Exam Location:  Inpatient Procedure: 2D Echo Indications:    CAD Native Vessel 414.01/I25.10  History:        Patient has no prior history of Echocardiogram examinations.                 Risk Factors:Diabetes, Hypertension and Dyslipidemia.  Sonographer:    Clayton Lefort RDCS (AE) Referring Phys: [phone] Ander Slade Martin[place]  Sonographer Comments: Echo stopped while in progress due to change in patient condition. Nurse notified and stopped at suggestion of nurse. IMPRESSIONS  1. Left ventricular ejection fraction, by visual estimation, is 40 to 45%. The left ventricle has hyperdynamic function. There is severely increased left ventricular hypertrophy. IVSd measures 2.2 cm - consider HCM or infiltrative cardiomyopathy.  2. The left ventricle demonstrates regional wall motion abnormalities.  3. Severe hypokinesis of the left ventricular, entire anteroseptal wall and anterior wall.  4. Severe hypokinesis of the left ventricular, entire septal wall.  5. Global right ventricle has normal systolic function.The right ventricular size is normal. No increase in right ventricular wall thickness.  6. Left atrial size was not well visualized.  7. Right atrial size was not well visualized.  8. The interatrial septum was not assessed.  9. The ascending aorta was not well visualized. 10. The aortic valve is tricuspid. Aortic valve regurgitation is not visualized. 11. The mitral valve is grossly normal. Trivial mitral valve regurgitation. 12. The tricuspid valve is grossly normal. Tricuspid valve regurgitation is trivial. 13. The pulmonic valve was not well visualized. Pulmonic valve regurgitation is not visualized. FINDINGS  Left  Ventricle: Left ventricular ejection fraction, by visual estimation, is 40 to 45%. The left ventricle has hyperdynamic function. Severe hypokinesis of the left ventricular, entire septal wall. Severe hypokinesis of the left ventricular, entire anteroseptal wall and anterior wall. Severe akinesis of the left ventricular, entire apical segment. The left ventricle demonstrates regional wall motion abnormalities. There is severely increased left ventricular hypertrophy. Concentric left ventricular  hypertrophy. Right Ventricle: The right ventricular size is normal. No increase in right ventricular wall thickness. Global RV systolic function is has normal systolic function. Left Atrium: Left atrial size was not well visualized. Right Atrium: Right atrial size was not well visualized Pericardium: There is no evidence of pericardial effusion. Mitral Valve: The mitral valve is grossly normal. Trivial mitral valve regurgitation. Tricuspid Valve: The tricuspid valve is grossly normal. Tricuspid valve regurgitation is trivial. Aortic Valve: The aortic valve is tricuspid. Aortic valve regurgitation is not visualized. Pulmonic Valve: The pulmonic valve was not well visualized. Pulmonic valve regurgitation is not visualized. Pulmonic regurgitation is not visualized. Aorta: The ascending aorta was not well visualized. IAS/Shunts: The interatrial septum was not assessed.  LEFT VENTRICLE PLAX 2D LVIDd:         2.50 cm LVIDs:         1.70 cm LV PW:         2.10 cm LV IVS:        2.20 cm LVOT diam:     2.00 cm LV SV:         14 ml LV SV Index:   7.25 LVOT Area:     3.14 cm  LEFT ATRIUM         Index LA diam:    2.40 cm 1.27 cm/m   AORTA Ao  Root diam: 3.30 cm  SHUNTS Systemic Diam: 2.00 cm  Lyman Bishop MD Electronically signed by Lyman Bishop MD Signature Date/Time: 06/19/2019/11:49:10 PM    Final     Cardiac Studies   TEMPORARY pacemaker and right heart hemodynamic evaluation:  1.  Successful placement of a transvenous  pacing wire from right femoral vein access under fluoroscopic guidance 2.  Right heart catheterization demonstrating hemodynamic findings consistent with low/normal intracardiac filling pressures and low cardiac output (cardiac index 1.99 by Fick and 1.74 by thermodilution)  Plan: Continue supportive care.  Volume resuscitation and inotropic therapy with low-dose norepinephrine.  Patient Profile     78 y.o. male with history of PVD, left BKA with wound VAC, prior smoker, type 2 diabetes mellitus, E. coli bacteremia, and late presenting anteroseptal ST elevation myocardial infarction treated with DES and complicated by no reflow, new right bundle, and third-degree heart block on 06/19/2019   Assessment & Plan    1. Anterior STEMI: Complicated by third-degree heart block with shock.  Resolved with temporary pacemaker and intravascular volume resuscitation.  Capillary wedge pressure was less than 10 when right heart t was performed during insertion of pacemaker.  EF on the partial echocardiogram from yesterday was estimated to be 40%.  Echo with Definity is being performed this morning. 2. Third-degree heart block, new right bundle, still pacemaker dependent: Will likely require permanent pacemaker therapy.  Whether or not defibrillator/resynchronization at the same time will be determined by EP who is following. 3. PAD with left BKA: No specific current complaints 4. Acute systolic heart failure: No clinical evidence of volume overload and was intravascularly depleted when right heart cath done yesterday at the time of temporary pacemaker insertion.  Partial echo yesterday EF was 40%.  Study is being repeated with Definity this a.m. 5. Type 2 diabetes mellitus: Continue to monitor and treat  Continues to have guarded prognosis.  For questions or updates, please contact [place] Please consult [email or web address] for contact info under        Signed, Sinclair Grooms, MD  06/20/2019, 9:05  AM

## 2019-06-20 NOTE — Progress Notes (Signed)
Progress Note  Patient Name: Rodney Sullivan Date of Encounter: 06/19/2019 Primary Cardiologist: Peter Martinique, MD   Subjective   Feels weak.  No chest pain or dyspnea.  Denies headache.  Inpatient Medications    Scheduled Meds: . allopurinol  100 mg Oral Daily  . aspirin EC  81 mg Oral Q supper  . atorvastatin  80 mg Oral q1800  . brimonidine  1 drop Both Eyes Q12H   And  . timolol  1 drop Both Eyes Q12H  . carvedilol  3.125 mg Oral BID WC  . Chlorhexidine Gluconate Cloth  6 each Topical Daily  . cholecalciferol  2,000 Units Oral Daily  . enoxaparin (LOVENOX) injection  40 mg Subcutaneous Q24H  . ferrous sulfate  325 mg Oral Daily  . insulin aspart  0-5 Units Subcutaneous QHS  . insulin aspart  0-9 Units Subcutaneous TID WC  . latanoprost  1 drop Both Eyes QHS  . losartan  12.5 mg Oral Daily  . mesalamine  2,000 mg Oral BID  . multivitamin with minerals  1 tablet Oral Daily  . omega-3 acid ethyl esters  1 g Oral Daily  . pantoprazole  40 mg Oral Q1500  . pioglitazone  15 mg Oral Daily  . predniSONE  22.5 mg Oral Q breakfast  . sodium chloride flush  3 mL Intravenous Q12H  . ticagrelor  90 mg Oral BID  . vitamin B-12  2,000 mcg Oral Daily   Continuous Infusions: . sodium chloride    . nitroGLYCERIN Stopped (06/19/19 0920)  . norepinephrine (LEVOPHED) Adult infusion Stopped (06/20/19 0209)   PRN Meds: sodium chloride, acetaminophen, albuterol, ALPRAZolam, LORazepam, nitroGLYCERIN, ondansetron (ZOFRAN) IV, perflutren lipid microspheres (DEFINITY) IV suspension, sodium chloride flush, traMADol, zolpidem   Vital Signs    Vitals:   06/20/19 0600 06/20/19 0700 06/20/19 0736 06/20/19 0800  BP: (!) 106/55 94/68  (!) 95/58  Pulse: 80 82  79  Resp: 18 (!) 21  17  Temp:   99.3 F (37.4 C)   TempSrc:   Oral   SpO2: 96% 98%  96%  Weight:        Intake/Output Summary (Last 24 hours) at 06/20/2019 0856 Last data filed at 06/20/2019 0547 Gross per 24 hour  Intake  653.55 ml  Output 625 ml  Net 28.55 ml   Last 3 Weights 06/20/2019 06/19/2019 06/09/2019  Weight (lbs) 168 lb 3.4 oz 169 lb 12.1 oz 165 lb  Weight (kg) 76.3 kg 77 kg 74.844 kg      Telemetry    Normal sinus rhythm- Personally Reviewed  ECG    Normal sinus rhythm with new right bundle branch block and QS pattern V1 through V4.- Personally Reviewed  Physical Exam  Good skin color, lying comfortably. GEN: No acute distress.   Neck: No JVD Cardiac:  No rub or gallop Respiratory: Clear to auscultation bilaterally. GI: Soft, nontender, non-distended  MS:  Left BKA Neuro:  Nonfocal  Psych: Normal affect   Labs    High Sensitivity Troponin:   Recent Labs  Lab 06/18/19 1815 06/18/19 1957 06/18/19 2207  TROPONINIHS >27,000* >27,000* >27,000*      Chemistry Recent Labs  Lab 06/18/19 1815 06/18/19 1957 06/19/19 0356 06/20/19 0339  NA 137 140 139 141  K 4.3 4.5 4.1 3.4*  CL 103 103 102 106  CO2 20* 23 23 21*  GLUCOSE 153* 140* 138* 156*  BUN 8 8 11 18   CREATININE 0.84 0.78 1.02 1.10  CALCIUM 8.4* 8.8*  8.7* 8.6*  PROT 5.2* 5.3* 5.0*  --   ALBUMIN 2.8* 3.0* 2.9*  --   AST 236* 490* 332*  --   ALT 54* 78* 71*  --   ALKPHOS 45 50 47  --   BILITOT 0.8 0.7 1.1  --   GFRNONAA >60 >60 >60 >60  GFRAA >60 >60 >60 >60  ANIONGAP 14 14 14 14      Hematology Recent Labs  Lab 06/18/19 1815 06/18/19 1957 06/19/19 0356  WBC 11.4* 12.7* 15.2*  RBC 4.22 4.58 4.27  HGB 11.0* 11.8* 10.9*  HCT 34.9* 38.1* 35.3*  MCV 82.7 83.2 82.7  MCH 26.1 25.8* 25.5*  MCHC 31.5 31.0 30.9  RDW 16.7* 17.0* 17.2*  PLT 262 283 264    BNP Recent Labs  Lab 06/19/19 0356  BNP 268.9*     DDimer No results for input(s): DDIMER in the last 168 hours.   Radiology    CARDIAC CATHETERIZATION  Result Date: 06/19/2019 1.  Successful placement of a transvenous pacing wire from right femoral vein access under fluoroscopic guidance 2.  Right heart catheterization demonstrating hemodynamic  findings consistent with low/normal intracardiac filling pressures and low cardiac output (cardiac index 1.99 by Fick and 1.74 by thermodilution) Plan: Continue supportive care.  Volume resuscitation and inotropic therapy with low-dose norepinephrine.  CARDIAC CATHETERIZATION  Result Date: 06/18/2019  Prox LAD to Mid LAD lesion is 100% stenosed.  A drug-eluting stent was successfully placed using a STENT RESOLUTE ALPF7.9K24.  A drug-eluting stent was successfully placed using a STENT RESOLUTE ONYX 3.0X34.  Post intervention, there is a 0% residual stenosis.  1st Diag lesion is 100% stenosed.  Mid Cx to Dist Cx lesion is 50% stenosed.  Prox RCA to Mid RCA lesion is 30% stenosed.  There is severe left ventricular systolic dysfunction.  The left ventricular ejection fraction is 25-35% by visual estimate.  LV end diastolic pressure is mildly elevated.  There is no mitral valve regurgitation.  1. 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 Severe diffuse disease in the vessel. S/p emergent stenting with DES x 2 with No reflow phenomenon. TIMI 2 flow restored. CTO of the first diagonal 2. Severe LV dysfunction with apical dyskinesia and anterolateral severe HK. EF 30-35% 3. Mildly elevated LVEDP Plan; DAPT with ASA and Brilinta for at least one year. Consider indefinite DAPT given PAD and long segment of stents in the LAD Will need beta blocker and ARB therapy +/- aldactone for LV dysfunction. Titrate as tolerated. Will continue IV Ntg overnight. Check Echo.   ECHOCARDIOGRAM COMPLETE  Result Date: 06/19/2019   ECHOCARDIOGRAM REPORT   Patient Name:   Rodney Sullivan Date of Exam: 06/19/2019 Medical Rec #:  [id number]      Height:       67.0 in Accession #:    [id number]     Weight:       169.8 lb Date of Birth:  12/04/1940      BSA:          1.89 m Patient Age:    6 years       BP:           94/64 mmHg Patient Gender: M              HR:           95 bpm. Exam Location:  Inpatient  Procedure: 2D Echo Indications:    CAD Native Vessel 414.01/I25.10  History:  Patient has no prior history of Echocardiogram examinations.                 Risk Factors:Diabetes, Hypertension and Dyslipidemia.  Sonographer:    Clayton Lefort RDCS (AE) Referring Phys: [phone] Ander Slade Martin[place]  Sonographer Comments: Echo stopped while in progress due to change in patient condition. Nurse notified and stopped at suggestion of nurse. IMPRESSIONS  1. Left ventricular ejection fraction, by visual estimation, is 40 to 45%. The left ventricle has hyperdynamic function. There is severely increased left ventricular hypertrophy. IVSd measures 2.2 cm - consider HCM or infiltrative cardiomyopathy.  2. The left ventricle demonstrates regional wall motion abnormalities.  3. Severe hypokinesis of the left ventricular, entire anteroseptal wall and anterior wall.  4. Severe hypokinesis of the left ventricular, entire septal wall.  5. Global right ventricle has normal systolic function.The right ventricular size is normal. No increase in right ventricular wall thickness.  6. Left atrial size was not well visualized.  7. Right atrial size was not well visualized.  8. The interatrial septum was not assessed.  9. The ascending aorta was not well visualized. 10. The aortic valve is tricuspid. Aortic valve regurgitation is not visualized. 11. The mitral valve is grossly normal. Trivial mitral valve regurgitation. 12. The tricuspid valve is grossly normal. Tricuspid valve regurgitation is trivial. 13. The pulmonic valve was not well visualized. Pulmonic valve regurgitation is not visualized. FINDINGS  Left Ventricle: Left ventricular ejection fraction, by visual estimation, is 40 to 45%. The left ventricle has hyperdynamic function. Severe hypokinesis of the left ventricular, entire septal wall. Severe hypokinesis of the left ventricular, entire anteroseptal wall and anterior wall. Severe akinesis of the left ventricular, entire apical segment.  The left ventricle demonstrates regional wall motion abnormalities. There is severely increased left ventricular hypertrophy. Concentric left ventricular  hypertrophy. Right Ventricle: The right ventricular size is normal. No increase in right ventricular wall thickness. Global RV systolic function is has normal systolic function. Left Atrium: Left atrial size was not well visualized. Right Atrium: Right atrial size was not well visualized Pericardium: There is no evidence of pericardial effusion. Mitral Valve: The mitral valve is grossly normal. Trivial mitral valve regurgitation. Tricuspid Valve: The tricuspid valve is grossly normal. Tricuspid valve regurgitation is trivial. Aortic Valve: The aortic valve is tricuspid. Aortic valve regurgitation is not visualized. Pulmonic Valve: The pulmonic valve was not well visualized. Pulmonic valve regurgitation is not visualized. Pulmonic regurgitation is not visualized. Aorta: The ascending aorta was not well visualized. IAS/Shunts: The interatrial septum was not assessed.  LEFT VENTRICLE PLAX 2D LVIDd:         2.50 cm LVIDs:         1.70 cm LV PW:         2.10 cm LV IVS:        2.20 cm LVOT diam:     2.00 cm LV SV:         14 ml LV SV Index:   7.25 LVOT Area:     3.14 cm  LEFT ATRIUM         Index LA diam:    2.40 cm 1.27 cm/m   AORTA Ao Root diam: 3.30 cm  SHUNTS Systemic Diam: 2.00 cm  Lyman Bishop MD Electronically signed by Lyman Bishop MD Signature Date/Time: 06/19/2019/11:49:10 PM    Final     Cardiac Studies   Cardiac cath with PCI on admission:   Patient Profile     78 y.o. male  with history of PVD, left BKA with wound VAC, prior smoker, type 2 diabetes mellitus, E. coli bacteremia, and late presenting anteroseptal ST elevation myocardial infarction treated with DES and complicated by no reflow.  Assessment & Plan    1. Anterior ST elevation myocardial infarction: Complicated by no reflow due to late presentation.  No clinical evidence of CHF or  arrhythmia so far.  Plan high intensity statin therapy, long-acting nitrates to help clear microcirculatory obstruction. 2. Type 2 diabetes mellitus, poorly controlled: Will check hemoglobin A1c and manage sugars with insulin as needed. 3. PAD with left BKA: Monitor for evidence of recurrent infection 4. Acute systolic heart failure: LVEF 30% with elevated EDP at time of cath.  Guideline directed therapy will include ARB and beta-blocker therapy as tolerated.  SGLT2 therapy may be helpful.  2D Doppler echo is pending. 5. Persistent ST elevation in precordial leads with new right bundle branch block.  Guarded prognosis given PAD amputation and now with MI with acute systolic heart failure and conduction abnormality as noted.  For questions or updates, please contact [place] Please consult [email or web address] for contact info under        Signed, Sinclair Grooms, MD  06/20/2019, 8:56 AM

## 2019-06-20 NOTE — Progress Notes (Addendum)
Patient's Wife requested that wound care comes to replace wound vac dressing on patient's L stump BKA tomorrow after 10 am so the wife can be present during dressing change.   Luberta Mutter RN

## 2019-06-20 NOTE — Progress Notes (Signed)
  Echocardiogram 2D Echocardiogram has been performed.  Rodney Sullivan 06/20/2019, 3:21 PM

## 2019-06-20 NOTE — Plan of Care (Signed)
  Problem: Clinical Measurements: Goal: Ability to maintain clinical measurements within normal limits will improve Outcome: Progressing Goal: Cardiovascular complication will be avoided Outcome: Progressing   Problem: Elimination: Goal: Will not experience complications related to bowel motility Outcome: Progressing   Problem: Pain Managment: Goal: General experience of comfort will improve Outcome: Progressing   Problem: Safety: Goal: Ability to remain free from injury will improve Outcome: Progressing    Pt was alert and oriented throughout the shift, but also restless at times. Pt had to be frequently reminded to keep right leg straight and reeducated on transvenous pacer. Pt grew very frustrated at times at pt care equipment, he was reassured, repositioned, and reeducated as needed. Pt's heart rate has been paced and dependant on pacer, blood pressure has stabilized and pt is no off pressors. Pt report shortness of breath when anxious, PRN medication was given ( see MAR). Pt is able to move all extremities. Pt was bathed, peri care was provided when pt had incontinent episodes, and bed pad was changed a few times throughout the night. Pt continues to deny pain and has remained safe in the bed with reassurance. Pt reports being comfortable at this time, no complaints or concerns voiced. Call bell is within reach, bed alarm is on, and bed is in lowest position.

## 2019-06-20 NOTE — Progress Notes (Signed)
Orthopedic Tech Progress Note Patient Details:  Rodney Sullivan 11-09-1940 [id number]  Patient ID: [name], male   DOB: Aug 11, 1940, 78 y.o.   MRN: [id number] Applied at rns request  Karolee Stamps 06/20/2019, 8:20 PM

## 2019-06-21 ENCOUNTER — Encounter (HOSPITAL_COMMUNITY)
Admission: EM | Disposition: A | Discharge status: Home Health | Payer: Self-pay | Source: Home / Self Care | Attending: Cardiology

## 2019-06-21 ENCOUNTER — Inpatient Hospital Stay (HOSPITAL_COMMUNITY): Payer: Medicare HMO

## 2019-06-21 ENCOUNTER — Encounter: Payer: Self-pay | Admitting: Cardiology

## 2019-06-21 DIAGNOSIS — I213 ST elevation (STEMI) myocardial infarction of unspecified site: Secondary | ICD-10-CM

## 2019-06-21 DIAGNOSIS — Q210 Ventricular septal defect: Secondary | ICD-10-CM

## 2019-06-21 DIAGNOSIS — I232 Ventricular septal defect as current complication following acute myocardial infarction: Secondary | ICD-10-CM

## 2019-06-21 DIAGNOSIS — I313 Pericardial effusion (noninflammatory): Secondary | ICD-10-CM

## 2019-06-21 HISTORY — PX: TEE WITHOUT CARDIOVERSION: SHX5443

## 2019-06-21 LAB — POCT I-STAT EG7
Acid-base deficit: 4 mmol/L — ABNORMAL HIGH (ref 0.0–2.0)
Acid-base deficit: 4 mmol/L — ABNORMAL HIGH (ref 0.0–2.0)
Bicarbonate: 20.1 mmol/L (ref 20.0–28.0)
Bicarbonate: 20.3 mmol/L (ref 20.0–28.0)
Calcium, Ion: 1.13 mmol/L — ABNORMAL LOW (ref 1.15–1.40)
Calcium, Ion: 1.16 mmol/L (ref 1.15–1.40)
HCT: 30 % — ABNORMAL LOW (ref 39.0–52.0)
HCT: 32 % — ABNORMAL LOW (ref 39.0–52.0)
Hemoglobin: 10.2 g/dL — ABNORMAL LOW (ref 13.0–17.0)
Hemoglobin: 10.9 g/dL — ABNORMAL LOW (ref 13.0–17.0)
O2 Saturation: 49 %
O2 Saturation: 52 %
Potassium: 3.6 mmol/L (ref 3.5–5.1)
Potassium: 3.6 mmol/L (ref 3.5–5.1)
Sodium: 137 mmol/L (ref 135–145)
Sodium: 138 mmol/L (ref 135–145)
TCO2: 21 mmol/L — ABNORMAL LOW (ref 22–32)
TCO2: 21 mmol/L — ABNORMAL LOW (ref 22–32)
pCO2, Ven: 32 mmHg — ABNORMAL LOW (ref 44.0–60.0)
pCO2, Ven: 32 mmHg — ABNORMAL LOW (ref 44.0–60.0)
pH, Ven: 7.406 (ref 7.250–7.430)
pH, Ven: 7.41 (ref 7.250–7.430)
pO2, Ven: 26 mmHg — CL (ref 32.0–45.0)
pO2, Ven: 27 mmHg — CL (ref 32.0–45.0)

## 2019-06-21 LAB — ECHOCARDIOGRAM LIMITED
Weight: 2691.38 oz
Weight: 2751.34 oz

## 2019-06-21 LAB — COOXEMETRY PANEL
Carboxyhemoglobin: 0.7 % (ref 0.5–1.5)
Methemoglobin: 3.3 % — ABNORMAL HIGH (ref 0.0–1.5)
O2 Saturation: 66.7 %
Total hemoglobin: 9.9 g/dL — ABNORMAL LOW (ref 12.0–16.0)

## 2019-06-21 LAB — CBC
HCT: 30.9 % — ABNORMAL LOW (ref 39.0–52.0)
Hemoglobin: 9.6 g/dL — ABNORMAL LOW (ref 13.0–17.0)
MCH: 26.5 pg (ref 26.0–34.0)
MCHC: 31.1 g/dL (ref 30.0–36.0)
MCV: 85.4 fL (ref 80.0–100.0)
Platelets: 178 10*3/uL (ref 150–400)
RBC: 3.62 MIL/uL — ABNORMAL LOW (ref 4.22–5.81)
RDW: 17.4 % — ABNORMAL HIGH (ref 11.5–15.5)
WBC: 14 10*3/uL — ABNORMAL HIGH (ref 4.0–10.5)
nRBC: 0.2 % (ref 0.0–0.2)

## 2019-06-21 LAB — BASIC METABOLIC PANEL
Anion gap: 12 (ref 5–15)
BUN: 15 mg/dL (ref 8–23)
CO2: 21 mmol/L — ABNORMAL LOW (ref 22–32)
Calcium: 8.4 mg/dL — ABNORMAL LOW (ref 8.9–10.3)
Chloride: 109 mmol/L (ref 98–111)
Creatinine, Ser: 0.91 mg/dL (ref 0.61–1.24)
GFR calc Af Amer: >60 mL/min (ref >60)
GFR calc non Af Amer: >60 mL/min (ref >60)
Glucose, Bld: 131 mg/dL — ABNORMAL HIGH (ref 70–99)
Potassium: 3.5 mmol/L (ref 3.5–5.1)
Sodium: 142 mmol/L (ref 135–145)

## 2019-06-21 LAB — GLUCOSE, CAPILLARY
Glucose-Capillary: 123 mg/dL — ABNORMAL HIGH (ref 70–99)
Glucose-Capillary: 192 mg/dL — ABNORMAL HIGH (ref 70–99)
Glucose-Capillary: 193 mg/dL — ABNORMAL HIGH (ref 70–99)
Glucose-Capillary: 171 mg/dL — ABNORMAL HIGH (ref 70–99)

## 2019-06-21 SURGERY — PACEMAKER IMPLANT
Anesthesia: LOCAL

## 2019-06-21 SURGERY — ECHOCARDIOGRAM, TRANSESOPHAGEAL
Anesthesia: Moderate Sedation

## 2019-06-21 MED ORDER — MIDAZOLAM HCL (PF) 5 MG/ML IJ SOLN
INTRAMUSCULAR | Status: AC
  Filled 2019-06-21: qty 2

## 2019-06-21 MED ORDER — MIDAZOLAM HCL (PF) 10 MG/2ML IJ SOLN
INTRAMUSCULAR | Status: DC | PRN
  Administered 2019-06-21: 2 mg via INTRAVENOUS

## 2019-06-21 MED ORDER — FENTANYL CITRATE (PF) 100 MCG/2ML IJ SOLN
INTRAMUSCULAR | Status: AC
  Filled 2019-06-21: qty 2

## 2019-06-21 MED ORDER — LIDOCAINE VISCOUS HCL 2 % MT SOLN
OROMUCOSAL | Status: DC | PRN
  Administered 2019-06-21: 20 mL via OROMUCOSAL

## 2019-06-21 MED ORDER — DOBUTAMINE IN D5W 4-5 MG/ML-% IV SOLN: 1.5000 ug/kg/min | INTRAVENOUS | Status: DC

## 2019-06-21 MED ORDER — SPIRONOLACTONE 12.5 MG HALF TABLET
12.5000 mg | ORAL_TABLET | Freq: Every day | ORAL | Status: DC
  Administered 2019-06-21 – 2019-06-25 (×5): 12.5 mg via ORAL
  Filled 2019-06-21 (×5): qty 1

## 2019-06-21 MED ORDER — POTASSIUM CHLORIDE CRYS ER 20 MEQ PO TBCR
40.0000 meq | EXTENDED_RELEASE_TABLET | Freq: Once | ORAL | Status: AC
  Administered 2019-06-21: 40 meq via ORAL
  Filled 2019-06-21: qty 2

## 2019-06-21 MED ORDER — SODIUM CHLORIDE 0.9 % IV SOLN: INTRAVENOUS | Status: DC

## 2019-06-21 MED ORDER — FENTANYL CITRATE (PF) 100 MCG/2ML IJ SOLN
INTRAMUSCULAR | Status: DC | PRN
  Administered 2019-06-21: 50 ug via INTRAVENOUS

## 2019-06-21 MED ORDER — FUROSEMIDE 10 MG/ML IJ SOLN
40.0000 mg | Freq: Two times a day (BID) | INTRAMUSCULAR | Status: DC
  Administered 2019-06-21: 40 mg via INTRAVENOUS
  Filled 2019-06-21: qty 4

## 2019-06-21 MED FILL — Perflutren Lipid Microsphere IV Susp 1.1 MG/ML: INTRAVENOUS | Qty: 10 | Status: AC

## 2019-06-21 MED FILL — Adenosine IV Soln 12 MG/4ML: INTRAVENOUS | Qty: 4 | Status: AC

## 2019-06-21 MED FILL — Verapamil HCl IV Soln 2.5 MG/ML: INTRAVENOUS | Qty: 2 | Status: AC

## 2019-06-21 MED FILL — Heparin Sod (Porcine)-NaCl IV Soln 1000 Unit/500ML-0.9%: INTRAVENOUS | Qty: 500 | Status: AC

## 2019-06-21 NOTE — H&P (View-Only) (Signed)
Advanced Heart Failure Rounding Note  PCP-Cardiologist: Peter Martinique, MD  AHF: Dr. Aundra Dubin   Subjective:    Admitted for acute anterior MI c/b cardiogenic shock and CHB. S/p DES to LAD. EF ~35%. Has temp pacer wire for CHB, but is is now conducting normally.   No events overnight. CP free. No dyspnea.   Co-ox 67% on Dobutamine 2.5,  MAPs in the 70s. CVP 11-12. Sinus tach 106 on tele. No VT.  Possible infarct-related VSD on imaging from 12/13.  RHC from 12/12 showed PA sat 51% which is not suggestive of VSD, but could have occurred between [place] and echo.  Also has small to moderate pleural effusion with is concerning.    Objective:   Weight Range: 78 kg Body mass index is 26.93 kg/m.   Vital Signs:   Temp:  [98.1 F (36.7 C)-99.3 F (37.4 C)] 99.2 F (37.3 C) (12/14 0700) Pulse Rate:  [79-110] 104 (12/14 0600) Resp:  [12-24] 24 (12/14 0600) BP: (85-130)/(51-92) 96/60 (12/14 0600) SpO2:  [91 %-99 %] 94 % (12/14 0600) Weight:  [78 kg] 78 kg (12/14 0300) Last BM Date: 06/20/19  Weight change: Filed Weights   06/19/19 0500 06/20/19 0340 06/21/19 0300  Weight: 77 kg 76.3 kg 78 kg    Intake/Output:   Intake/Output Summary (Last 24 hours) at 06/21/2019 0712 Last data filed at 06/21/2019 0500 Gross per 24 hour  Intake 979.92 ml  Output 150 ml  Net 829.92 ml      Physical Exam    CVP 11 General:  Elderly WM Well appearing. No resp difficulty HEENT: Normal Neck: Supple. JVP 10 cm. Carotids 2+ bilat; no bruits. No lymphadenopathy or thyromegaly appreciated. Cor: PMI nondisplaced. Regular rate & rhythm. No rubs, gallops or murmurs. Lungs: Clear Abdomen: Soft, nontender, nondistended. No hepatosplenomegaly. No bruits or masses. Good bowel sounds. Extremities: s/p left BKA No cyanosis, clubbing, rash, edema Neuro: Alert & orientedx3, cranial nerves grossly intact. moves all 4 extremities w/o difficulty. Affect pleasant   Telemetry   Sinus tach 106 bpm   EKG    No new EKG to review today  Labs    CBC Recent Labs    06/18/19 1957 06/19/19 0356 06/21/19 0538  WBC 12.7* 15.2* 14.0*  NEUTROABS 8.4*  --   --   HGB 11.8* 10.9* 9.6*  HCT 38.1* 35.3* 30.9*  MCV 83.2 82.7 85.4  PLT 283 264 937   Basic Metabolic Panel Recent Labs    06/20/19 0339 06/21/19 0538  NA 141 142  K 3.4* 3.5  CL 106 109  CO2 21* 21*  GLUCOSE 156* 131*  BUN 18 15  CREATININE 1.10 0.91  CALCIUM 8.6* 8.4*   Liver Function Tests Recent Labs    06/18/19 1957 06/19/19 0356  AST 490* 332*  ALT 78* 71*  ALKPHOS 50 47  BILITOT 0.7 1.1  PROT 5.3* 5.0*  ALBUMIN 3.0* 2.9*   No results for input(s): LIPASE, AMYLASE in the last 72 hours. Cardiac Enzymes No results for input(s): CKTOTAL, CKMB, CKMBINDEX, TROPONINI in the last 72 hours.  BNP: BNP (last 3 results) Recent Labs    06/19/19 0356  BNP 268.9*    ProBNP (last 3 results) No results for input(s): PROBNP in the last 8760 hours.   D-Dimer No results for input(s): DDIMER in the last 72 hours. Hemoglobin A1C Recent Labs    06/20/19 0339  HGBA1C 7.3*   Fasting Lipid Panel Recent Labs    06/19/19 0356  CHOL  171  HDL 38*  LDLCALC UNABLE TO CALCULATE IF TRIGLYCERIDE OVER 400 mg/dL  TRIG 676*  CHOLHDL 4.5  LDLDIRECT 59.7   Thyroid Function Tests Recent Labs    06/18/19 1957  TSH 1.282    Other results:   Imaging    ECHOCARDIOGRAM COMPLETE  Result Date: 06/20/2019   ECHOCARDIOGRAM REPORT   Patient Name:   Rodney Sullivan Date of Exam: 06/20/2019 Medical Rec #:  [id number]      Height:       67.0 in Accession #:    [id number]     Weight:       168.2 lb Date of Birth:  08/08/1940      BSA:          1.88 m Patient Age:    78 years       BP:           94/68 mmHg Patient Gender: M              HR:           82 bpm. Exam Location:  Inpatient Procedure: 2D Echo and Intracardiac Opacification Agent Indications:     414.01/I25.10  History:         Patient has prior history of Echocardiogram  examinations, most                  recent 06/19/2019. Risk Factors:Hypertension, Diabetes and                  Dyslipidemia.  Sonographer:     Clayton Lefort RDCS (AE) Referring Phys:  1993 RHONDA G BARRETT Diagnosing Phys: Carlyle Dolly MD  Sonographer Comments: Technically challenging study due to limited acoustic windows, Technically difficult study due to poor echo windows, suboptimal parasternal window and suboptimal apical window. IMPRESSIONS  1. Left ventricular ejection fraction, by visual estimation, is 40%. The left ventricle has normal function. There is severely increased left ventricular hypertrophy.  2. Definity contrast agent was given IV to delineate the left ventricular endocardial borders.  3. Left ventricular diastolic parameters are indeterminate.  4. LV poorly visualized even with contrast. The apex, anterior, and anteroseptal walls appear to be hypokinetic.  5. Global right ventricle has normal systolic function.The right ventricular size is not well visualized. No increase in right ventricular wall thickness.  6. Left atrial size was normal.  7. Right atrial size was normal.  8. Small pericardial effusion, circumferential and partially loculated. No signs of tamponade physiology.  9. The pericardial effusion is circumferential. 10. The mitral valve is normal in structure. No evidence of mitral valve regurgitation. No evidence of mitral stenosis. 11. The tricuspid valve is normal in structure. Tricuspid valve regurgitation is not demonstrated. 12. The aortic valve has an indeterminant number of cusps. Aortic valve regurgitation is not visualized. No evidence of aortic valve sclerosis or stenosis. 13. Pulmonic regurgitation is mild. 14. The pulmonic valve was not well visualized. Pulmonic valve regurgitation is mild. 15. The inferior vena cava is normal in size with greater than 50% respiratory variability, suggesting right atrial pressure of 3 mmHg. 16. The interatrial septum was not well  visualized. FINDINGS  Left Ventricle: Left ventricular ejection fraction, by visual estimation, is 40%. The left ventricle has normal function. Definity contrast agent was given IV to delineate the left ventricular endocardial borders. The left ventricle is not well visualized. There is severely increased left ventricular hypertrophy. Left ventricular diastolic parameters are indeterminate. LV poorly visualized even with contrast. The  apex, anterior, and anteroseptal walls appear to be hypokinetic. Right Ventricle: The right ventricular size is not well visualized. No increase in right ventricular wall thickness. Global RV systolic function is has normal systolic function. Left Atrium: Left atrial size was normal in size. Right Atrium: Right atrial size was normal in size Pericardium: A small pericardial effusion is present. The pericardial effusion is circumferential. The pericardial effusion appears to contain partially loculated effusion. Mitral Valve: The mitral valve is normal in structure. There is mild thickening of the mitral valve leaflet(s). No evidence of mitral valve regurgitation. No evidence of mitral valve stenosis by observation. Tricuspid Valve: The tricuspid valve is normal in structure. Tricuspid valve regurgitation is not demonstrated. Aortic Valve: The aortic valve has an indeterminant number of cusps. Aortic valve regurgitation is not visualized. The aortic valve is structurally normal, with no evidence of sclerosis or stenosis. Mild aortic valve annular calcification. There is mild calcification of the aortic valve. Aortic valve mean gradient measures 4.9 mmHg. Aortic valve peak gradient measures 9.1 mmHg. Aortic valve area, by VTI measures 3.00 cm. Pulmonic Valve: The pulmonic valve was not well visualized. Pulmonic valve regurgitation is mild. Pulmonic regurgitation is mild. No evidence of pulmonic stenosis. Aorta: The aortic root is normal in size and structure. Pulmonary Artery:  Indeterminate PASP, inadequate TR jet. Venous: The inferior vena cava is normal in size with greater than 50% respiratory variability, suggesting right atrial pressure of 3 mmHg. IAS/Shunts: The interatrial septum was not well visualized.  LEFT VENTRICLE PLAX 2D LVIDd:         3.30 cm LVIDs:         2.40 cm LV PW:         2.20 cm LV IVS:        2.10 cm LVOT diam:     2.00 cm LV SV:         24 ml LV SV Index:   12.53 LVOT Area:     3.14 cm  RIGHT VENTRICLE            IVC RV Basal diam:  2.10 cm    IVC diam: 1.80 cm RV S prime:     9.79 cm/s TAPSE (M-mode): 2.2 cm LEFT ATRIUM           Index       RIGHT ATRIUM           Index LA diam:      2.30 cm 1.22 cm/m  RA Area:     10.50 cm LA Vol (A2C): 16.7 ml 8.89 ml/m  RA Volume:   20.70 ml  11.02 ml/m LA Vol (A4C): 26.2 ml 13.94 ml/m  AORTIC VALVE AV Area (Vmax):    2.80 cm AV Area (Vmean):   2.81 cm AV Area (VTI):     3.00 cm AV Vmax:           150.64 cm/s AV Vmean:          103.079 cm/s AV VTI:            0.245 m AV Peak Grad:      9.1 mmHg AV Mean Grad:      4.9 mmHg LVOT Vmax:         134.20 cm/s LVOT Vmean:        92.140 cm/s LVOT VTI:          0.233 m LVOT/AV VTI ratio: 0.95  AORTA Ao Root diam: 3.50 cm Ao Asc diam:  3.00 cm  SHUNTS Systemic VTI:  0.23 m Systemic Diam: 2.00 cm  Carlyle Dolly MD Electronically signed by Carlyle Dolly MD Signature Date/Time: 06/20/2019/10:13:41 AM    Final (Updated)    ECHOCARDIOGRAM LIMITED  Result Date: 06/20/2019   ECHOCARDIOGRAM LIMITED REPORT   Patient Name:   Rodney Sullivan Date of Exam: 06/20/2019 Medical Rec #:  [id number]      Height:       67.0 in Accession #:    [id number]     Weight:       168.2 lb Date of Birth:  10/24/1940      BSA:          1.88 m Patient Age:    28 years       BP:           85/63 mmHg Patient Gender: M              HR:           89 bpm. Exam Location:  Inpatient  Procedure: Limited Echo Indications:    CHF-Acute Systolic 025.85 / I77.82  History:        Patient has prior history of  Echocardiogram examinations, most                 recent 06/21/2019.  Sonographer:    Clayton Lefort RDCS (AE) Referring Phys: [place]  1. Limited study to evaluate possibly VSD. Difficult visualization. Prior studies from 06/19/19 and from 06/20/19 reviewed in context of current study. There was originally some question about a possible basal inferoseptal VSD. From review of all available images the patient has severe septal hypertrophy with a LVOT with a very sharp nearly 90 degree angle giving the appearance of a potential communication between the ventricles, this is not appear to be a VSD. Clinically a basal septal VSD also would not correlate with his LAD infarct and he has no significant mumrur on exam.Technically difficult bubble study.  2. Mild to moderate pericardial effusion.  3. The tricuspid valve is not assessed. Tricuspid valve regurgitation not assessed.  4. The pulmonic valve was not assessed. Pulmonic valve regurgitation not assessed.  5. Limited study to evaluate possible VSD  6. Mild to moderate partially loculated pericardial effusion most prominent adjacent to the RV, no signs of tamponade physiology.  7. Results discussed with primary team  8. Aortic root could not be assessed.  9. The aortic valve was not assessed. Aortic valve regurgitation not assessed. 10. Left ventricular ejection fraction, by visual estimation, is not assessed%. The left ventricle has not assessed function. There is not assessed. 11. The mitral valve was not assessed. not assessed mitral valve regurgitation. 12. Global right ventricle was not assessed.The right ventricular size is not assessed. not assessed. FINDINGS  Left Ventricle: Left ventricular ejection fraction, by visual estimation, is not assessed%. Limited study to evaluate possible VSD. Difficult visualization. Prior studies from 06/19/19 and from 06/20/19 reviewed in context of current study. There was originally some question about a  possible basal inferoseptal VSD. From review of all available images the patient has severe septal hypertrophy with a LVOT with a very sharp take off at a nearly 90 degree angle (best seen in the 06/19/19 study) giving the false appearance of a potential communication between the ventricles in certain views, this does not appear to be a tue VSD. Clinically a basal septal VSD also would not correlate with his LAD infarct and he has no significant murmur on exam.  Technically difficult bubble study. Pericardium: A moderately sized pericardial effusion is present. Mild to moderate partially loculated pericardial effusion most prominent adjacent to the RV, no signs of tamponade physiology. Tricuspid Valve: The tricuspid valve is not assessed. Tricuspid valve regurgitation not assessed. Aortic Valve: The aortic valve was not assessed. Pulmonic Valve: The pulmonic valve was not assessed. Pulmonic valve regurgitation not assessed. Aorta: Aortic root could not be assessed.  Carlyle Dolly MD Electronically signed by Carlyle Dolly MD Signature Date/Time: 06/20/2019/4:09:55 PM    Final       Medications:     Scheduled Medications:  allopurinol  100 mg Oral Daily   aspirin EC  81 mg Oral Q supper   atorvastatin  80 mg Oral q1800   brimonidine  1 drop Both Eyes Q12H   And   timolol  1 drop Both Eyes Q12H   Chlorhexidine Gluconate Cloth  6 each Topical Daily   cholecalciferol  2,000 Units Oral Daily   enoxaparin (LOVENOX) injection  40 mg Subcutaneous Q24H   ferrous sulfate  325 mg Oral Daily   insulin aspart  0-5 Units Subcutaneous QHS   insulin aspart  0-9 Units Subcutaneous TID WC   latanoprost  1 drop Both Eyes QHS   losartan  12.5 mg Oral Daily   mesalamine  2,000 mg Oral BID   multivitamin with minerals  1 tablet Oral Daily   omega-3 acid ethyl esters  1 g Oral Daily   pantoprazole  40 mg Oral Q1500   predniSONE  22.5 mg Oral Q breakfast   sodium chloride flush  3 mL  Intravenous Q12H   ticagrelor  90 mg Oral BID   vitamin B-12  2,000 mcg Oral Daily     Infusions:  sodium chloride 10 mL/hr at 06/20/19 0700   sodium chloride     DOBUTamine 2.5 mcg/kg/min (06/21/19 0500)   nitroGLYCERIN Stopped (06/19/19 0920)   norepinephrine (LEVOPHED) Adult infusion Stopped (06/20/19 0209)     PRN Medications:  sodium chloride, sodium chloride, acetaminophen, albuterol, ALPRAZolam, LORazepam, nitroGLYCERIN, ondansetron (ZOFRAN) IV, sodium chloride flush, traMADol, zolpidem    Patient Profile   78 y.o male with history of extensive PAD, type 2 diabetes, prior PE/DVT, and Crohns disease was admitted with anterior MI. Emergent cath on 12/11 showed acutely occluded mLAD and CTO D1.  LAD was opened with 2 DES but there was no-reflow.  On 12/12, he developed complete heart block and temporary transvenous pacer was placed.  RHC showed low output (CI 1.99 Fick) but PCWP 11. Initial co-ox 52% and started on dobutamine. Echo w/ apical and peri-apical severe akinesis, EF ~ 35%, normal RV function, small pericardial effusion, contrast images w/ ? MI related VSD although not confirmed on the non-contrast images.   Assessment/Plan   1. CAD: Acute anteroseptal MI, somewhat delayed presentation.  Cath with acutely occluded mLAD and CTO D1.  DES x 2 to LAD, but no-reflow. No s/s angina.  - Continue ASA 81 and ticagrelor 90 bid.  - Atorvastatin 80 mg daily.  - no  blocker w/ CHB and low output 2. Acute systolic CHF: Ischemic cardiomyopathy.  EF ~ 35% with apical/peri-apical severe hypokinesis to akinesis.  Initial concerns for possible post-MI VSD however this was ruled out on repeat limited echo. RHC showed low output (CI 1.99 Fick). Started on dobutamine 12/13. Initial co-ox, 52%.  Co-ox 67% this morning. MAPs 70s.  CVP 11  - Continue dobutamine at 2.5 mcg - Continue losartan 12.5 mg daily. -  Add spironolactone 12.5 daily.  - No beta blockade yet with recent CHB.  -  If ends up needing PPM, would need CRT device.  3. Diabetes: pioglitazone discontinued due to CHF. Plan eventual SGLT2 inhibitor.  4. PAD: s/p left BKA, nonhealing ulcer on right.  5. Crohns disease: Has been on chronic prednisone, continued here.  Higher risk of mechanical complication of MI with prednisone.  6. Complete heart block: Complication of anterior MI with no-reflow. Has TTVP but conducting normally today.  No PPM yet.   Length of Stay: [address] PA-C  06/21/2019, 7:12 AM  Advanced Heart Failure Team Pager [phone] (M-F; 7a - 4p)  Please contact [facility] for night-coverage after hours (4p -7a ) and weekends on [email or web address]  Patient seen with PA, agree with the above note.  He is stable this morning on dobutamine, CVP 11 with co-ox 67%.  No dyspnea or chest pain.  He is now conducting normally sinus tachy in 100s.    General: NAD Neck: JVP 10, no thyromegaly or thyroid nodule.  Lungs: Clear to auscultation bilaterally with normal respiratory effort. CV: Nondisplaced PMI.  Heart regular S1/S2, no S3/S4, 2/6 HSM along sternal border.  No peripheral edema.   Abdomen: Soft, nontender, no hepatosplenomegaly, no distention.  Skin: Intact without lesions or rashes.  Neurologic: Alert and oriented x 3.  Psych: Normal affect. Extremities: s/p left BKA, ulcer right toe.  HEENT: Normal.   Hemodynamically stable currently on dobutamine 2.5.   - Would continue dobutamine today at current dose as conduction recovers.  - Mildly elevated CVP, will give Lasix 40 mg IV bid x 2 doses today.  - Continue losartan, add spironolactone 12.5 today.  - No beta blockade yet with recent CHB.  - If needs PPM, should get CRT.   Rhythm now NSR in 100s, EP following for pacing needs.  Leave temporary wire in today.   He is stable today, but as above, I am concerned about MI-related VSD in septum towards the apex.  PA sat only 51% on RHC but could have happened afterwards.  He is on  prednisone so predisposed.  Also has small to moderate pericardial effusion which is concerning for possible contained rupture as well.  He will get another limited echo today to relook at septum and rule out expanding effusion.  Eventual TEE.   Discussed with Dr Servando Snare, probably would not be surgical candidate given baseline immobility and chronic prednisone use. Will stop Lovenox for now and use SCDs.   CRITICAL CARE Performed by: Loralie Champagne  Total critical care time: 35 minutes  Critical care time was exclusive of separately billable procedures and treating other patients.  Critical care was necessary to treat or prevent imminent or life-threatening deterioration.  Critical care was time spent personally by me on the following activities: development of treatment plan with patient and/or surrogate as well as nursing, discussions with consultants, evaluation of patient's response to treatment, examination of patient, obtaining history from patient or surrogate, ordering and performing treatments and interventions, ordering and review of laboratory studies, ordering and review of radiographic studies, pulse oximetry and re-evaluation of patient's condition.   Loralie Champagne 06/21/2019 8:27 AM

## 2019-06-21 NOTE — Consult Note (Addendum)
[address]       [place] [address]             [phone]        [facility] Medical Record #[id number] Date of Birth: May 08, 1941  Referring: No ref. provider found Primary Care: Emelia Loron Primary Cardiologist:Peter Martinique, MD  Chief Complaint:   Acute anterior MI with Heart Block, Question of Post infract muscular  VSD   History of Present Illness:    The patient is a 78 year old male we are asked to see because of question post infarct VSD.   He has multiple cardiac comorbidities including diabetes, hypertension, hyperlipidemia and PAD.  He has been on steroids for many years s/t severe Chrohn's disease. He is status post recent left BKA for nonhealing wound on his foot. Currently has a wound which is still healing with a wound VAC.  On 06/18/2019 he presented to the hospital via EMS after developing chest pain.  He initially saw his primary care thinking it was indigestion  and EKG was abnormal and EMS was contacted  and he was transferred to the hospital.  EKG showed a new right bundle branch block with ST elevation in leads V1 through 3 consistent with anterior STEMI.  Code STEMI was activated and he was given aspirin and started on intravenous heparin in the emergency department.  Cardiac catheterization as described in the report below.  Echocardiogram is also described in the report below.  He did receive drug-eluting stent x2 to the LAD. TIMI 2 flow was restored.There was no reflow phenomena. Echocardiogram has findings consistent with suspicion for possible VSD but there is some conflicting opinions at this time.   Current Activity/ Functional Status: LBKA amputation limits mobility, uses wheelchair   Zubrod Score: At the time of surgery this patient's most appropriate activity status/level should be described as: []    0    Normal activity, no symptoms []    1    Restricted in physical strenuous activity but ambulatory, able to  do out light work []    2    Ambulatory and capable of self care, unable to do work activities, up and about                 more than 50%  Of the time                            [x]    3    Only limited self care, in bed greater than 50% of waking hours []    4    Completely disabled, no self care, confined to bed or chair []    5    Moribund  Past Medical History:  Diagnosis Date  . Arthritis   . Bacteremia due to Escherichia coli 11/2013  . Cancer (HCC)    basal skin- arm, arm squamous  . Crohn disease ([place])   . Diabetes mellitus without complication ([place])   . DM (diabetes mellitus), type 2 with peripheral vascular complications ([place]) 9/37/1696  . DVT (deep venous thrombosis) ([place]) 11/27/13  . Dyspnea    at times  . History of kidney stones    passed  . History of skin cancer    ARMS & FACE  . Peripheral arterial disease ([place]) 02/15/2019  . Pulmonary embolism ([place]) 11/27/13   . Shoulder problem    left torn ligament-   .  STEMI (ST elevation myocardial infarction) ([facility]) 06/18/2019    Past Surgical History:  Procedure Laterality Date  . ABDOMINAL AORTOGRAM N/A 12/14/2018   Procedure: ABDOMINAL AORTOGRAM;  Surgeon: Waynetta Sandy, MD;  Location: [facility];  Service: Cardiovascular;  Laterality: N/A;  . ABDOMINAL AORTOGRAM W/LOWER EXTREMITY Bilateral 02/17/2019   Procedure: ABDOMINAL AORTOGRAM W/LOWER EXTREMITY;  Surgeon: [name], MD;  Location: [facility];  Service: Cardiovascular;  Laterality: Bilateral;  . ABDOMINAL SURGERY     Bowel resection x2  . AMPUTATION Right 12/21/2018   Procedure: RIGHT LONG FINGER AMPUTATION;  Surgeon: Leanora Cover, MD;  Location: [facility];  Service: Orthopedics;  Laterality: Right;  . AMPUTATION Left 03/03/2019   Procedure: AMPUTATION BELOW KNEE;  Surgeon: [name], MD;  Location: [facility];  Service: Vascular;  Laterality: Left;  . APPLICATION OF WOUND VAC Left 04/09/2019   Procedure:  APPLICATION OF WOUND VAC;  Surgeon: [name], MD;  Location: [facility];  Service: Vascular;  Laterality: Left;  . CHOLECYSTECTOMY    . COLON SURGERY    . CORONARY STENT INTERVENTION N/A 06/18/2019   Procedure: CORONARY STENT INTERVENTION;  Surgeon: Martinique, Peter M, MD;  Location: [facility];  Service: Cardiovascular;  Laterality: N/A;  . CORONARY/GRAFT ACUTE MI REVASCULARIZATION N/A 06/18/2019   Procedure: CORONARY/GRAFT ACUTE MI REVASCULARIZATION;  Surgeon: Martinique, Peter M, MD;  Location: [facility];  Service: Cardiovascular;  Laterality: N/A;  . HIP SURGERY Bilateral    replacement  . JOINT REPLACEMENT    . LEFT HEART CATH AND CORONARY ANGIOGRAPHY N/A 06/18/2019   Procedure: LEFT HEART CATH AND CORONARY ANGIOGRAPHY;  Surgeon: Martinique, Peter M, MD;  Location: [facility];  Service: Cardiovascular;  Laterality: N/A;  . LOWER EXTREMITY ANGIOGRAPHY Right 12/14/2018   Procedure: LOWER EXTREMITY ANGIOGRAPHY;  Surgeon: Waynetta Sandy, MD;  Location: [facility];  Service: Cardiovascular;  Laterality: Right;  . LOWER EXTREMITY ANGIOGRAPHY N/A 06/09/2019   Procedure: LOWER EXTREMITY ANGIOGRAPHY;  Surgeon: [name], MD;  Location: [facility];  Service: Cardiovascular;  Laterality: N/A;  . PERIPHERAL VASCULAR ATHERECTOMY  02/17/2019   Procedure: PERIPHERAL VASCULAR ATHERECTOMY;  Surgeon: [name], MD;  Location: [facility];  Service: Cardiovascular;;  . RIGHT HEART CATH N/A 06/19/2019   Procedure: RIGHT HEART CATH;  Surgeon: Sherren Mocha, MD;  Location: [facility];  Service: Cardiovascular;  Laterality: N/A;  . TEMPORARY PACEMAKER N/A 06/19/2019   Procedure: TEMPORARY PACEMAKER;  Surgeon: Sherren Mocha, MD;  Location: [facility];  Service: Cardiovascular;  Laterality: N/A;  . UPPER EXTREMITY ANGIOGRAPHY Right 12/14/2018   Procedure: Right UPPER EXTREMITY ANGIOGRAPHY;  Surgeon: Waynetta Sandy, MD;  Location: [facility];  Service: Cardiovascular;  Laterality: Right;  . WOUND DEBRIDEMENT Left 04/09/2019   Procedure: DEBRIDEMENT WOUND LEFT BELOW KNEE STUMP;  Surgeon: [name], MD;  Location: [facility];  Service: Vascular;  Laterality: Left;    Social History   Tobacco Use  Smoking Status Former Smoker  . Years: 10.00  . Types: Pipe  Smokeless Tobacco Former Systems developer  . Quit date: 12/08/1979    Social History   Substance and Sexual Activity  Alcohol Use No  . Alcohol/week: 0.0 standard drinks     Allergies  Allergen Reactions  . Ciprofloxacin Other (See Comments)    tendons hurt  . Shrimp [Shellfish Allergy]     Current Facility-Administered  Medications  Medication Dose Route Frequency Provider Last Rate Last Admin  . 0.9 %  sodium chloride infusion  250 mL Intravenous PRN Martinique, Peter M, MD 10 mL/hr at 06/20/19 0700 Restarted at 06/20/19 0700  . 0.9 %  sodium chloride infusion   Intravenous PRN Martinique, Peter M, MD      . acetaminophen (TYLENOL) tablet 650 mg  650 mg Oral Q4H PRN Barrett, Rhonda G, PA-C      . albuterol (PROVENTIL) (2.5 MG/3ML) 0.083% nebulizer solution 3 mL  3 mL Inhalation Q4H PRN Martinique, Peter M, MD      . allopurinol (ZYLOPRIM) tablet 100 mg  100 mg Oral Daily Martinique, Peter M, MD   100 mg at 06/20/19 0951  . ALPRAZolam Duanne Moron) tablet 0.25 mg  0.25 mg Oral BID PRN Barrett, Rhonda G, PA-C   0.25 mg at 06/21/19 0254  . aspirin EC tablet 81 mg  81 mg Oral Q supper Martinique, Peter M, MD   81 mg at 06/20/19 1723  . atorvastatin (LIPITOR) tablet 80 mg  80 mg Oral q1800 Martinique, Peter M, MD   80 mg at 06/20/19 1723  . brimonidine (ALPHAGAN) 0.2 % ophthalmic solution 1 drop  1 drop Both Eyes Q12H Martinique, Peter M, MD   1 drop at 06/20/19 2233   And  . timolol (TIMOPTIC) 0.5 % ophthalmic solution 1 drop  1 drop Both Eyes Q12H Martinique, Peter M, MD   1 drop at 06/20/19 2233  . Chlorhexidine Gluconate Cloth 2 % PADS 6 each  6 each Topical  Daily Martinique, Peter M, MD   6 each at 06/19/19 1011  . cholecalciferol (VITAMIN D3) tablet 2,000 Units  2,000 Units Oral Daily Martinique, Peter M, MD   2,000 Units at 06/20/19 [phone]  . DOBUTamine (DOBUTREX) infusion 4000 mcg/mL  2.5 mcg/kg/min Intravenous Titrated Larey Dresser, MD 2.86 mL/hr at 06/21/19 0500 2.5 mcg/kg/min at 06/21/19 0500  . enoxaparin (LOVENOX) injection 40 mg  40 mg Subcutaneous Q24H Martinique, Peter M, MD   40 mg at 06/21/19 0800  . ferrous sulfate tablet 325 mg  325 mg Oral Daily Martinique, Peter M, MD   325 mg at 06/20/19 9528  . insulin aspart (novoLOG) injection 0-5 Units  0-5 Units Subcutaneous QHS Martinique, Peter M, MD   2 Units at 06/20/19 2229  . insulin aspart (novoLOG) injection 0-9 Units  0-9 Units Subcutaneous TID WC Martinique, Peter M, MD   1 Units at 06/21/19 0730  . latanoprost (XALATAN) 0.005 % ophthalmic solution 1 drop  1 drop Both Eyes QHS Martinique, Peter M, MD   1 drop at 06/20/19 2233  . LORazepam (ATIVAN) tablet 0.5 mg  0.5 mg Oral Daily PRN Martinique, Peter M, MD   0.5 mg at 06/20/19 1021  . losartan (COZAAR) tablet 12.5 mg  12.5 mg Oral Daily Martinique, Peter M, MD   12.5 mg at 06/20/19 0950  . mesalamine (PENTASA) CR capsule 2,000 mg  2,000 mg Oral BID Martinique, Peter M, MD   2,000 mg at 06/20/19 2231  . multivitamin with minerals tablet 1 tablet  1 tablet Oral Daily Martinique, Peter M, MD   1 tablet at 06/20/19 [phone]  . nitroGLYCERIN (NITROSTAT) SL tablet 0.4 mg  0.4 mg Sublingual Q5 Min x 3 PRN Barrett, Rhonda G, PA-C      . nitroGLYCERIN 50 mg in dextrose 5 % 250 mL (0.2 mg/mL) infusion  5 mcg/min Intravenous Continuous Martinique, Peter M, MD   Stopped at  06/19/19 0920  . norepinephrine (LEVOPHED) 74m in 2560mpremix infusion  0-40 mcg/min Intravenous Titrated CoSherren MochaMD   Stopped at 06/20/19 0209  . omega-3 acid ethyl esters (LOVAZA) capsule 1 g  1 g Oral Daily JoMartiniquePeter M, MD   1 g at 06/20/19 0950  . ondansetron (ZOFRAN) injection 4 mg  4 mg Intravenous Q6H PRN  Barrett, Rhonda G, PA-C   4 mg at 06/19/19 0321  . pantoprazole (PROTONIX) EC tablet 40 mg  40 mg Oral Q1500 JoMartiniquePeter M, MD   40 mg at 06/20/19 1514  . potassium chloride SA (KLOR-CON) CR tablet 40 mEq  40 mEq Oral Once McLarey DresserMD      . predniSONE (DELTASONE) tablet 22.5 mg  22.5 mg Oral Q breakfast JoMartiniquePeter M, MD   22.5 mg at 06/21/19 0801  . sodium chloride flush (NS) 0.9 % injection 3 mL  3 mL Intravenous Q12H JoMartiniquePeter M, MD   3 mL at 06/20/19 2234  . sodium chloride flush (NS) 0.9 % injection 3 mL  3 mL Intravenous PRN JoMartiniquePeter M, MD      . spironolactone (ALDACTONE) tablet 12.5 mg  12.5 mg Oral Daily McLarey DresserMD      . ticagrelor (BGreensboro Ophthalmology Asc LLCtablet 90 mg  90 mg Oral BID JoMartiniquePeter M, MD   90 mg at 06/20/19 2231  . traMADol (ULTRAM) tablet 50 mg  50 mg Oral Q6H PRN JoMartiniquePeter M, MD      . vitamin B-12 (CYANOCOBALAMIN) tablet 2,000 mcg  2,000 mcg Oral Daily JoMartiniquePeter M, MD   2,000 mcg at 06/20/19 [id number]. zolpidem (AMBIEN) tablet 5 mg  5 mg Oral QHS PRN Barrett, RhEvelene CroonPA-C        Medications Prior to Admission  Medication Sig Dispense Refill Last Dose  . acetaminophen (TYLENOL) 500 MG tablet Take 500 mg by mouth every 6 (six) hours as needed (pain.).    06/18/2019 at Unknown time  . albuterol (VENTOLIN HFA) 108 (90 Base) MCG/ACT inhaler Inhale 2 puffs into the lungs every 4 (four) hours as needed for wheezing or shortness of breath.    06/18/2019 at Unknown time  . allopurinol (ZYLOPRIM) 100 MG tablet Take 100 mg by mouth daily.   06/18/2019 at Unknown time  . amLODipine (NORVASC) 2.5 MG tablet Take 2.5 mg by mouth daily with supper.    Past Week at Unknown time  . aspirin EC 81 MG tablet Take 81 mg by mouth daily with supper.    06/18/2019 at Unknown time  . brimonidine-timolol (COMBIGAN) 0.2-0.5 % ophthalmic solution Place 1 drop into both eyes 2 (two) times a day.   06/18/2019 at Unknown time  . Certolizumab Pegol (CIMZIA) 2 X 200 MG KIT  Inject 400 mg into the skin every 28 (twenty-eight) days.   Past Month at Unknown time  . Cholecalciferol (VITAMIN D) 50 MCG (2000 UT) tablet Take 2,000 Units by mouth daily.   06/18/2019 at Unknown time  . Cyanocobalamin (B-12) 2500 MCG TABS Take 2,500 mcg by mouth daily.   06/18/2019 at Unknown time  . Dexamethasone Acetate 8 MG/ML SUSP Inject 8 mg into the muscle daily as needed (for Crohn's flare).   unknown at Unknown time  . ferrous sulfate 325 (65 FE) MG tablet Take 325 mg by mouth daily.   06/18/2019 at Unknown time  . latanoprost (XALATAN) 0.005 % ophthalmic solution Place 1 drop into both eyes  at bedtime.   06/17/2019 at Unknown time  . LORazepam (ATIVAN) 0.5 MG tablet Take 0.5 mg by mouth daily as needed for anxiety.   06/17/2019 at Unknown time  . mesalamine (PENTASA) 500 MG CR capsule Take 2,000 mg by mouth 2 (two) times daily.   06/18/2019 at Unknown time  . Multiple Vitamin (MULTIVITAMIN WITH MINERALS) TABS Take 1 tablet by mouth daily.   06/18/2019 at Unknown time  . Omega-3 Fatty Acids (FISH OIL) 1000 MG CAPS Take 1,000 mg by mouth daily.   06/18/2019 at Unknown time  . pantoprazole (PROTONIX) 40 MG tablet Take 40 mg by mouth daily in the afternoon.    06/17/2019 at Unknown time  . pioglitazone (ACTOS) 15 MG tablet Take 15 mg by mouth daily.    06/18/2019 at Unknown time  . pravastatin (PRAVACHOL) 10 MG tablet Take 1 tablet (10 mg total) by mouth every evening. (Patient taking differently: Take 10 mg by mouth daily in the afternoon. ) 30 tablet 0 06/17/2019 at Unknown time  . predniSONE (DELTASONE) 10 MG tablet Take 20 mg by mouth daily after breakfast. Total daily dose=22.5 mg (take with 0.5 tablet 25m tablet)   06/18/2019 at Unknown time  . predniSONE (DELTASONE) 5 MG tablet Take 2.5 mg by mouth daily. Total daily dose=22.5 mg (take with #2-10 mg tablets)   06/18/2019 at Unknown time  . traMADol (ULTRAM) 50 MG tablet Take 1 tablet (50 mg total) by mouth at bedtime as needed for  moderate pain. (Patient taking differently: Take 50 mg by mouth every 6 (six) hours as needed for moderate pain. )   06/18/2019 at Unknown time    Family History  Problem Relation Age of Onset  . Hypertension Mother      Review of Systems:   Review of Systems  Constitutional: Positive for malaise/fatigue. Negative for chills, fever and weight loss.  HENT: Negative.   Eyes: Negative.   Respiratory: Positive for shortness of breath.   Cardiovascular: Positive for chest pain.  Gastrointestinal: Positive for heartburn.  Genitourinary: Negative.   Skin: Negative for itching and rash.  Neurological: Positive for weakness.  + anxious      Conclusion    Prox LAD to Mid LAD lesion is 100% stenosed.  A drug-eluting stent was successfully placed using a STENT RESOLUTE [id number]  A drug-eluting stent was successfully placed using a STENT RESOLUTE ONYX 3.0X34.  Post intervention, there is a 0% residual stenosis.  1st Diag lesion is 100% stenosed.  Mid Cx to Dist Cx lesion is 50% stenosed.  Prox RCA to Mid RCA lesion is 30% stenosed.  There is severe left ventricular systolic dysfunction.  The left ventricular ejection fraction is 25-35% by visual estimate.  LV end diastolic pressure is mildly elevated.  There is no mitral valve regurgitation.   1. 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 Severe diffuse disease in the vessel. S/p emergent stenting with DES x 2 with No reflow phenomenon. TIMI 2 flow restored. CTO of the first diagonal 2. Severe LV dysfunction with apical dyskinesia and anterolateral severe HK. EF 30-35% 3. Mildly elevated LVEDP  Plan; DAPT with ASA and Brilinta for at least one year. Consider indefinite DAPT given PAD and long segment of stents in the LAD Will need beta blocker and ARB therapy +/- aldactone for LV dysfunction. Titrate as tolerated. Will continue IV Ntg overnight. Check Echo.    Recommendations  Antiplatelet/Anticoag  Recommend uninterrupted dual antiplatelet therapy with Aspirin 848mdaily and  Ticagrelor 23m twice daily for a minimum of 12 months (ACS-Class I recommendation).      Medications (Filter: Administrations occurring from 06/18/19 1624 to 06/18/19 1829) (important)  Continuous medications are totaled by the amount administered until 06/18/19 1829.  lidocaine (PF) (XYLOCAINE) 1 % injection (mL) Total volume:  15 mL  Date/Time  Rate/Dose/Volume Action  06/18/19 1640  15 mL Given    heparin injection (Units) Total dose:  13,000 Units  Date/Time  Rate/Dose/Volume Action  06/18/19 1649  7,000 Units Given  1721  3,000 Units Given  1734  3,000 Units Given    ticagrelor (BRILINTA) tablet (mg) Total dose:  180 mg  Date/Time  Rate/Dose/Volume Action  06/18/19 1650  180 mg Given    0.9 % sodium chloride infusion (mL/hr) Total dose:  Cannot be calculated* Dosing weight:  74.8  *Continuous medication not stopped within the calculation time range. Date/Time  Rate/Dose/Volume Action  06/18/19 1711  20 mL/hr New Bag/Given    nitroGLYCERIN 1 mg/10 mL (100 mcg/mL) - IR/CATH LAB (mcg) Total dose:  800 mcg  Date/Time  Rate/Dose/Volume Action  06/18/19 1714  200 mcg Given  1726  200 mcg Given  1737  200 mcg Given  1739  200 mcg Given    Heparin (Porcine) in NaCl 1000-0.9 UT/500ML-% SOLN (mL) Total volume:  500 mL  Date/Time  Rate/Dose/Volume Action  06/18/19 1726  500 mL Given    Heparin (Porcine) in NaCl 1000-0.9 UT/500ML-% SOLN (mL) Total volume:  500 mL  Date/Time  Rate/Dose/Volume Action  06/18/19 1726  500 mL Given    verapamil (ISOPTIN) injection (mcg) Total dose:  800 mcg  Date/Time  Rate/Dose/Volume Action  06/18/19 1732  200 mcg Given  1733  200 mcg Given  1735  200 mcg Given  1739  200 mcg Given    adenosine (ADENOSCAN) for intracoronary use (mcg) Total dose:  60 mcg  Date/Time  Rate/Dose/Volume Action  06/18/19 1744  12 mcg Given  1747  24 mcg Given  1750  24  mcg Given    nitroGLYCERIN 50 mg in dextrose 5 % 250 mL (0.2 mg/mL) infusion (mcg/min) Total dose:  Cannot be calculated* Dosing weight:  74.8  *Continuous medication not stopped within the calculation time range. Date/Time  Rate/Dose/Volume Action  06/18/19 1758  5 mcg/min - 1.5 mL/hr New Bag/Given    iohexol (OMNIPAQUE) 350 MG/ML injection (mL) Total volume:  220 mL  Date/Time  Rate/Dose/Volume Action  06/18/19 1803  220 mL Given    Diagnostic Dominance: Right Left Anterior Descending  Prox LAD to Mid LAD lesion 100% stenosed  Prox LAD to Mid LAD lesion is 100% stenosed. The lesion is calcified.  First Diagonal Branch  1st Diag lesion 100% stenosed  1st Diag lesion is 100% stenosed.  Left Circumflex  Mid Cx to Dist Cx lesion 50% stenosed  Mid Cx to Dist Cx lesion is 50% stenosed.  Third Obtuse Marginal Branch  Vessel is large in size.  Right Coronary Artery  Prox RCA to Mid RCA lesion 30% stenosed  Prox RCA to Mid RCA lesion is 30% stenosed. The lesion is segmental.  Intervention  Prox LAD to Mid LAD lesion  Stent  CATH VISTA GUIDE 6FR XBLAD3.5 guide catheter was inserted. Lesion crossed with guidewire using a WIRE ASAHI PROWATER 180CM. Pre-stent angioplasty was performed using a BALLOON SAPPHIRE 2.5X15. A drug-eluting stent was successfully placed using a STENT RESOLUTE OBULA4.5X64 Stent strut is well apposed. Post-stent angioplasty was not performed.  Stent  A drug-eluting stent was successfully placed using a STENT RESOLUTE ONYX 3.0X34. Stent strut is well apposed. Stent overlaps previously placed stent. Post-stent angioplasty was not performed.  Post-Intervention Lesion Assessment  The intervention was successful. Pre-interventional TIMI flow is 0. Post-intervention TIMI flow is 2. No-reflow occurred during the intervention. IC adenosine, nitroglycerin and verapamil was given.  There is a 0% residual stenosis post intervention.  Wall Motion  Resting                 Left Heart  Left Ventricle The left ventricular size is normal. There is severe left ventricular systolic dysfunction. LV end diastolic pressure is mildly elevated. The left ventricular ejection fraction is 25-35% by visual estimate. There are LV function abnormalities due to segmental dysfunction. There is no evidence of mitral regurgitation.  Coronary Diagrams  Diagnostic Dominance: Right  Intervention       Result status: Edited Result - FINAL    ECHOCARDIOGRAM REPORT       Patient Name:   LENDELL GALLICK Date of Exam: 06/20/2019 Medical Rec #:  [id number]      Height:       67.0 in Accession #:    [id number]     Weight:       168.2 lb Date of Birth:  [birth year]      BSA:          1.88 m Patient Age:    39 years       BP:           94/68 mmHg Patient Gender: M              HR:           82 bpm. Exam Location:  Inpatient  Procedure: 2D Echo and Intracardiac Opacification Agent  Indications:     414.01/I25.10   History:         Patient has prior history of Echocardiogram examinations, most                  recent 06/19/2019. Risk Factors:Hypertension, Diabetes and                  Dyslipidemia.   Sonographer:     Clayton Lefort RDCS (AE) Referring Phys:  [id number] RHONDA G BARRETT Diagnosing Phys: Carlyle Dolly MD    Sonographer Comments: Technically challenging study due to limited acoustic windows, Technically difficult study due to poor echo windows, suboptimal parasternal window and suboptimal apical window. IMPRESSIONS    1. Left ventricular ejection fraction, by visual estimation, is 40%. The left ventricle has normal function. There is severely increased left ventricular hypertrophy.  2. Definity contrast agent was given IV to delineate the left ventricular endocardial borders.  3. Left ventricular diastolic parameters are indeterminate.  4. LV poorly visualized even with contrast. The apex, anterior, and anteroseptal walls appear to be hypokinetic.  5. Global  right ventricle has normal systolic function.The right ventricular size is not well visualized. No increase in right ventricular wall thickness.  6. Left atrial size was normal.  7. Right atrial size was normal.  8. Small pericardial effusion, circumferential and partially loculated. No signs of tamponade physiology.  9. The pericardial effusion is circumferential. 10. The mitral valve is normal in structure. No evidence of mitral valve regurgitation. No evidence of mitral stenosis. 11. The tricuspid valve is normal in structure. Tricuspid valve regurgitation is not demonstrated. 12. The aortic valve has an indeterminant number of cusps. Aortic valve regurgitation is  not visualized. No evidence of aortic valve sclerosis or stenosis. 13. Pulmonic regurgitation is mild. 14. The pulmonic valve was not well visualized. Pulmonic valve regurgitation is mild. 15. The inferior vena cava is normal in size with greater than 50% respiratory variability, suggesting right atrial pressure of 3 mmHg. 16. The interatrial septum was not well visualized.  FINDINGS  Left Ventricle: Left ventricular ejection fraction, by visual estimation, is 40%. The left ventricle has normal function. Definity contrast agent was given IV to delineate the left ventricular endocardial borders. The left ventricle is not well  visualized. There is severely increased left ventricular hypertrophy. Left ventricular diastolic parameters are indeterminate. LV poorly visualized even with contrast. The apex, anterior, and anteroseptal walls appear to be hypokinetic.  Right Ventricle: The right ventricular size is not well visualized. No increase in right ventricular wall thickness. Global RV systolic function is has normal systolic function.  Left Atrium: Left atrial size was normal in size.  Right Atrium: Right atrial size was normal in size  Pericardium: A small pericardial effusion is present. The pericardial effusion is  circumferential. The pericardial effusion appears to contain partially loculated effusion.  Mitral Valve: The mitral valve is normal in structure. There is mild thickening of the mitral valve leaflet(s). No evidence of mitral valve regurgitation. No evidence of mitral valve stenosis by observation.  Tricuspid Valve: The tricuspid valve is normal in structure. Tricuspid valve regurgitation is not demonstrated.  Aortic Valve: The aortic valve has an indeterminant number of cusps. Aortic valve regurgitation is not visualized. The aortic valve is structurally normal, with no evidence of sclerosis or stenosis. Mild aortic valve annular calcification. There is mild  calcification of the aortic valve. Aortic valve mean gradient measures 4.9 mmHg. Aortic valve peak gradient measures 9.1 mmHg. Aortic valve area, by VTI measures 3.00 cm.  Pulmonic Valve: The pulmonic valve was not well visualized. Pulmonic valve regurgitation is mild. Pulmonic regurgitation is mild. No evidence of pulmonic stenosis.  Aorta: The aortic root is normal in size and structure.  Pulmonary Artery: Indeterminate PASP, inadequate TR jet.  Venous: The inferior vena cava is normal in size with greater than 50% respiratory variability, suggesting right atrial pressure of 3 mmHg.  IAS/Shunts: The interatrial septum was not well visualized.    LEFT VENTRICLE PLAX 2D LVIDd:         3.30 cm LVIDs:         2.40 cm LV PW:         2.20 cm LV IVS:        2.10 cm LVOT diam:     2.00 cm LV SV:         24 ml LV SV Index:   12.53 LVOT Area:     3.14 cm    RIGHT VENTRICLE            IVC RV Basal diam:  2.10 cm    IVC diam: 1.80 cm RV S prime:     9.79 cm/s TAPSE (M-mode): 2.2 cm  LEFT ATRIUM           Index       RIGHT ATRIUM           Index LA diam:      2.30 cm 1.22 cm/m  RA Area:     10.50 cm LA Vol (A2C): 16.7 ml 8.89 ml/m  RA Volume:   20.70 ml  11.02 ml/m LA Vol (A4C): 26.2 ml 13.94 ml/m  AORTIC  VALVE AV  Area (Vmax):    2.80 cm AV Area (Vmean):   2.81 cm AV Area (VTI):     3.00 cm AV Vmax:           150.64 cm/s AV Vmean:          103.079 cm/s AV VTI:            0.245 m AV Peak Grad:      9.1 mmHg AV Mean Grad:      4.9 mmHg LVOT Vmax:         134.20 cm/s LVOT Vmean:        92.140 cm/s LVOT VTI:          0.233 m LVOT/AV VTI ratio: 0.95   AORTA Ao Root diam: 3.50 cm Ao Asc diam:  3.00 cm    SHUNTS Systemic VTI:  0.23 m Systemic Diam: 2.00 cm    Carlyle Dolly MD Electronically signed by Carlyle Dolly MD Signature Date/Time: 06/20/2019/10:13:41 AM       Physical Exam: BP 108/65   Pulse (!) 106   Temp 99.2 F (37.3 C) (Oral)   Resp (!) 24   Wt 78 kg   SpO2 93%   BMI 26.93 kg/m    Physical Exam  Constitutional: No distress. He appears chronically ill.  HENT:  Nose: No nasal discharge.  Mouth/Throat: Oropharynx is clear. Pharynx is normal.  Full dentures  Eyes: Pupils are equal, round, and reactive to light. Conjunctivae are normal.  Neck: No JVD present. No neck adenopathy. No thyromegaly present.  Cardiovascular: Normal rate, regular rhythm and normal heart sounds. Exam reveals no gallop.  No murmur heard. Pulses:      Radial pulses are 1+ on the right side and 1+ on the left side.  Dressings Bil groin, LBKA, right foot absent pedal pulses  Pulmonary/Chest: Breath sounds normal. He has no wheezes. He has no rales. He exhibits no tenderness.  Abdominal: Soft. He exhibits distension. There is no abdominal tenderness.  Musculoskeletal:        General: No edema.     Cervical back: Normal range of motion and neck supple.     Comments: LBKA with wound VAC  Neurological: He is alert and oriented to person, place, and time.  Skin: Skin is warm and dry. No rash noted. No cyanosis. No jaundice or pallor.   Diagnostic Studies & Laboratory data:     Recent Radiology Findings:   CARDIAC CATHETERIZATION  Result Date: 06/19/2019 1.  Successful  placement of a transvenous pacing wire from right femoral vein access under fluoroscopic guidance 2.  Right heart catheterization demonstrating hemodynamic findings consistent with low/normal intracardiac filling pressures and low cardiac output (cardiac index 1.99 by Fick and 1.74 by thermodilution) Plan: Continue supportive care.  Volume resuscitation and inotropic therapy with low-dose norepinephrine.  ECHOCARDIOGRAM COMPLETE  Result Date: 06/20/2019   ECHOCARDIOGRAM REPORT   Patient Name:   LUTHER [place]INGS Date of Exam: 06/20/2019 Medical Rec #:  [id number]      Height:       67.0 in Accession #:    [id number]     Weight:       168.2 lb Date of Birth:  1941/06/15      BSA:          1.88 m Patient Age:    63 years       BP:           94/68 mmHg Patient Gender: M  HR:           82 bpm. Exam Location:  Inpatient Procedure: 2D Echo and Intracardiac Opacification Agent Indications:     414.01/I25.10  History:         Patient has prior history of Echocardiogram examinations, most                  recent 06/19/2019. Risk Factors:Hypertension, Diabetes and                  Dyslipidemia.  Sonographer:     Clayton Lefort RDCS (AE) Referring Phys:  [id number] RHONDA G BARRETT Diagnosing Phys: Carlyle Dolly MD  Sonographer Comments: Technically challenging study due to limited acoustic windows, Technically difficult study due to poor echo windows, suboptimal parasternal window and suboptimal apical window. IMPRESSIONS  1. Left ventricular ejection fraction, by visual estimation, is 40%. The left ventricle has normal function. There is severely increased left ventricular hypertrophy.  2. Definity contrast agent was given IV to delineate the left ventricular endocardial borders.  3. Left ventricular diastolic parameters are indeterminate.  4. LV poorly visualized even with contrast. The apex, anterior, and anteroseptal walls appear to be hypokinetic.  5. Global right ventricle has normal systolic function.The right  ventricular size is not well visualized. No increase in right ventricular wall thickness.  6. Left atrial size was normal.  7. Right atrial size was normal.  8. Small pericardial effusion, circumferential and partially loculated. No signs of tamponade physiology.  9. The pericardial effusion is circumferential. 10. The mitral valve is normal in structure. No evidence of mitral valve regurgitation. No evidence of mitral stenosis. 11. The tricuspid valve is normal in structure. Tricuspid valve regurgitation is not demonstrated. 12. The aortic valve has an indeterminant number of cusps. Aortic valve regurgitation is not visualized. No evidence of aortic valve sclerosis or stenosis. 13. Pulmonic regurgitation is mild. 14. The pulmonic valve was not well visualized. Pulmonic valve regurgitation is mild. 15. The inferior vena cava is normal in size with greater than 50% respiratory variability, suggesting right atrial pressure of 3 mmHg. 16. The interatrial septum was not well visualized. FINDINGS  Left Ventricle: Left ventricular ejection fraction, by visual estimation, is 40%. The left ventricle has normal function. Definity contrast agent was given IV to delineate the left ventricular endocardial borders. The left ventricle is not well visualized. There is severely increased left ventricular hypertrophy. Left ventricular diastolic parameters are indeterminate. LV poorly visualized even with contrast. The apex, anterior, and anteroseptal walls appear to be hypokinetic. Right Ventricle: The right ventricular size is not well visualized. No increase in right ventricular wall thickness. Global RV systolic function is has normal systolic function. Left Atrium: Left atrial size was normal in size. Right Atrium: Right atrial size was normal in size Pericardium: A small pericardial effusion is present. The pericardial effusion is circumferential. The pericardial effusion appears to contain partially loculated effusion. Mitral  Valve: The mitral valve is normal in structure. There is mild thickening of the mitral valve leaflet(s). No evidence of mitral valve regurgitation. No evidence of mitral valve stenosis by observation. Tricuspid Valve: The tricuspid valve is normal in structure. Tricuspid valve regurgitation is not demonstrated. Aortic Valve: The aortic valve has an indeterminant number of cusps. Aortic valve regurgitation is not visualized. The aortic valve is structurally normal, with no evidence of sclerosis or stenosis. Mild aortic valve annular calcification. There is mild calcification of the aortic valve. Aortic valve mean gradient measures 4.9 mmHg. Aortic valve  peak gradient measures 9.1 mmHg. Aortic valve area, by VTI measures 3.00 cm. Pulmonic Valve: The pulmonic valve was not well visualized. Pulmonic valve regurgitation is mild. Pulmonic regurgitation is mild. No evidence of pulmonic stenosis. Aorta: The aortic root is normal in size and structure. Pulmonary Artery: Indeterminate PASP, inadequate TR jet. Venous: The inferior vena cava is normal in size with greater than 50% respiratory variability, suggesting right atrial pressure of 3 mmHg. IAS/Shunts: The interatrial septum was not well visualized.  LEFT VENTRICLE PLAX 2D LVIDd:         3.30 cm LVIDs:         2.40 cm LV PW:         2.20 cm LV IVS:        2.10 cm LVOT diam:     2.00 cm LV SV:         24 ml LV SV Index:   12.53 LVOT Area:     3.14 cm  RIGHT VENTRICLE            IVC RV Basal diam:  2.10 cm    IVC diam: 1.80 cm RV S prime:     9.79 cm/s TAPSE (M-mode): 2.2 cm LEFT ATRIUM           Index       RIGHT ATRIUM           Index LA diam:      2.30 cm 1.22 cm/m  RA Area:     10.50 cm LA Vol (A2C): 16.7 ml 8.89 ml/m  RA Volume:   20.70 ml  11.02 ml/m LA Vol (A4C): 26.2 ml 13.94 ml/m  AORTIC VALVE AV Area (Vmax):    2.80 cm AV Area (Vmean):   2.81 cm AV Area (VTI):     3.00 cm AV Vmax:           150.64 cm/s AV Vmean:          103.079 cm/s AV VTI:             0.245 m AV Peak Grad:      9.1 mmHg AV Mean Grad:      4.9 mmHg LVOT Vmax:         134.20 cm/s LVOT Vmean:        92.140 cm/s LVOT VTI:          0.233 m LVOT/AV VTI ratio: 0.95  AORTA Ao Root diam: 3.50 cm Ao Asc diam:  3.00 cm  SHUNTS Systemic VTI:  0.23 m Systemic Diam: 2.00 cm  Carlyle Dolly MD Electronically signed by Carlyle Dolly MD Signature Date/Time: 06/20/2019/10:13:41 AM    Final (Updated)    ECHOCARDIOGRAM COMPLETE  Result Date: 06/19/2019   ECHOCARDIOGRAM REPORT   Patient Name:   KENDREW PACI Date of Exam: 06/19/2019 Medical Rec #:  [id number]      Height:       67.0 in Accession #:    [id number]     Weight:       169.8 lb Date of Birth:  1940/08/16      BSA:          1.89 m Patient Age:    57 years       BP:           94/64 mmHg Patient Gender: M              HR:           95 bpm. Exam Location:  Inpatient Procedure: 2D Echo Indications:    CAD Native Vessel 414.01/I25.10  History:        Patient has no prior history of Echocardiogram examinations.                 Risk Factors:Diabetes, Hypertension and Dyslipidemia.  Sonographer:    Clayton Lefort RDCS (AE) Referring Phys: [phone] Ander Slade [place]  Sonographer Comments: Echo stopped while in progress due to change in patient condition. Nurse notified and stopped at suggestion of nurse. IMPRESSIONS  1. Left ventricular ejection fraction, by visual estimation, is 40 to 45%. The left ventricle has hyperdynamic function. There is severely increased left ventricular hypertrophy. IVSd measures 2.2 cm - consider HCM or infiltrative cardiomyopathy.  2. The left ventricle demonstrates regional wall motion abnormalities.  3. Severe hypokinesis of the left ventricular, entire anteroseptal wall and anterior wall.  4. Severe hypokinesis of the left ventricular, entire septal wall.  5. Global right ventricle has normal systolic function.The right ventricular size is normal. No increase in right ventricular wall thickness.  6. Left atrial size was not well  visualized.  7. Right atrial size was not well visualized.  8. The interatrial septum was not assessed.  9. The ascending aorta was not well visualized. 10. The aortic valve is tricuspid. Aortic valve regurgitation is not visualized. 11. The mitral valve is grossly normal. Trivial mitral valve regurgitation. 12. The tricuspid valve is grossly normal. Tricuspid valve regurgitation is trivial. 13. The pulmonic valve was not well visualized. Pulmonic valve regurgitation is not visualized. FINDINGS  Left Ventricle: Left ventricular ejection fraction, by visual estimation, is 40 to 45%. The left ventricle has hyperdynamic function. Severe hypokinesis of the left ventricular, entire septal wall. Severe hypokinesis of the left ventricular, entire anteroseptal wall and anterior wall. Severe akinesis of the left ventricular, entire apical segment. The left ventricle demonstrates regional wall motion abnormalities. There is severely increased left ventricular hypertrophy. Concentric left ventricular  hypertrophy. Right Ventricle: The right ventricular size is normal. No increase in right ventricular wall thickness. Global RV systolic function is has normal systolic function. Left Atrium: Left atrial size was not well visualized. Right Atrium: Right atrial size was not well visualized Pericardium: There is no evidence of pericardial effusion. Mitral Valve: The mitral valve is grossly normal. Trivial mitral valve regurgitation. Tricuspid Valve: The tricuspid valve is grossly normal. Tricuspid valve regurgitation is trivial. Aortic Valve: The aortic valve is tricuspid. Aortic valve regurgitation is not visualized. Pulmonic Valve: The pulmonic valve was not well visualized. Pulmonic valve regurgitation is not visualized. Pulmonic regurgitation is not visualized. Aorta: The ascending aorta was not well visualized. IAS/Shunts: The interatrial septum was not assessed.  LEFT VENTRICLE PLAX 2D LVIDd:         2.50 cm LVIDs:          1.70 cm LV PW:         2.10 cm LV IVS:        2.20 cm LVOT diam:     2.00 cm LV SV:         14 ml LV SV Index:   7.25 LVOT Area:     3.14 cm  LEFT ATRIUM         Index LA diam:    2.40 cm 1.27 cm/m   AORTA Ao Root diam: 3.30 cm  SHUNTS Systemic Diam: 2.00 cm  Lyman Bishop MD Electronically signed by Lyman Bishop MD Signature Date/Time: 06/19/2019/11:49:10 PM    Final  ECHOCARDIOGRAM LIMITED  Result Date: 06/21/2019   ECHOCARDIOGRAM LIMITED REPORT   Patient Name:   AMEEN MOSTAFA Date of Exam: 06/20/2019 Medical Rec #:  [id number]      Height:       67.0 in Accession #:    [id number]     Weight:       168.2 lb Date of Birth:  08-Nov-1940      BSA:          1.88 m Patient Age:    8 years       BP:           85/63 mmHg Patient Gender: M              HR:           89 bpm. Exam Location:  Inpatient  Procedure: Limited Echo Indications:     CHF-Acute Systolic 034.91 / P91.50  History:         Patient has prior history of Echocardiogram examinations, most                  recent 06/21/2019.  Sonographer:     Clayton Lefort RDCS (AE) Referring Phys:  [place] Diagnosing Phys: Carlyle Dolly MD IMPRESSIONS  1. Limited study to evaluate possible VSD. Difficult visualization. Prior studies from 06/19/19 and from 06/20/19 reviewed in context of current study. There was originally some question about a possible basal inferoseptal VSD. From review of all available images the patient has severe septal hypertrophy with a LVOT with a very sharp nearly 90 degree angle giving the appearance of a potential communication between the ventricles, this does not appear to be a VSD. Clinically a basal septal VSD also would not correlate with his LAD infarct and he has no significant mumrur on exam.Technically difficult bubble study.  2. Mild to moderate pericardial effusion.  3. The tricuspid valve is not assessed. Tricuspid valve regurgitation not assessed.  4. The pulmonic valve was not assessed. Pulmonic valve  regurgitation not assessed.  5. Limited study to evaluate possible VSD  6. Mild to moderate partially loculated pericardial effusion most prominent adjacent to the RV, no signs of tamponade physiology.  7. Results discussed with primary team  8. Aortic root could not be assessed.  9. The aortic valve was not assessed. Aortic valve regurgitation not assessed. 10. Left ventricular ejection fraction, by visual estimation, is not assessed%. The left ventricle has not assessed function. There is not assessed. 11. The mitral valve was not assessed. not assessed mitral valve regurgitation. 12. Global right ventricle was not assessed.The right ventricular size is not assessed. not assessed. FINDINGS  Left Ventricle: Left ventricular ejection fraction, by visual estimation, is not assessed%. Limited study to evaluate possible VSD. Difficult visualization. Prior studies from 06/19/19 and from 06/20/19 reviewed in context of current study. There was originally some question about a possible basal inferoseptal VSD. From review of all available images the patient has severe septal hypertrophy with a LVOT with a very sharp take off at a nearly 90 degree angle (best seen in the 06/19/19 study) giving the false appearance of a potential communication between the ventricles in certain views, this does not appear to be a tue VSD. Clinically a basal septal VSD also would not correlate with his LAD infarct and he has no significant murmur on exam. Technically difficult bubble study. Pericardium: A moderately sized pericardial effusion is present. Mild to moderate partially loculated pericardial effusion most prominent adjacent to  the RV, no signs of tamponade physiology. Tricuspid Valve: The tricuspid valve is not assessed. Tricuspid valve regurgitation not assessed. Aortic Valve: The aortic valve was not assessed. Pulmonic Valve: The pulmonic valve was not assessed. Pulmonic valve regurgitation not assessed. Aorta: Aortic root could  not be assessed.  Carlyle Dolly MD Electronically signed by Carlyle Dolly MD Signature Date/Time: 06/20/2019/4:09:55 PM    Final (Updated)      I have independently reviewed the above radiologic studies and discussed with the patient   Recent Lab Findings: Lab Results  Component Value Date   WBC 14.0 (H) 06/21/2019   HGB 9.6 (L) 06/21/2019   HCT 30.9 (L) 06/21/2019   PLT 178 06/21/2019   GLUCOSE 131 (H) 06/21/2019   CHOL 171 06/19/2019   TRIG 676 (H) 06/19/2019   HDL 38 (L) 06/19/2019   LDLDIRECT 59.7 06/19/2019   LDLCALC UNABLE TO CALCULATE IF TRIGLYCERIDE OVER 400 mg/dL 06/19/2019   ALT 71 (H) 06/19/2019   AST 332 (H) 06/19/2019   NA 142 06/21/2019   K 3.5 06/21/2019   CL 109 06/21/2019   CREATININE 0.91 06/21/2019   BUN 15 06/21/2019   CO2 21 (L) 06/21/2019   TSH 1.282 06/18/2019   INR 1.4 (H) 06/18/2019   HGBA1C 7.3 (H) 06/20/2019      Assessment / Plan:  Acute ant/spt  MI/cardiogenic shock/CHB/ s/p temp pacer wireDESx2 to LAD but no reflow- plans for PPM at some point per EP team Poss VSD- recommend getting TEE to evaluate further On dobutamine for acute systolic CHF-other meds as outlined per AHF team  Diabetes Severe PAD s/p L BKA, poor flow to RLE Chrohns's dz HTN HLD   Patient Active Problem List   Diagnosis Date Noted  . Acute systolic heart failure ([place]) 06/19/2019  . Right bundle branch block 06/19/2019  . Ischemia of left BKA site ([place]) 06/19/2019  . Heart block AV complete ([place])   . STEMI (ST elevation myocardial infarction) ([place]) 06/18/2019  . STEMI involving left anterior descending coronary artery ([place]) 06/18/2019  . Bacteremia due to Escherichia coli 04/27/2019  . Gangrene of left foot ([place]) 03/01/2019  . Sepsis ([place]) 02/15/2019  . Anemia 02/15/2019  . Cellulitis and abscess of toe of left foot 02/15/2019  . Peripheral arterial disease ([place]) 02/15/2019  . DM (diabetes mellitus), type 2 with peripheral vascular complications ([place])  07/37/1062  . Gout 02/15/2019  . Septic shock ([place]) 12/14/2013  . Pulmonary embolism ([place]) 12/14/2013  . Crohn disease ([place]) 12/14/2013     I  spent 55 minutes counseling the patient face to face.   John Giovanni, PA-C 06/21/2019 9:08 AM  Patient seen and examined, films and echo reviewed , discussed with cardiology and HF team  . Soft finding of muscular VSD on echo, right heart cath no elevation of sat in PA. No place for CABG. Patient would be very  poor candidate for VSD repair with multiple medical limiting factors. Will follow with you   Grace Isaac MD      [address] [place] [address] Office [phone]

## 2019-06-21 NOTE — Progress Notes (Addendum)
Progress Note  Patient Name: Rodney Sullivan Date of Encounter: 06/21/2019  Primary Cardiologist: Peter Martinique, MD   Subjective   The patient denies chest pain.  He is not short of breath.  He is somewhat aggravated because he does not know whether to eat breakfast or not.  Spoke with Dr. Lovena Le.  Spoke with Dr. Servando Snare.  Some concern about VSD however last echo report by Dr. Harl Bowie refused diagnosis of VSD.  Patient does not have a murmur.  He did not have a step up on right heart cath performed at the time of pacemaker insertion.  This morning the pacemaker is not properly sensing.  Inpatient Medications    Scheduled Meds: . allopurinol  100 mg Oral Daily  . aspirin EC  81 mg Oral Q supper  . atorvastatin  80 mg Oral q1800  . brimonidine  1 drop Both Eyes Q12H   And  . timolol  1 drop Both Eyes Q12H  . Chlorhexidine Gluconate Cloth  6 each Topical Daily  . cholecalciferol  2,000 Units Oral Daily  . enoxaparin (LOVENOX) injection  40 mg Subcutaneous Q24H  . ferrous sulfate  325 mg Oral Daily  . insulin aspart  0-5 Units Subcutaneous QHS  . insulin aspart  0-9 Units Subcutaneous TID WC  . latanoprost  1 drop Both Eyes QHS  . losartan  12.5 mg Oral Daily  . mesalamine  2,000 mg Oral BID  . multivitamin with minerals  1 tablet Oral Daily  . omega-3 acid ethyl esters  1 g Oral Daily  . pantoprazole  40 mg Oral Q1500  . predniSONE  22.5 mg Oral Q breakfast  . sodium chloride flush  3 mL Intravenous Q12H  . ticagrelor  90 mg Oral BID  . vitamin B-12  2,000 mcg Oral Daily   Continuous Infusions: . sodium chloride 10 mL/hr at 06/20/19 0700  . sodium chloride    . DOBUTamine 2.5 mcg/kg/min (06/21/19 0500)  . nitroGLYCERIN Stopped (06/19/19 0920)  . norepinephrine (LEVOPHED) Adult infusion Stopped (06/20/19 0209)   PRN Meds: sodium chloride, sodium chloride, acetaminophen, albuterol, ALPRAZolam, LORazepam, nitroGLYCERIN, ondansetron (ZOFRAN) IV, sodium chloride flush,  traMADol, zolpidem   Vital Signs    Vitals:   06/21/19 0400 06/21/19 0500 06/21/19 0600 06/21/19 0700  BP: (!) 130/92 (!) 96/57 96/60 108/65  Pulse: (!) 106 (!) 103 (!) 104 (!) 106  Resp: 18 18 (!) 24 (!) 24  Temp:    99.2 F (37.3 C)  TempSrc:    Oral  SpO2: 91% 98% 94% 93%  Weight:        Intake/Output Summary (Last 24 hours) at 06/21/2019 0736 Last data filed at 06/21/2019 0500 Gross per 24 hour  Intake 979.92 ml  Output 150 ml  Net 829.92 ml   Last 3 Weights 06/21/2019 06/20/2019 06/19/2019  Weight (lbs) 171 lb 15.3 oz 168 lb 3.4 oz 169 lb 12.1 oz  Weight (kg) 78 kg 76.3 kg 77 kg      Telemetry    Sinus tachycardia 105 bpm.- Personally Reviewed  ECG   Performed 06/20/2019, demonstrates ventricular pacing.- Personally Reviewed  Physical Exam  Somewhat pale in appearance GEN: No acute distress.   Neck: No JVD Cardiac: RRR, no obvious murmur that would be consistent with VSD.  Tachycardic Respiratory: Clear to auscultation bilaterally. GI: Soft, nontender, non-distended  MS:  Left BKA Neuro:  Nonfocal  Psych: Normal affect   Labs    High Sensitivity Troponin:   Recent Labs  Lab 06/18/19 1815 06/18/19 1957 06/18/19 2207  TROPONINIHS >27,000* >27,000* >27,000*      Chemistry Recent Labs  Lab 06/18/19 1815 06/18/19 1957 06/19/19 0356 06/20/19 0339 06/21/19 0538  NA 137 140 139 141 142  K 4.3 4.5 4.1 3.4* 3.5  CL 103 103 102 106 109  CO2 20* 23 23 21* 21*  GLUCOSE 153* 140* 138* 156* 131*  BUN 8 8 11 18 15   CREATININE 0.84 0.78 1.02 1.10 0.91  CALCIUM 8.4* 8.8* 8.7* 8.6* 8.4*  PROT 5.2* 5.3* 5.0*  --   --   ALBUMIN 2.8* 3.0* 2.9*  --   --   AST 236* 490* 332*  --   --   ALT 54* 78* 71*  --   --   ALKPHOS 45 50 47  --   --   BILITOT 0.8 0.7 1.1  --   --   GFRNONAA >60 >60 >60 >60 >60  GFRAA >60 >60 >60 >60 >60  ANIONGAP 14 14 14 14 12      Hematology Recent Labs  Lab 06/18/19 1957 06/19/19 0356 06/21/19 0538  WBC 12.7* 15.2* 14.0*    RBC 4.58 4.27 3.62*  HGB 11.8* 10.9* 9.6*  HCT 38.1* 35.3* 30.9*  MCV 83.2 82.7 85.4  MCH 25.8* 25.5* 26.5  MCHC 31.0 30.9 31.1  RDW 17.0* 17.2* 17.4*  PLT 283 264 178    BNP Recent Labs  Lab 06/19/19 0356  BNP 268.9*     DDimer No results for input(s): DDIMER in the last 168 hours.   Radiology    CARDIAC CATHETERIZATION  Result Date: 06/19/2019 1.  Successful placement of a transvenous pacing wire from right femoral vein access under fluoroscopic guidance 2.  Right heart catheterization demonstrating hemodynamic findings consistent with low/normal intracardiac filling pressures and low cardiac output (cardiac index 1.99 by Fick and 1.74 by thermodilution) Plan: Continue supportive care.  Volume resuscitation and inotropic therapy with low-dose norepinephrine.  ECHOCARDIOGRAM COMPLETE  Result Date: 06/20/2019   ECHOCARDIOGRAM REPORT   Patient Name:   Rodney Sullivan Date of Exam: 06/20/2019 Medical Rec #:  [id number]      Height:       67.0 in Accession #:    [id number]     Weight:       168.2 lb Date of Birth:  [birth year]      BSA:          1.88 m Patient Age:    78 years       BP:           94/68 mmHg Patient Gender: M              HR:           82 bpm. Exam Location:  Inpatient Procedure: 2D Echo and Intracardiac Opacification Agent Indications:     414.01/I25.10  History:         Patient has prior history of Echocardiogram examinations, most                  recent 06/19/2019. Risk Factors:Hypertension, Diabetes and                  Dyslipidemia.  Sonographer:     Clayton Lefort RDCS (AE) Referring Phys:  1993 RHONDA G BARRETT Diagnosing Phys: Carlyle Dolly MD  Sonographer Comments: Technically challenging study due to limited acoustic windows, Technically difficult study due to poor echo windows, suboptimal parasternal window and suboptimal apical window. IMPRESSIONS  1. Left  ventricular ejection fraction, by visual estimation, is 40%. The left ventricle has normal function. There is  severely increased left ventricular hypertrophy.  2. Definity contrast agent was given IV to delineate the left ventricular endocardial borders.  3. Left ventricular diastolic parameters are indeterminate.  4. LV poorly visualized even with contrast. The apex, anterior, and anteroseptal walls appear to be hypokinetic.  5. Global right ventricle has normal systolic function.The right ventricular size is not well visualized. No increase in right ventricular wall thickness.  6. Left atrial size was normal.  7. Right atrial size was normal.  8. Small pericardial effusion, circumferential and partially loculated. No signs of tamponade physiology.  9. The pericardial effusion is circumferential. 10. The mitral valve is normal in structure. No evidence of mitral valve regurgitation. No evidence of mitral stenosis. 11. The tricuspid valve is normal in structure. Tricuspid valve regurgitation is not demonstrated. 12. The aortic valve has an indeterminant number of cusps. Aortic valve regurgitation is not visualized. No evidence of aortic valve sclerosis or stenosis. 13. Pulmonic regurgitation is mild. 14. The pulmonic valve was not well visualized. Pulmonic valve regurgitation is mild. 15. The inferior vena cava is normal in size with greater than 50% respiratory variability, suggesting right atrial pressure of 3 mmHg. 16. The interatrial septum was not well visualized. FINDINGS  Left Ventricle: Left ventricular ejection fraction, by visual estimation, is 40%. The left ventricle has normal function. Definity contrast agent was given IV to delineate the left ventricular endocardial borders. The left ventricle is not well visualized. There is severely increased left ventricular hypertrophy. Left ventricular diastolic parameters are indeterminate. LV poorly visualized even with contrast. The apex, anterior, and anteroseptal walls appear to be hypokinetic. Right Ventricle: The right ventricular size is not well visualized. No  increase in right ventricular wall thickness. Global RV systolic function is has normal systolic function. Left Atrium: Left atrial size was normal in size. Right Atrium: Right atrial size was normal in size Pericardium: A small pericardial effusion is present. The pericardial effusion is circumferential. The pericardial effusion appears to contain partially loculated effusion. Mitral Valve: The mitral valve is normal in structure. There is mild thickening of the mitral valve leaflet(s). No evidence of mitral valve regurgitation. No evidence of mitral valve stenosis by observation. Tricuspid Valve: The tricuspid valve is normal in structure. Tricuspid valve regurgitation is not demonstrated. Aortic Valve: The aortic valve has an indeterminant number of cusps. Aortic valve regurgitation is not visualized. The aortic valve is structurally normal, with no evidence of sclerosis or stenosis. Mild aortic valve annular calcification. There is mild calcification of the aortic valve. Aortic valve mean gradient measures 4.9 mmHg. Aortic valve peak gradient measures 9.1 mmHg. Aortic valve area, by VTI measures 3.00 cm. Pulmonic Valve: The pulmonic valve was not well visualized. Pulmonic valve regurgitation is mild. Pulmonic regurgitation is mild. No evidence of pulmonic stenosis. Aorta: The aortic root is normal in size and structure. Pulmonary Artery: Indeterminate PASP, inadequate TR jet. Venous: The inferior vena cava is normal in size with greater than 50% respiratory variability, suggesting right atrial pressure of 3 mmHg. IAS/Shunts: The interatrial septum was not well visualized.  LEFT VENTRICLE PLAX 2D LVIDd:         3.30 cm LVIDs:         2.40 cm LV PW:         2.20 cm LV IVS:        2.10 cm LVOT diam:     2.00 cm  LV SV:         24 ml LV SV Index:   12.53 LVOT Area:     3.14 cm  RIGHT VENTRICLE            IVC RV Basal diam:  2.10 cm    IVC diam: 1.80 cm RV S prime:     9.79 cm/s TAPSE (M-mode): 2.2 cm LEFT ATRIUM            Index       RIGHT ATRIUM           Index LA diam:      2.30 cm 1.22 cm/m  RA Area:     10.50 cm LA Vol (A2C): 16.7 ml 8.89 ml/m  RA Volume:   20.70 ml  11.02 ml/m LA Vol (A4C): 26.2 ml 13.94 ml/m  AORTIC VALVE AV Area (Vmax):    2.80 cm AV Area (Vmean):   2.81 cm AV Area (VTI):     3.00 cm AV Vmax:           150.64 cm/s AV Vmean:          103.079 cm/s AV VTI:            0.245 m AV Peak Grad:      9.1 mmHg AV Mean Grad:      4.9 mmHg LVOT Vmax:         134.20 cm/s LVOT Vmean:        92.140 cm/s LVOT VTI:          0.233 m LVOT/AV VTI ratio: 0.95  AORTA Ao Root diam: 3.50 cm Ao Asc diam:  3.00 cm  SHUNTS Systemic VTI:  0.23 m Systemic Diam: 2.00 cm  Carlyle Dolly MD Electronically signed by Carlyle Dolly MD Signature Date/Time: 06/20/2019/10:13:41 AM    Final (Updated)    ECHOCARDIOGRAM COMPLETE  Result Date: 06/19/2019   ECHOCARDIOGRAM REPORT   Patient Name:   TIBURCIO LINDER Date of Exam: 06/19/2019 Medical Rec #:  [id number]      Height:       67.0 in Accession #:    [id number]     Weight:       169.8 lb Date of Birth:  March 13, 1941      BSA:          1.89 m Patient Age:    84 years       BP:           94/64 mmHg Patient Gender: M              HR:           95 bpm. Exam Location:  Inpatient Procedure: 2D Echo Indications:    CAD Native Vessel 414.01/I25.10  History:        Patient has no prior history of Echocardiogram examinations.                 Risk Factors:Diabetes, Hypertension and Dyslipidemia.  Sonographer:    Clayton Lefort RDCS (AE) Referring Phys: [phone] Ander Slade Martin[place]  Sonographer Comments: Echo stopped while in progress due to change in patient condition. Nurse notified and stopped at suggestion of nurse. IMPRESSIONS  1. Left ventricular ejection fraction, by visual estimation, is 40 to 45%. The left ventricle has hyperdynamic function. There is severely increased left ventricular hypertrophy. IVSd measures 2.2 cm - consider HCM or infiltrative cardiomyopathy.  2. The left ventricle  demonstrates regional wall motion abnormalities.  3. Severe hypokinesis of the  left ventricular, entire anteroseptal wall and anterior wall.  4. Severe hypokinesis of the left ventricular, entire septal wall.  5. Global right ventricle has normal systolic function.The right ventricular size is normal. No increase in right ventricular wall thickness.  6. Left atrial size was not well visualized.  7. Right atrial size was not well visualized.  8. The interatrial septum was not assessed.  9. The ascending aorta was not well visualized. 10. The aortic valve is tricuspid. Aortic valve regurgitation is not visualized. 11. The mitral valve is grossly normal. Trivial mitral valve regurgitation. 12. The tricuspid valve is grossly normal. Tricuspid valve regurgitation is trivial. 13. The pulmonic valve was not well visualized. Pulmonic valve regurgitation is not visualized. FINDINGS  Left Ventricle: Left ventricular ejection fraction, by visual estimation, is 40 to 45%. The left ventricle has hyperdynamic function. Severe hypokinesis of the left ventricular, entire septal wall. Severe hypokinesis of the left ventricular, entire anteroseptal wall and anterior wall. Severe akinesis of the left ventricular, entire apical segment. The left ventricle demonstrates regional wall motion abnormalities. There is severely increased left ventricular hypertrophy. Concentric left ventricular  hypertrophy. Right Ventricle: The right ventricular size is normal. No increase in right ventricular wall thickness. Global RV systolic function is has normal systolic function. Left Atrium: Left atrial size was not well visualized. Right Atrium: Right atrial size was not well visualized Pericardium: There is no evidence of pericardial effusion. Mitral Valve: The mitral valve is grossly normal. Trivial mitral valve regurgitation. Tricuspid Valve: The tricuspid valve is grossly normal. Tricuspid valve regurgitation is trivial. Aortic Valve: The aortic  valve is tricuspid. Aortic valve regurgitation is not visualized. Pulmonic Valve: The pulmonic valve was not well visualized. Pulmonic valve regurgitation is not visualized. Pulmonic regurgitation is not visualized. Aorta: The ascending aorta was not well visualized. IAS/Shunts: The interatrial septum was not assessed.  LEFT VENTRICLE PLAX 2D LVIDd:         2.50 cm LVIDs:         1.70 cm LV PW:         2.10 cm LV IVS:        2.20 cm LVOT diam:     2.00 cm LV SV:         14 ml LV SV Index:   7.25 LVOT Area:     3.14 cm  LEFT ATRIUM         Index LA diam:    2.40 cm 1.27 cm/m   AORTA Ao Root diam: 3.30 cm  SHUNTS Systemic Diam: 2.00 cm  Lyman Bishop MD Electronically signed by Lyman Bishop MD Signature Date/Time: 06/19/2019/11:49:10 PM    Final    ECHOCARDIOGRAM LIMITED  Result Date: 06/20/2019   ECHOCARDIOGRAM LIMITED REPORT   Patient Name:   TREYSHON BUCHANON Date of Exam: 06/20/2019 Medical Rec #:  [id number]      Height:       67.0 in Accession #:    [id number]     Weight:       168.2 lb Date of Birth:  Mar 05, 1941      BSA:          1.88 m Patient Age:    69 years       BP:           85/63 mmHg Patient Gender: M              HR:           89 bpm. Exam Location:  Inpatient  Procedure: Limited Echo Indications:    CHF-Acute Systolic 623.76 / E83.15  History:        Patient has prior history of Echocardiogram examinations, most                 recent 06/21/2019.  Sonographer:    Clayton Lefort RDCS (AE) Referring Phys: [place]  1. Limited study to evaluate possibly VSD. Difficult visualization. Prior studies from 06/19/19 and from 06/20/19 reviewed in context of current study. There was originally some question about a possible basal inferoseptal VSD. From review of all available images the patient has severe septal hypertrophy with a LVOT with a very sharp nearly 90 degree angle giving the appearance of a potential communication between the ventricles, this is not appear to be a VSD.  Clinically a basal septal VSD also would not correlate with his LAD infarct and he has no significant mumrur on exam.Technically difficult bubble study.  2. Mild to moderate pericardial effusion.  3. The tricuspid valve is not assessed. Tricuspid valve regurgitation not assessed.  4. The pulmonic valve was not assessed. Pulmonic valve regurgitation not assessed.  5. Limited study to evaluate possible VSD  6. Mild to moderate partially loculated pericardial effusion most prominent adjacent to the RV, no signs of tamponade physiology.  7. Results discussed with primary team  8. Aortic root could not be assessed.  9. The aortic valve was not assessed. Aortic valve regurgitation not assessed. 10. Left ventricular ejection fraction, by visual estimation, is not assessed%. The left ventricle has not assessed function. There is not assessed. 11. The mitral valve was not assessed. not assessed mitral valve regurgitation. 12. Global right ventricle was not assessed.The right ventricular size is not assessed. not assessed. FINDINGS  Left Ventricle: Left ventricular ejection fraction, by visual estimation, is not assessed%. Limited study to evaluate possible VSD. Difficult visualization. Prior studies from 06/19/19 and from 06/20/19 reviewed in context of current study. There was originally some question about a possible basal inferoseptal VSD. From review of all available images the patient has severe septal hypertrophy with a LVOT with a very sharp take off at a nearly 90 degree angle (best seen in the 06/19/19 study) giving the false appearance of a potential communication between the ventricles in certain views, this does not appear to be a tue VSD. Clinically a basal septal VSD also would not correlate with his LAD infarct and he has no significant murmur on exam. Technically difficult bubble study. Pericardium: A moderately sized pericardial effusion is present. Mild to moderate partially loculated pericardial effusion  most prominent adjacent to the RV, no signs of tamponade physiology. Tricuspid Valve: The tricuspid valve is not assessed. Tricuspid valve regurgitation not assessed. Aortic Valve: The aortic valve was not assessed. Pulmonic Valve: The pulmonic valve was not assessed. Pulmonic valve regurgitation not assessed. Aorta: Aortic root could not be assessed.  Carlyle Dolly MD Electronically signed by Carlyle Dolly MD Signature Date/Time: 06/20/2019/4:09:55 PM    Final     Cardiac Studies   I have reviewed echoes with Dr Aundra Dubin.  The distal septum does have disruption and could actually be a prerupture before developing true VSD.  This will be followed closely..    Patient Profile     78 y.o. male with history of PVD, left BKA with wound VAC, prior smoker, type 2 diabetes mellitus, E. coli bacteremia, and late presenting anteroseptal ST elevation myocardial infarction treated with DES and complicated by no reflow.  Assessment & Plan    1. Anterior ST elevation MI: Complicated by high-grade AV block after first dose of carvedilol.  Has acute systolic heart failure based upon EF however filling pressures at the time of right heart catheterization during pacemaker implantation was relatively low.  There was also some question of post infarct VSD from multiple echo reports that have varying conclusions.  The most recent interpretation is that VSD is not present. 2. Acute systolic heart failure: He does not have clinical features of volume overload.  He did have low cardiac output on right heart cath.  He is being seen by advanced heart failure and is on low-dose inotropic support.  He will need guideline directed therapy which should include a beta-blocker and angiotensin system blockade either in the form of ACE/ARB or Arni.  Question whether it would be reasonable to start Corlanor today since we have had difficulty with AV block on beta-blocker therapy. 3. PAD with left BKA: Consider whether he is a  candidate for low-dose rivaroxaban (as per [facility] and [facility] PAD trials) 4. Diabetes mellitus II: Consider SGLT2 therapy. 5. Third-degree heart block: This occurred in the setting of large anterior infarction after the initiation of low-dose beta-blocker therapy 36 hours post infarct.  He is now back in normal conduction.  No pacemaker today per EP. 6. Distal anterior septal disruption: Could represent a previous defining in this late presenting anterior myocardial infarction.  Will not be a surgical candidate given PAD and other significant comorbidities.  We are currently in the high risk time zone for rupture.  Overall plan is to coordinate with EP and advanced heart failure team concerning ongoing management.  Current plan is to turn his pacemaker on to ensure that we can capture.  If this is possible we may then start beta-blocker therapy to determine if he will be able to tolerate while we still have the pacemaker in place.  EP is already determined that they will not place a pacemaker today.  Guarded prognosis despite the patient's relatively stable appearance at this time.  For questions or updates, please contact [place] Please consult [email or web address] for contact info under        Signed, Sinclair Grooms, MD  06/21/2019, 7:36 AM   A

## 2019-06-21 NOTE — CV Procedure (Addendum)
Procedure: TEE  Indication: MI-related VSD, LV apical rupture.   Sedation: Versed 2 mg IV, Fentanyl 50 mcg IV.   Findings: Please see echo section for full report.  Mild concentric LV hypertrophy with moderate-severe basal septal hypertrophy (sigmoid septum).  LV EF appears to be around 35-40% with akinesis of the apex and the peri-apical segments. There is a serpiginous VSD in the mid-apical septum with left to right flow.  There also does appear to be a small rupture in the apex communicating with the pericardium.  The right ventricle overall has normal size and systolic function, but the apical RV is akinetic. No significant tricuspid regurgitation. Trivial mitral regurgitation.  The aortic valve is mildly calcified and trileaflet, with no significant regurgitation or stenosis. No ASD/PFO by color doppler.  Normal caliber aortic root. Moderate pericardial effusion with hematoma noted, no evidence for tamponade.   Impression: Suspect infarct-related VSD in the mid-apical interventricular septum. Suspect small LV apical rupture communicating with the pericardial space.   I have discussed the above during the day with Dr. Servando Snare.  Patient thought to be a poor surgical candidate.  For now, will manage conservatively. He will be at high risk for SCD and progression of CHF, worsening of pericardial effusion.  Currently stable on dobutamine 1.5 mcg/kg/min.  The VSD is complicated with a winding course and there is an associated small apical rupture, suspect tissue is not strong in those areas so suspect that percutaneous closure is not a good option.   Rodney Sullivan 06/21/2019 4:49 PM

## 2019-06-21 NOTE — Progress Notes (Addendum)
Advanced Heart Failure Rounding Note  PCP-Cardiologist: Peter Martinique, MD  AHF: Dr. Aundra Dubin   Subjective:    Admitted for acute anterior MI c/b cardiogenic shock and CHB. S/p DES to LAD. EF ~35%. Has temp pacer wire for CHB, but is is now conducting normally.   No events overnight. CP free. No dyspnea.   Co-ox 67% on Dobutamine 2.5,  MAPs in the 70s. CVP 11-12. Sinus tach 106 on tele. No VT.  Possible infarct-related VSD on imaging from 12/13.  RHC from 12/12 showed PA sat 51% which is not suggestive of VSD, but could have occurred between [place] and echo.  Also has small to moderate pleural effusion with is concerning.    Objective:   Weight Range: 78 kg Body mass index is 26.93 kg/m.   Vital Signs:   Temp:  [98.1 F (36.7 C)-99.3 F (37.4 C)] 99.2 F (37.3 C) (12/14 0700) Pulse Rate:  [79-110] 104 (12/14 0600) Resp:  [12-24] 24 (12/14 0600) BP: (85-130)/(51-92) 96/60 (12/14 0600) SpO2:  [91 %-99 %] 94 % (12/14 0600) Weight:  [78 kg] 78 kg (12/14 0300) Last BM Date: 06/20/19  Weight change: Filed Weights   06/19/19 0500 06/20/19 0340 06/21/19 0300  Weight: 77 kg 76.3 kg 78 kg    Intake/Output:   Intake/Output Summary (Last 24 hours) at 06/21/2019 0712 Last data filed at 06/21/2019 0500 Gross per 24 hour  Intake 979.92 ml  Output 150 ml  Net 829.92 ml      Physical Exam    CVP 11 General:  Elderly WM Well appearing. No resp difficulty HEENT: Normal Neck: Supple. JVP 10 cm. Carotids 2+ bilat; no bruits. No lymphadenopathy or thyromegaly appreciated. Cor: PMI nondisplaced. Regular rate & rhythm. No rubs, gallops or murmurs. Lungs: Clear Abdomen: Soft, nontender, nondistended. No hepatosplenomegaly. No bruits or masses. Good bowel sounds. Extremities: s/p left BKA No cyanosis, clubbing, rash, edema Neuro: Alert & orientedx3, cranial nerves grossly intact. moves all 4 extremities w/o difficulty. Affect pleasant   Telemetry   Sinus tach 106 bpm   EKG    No new EKG to review today  Labs    CBC Recent Labs    06/18/19 1957 06/19/19 0356 06/21/19 0538  WBC 12.7* 15.2* 14.0*  NEUTROABS 8.4*  --   --   HGB 11.8* 10.9* 9.6*  HCT 38.1* 35.3* 30.9*  MCV 83.2 82.7 85.4  PLT 283 264 009   Basic Metabolic Panel Recent Labs    06/20/19 0339 06/21/19 0538  NA 141 142  K 3.4* 3.5  CL 106 109  CO2 21* 21*  GLUCOSE 156* 131*  BUN 18 15  CREATININE 1.10 0.91  CALCIUM 8.6* 8.4*   Liver Function Tests Recent Labs    06/18/19 1957 06/19/19 0356  AST 490* 332*  ALT 78* 71*  ALKPHOS 50 47  BILITOT 0.7 1.1  PROT 5.3* 5.0*  ALBUMIN 3.0* 2.9*   No results for input(s): LIPASE, AMYLASE in the last 72 hours. Cardiac Enzymes No results for input(s): CKTOTAL, CKMB, CKMBINDEX, TROPONINI in the last 72 hours.  BNP: BNP (last 3 results) Recent Labs    06/19/19 0356  BNP 268.9*    ProBNP (last 3 results) No results for input(s): PROBNP in the last 8760 hours.   D-Dimer No results for input(s): DDIMER in the last 72 hours. Hemoglobin A1C Recent Labs    06/20/19 0339  HGBA1C 7.3*   Fasting Lipid Panel Recent Labs    06/19/19 0356  CHOL  171  HDL 38*  LDLCALC UNABLE TO CALCULATE IF TRIGLYCERIDE OVER 400 mg/dL  TRIG 676*  CHOLHDL 4.5  LDLDIRECT 59.7   Thyroid Function Tests Recent Labs    06/18/19 1957  TSH 1.282    Other results:   Imaging    ECHOCARDIOGRAM COMPLETE  Result Date: 06/20/2019   ECHOCARDIOGRAM REPORT   Patient Name:   NISSAN FRAZZINI Date of Exam: 06/20/2019 Medical Rec #:  [id number]      Height:       67.0 in Accession #:    [id number]     Weight:       168.2 lb Date of Birth:  Nov 26, 1940      BSA:          1.88 m Patient Age:    78 years       BP:           94/68 mmHg Patient Gender: M              HR:           82 bpm. Exam Location:  Inpatient Procedure: 2D Echo and Intracardiac Opacification Agent Indications:     414.01/I25.10  History:         Patient has prior history of Echocardiogram  examinations, most                  recent 06/19/2019. Risk Factors:Hypertension, Diabetes and                  Dyslipidemia.  Sonographer:     Clayton Lefort RDCS (AE) Referring Phys:  1993 RHONDA G BARRETT Diagnosing Phys: Carlyle Dolly MD  Sonographer Comments: Technically challenging study due to limited acoustic windows, Technically difficult study due to poor echo windows, suboptimal parasternal window and suboptimal apical window. IMPRESSIONS  1. Left ventricular ejection fraction, by visual estimation, is 40%. The left ventricle has normal function. There is severely increased left ventricular hypertrophy.  2. Definity contrast agent was given IV to delineate the left ventricular endocardial borders.  3. Left ventricular diastolic parameters are indeterminate.  4. LV poorly visualized even with contrast. The apex, anterior, and anteroseptal walls appear to be hypokinetic.  5. Global right ventricle has normal systolic function.The right ventricular size is not well visualized. No increase in right ventricular wall thickness.  6. Left atrial size was normal.  7. Right atrial size was normal.  8. Small pericardial effusion, circumferential and partially loculated. No signs of tamponade physiology.  9. The pericardial effusion is circumferential. 10. The mitral valve is normal in structure. No evidence of mitral valve regurgitation. No evidence of mitral stenosis. 11. The tricuspid valve is normal in structure. Tricuspid valve regurgitation is not demonstrated. 12. The aortic valve has an indeterminant number of cusps. Aortic valve regurgitation is not visualized. No evidence of aortic valve sclerosis or stenosis. 13. Pulmonic regurgitation is mild. 14. The pulmonic valve was not well visualized. Pulmonic valve regurgitation is mild. 15. The inferior vena cava is normal in size with greater than 50% respiratory variability, suggesting right atrial pressure of 3 mmHg. 16. The interatrial septum was not well  visualized. FINDINGS  Left Ventricle: Left ventricular ejection fraction, by visual estimation, is 40%. The left ventricle has normal function. Definity contrast agent was given IV to delineate the left ventricular endocardial borders. The left ventricle is not well visualized. There is severely increased left ventricular hypertrophy. Left ventricular diastolic parameters are indeterminate. LV poorly visualized even with contrast. The  apex, anterior, and anteroseptal walls appear to be hypokinetic. Right Ventricle: The right ventricular size is not well visualized. No increase in right ventricular wall thickness. Global RV systolic function is has normal systolic function. Left Atrium: Left atrial size was normal in size. Right Atrium: Right atrial size was normal in size Pericardium: A small pericardial effusion is present. The pericardial effusion is circumferential. The pericardial effusion appears to contain partially loculated effusion. Mitral Valve: The mitral valve is normal in structure. There is mild thickening of the mitral valve leaflet(s). No evidence of mitral valve regurgitation. No evidence of mitral valve stenosis by observation. Tricuspid Valve: The tricuspid valve is normal in structure. Tricuspid valve regurgitation is not demonstrated. Aortic Valve: The aortic valve has an indeterminant number of cusps. Aortic valve regurgitation is not visualized. The aortic valve is structurally normal, with no evidence of sclerosis or stenosis. Mild aortic valve annular calcification. There is mild calcification of the aortic valve. Aortic valve mean gradient measures 4.9 mmHg. Aortic valve peak gradient measures 9.1 mmHg. Aortic valve area, by VTI measures 3.00 cm. Pulmonic Valve: The pulmonic valve was not well visualized. Pulmonic valve regurgitation is mild. Pulmonic regurgitation is mild. No evidence of pulmonic stenosis. Aorta: The aortic root is normal in size and structure. Pulmonary Artery:  Indeterminate PASP, inadequate TR jet. Venous: The inferior vena cava is normal in size with greater than 50% respiratory variability, suggesting right atrial pressure of 3 mmHg. IAS/Shunts: The interatrial septum was not well visualized.  LEFT VENTRICLE PLAX 2D LVIDd:         3.30 cm LVIDs:         2.40 cm LV PW:         2.20 cm LV IVS:        2.10 cm LVOT diam:     2.00 cm LV SV:         24 ml LV SV Index:   12.53 LVOT Area:     3.14 cm  RIGHT VENTRICLE            IVC RV Basal diam:  2.10 cm    IVC diam: 1.80 cm RV S prime:     9.79 cm/s TAPSE (M-mode): 2.2 cm LEFT ATRIUM           Index       RIGHT ATRIUM           Index LA diam:      2.30 cm 1.22 cm/m  RA Area:     10.50 cm LA Vol (A2C): 16.7 ml 8.89 ml/m  RA Volume:   20.70 ml  11.02 ml/m LA Vol (A4C): 26.2 ml 13.94 ml/m  AORTIC VALVE AV Area (Vmax):    2.80 cm AV Area (Vmean):   2.81 cm AV Area (VTI):     3.00 cm AV Vmax:           150.64 cm/s AV Vmean:          103.079 cm/s AV VTI:            0.245 m AV Peak Grad:      9.1 mmHg AV Mean Grad:      4.9 mmHg LVOT Vmax:         134.20 cm/s LVOT Vmean:        92.140 cm/s LVOT VTI:          0.233 m LVOT/AV VTI ratio: 0.95  AORTA Ao Root diam: 3.50 cm Ao Asc diam:  3.00 cm  SHUNTS Systemic VTI:  0.23 m Systemic Diam: 2.00 cm  Carlyle Dolly MD Electronically signed by Carlyle Dolly MD Signature Date/Time: 06/20/2019/10:13:41 AM    Final (Updated)    ECHOCARDIOGRAM LIMITED  Result Date: 06/20/2019   ECHOCARDIOGRAM LIMITED REPORT   Patient Name:   ARBEN PACKMAN Date of Exam: 06/20/2019 Medical Rec #:  [id number]      Height:       67.0 in Accession #:    [id number]     Weight:       168.2 lb Date of Birth:  05-22-1941      BSA:          1.88 m Patient Age:    16 years       BP:           85/63 mmHg Patient Gender: M              HR:           89 bpm. Exam Location:  Inpatient  Procedure: Limited Echo Indications:    CHF-Acute Systolic 268.34 / H96.22  History:        Patient has prior history of  Echocardiogram examinations, most                 recent 06/21/2019.  Sonographer:    Clayton Lefort RDCS (AE) Referring Phys: [place]  1. Limited study to evaluate possibly VSD. Difficult visualization. Prior studies from 06/19/19 and from 06/20/19 reviewed in context of current study. There was originally some question about a possible basal inferoseptal VSD. From review of all available images the patient has severe septal hypertrophy with a LVOT with a very sharp nearly 90 degree angle giving the appearance of a potential communication between the ventricles, this is not appear to be a VSD. Clinically a basal septal VSD also would not correlate with his LAD infarct and he has no significant mumrur on exam.Technically difficult bubble study.  2. Mild to moderate pericardial effusion.  3. The tricuspid valve is not assessed. Tricuspid valve regurgitation not assessed.  4. The pulmonic valve was not assessed. Pulmonic valve regurgitation not assessed.  5. Limited study to evaluate possible VSD  6. Mild to moderate partially loculated pericardial effusion most prominent adjacent to the RV, no signs of tamponade physiology.  7. Results discussed with primary team  8. Aortic root could not be assessed.  9. The aortic valve was not assessed. Aortic valve regurgitation not assessed. 10. Left ventricular ejection fraction, by visual estimation, is not assessed%. The left ventricle has not assessed function. There is not assessed. 11. The mitral valve was not assessed. not assessed mitral valve regurgitation. 12. Global right ventricle was not assessed.The right ventricular size is not assessed. not assessed. FINDINGS  Left Ventricle: Left ventricular ejection fraction, by visual estimation, is not assessed%. Limited study to evaluate possible VSD. Difficult visualization. Prior studies from 06/19/19 and from 06/20/19 reviewed in context of current study. There was originally some question about a  possible basal inferoseptal VSD. From review of all available images the patient has severe septal hypertrophy with a LVOT with a very sharp take off at a nearly 90 degree angle (best seen in the 06/19/19 study) giving the false appearance of a potential communication between the ventricles in certain views, this does not appear to be a tue VSD. Clinically a basal septal VSD also would not correlate with his LAD infarct and he has no significant murmur on exam.  Technically difficult bubble study. Pericardium: A moderately sized pericardial effusion is present. Mild to moderate partially loculated pericardial effusion most prominent adjacent to the RV, no signs of tamponade physiology. Tricuspid Valve: The tricuspid valve is not assessed. Tricuspid valve regurgitation not assessed. Aortic Valve: The aortic valve was not assessed. Pulmonic Valve: The pulmonic valve was not assessed. Pulmonic valve regurgitation not assessed. Aorta: Aortic root could not be assessed.  Carlyle Dolly MD Electronically signed by Carlyle Dolly MD Signature Date/Time: 06/20/2019/4:09:55 PM    Final       Medications:     Scheduled Medications: . allopurinol  100 mg Oral Daily  . aspirin EC  81 mg Oral Q supper  . atorvastatin  80 mg Oral q1800  . brimonidine  1 drop Both Eyes Q12H   And  . timolol  1 drop Both Eyes Q12H  . Chlorhexidine Gluconate Cloth  6 each Topical Daily  . cholecalciferol  2,000 Units Oral Daily  . enoxaparin (LOVENOX) injection  40 mg Subcutaneous Q24H  . ferrous sulfate  325 mg Oral Daily  . insulin aspart  0-5 Units Subcutaneous QHS  . insulin aspart  0-9 Units Subcutaneous TID WC  . latanoprost  1 drop Both Eyes QHS  . losartan  12.5 mg Oral Daily  . mesalamine  2,000 mg Oral BID  . multivitamin with minerals  1 tablet Oral Daily  . omega-3 acid ethyl esters  1 g Oral Daily  . pantoprazole  40 mg Oral Q1500  . predniSONE  22.5 mg Oral Q breakfast  . sodium chloride flush  3 mL  Intravenous Q12H  . ticagrelor  90 mg Oral BID  . vitamin B-12  2,000 mcg Oral Daily     Infusions: . sodium chloride 10 mL/hr at 06/20/19 0700  . sodium chloride    . DOBUTamine 2.5 mcg/kg/min (06/21/19 0500)  . nitroGLYCERIN Stopped (06/19/19 0920)  . norepinephrine (LEVOPHED) Adult infusion Stopped (06/20/19 0209)     PRN Medications:  sodium chloride, sodium chloride, acetaminophen, albuterol, ALPRAZolam, LORazepam, nitroGLYCERIN, ondansetron (ZOFRAN) IV, sodium chloride flush, traMADol, zolpidem    Patient Profile   78 y.o male with history of extensive PAD, type 2 diabetes, prior PE/DVT, and Crohns disease was admitted with anterior MI. Emergent cath on 12/11 showed acutely occluded mLAD and CTO D1.  LAD was opened with 2 DES but there was no-reflow.  On 12/12, he developed complete heart block and temporary transvenous pacer was placed.  RHC showed low output (CI 1.99 Fick) but PCWP 11. Initial co-ox 52% and started on dobutamine. Echo w/ apical and peri-apical severe akinesis, EF ~ 35%, normal RV function, small pericardial effusion, contrast images w/ ? MI related VSD although not confirmed on the non-contrast images.   Assessment/Plan   1. CAD: Acute anteroseptal MI, somewhat delayed presentation.  Cath with acutely occluded mLAD and CTO D1.  DES x 2 to LAD, but no-reflow. No s/s angina.  - Continue ASA 81 and ticagrelor 90 bid.  - Atorvastatin 80 mg daily.  - no  blocker w/ CHB and low output 2. Acute systolic CHF: Ischemic cardiomyopathy.  EF ~ 35% with apical/peri-apical severe hypokinesis to akinesis.  Initial concerns for possible post-MI VSD however this was ruled out on repeat limited echo. RHC showed low output (CI 1.99 Fick). Started on dobutamine 12/13. Initial co-ox, 52%.  Co-ox 67% this morning. MAPs 70s.  CVP 11  - Continue dobutamine at 2.5 mcg - Continue losartan 12.5 mg daily. -  Add spironolactone 12.5 daily.  - No beta blockade yet with recent CHB.  -  If ends up needing PPM, would need CRT device.  3. Diabetes: pioglitazone discontinued due to CHF. Plan eventual SGLT2 inhibitor.  4. PAD: s/p left BKA, nonhealing ulcer on right.  5. Crohns disease: Has been on chronic prednisone, continued here.  Higher risk of mechanical complication of MI with prednisone.  6. Complete heart block: Complication of anterior MI with no-reflow. Has TTVP but conducting normally today.  No PPM yet.   Length of Stay: [address] PA-C  06/21/2019, 7:12 AM  Advanced Heart Failure Team Pager [phone] (M-F; 7a - 4p)  Please contact [facility] for night-coverage after hours (4p -7a ) and weekends on [email or web address]  Patient seen with PA, agree with the above note.  He is stable this morning on dobutamine, CVP 11 with co-ox 67%.  No dyspnea or chest pain.  He is now conducting normally sinus tachy in 100s.    General: NAD Neck: JVP 10, no thyromegaly or thyroid nodule.  Lungs: Clear to auscultation bilaterally with normal respiratory effort. CV: Nondisplaced PMI.  Heart regular S1/S2, no S3/S4, 2/6 HSM along sternal border.  No peripheral edema.   Abdomen: Soft, nontender, no hepatosplenomegaly, no distention.  Skin: Intact without lesions or rashes.  Neurologic: Alert and oriented x 3.  Psych: Normal affect. Extremities: s/p left BKA, ulcer right toe.  HEENT: Normal.   Hemodynamically stable currently on dobutamine 2.5.   - Would continue dobutamine today at current dose as conduction recovers.  - Mildly elevated CVP, will give Lasix 40 mg IV bid x 2 doses today.  - Continue losartan, add spironolactone 12.5 today.  - No beta blockade yet with recent CHB.  - If needs PPM, should get CRT.   Rhythm now NSR in 100s, EP following for pacing needs.  Leave temporary wire in today.   He is stable today, but as above, I am concerned about MI-related VSD in septum towards the apex.  PA sat only 51% on RHC but could have happened afterwards.  He is on  prednisone so predisposed.  Also has small to moderate pericardial effusion which is concerning for possible contained rupture as well.  He will get another limited echo today to relook at septum and rule out expanding effusion.  Eventual TEE.   Discussed with Dr Servando Snare, probably would not be surgical candidate given baseline immobility and chronic prednisone use. Will stop Lovenox for now and use SCDs.   CRITICAL CARE Performed by: Loralie Champagne  Total critical care time: 35 minutes  Critical care time was exclusive of separately billable procedures and treating other patients.  Critical care was necessary to treat or prevent imminent or life-threatening deterioration.  Critical care was time spent personally by me on the following activities: development of treatment plan with patient and/or surrogate as well as nursing, discussions with consultants, evaluation of patient's response to treatment, examination of patient, obtaining history from patient or surrogate, ordering and performing treatments and interventions, ordering and review of laboratory studies, ordering and review of radiographic studies, pulse oximetry and re-evaluation of patient's condition.   Loralie Champagne 06/21/2019 8:27 AM

## 2019-06-21 NOTE — Consult Note (Addendum)
[facility] Nurse Consult Note: Patient receiving care in [place]. Reason for Consult: L BKA VAC dressing change.  I have requested the following items:  A hospital [facility] machine, a large black foam Kellie Simmering [phone]; a Ezzard Flax [phone]; and a Mepitel dressing Kellie Simmering [phone].  I plan to change the VAC dressing once these supplies are available. Wound type: L BKA surgical wound Measurement: 1.2 cm x 2 cm x 0.4 cm with a tunnel at 6 o'clock that measures 0.8 cm Wound bed: 100% pink, clean Drainage (amount, consistency, odor) none Periwound: intact Dressing procedure/placement/frequency: MWF VAC dressing.   The piece of white foam removed from the tunnel and black foam as well.  One very small piece of white foam cut and placed into the tunnel, topped with black foam. Immediate seal obtained.  Patient tolerated very well.  NPWT changed to inpatient hospital VAC.  Wife in possession of [place] and charger.  I spoke with the patient's vascular surgeon, Dr. Wilmer Floor by telephone, and he is comfortable with discontinuing the NPWT and initiating Xeroform into the tunnel daily beginning Wednesday. Val Riles, RN, MSN, CWOCN, CNS-BC, pager [phone]

## 2019-06-21 NOTE — Progress Notes (Signed)
  Echocardiogram 2D Echocardiogram has been performed.  Rodney Sullivan 06/21/2019, 9:00 AM

## 2019-06-21 NOTE — Progress Notes (Addendum)
Electrophysiology Rounding Note  Patient Name: Rodney Sullivan Date of Encounter: 06/21/2019  Primary Cardiologist: Dr. Martinique Electrophysiologist: New to Dr. Lovena Le   Subjective   The patient is feeling OK this am. Patient denies chest pain, shortness of breath, or any new concerns at this time.   Inpatient Medications    Scheduled Meds: . allopurinol  100 mg Oral Daily  . aspirin EC  81 mg Oral Q supper  . atorvastatin  80 mg Oral q1800  . brimonidine  1 drop Both Eyes Q12H   And  . timolol  1 drop Both Eyes Q12H  . Chlorhexidine Gluconate Cloth  6 each Topical Daily  . cholecalciferol  2,000 Units Oral Daily  . enoxaparin (LOVENOX) injection  40 mg Subcutaneous Q24H  . ferrous sulfate  325 mg Oral Daily  . insulin aspart  0-5 Units Subcutaneous QHS  . insulin aspart  0-9 Units Subcutaneous TID WC  . latanoprost  1 drop Both Eyes QHS  . losartan  12.5 mg Oral Daily  . mesalamine  2,000 mg Oral BID  . multivitamin with minerals  1 tablet Oral Daily  . omega-3 acid ethyl esters  1 g Oral Daily  . pantoprazole  40 mg Oral Q1500  . predniSONE  22.5 mg Oral Q breakfast  . sodium chloride flush  3 mL Intravenous Q12H  . ticagrelor  90 mg Oral BID  . vitamin B-12  2,000 mcg Oral Daily   Continuous Infusions: . sodium chloride 10 mL/hr at 06/20/19 0700  . sodium chloride    . DOBUTamine 2.5 mcg/kg/min (06/21/19 0500)  . nitroGLYCERIN Stopped (06/19/19 0920)  . norepinephrine (LEVOPHED) Adult infusion Stopped (06/20/19 0209)   PRN Meds: sodium chloride, sodium chloride, acetaminophen, albuterol, ALPRAZolam, LORazepam, nitroGLYCERIN, ondansetron (ZOFRAN) IV, sodium chloride flush, traMADol, zolpidem   Vital Signs    Vitals:   06/21/19 0400 06/21/19 0500 06/21/19 0600 06/21/19 0700  BP: (!) 130/92 (!) 96/57 96/60 108/65  Pulse: (!) 106 (!) 103 (!) 104 (!) 106  Resp: 18 18 (!) 24 (!) 24  Temp:    99.2 F (37.3 C)  TempSrc:    Oral  SpO2: 91% 98% 94% 93%  Weight:         Intake/Output Summary (Last 24 hours) at 06/21/2019 0754 Last data filed at 06/21/2019 0500 Gross per 24 hour  Intake 979.92 ml  Output 150 ml  Net 829.92 ml   Filed Weights   06/19/19 0500 06/20/19 0340 06/21/19 0300  Weight: 77 kg 76.3 kg 78 kg    Physical Exam    GEN- The patient is fatigued appearing, alert and oriented x 3 today.   Head- normocephalic, atraumatic Eyes-  Sclera clear, conjunctiva pink Ears- hearing intact Oropharynx- clear Neck- supple Lungs- Clear to ausculation bilaterally, normal work of breathing Heart- Regular rate and rhythm, no murmurs, rubs or gallops GI- soft, NT, ND, + BS Extremities- no clubbing, cyanosis, or edema Skin- no rash or lesion Psych- euthymic mood, full affect Neuro- strength and sensation are intact  Labs    CBC Recent Labs    06/18/19 1957 06/19/19 0356 06/21/19 0538  WBC 12.7* 15.2* 14.0*  NEUTROABS 8.4*  --   --   HGB 11.8* 10.9* 9.6*  HCT 38.1* 35.3* 30.9*  MCV 83.2 82.7 85.4  PLT 283 264 700   Basic Metabolic Panel Recent Labs    06/20/19 0339 06/21/19 0538  NA 141 142  K 3.4* 3.5  CL 106 109  CO2  21* 21*  GLUCOSE 156* 131*  BUN 18 15  CREATININE 1.10 0.91  CALCIUM 8.6* 8.4*   Liver Function Tests Recent Labs    06/18/19 1957 06/19/19 0356  AST 490* 332*  ALT 78* 71*  ALKPHOS 50 47  BILITOT 0.7 1.1  PROT 5.3* 5.0*  ALBUMIN 3.0* 2.9*   No results for input(s): LIPASE, AMYLASE in the last 72 hours. Cardiac Enzymes No results for input(s): CKTOTAL, CKMB, CKMBINDEX, TROPONINI in the last 72 hours. BNP Invalid input(s): POCBNP D-Dimer No results for input(s): DDIMER in the last 72 hours. Hemoglobin A1C Recent Labs    06/20/19 0339  HGBA1C 7.3*   Fasting Lipid Panel Recent Labs    06/19/19 0356  CHOL 171  HDL 38*  LDLCALC UNABLE TO CALCULATE IF TRIGLYCERIDE OVER 400 mg/dL  TRIG 676*  CHOLHDL 4.5  LDLDIRECT 59.7   Thyroid Function Tests Recent Labs    06/18/19 1957  TSH  1.282    Telemetry    NSR/Sinus tach 90-100s (personally reviewed)  Radiology    CARDIAC CATHETERIZATION  Result Date: 06/19/2019 1.  Successful placement of a transvenous pacing wire from right femoral vein access under fluoroscopic guidance 2.  Right heart catheterization demonstrating hemodynamic findings consistent with low/normal intracardiac filling pressures and low cardiac output (cardiac index 1.99 by Fick and 1.74 by thermodilution) Plan: Continue supportive care.  Volume resuscitation and inotropic therapy with low-dose norepinephrine.  ECHOCARDIOGRAM COMPLETE  Result Date: 06/20/2019   ECHOCARDIOGRAM REPORT   Patient Name:   Rodney Sullivan Date of Exam: 06/20/2019 Medical Rec #:  [id number]      Height:       67.0 in Accession #:    [id number]     Weight:       168.2 lb Date of Birth:  [birth year]      BSA:          1.88 m Patient Age:    78 years       BP:           94/68 mmHg Patient Gender: M              HR:           82 bpm. Exam Location:  Inpatient Procedure: 2D Echo and Intracardiac Opacification Agent Indications:     414.01/I25.10  History:         Patient has prior history of Echocardiogram examinations, most                  recent 06/19/2019. Risk Factors:Hypertension, Diabetes and                  Dyslipidemia.  Sonographer:     Clayton Lefort RDCS (AE) Referring Phys:  1993 RHONDA G BARRETT Diagnosing Phys: Carlyle Dolly MD  Sonographer Comments: Technically challenging study due to limited acoustic windows, Technically difficult study due to poor echo windows, suboptimal parasternal window and suboptimal apical window. IMPRESSIONS  1. Left ventricular ejection fraction, by visual estimation, is 40%. The left ventricle has normal function. There is severely increased left ventricular hypertrophy.  2. Definity contrast agent was given IV to delineate the left ventricular endocardial borders.  3. Left ventricular diastolic parameters are indeterminate.  4. LV poorly visualized  even with contrast. The apex, anterior, and anteroseptal walls appear to be hypokinetic.  5. Global right ventricle has normal systolic function.The right ventricular size is not well visualized. No increase in right ventricular wall thickness.  6. Left atrial  size was normal.  7. Right atrial size was normal.  8. Small pericardial effusion, circumferential and partially loculated. No signs of tamponade physiology.  9. The pericardial effusion is circumferential. 10. The mitral valve is normal in structure. No evidence of mitral valve regurgitation. No evidence of mitral stenosis. 11. The tricuspid valve is normal in structure. Tricuspid valve regurgitation is not demonstrated. 12. The aortic valve has an indeterminant number of cusps. Aortic valve regurgitation is not visualized. No evidence of aortic valve sclerosis or stenosis. 13. Pulmonic regurgitation is mild. 14. The pulmonic valve was not well visualized. Pulmonic valve regurgitation is mild. 15. The inferior vena cava is normal in size with greater than 50% respiratory variability, suggesting right atrial pressure of 3 mmHg. 16. The interatrial septum was not well visualized. FINDINGS  Left Ventricle: Left ventricular ejection fraction, by visual estimation, is 40%. The left ventricle has normal function. Definity contrast agent was given IV to delineate the left ventricular endocardial borders. The left ventricle is not well visualized. There is severely increased left ventricular hypertrophy. Left ventricular diastolic parameters are indeterminate. LV poorly visualized even with contrast. The apex, anterior, and anteroseptal walls appear to be hypokinetic. Right Ventricle: The right ventricular size is not well visualized. No increase in right ventricular wall thickness. Global RV systolic function is has normal systolic function. Left Atrium: Left atrial size was normal in size. Right Atrium: Right atrial size was normal in size Pericardium: A small  pericardial effusion is present. The pericardial effusion is circumferential. The pericardial effusion appears to contain partially loculated effusion. Mitral Valve: The mitral valve is normal in structure. There is mild thickening of the mitral valve leaflet(s). No evidence of mitral valve regurgitation. No evidence of mitral valve stenosis by observation. Tricuspid Valve: The tricuspid valve is normal in structure. Tricuspid valve regurgitation is not demonstrated. Aortic Valve: The aortic valve has an indeterminant number of cusps. Aortic valve regurgitation is not visualized. The aortic valve is structurally normal, with no evidence of sclerosis or stenosis. Mild aortic valve annular calcification. There is mild calcification of the aortic valve. Aortic valve mean gradient measures 4.9 mmHg. Aortic valve peak gradient measures 9.1 mmHg. Aortic valve area, by VTI measures 3.00 cm. Pulmonic Valve: The pulmonic valve was not well visualized. Pulmonic valve regurgitation is mild. Pulmonic regurgitation is mild. No evidence of pulmonic stenosis. Aorta: The aortic root is normal in size and structure. Pulmonary Artery: Indeterminate PASP, inadequate TR jet. Venous: The inferior vena cava is normal in size with greater than 50% respiratory variability, suggesting right atrial pressure of 3 mmHg. IAS/Shunts: The interatrial septum was not well visualized.  LEFT VENTRICLE PLAX 2D LVIDd:         3.30 cm LVIDs:         2.40 cm LV PW:         2.20 cm LV IVS:        2.10 cm LVOT diam:     2.00 cm LV SV:         24 ml LV SV Index:   12.53 LVOT Area:     3.14 cm  RIGHT VENTRICLE            IVC RV Basal diam:  2.10 cm    IVC diam: 1.80 cm RV S prime:     9.79 cm/s TAPSE (M-mode): 2.2 cm LEFT ATRIUM           Index       RIGHT ATRIUM  Index LA diam:      2.30 cm 1.22 cm/m  RA Area:     10.50 cm LA Vol (A2C): 16.7 ml 8.89 ml/m  RA Volume:   20.70 ml  11.02 ml/m LA Vol (A4C): 26.2 ml 13.94 ml/m  AORTIC VALVE AV  Area (Vmax):    2.80 cm AV Area (Vmean):   2.81 cm AV Area (VTI):     3.00 cm AV Vmax:           150.64 cm/s AV Vmean:          103.079 cm/s AV VTI:            0.245 m AV Peak Grad:      9.1 mmHg AV Mean Grad:      4.9 mmHg LVOT Vmax:         134.20 cm/s LVOT Vmean:        92.140 cm/s LVOT VTI:          0.233 m LVOT/AV VTI ratio: 0.95  AORTA Ao Root diam: 3.50 cm Ao Asc diam:  3.00 cm  SHUNTS Systemic VTI:  0.23 m Systemic Diam: 2.00 cm  Carlyle Dolly MD Electronically signed by Carlyle Dolly MD Signature Date/Time: 06/20/2019/10:13:41 AM    Final (Updated)    ECHOCARDIOGRAM COMPLETE  Result Date: 06/19/2019   ECHOCARDIOGRAM REPORT   Patient Name:   MAKI SWEETSER Date of Exam: 06/19/2019 Medical Rec #:  [id number]      Height:       67.0 in Accession #:    [id number]     Weight:       169.8 lb Date of Birth:  [birth year]      BSA:          1.89 m Patient Age:    30 years       BP:           94/64 mmHg Patient Gender: M              HR:           95 bpm. Exam Location:  Inpatient Procedure: 2D Echo Indications:    CAD Native Vessel 414.01/I25.10  History:        Patient has no prior history of Echocardiogram examinations.                 Risk Factors:Diabetes, Hypertension and Dyslipidemia.  Sonographer:    Clayton Lefort RDCS (AE) Referring Phys: [phone] Ander Slade [place]  Sonographer Comments: Echo stopped while in progress due to change in patient condition. Nurse notified and stopped at suggestion of nurse. IMPRESSIONS  1. Left ventricular ejection fraction, by visual estimation, is 40 to 45%. The left ventricle has hyperdynamic function. There is severely increased left ventricular hypertrophy. IVSd measures 2.2 cm - consider HCM or infiltrative cardiomyopathy.  2. The left ventricle demonstrates regional wall motion abnormalities.  3. Severe hypokinesis of the left ventricular, entire anteroseptal wall and anterior wall.  4. Severe hypokinesis of the left ventricular, entire septal wall.  5. Global right  ventricle has normal systolic function.The right ventricular size is normal. No increase in right ventricular wall thickness.  6. Left atrial size was not well visualized.  7. Right atrial size was not well visualized.  8. The interatrial septum was not assessed.  9. The ascending aorta was not well visualized. 10. The aortic valve is tricuspid. Aortic valve regurgitation is not visualized. 11. The mitral valve is grossly normal. Trivial mitral valve regurgitation.  12. The tricuspid valve is grossly normal. Tricuspid valve regurgitation is trivial. 13. The pulmonic valve was not well visualized. Pulmonic valve regurgitation is not visualized. FINDINGS  Left Ventricle: Left ventricular ejection fraction, by visual estimation, is 40 to 45%. The left ventricle has hyperdynamic function. Severe hypokinesis of the left ventricular, entire septal wall. Severe hypokinesis of the left ventricular, entire anteroseptal wall and anterior wall. Severe akinesis of the left ventricular, entire apical segment. The left ventricle demonstrates regional wall motion abnormalities. There is severely increased left ventricular hypertrophy. Concentric left ventricular  hypertrophy. Right Ventricle: The right ventricular size is normal. No increase in right ventricular wall thickness. Global RV systolic function is has normal systolic function. Left Atrium: Left atrial size was not well visualized. Right Atrium: Right atrial size was not well visualized Pericardium: There is no evidence of pericardial effusion. Mitral Valve: The mitral valve is grossly normal. Trivial mitral valve regurgitation. Tricuspid Valve: The tricuspid valve is grossly normal. Tricuspid valve regurgitation is trivial. Aortic Valve: The aortic valve is tricuspid. Aortic valve regurgitation is not visualized. Pulmonic Valve: The pulmonic valve was not well visualized. Pulmonic valve regurgitation is not visualized. Pulmonic regurgitation is not visualized. Aorta: The  ascending aorta was not well visualized. IAS/Shunts: The interatrial septum was not assessed.  LEFT VENTRICLE PLAX 2D LVIDd:         2.50 cm LVIDs:         1.70 cm LV PW:         2.10 cm LV IVS:        2.20 cm LVOT diam:     2.00 cm LV SV:         14 ml LV SV Index:   7.25 LVOT Area:     3.14 cm  LEFT ATRIUM         Index LA diam:    2.40 cm 1.27 cm/m   AORTA Ao Root diam: 3.30 cm  SHUNTS Systemic Diam: 2.00 cm  Lyman Bishop MD Electronically signed by Lyman Bishop MD Signature Date/Time: 06/19/2019/11:49:10 PM    Final    ECHOCARDIOGRAM LIMITED  Result Date: 06/20/2019   ECHOCARDIOGRAM LIMITED REPORT   Patient Name:   LIZANDRO SPELLMAN Date of Exam: 06/20/2019 Medical Rec #:  [id number]      Height:       67.0 in Accession #:    [id number]     Weight:       168.2 lb Date of Birth:  [birth year]      BSA:          1.88 m Patient Age:    65 years       BP:           85/63 mmHg Patient Gender: M              HR:           89 bpm. Exam Location:  Inpatient  Procedure: Limited Echo Indications:    CHF-Acute Systolic 387.56 / E33.29  History:        Patient has prior history of Echocardiogram examinations, most                 recent 06/21/2019.  Sonographer:    Clayton Lefort RDCS (AE) Referring Phys: [place]  1. Limited study to evaluate possibly VSD. Difficult visualization. Prior studies from 06/19/19 and from 06/20/19 reviewed in context of current study. There was originally some question about a possible basal inferoseptal  VSD. From review of all available images the patient has severe septal hypertrophy with a LVOT with a very sharp nearly 90 degree angle giving the appearance of a potential communication between the ventricles, this is not appear to be a VSD. Clinically a basal septal VSD also would not correlate with his LAD infarct and he has no significant mumrur on exam.Technically difficult bubble study.  2. Mild to moderate pericardial effusion.  3. The tricuspid valve is not  assessed. Tricuspid valve regurgitation not assessed.  4. The pulmonic valve was not assessed. Pulmonic valve regurgitation not assessed.  5. Limited study to evaluate possible VSD  6. Mild to moderate partially loculated pericardial effusion most prominent adjacent to the RV, no signs of tamponade physiology.  7. Results discussed with primary team  8. Aortic root could not be assessed.  9. The aortic valve was not assessed. Aortic valve regurgitation not assessed. 10. Left ventricular ejection fraction, by visual estimation, is not assessed%. The left ventricle has not assessed function. There is not assessed. 11. The mitral valve was not assessed. not assessed mitral valve regurgitation. 12. Global right ventricle was not assessed.The right ventricular size is not assessed. not assessed. FINDINGS  Left Ventricle: Left ventricular ejection fraction, by visual estimation, is not assessed%. Limited study to evaluate possible VSD. Difficult visualization. Prior studies from 06/19/19 and from 06/20/19 reviewed in context of current study. There was originally some question about a possible basal inferoseptal VSD. From review of all available images the patient has severe septal hypertrophy with a LVOT with a very sharp take off at a nearly 90 degree angle (best seen in the 06/19/19 study) giving the false appearance of a potential communication between the ventricles in certain views, this does not appear to be a tue VSD. Clinically a basal septal VSD also would not correlate with his LAD infarct and he has no significant murmur on exam. Technically difficult bubble study. Pericardium: A moderately sized pericardial effusion is present. Mild to moderate partially loculated pericardial effusion most prominent adjacent to the RV, no signs of tamponade physiology. Tricuspid Valve: The tricuspid valve is not assessed. Tricuspid valve regurgitation not assessed. Aortic Valve: The aortic valve was not assessed. Pulmonic  Valve: The pulmonic valve was not assessed. Pulmonic valve regurgitation not assessed. Aorta: Aortic root could not be assessed.  Carlyle Dolly MD Electronically signed by Carlyle Dolly MD Signature Date/Time: 06/20/2019/4:09:55 PM    Final      Patient Profile     Kanoa Phillippi is a 78 y.o. male with a hx of peripheral vascular disease, HTN who is being seen for the evaluation of CHB at the request of Dr. Tamala Julian.  Assessment & Plan    1. CHB S/p a large MI His conduction has improved overnight.   We will continue to follow for PPM placement, as need for surgery is still being discussed re: and echo with possible VSD.  Temp pacer to remain in place.   2. Chronic systolic CHF Echo 05/01/8526 with LVEF ~ 40% AHF team following.  3. CAD S/p PCI with no reflow on PCI.  He was discussed for CABG but concerns over VSD.   For questions or updates, please contact [place] Please consult [email or web address] for contact info under Cardiology/STEMI.  Signed, Shirley Friar, PA-C  06/21/2019, 7:54 AM   EP Attending  Patient seen and examined. Agree with the findings as noted above. The patient is s/p large anteroseptal MI, complicated by CHB. He has had  return of his AV conduction. For this reason and because of possible other complications including possible VSD and pericardial effusion, EP will follow with hopes that his AV conduction will remain. If not, we will plan PPM later in his stay sooner if conduction worsens.  Mikle Bosworth.D.

## 2019-06-21 NOTE — Interval H&P Note (Signed)
History and Physical Interval Note:  06/21/2019 4:20 PM  Rodney Sullivan  has presented today for surgery, with the diagnosis of check VSD.  The various methods of treatment have been discussed with the patient and family. After consideration of risks, benefits and other options for treatment, the patient has consented to  Procedure(s): TRANSESOPHAGEAL ECHOCARDIOGRAM (TEE) (N/A) as a surgical intervention.  The patient's history has been reviewed, patient examined, no change in status, stable for surgery.  I have reviewed the patient's chart and labs.  Questions were answered to the patient's satisfaction.     Rodney Sullivan [employer]

## 2019-06-22 DIAGNOSIS — Z7189 Other specified counseling: Secondary | ICD-10-CM

## 2019-06-22 DIAGNOSIS — Z515 Encounter for palliative care: Secondary | ICD-10-CM

## 2019-06-22 LAB — CBC
HCT: 31.9 % — ABNORMAL LOW (ref 39.0–52.0)
Hemoglobin: 9.8 g/dL — ABNORMAL LOW (ref 13.0–17.0)
MCH: 26.2 pg (ref 26.0–34.0)
MCHC: 30.7 g/dL (ref 30.0–36.0)
MCV: 85.3 fL (ref 80.0–100.0)
Platelets: 204 10*3/uL (ref 150–400)
RBC: 3.74 MIL/uL — ABNORMAL LOW (ref 4.22–5.81)
RDW: 17.3 % — ABNORMAL HIGH (ref 11.5–15.5)
WBC: 11.4 10*3/uL — ABNORMAL HIGH (ref 4.0–10.5)
nRBC: 0.2 % (ref 0.0–0.2)

## 2019-06-22 LAB — BASIC METABOLIC PANEL
Anion gap: 10 (ref 5–15)
BUN: 15 mg/dL (ref 8–23)
CO2: 22 mmol/L (ref 22–32)
Calcium: 7.9 mg/dL — ABNORMAL LOW (ref 8.9–10.3)
Chloride: 110 mmol/L (ref 98–111)
Creatinine, Ser: 0.98 mg/dL (ref 0.61–1.24)
GFR calc Af Amer: >60 mL/min (ref >60)
GFR calc non Af Amer: >60 mL/min (ref >60)
Glucose, Bld: 155 mg/dL — ABNORMAL HIGH (ref 70–99)
Potassium: 3.3 mmol/L — ABNORMAL LOW (ref 3.5–5.1)
Sodium: 142 mmol/L (ref 135–145)

## 2019-06-22 LAB — COOXEMETRY PANEL
Carboxyhemoglobin: 0.6 % (ref 0.5–1.5)
Carboxyhemoglobin: 0.4 % — ABNORMAL LOW (ref 0.5–1.5)
Methemoglobin: 3.2 % — ABNORMAL HIGH (ref 0.0–1.5)
Methemoglobin: 2.8 % — ABNORMAL HIGH (ref 0.0–1.5)
O2 Saturation: 65.1 %
O2 Saturation: 67 %
Total hemoglobin: 10.3 g/dL — ABNORMAL LOW (ref 12.0–16.0)
Total hemoglobin: 8.1 g/dL — ABNORMAL LOW (ref 12.0–16.0)

## 2019-06-22 LAB — GLUCOSE, CAPILLARY
Glucose-Capillary: 120 mg/dL — ABNORMAL HIGH (ref 70–99)
Glucose-Capillary: 148 mg/dL — ABNORMAL HIGH (ref 70–99)
Glucose-Capillary: 216 mg/dL — ABNORMAL HIGH (ref 70–99)
Glucose-Capillary: 171 mg/dL — ABNORMAL HIGH (ref 70–99)

## 2019-06-22 MED ORDER — FUROSEMIDE 40 MG PO TABS
40.0000 mg | ORAL_TABLET | Freq: Every day | ORAL | Status: DC
  Administered 2019-06-22 – 2019-06-25 (×4): 40 mg via ORAL
  Filled 2019-06-22 (×4): qty 1

## 2019-06-22 MED ORDER — POTASSIUM CHLORIDE CRYS ER 20 MEQ PO TBCR
40.0000 meq | EXTENDED_RELEASE_TABLET | Freq: Once | ORAL | Status: AC
  Administered 2019-06-22: 40 meq via ORAL
  Filled 2019-06-22: qty 2

## 2019-06-22 NOTE — Progress Notes (Signed)
Note discussions of poor prognosis in the setting of mechanical complication of acute MI -> VSD + small apical rupture.    We will follow at a distance.  He has had no further CHB. Discussed with Dr. Lovena Le.   [name] [address]" [place], PA-C  06/22/2019 8:29 AM

## 2019-06-22 NOTE — Progress Notes (Signed)
Called to pull temp pacing wire on [name].  Temp wire removed with HR of 100.  RN is going to remove venous sheath.  Pt vitals stable.

## 2019-06-22 NOTE — Progress Notes (Signed)
   All data has been reviewed.  Deferred to heart failure team.

## 2019-06-22 NOTE — Plan of Care (Signed)

## 2019-06-22 NOTE — Progress Notes (Addendum)
Advanced Heart Failure Rounding Note  PCP-Cardiologist: Peter Martinique, MD  AHF: Dr. Aundra Dubin   Subjective:    Admitted for acute anterior MI c/b cardiogenic shock and transient CHB (resolved). S/p DES to LAD. EF ~35-40%. Also w/ mechanical complication w/ infarct-related VSD in the mid-apical interventricular septum w/ small LV apical rupture communicating with the pericardial space, confirmed by TEE. Unfortunately felt to be a poor surgical candidate. Treating conservatively.   No events overnight. CP free. No dyspnea.   Remains on low dose dobutamine 1.5. Co-ox pending. MAPs in the 80s. No further CHB.   Family present at the bedside.    Objective:   Weight Range: 74.3 kg Body mass index is 25.66 kg/m.   Vital Signs:   Temp:  [98.1 F (36.7 C)-99.6 F (37.6 C)] 99.6 F (37.6 C) (12/15 0700) Pulse Rate:  [92-118] 98 (12/15 0700) Resp:  [12-32] 21 (12/15 0700) BP: (65-141)/(42-87) 114/66 (12/15 0700) SpO2:  [80 %-100 %] 95 % (12/15 0700) Weight:  [74.3 kg] 74.3 kg (12/15 0426) Last BM Date: 06/20/19  Weight change: Filed Weights   06/20/19 0340 06/21/19 0300 06/22/19 0426  Weight: 76.3 kg 78 kg 74.3 kg    Intake/Output:   Intake/Output Summary (Last 24 hours) at 06/22/2019 0711 Last data filed at 06/22/2019 0700 Gross per 24 hour  Intake 57 ml  Output 300 ml  Net -243 ml      Physical Exam    General:  Elderly WM Well appearing. No resp difficulty HEENT: Normal Neck: Supple. Elevated JVD. Carotids 2+ bilat; no bruits. No lymphadenopathy or thyromegaly appreciated. Cor: PMI nondisplaced. Regular rate & rhythm. No rubs, gallops or murmurs. Lungs: Clear Abdomen: Soft, nontender, nondistended. No hepatosplenomegaly. No bruits or masses. Good bowel sounds. Extremities: s/p left BKA No cyanosis, clubbing, rash, edema Neuro: Alert & orientedx3, cranial nerves grossly intact. moves all 4 extremities w/o difficulty. Affect pleasant   Telemetry   Sinus tach  low 100s. No further CHB.   EKG    No new EKG to review today  Labs    CBC Recent Labs    06/19/19 1335 06/21/19 0538  WBC  --  14.0*  HGB 10.2*  10.9* 9.6*  HCT 30.0*  32.0* 30.9*  MCV  --  85.4  PLT  --  250   Basic Metabolic Panel Recent Labs    06/20/19 0339 06/21/19 0538  NA 141 142  K 3.4* 3.5  CL 106 109  CO2 21* 21*  GLUCOSE 156* 131*  BUN 18 15  CREATININE 1.10 0.91  CALCIUM 8.6* 8.4*   Liver Function Tests No results for input(s): AST, ALT, ALKPHOS, BILITOT, PROT, ALBUMIN in the last 72 hours. No results for input(s): LIPASE, AMYLASE in the last 72 hours. Cardiac Enzymes No results for input(s): CKTOTAL, CKMB, CKMBINDEX, TROPONINI in the last 72 hours.  BNP: BNP (last 3 results) Recent Labs    06/19/19 0356  BNP 268.9*    ProBNP (last 3 results) No results for input(s): PROBNP in the last 8760 hours.   D-Dimer No results for input(s): DDIMER in the last 72 hours. Hemoglobin A1C Recent Labs    06/20/19 0339  HGBA1C 7.3*   Fasting Lipid Panel No results for input(s): CHOL, HDL, LDLCALC, TRIG, CHOLHDL, LDLDIRECT in the last 72 hours. Thyroid Function Tests No results for input(s): TSH, T4TOTAL, T3FREE, THYROIDAB in the last 72 hours.  Invalid input(s): FREET3  Other results:   Imaging    ECHOCARDIOGRAM LIMITED  Result Date: 06/21/2019   ECHOCARDIOGRAM LIMITED REPORT   Patient Name:   Rodney Sullivan Date of Exam: 06/21/2019 Medical Rec #:  [id number]      Height:       67.0 in Accession #:    [id number]     Weight:       172.0 lb Date of Birth:  01/01/1941      BSA:          1.90 m Patient Age:    78 years       BP:           108/65 mmHg Patient Gender: M              HR:           103 bpm. Exam Location:  Inpatient  Procedure: Limited Echo, Limited Color Doppler and Cardiac Doppler Indications:    pericardial effusion  History:        Patient has prior history of Echocardiogram examinations, most                 recent 06/20/2019.   Sonographer:    Johny Chess Referring Phys: [place]  1. Left ventricular ejection fraction, by visual estimation, is 45 to 50%. The left ventricle has mildly decreased function. There is moderately increased left ventricular hypertrophy.  2. LVEF overall is mildly depressed. The distal inferoseptal and apical walls are hypokinetic. There is a small region of thinning and color flow seen in image 20 that is suspicious for rupture through myocardium with flow into epicardial space. Tissue definition at apex different than other regions of LV. The basal inferoseptum thins but this study does not demonstrate communication here.  3. Global right ventricle has mildly reduced systolic function.The right ventricular size is normal. Right vetricular wall thickness was not assessed.  4. The RV apex is hypokinetic.  5. Large pericardial effusion.  6. At least moderate pericardial effusion is present with some areas of consolidation present. There does not appear to be tamponade by echo criteria. Clinical correlation indicated.  7. Mild mitral annular calcification.  8. The mitral valve is abnormal. No evidence of mitral valve regurgitation.  9. The tricuspid valve is grossly normal. Tricuspid valve regurgitation is trivial. 10. The aortic valve was not assessed. Aortic valve regurgitation is not visualized. 11. The pulmonic valve was not assessed. Pulmonic valve regurgitation not assessed. FINDINGS  Left Ventricle: Left ventricular ejection fraction, by visual estimation, is 45 to 50%. The left ventricular internal cavity size was the left ventricle is normal in size. There is moderately increased left ventricular wall thickness. LVEF overall is mildly depressed. The distal inferoseptal and apical walls are hypokinetic. There is a small region of thinning and color flow seen in image 20 that is suspicious for rupture through myocardium with flow into epicardial space. Tissue definition at apex  different than other regions of LV. The basal inferoseptum thins but this study does not demonstrate communication here. Right Ventricle: The right ventricular size is normal. No increase in right ventricular wall thickness. Global RV systolic function is has mildly reduced systolic function. The RV apex is hypokinetic. Left Atrium: Left atrial size was normal in size. Right Atrium: Right atrial size was normal in size. Right atrial pressure is estimated at 3 mmHg. Pericardium: A large pericardial effusion is present. At least moderate pericardial effusion is present with some areas of consolidation present. There does not appear to be tamponade by echo criteria. Clinical  correlation indicated. Mitral Valve: The mitral valve is abnormal. There is mild thickening of the mitral valve leaflet(s). Mild mitral annular calcification. No evidence of mitral valve regurgitation. Tricuspid Valve: The tricuspid valve is grossly normal. Tricuspid valve regurgitation is trivial. Aortic Valve: The aortic valve was not assessed. The aortic valve was not well visualized. Aortic valve regurgitation is not visualized. Pulmonic Valve: The pulmonic valve was not assessed. Pulmonic valve regurgitation not assessed. Aorta: The aortic root is normal in size and structure. The aortic root is normal in size and structure. Shunts: No atrial level shunt detected by color flow Doppler.  LEFT VENTRICLE          Normals PLAX 2D LVIDd:         4.60 cm  3.6 cm LVIDs:         3.00 cm  1.7 cm LV PW:         1.30 cm  1.4 cm LV IVS:        1.10 cm  1.3 cm LVOT diam:     1.70 cm  2.0 cm LV SV:         62 ml    79 ml LV SV Index:   32.20    45 ml/m2 LVOT Area:     2.27 cm 3.14 cm2  LEFT ATRIUM         Index LA diam:    3.60 cm 1.90 cm/m   AORTA                 Normals Ao Root diam: 3.10 cm 31 mm  SHUNTS Systemic Diam: 1.70 cm  Dorris Carnes MD Electronically signed by Dorris Carnes MD Signature Date/Time: 06/21/2019/6:07:02 PMThe mitral valve is abnormal.     Final      Medications:     Scheduled Medications: . allopurinol  100 mg Oral Daily  . aspirin EC  81 mg Oral Q supper  . atorvastatin  80 mg Oral q1800  . brimonidine  1 drop Both Eyes Q12H   And  . timolol  1 drop Both Eyes Q12H  . Chlorhexidine Gluconate Cloth  6 each Topical Daily  . cholecalciferol  2,000 Units Oral Daily  . ferrous sulfate  325 mg Oral Daily  . furosemide  40 mg Intravenous BID  . insulin aspart  0-5 Units Subcutaneous QHS  . insulin aspart  0-9 Units Subcutaneous TID WC  . latanoprost  1 drop Both Eyes QHS  . losartan  12.5 mg Oral Daily  . mesalamine  2,000 mg Oral BID  . multivitamin with minerals  1 tablet Oral Daily  . omega-3 acid ethyl esters  1 g Oral Daily  . pantoprazole  40 mg Oral Q1500  . predniSONE  22.5 mg Oral Q breakfast  . sodium chloride flush  3 mL Intravenous Q12H  . spironolactone  12.5 mg Oral Daily  . ticagrelor  90 mg Oral BID  . vitamin B-12  2,000 mcg Oral Daily    Infusions: . sodium chloride 10 mL/hr at 06/20/19 0700  . sodium chloride    . DOBUTamine 1.5 mcg/kg/min (06/22/19 0700)  . nitroGLYCERIN Stopped (06/19/19 0920)  . norepinephrine (LEVOPHED) Adult infusion Stopped (06/20/19 0209)    PRN Medications: sodium chloride, sodium chloride, acetaminophen, albuterol, ALPRAZolam, LORazepam, nitroGLYCERIN, ondansetron (ZOFRAN) IV, sodium chloride flush, traMADol, zolpidem    Patient Profile   78 y.o male with history of extensive PAD, type 2 diabetes, prior PE/DVT, and Crohns disease was admitted with  anterior MI. Emergent cath on 12/11 showed acutely occluded mLAD and CTO D1.  LAD was opened with 2 DES but there was no-reflow.  On 12/12, he developed complete heart block and temporary transvenous pacer was placed. RHC showed low output (CI 1.99 Fick) but PCWP 11. Initial co-ox 52% and started on dobutamine. Echo w/ apical and peri-apical severe akinesis, EF ~ 35%, normal RV function. Also w/ infarct-related VSD in the  mid-apical interventricular septum w/ small LV apical rupture communicating with the pericardial space, confirmed by TEE. Unfortunately felt to be a poor surgical candidate. Treating conservatively.   Assessment/Plan   1. CAD: Acute anteroseptal MI, somewhat delayed presentation.  Cath with acutely occluded mLAD and CTO D1.  DES x 2 to LAD, but no-reflow. No s/s angina.  - Continue ASA 81 and ticagrelor 90 bid.  - Atorvastatin 80 mg daily.  - no  blocker w/ CHB and low output 2. Acute systolic CHF: Ischemic cardiomyopathy.  EF ~ 35% with apical/peri-apical severe hypokinesis to akinesis. RHC showed low output (CI 1.99 Fick). Started on dobutamine 12/13. Initial co-ox, 52%. Remains on low dose 1.5 mcg.  Co-ox pending this morning. MAPs 80s.    - Continue dobutamine at 1.5 mcg - Continue losartan 12.5 mg daily. - Continue spironolactone 12.5 daily.  - No beta blockade yet with recent CHB/low output.  - If ends up needing PPM, would need CRT device.  3. VSD + Small Apical Rupture: mechanical complication of acute MI. Unfortunately not a surgical candidate. Treating conservatively. No clinical signs of tamponade. Poor prognosis.  3. Diabetes: pioglitazone discontinued due to CHF.  4. PAD: s/p left BKA, nonhealing ulcer on right.  5. Crohns disease: Has been on chronic prednisone, continued here.  Higher risk of mechanical complication of MI with prednisone.  6. Complete heart block: Complication of anterior MI with no-reflow. No recurrent HB. Normal conduction.   Length of Stay: [address] PA-C  06/22/2019, 7:11 AM  Advanced Heart Failure Team Pager [phone] (M-F; 7a - 4p)  Please contact [facility] for night-coverage after hours (4p -7a ) and weekends on [email or web address]  Patient seen with PA, agree with the above note.   Stable this morning, no chest pain or dyspnea.  CVP 9.  No labs.    General: NAD Neck: JVP 8-9 cm, no thyromegaly or thyroid nodule.  Lungs: Clear to  auscultation bilaterally with normal respiratory effort. CV: Nondisplaced PMI.  Heart regular S1/S2, no G5/O0, 2/6 systolic murmur along sternal border.  No peripheral edema.  Abdomen: Soft, nontender, no hepatosplenomegaly, no distention.  Skin: Intact without lesions or rashes.  Neurologic: Alert and oriented x 3.  Psych: Normal affect. Extremities: No clubbing or cyanosis.  HEENT: Normal.   Patient has EF around 40% but has MI-related VSD as well as small apical rupture with pericardial effusion.   - Not surgical candidate, will have to manage medically.  - If apical perforation walls off, could consider attempt at percutaneous treatment of VSD with closure device.  - Patient and family understand significant mortality risk, he is DNR.   Rhythm remains NSR, will stop dobutamine this morning.  If conduction remains normal, can remove temporary wire.   Continue spironolactone and losartan at low doses, start Lasix 40 mg daily.    PT eval after pacer out.   CRITICAL CARE Performed by: Loralie Champagne  Total critical care time: 35 minutes  Critical care time was exclusive of separately billable procedures and treating other patients.  Critical care was necessary to treat or prevent imminent or life-threatening deterioration.  Critical care was time spent personally by me on the following activities: development of treatment plan with patient and/or surrogate as well as nursing, discussions with consultants, evaluation of patient's response to treatment, examination of patient, obtaining history from patient or surrogate, ordering and performing treatments and interventions, ordering and review of laboratory studies, ordering and review of radiographic studies, pulse oximetry and re-evaluation of patient's condition.  Loralie Champagne 06/22/2019 8:30 AM

## 2019-06-22 NOTE — Progress Notes (Signed)
    Interval f/u after dobutamine discontinuation. Rhythm stable. NSR. HR 80s. BP stable off dobutamine. Pt w/o active cardiac symptoms. Family present at bedside. Discussed w/ Dr. Aundra Dubin. Will remove temp pacer wire. Can transfer to progressive care unit post temp wire removal. Transfer to [facility]. RN updated on plan.   Genell Thede [employer]

## 2019-06-22 NOTE — TOC Initial Note (Signed)
Transition of Care [facility]) - Initial/Assessment Note    Patient Details  Name: Rodney Sullivan MRN: [id number] Date of Birth: [birth year]  [facility] CM/SW Contact:    Bethena Roys, RN Phone Number: 06/22/2019, 1:32 PM  Clinical Narrative: Patient presented for [place]-Plan to transfer to progressive-Case manager will continue to follow for transition of care needs.                    Expected Discharge Plan: [place] Barriers to Discharge: Continued Medical Work up    Expected Discharge Plan and Services Expected Discharge Plan: [place]   Discharge Planning Services: CM Consult      Activities of Daily Living Home Assistive Devices/Equipment: Wheelchair, Eyeglasses, Dentures (specify type) ADL Screening (condition at time of admission) Patient's cognitive ability adequate to safely complete daily activities?: Yes Is the patient deaf or have difficulty hearing?: No Does the patient have difficulty seeing, even when wearing glasses/contacts?: No Does the patient have difficulty concentrating, remembering, or making decisions?: No Patient able to express need for assistance with ADLs?: Yes Does the patient have difficulty dressing or bathing?: No Independently performs ADLs?: Yes (appropriate for developmental age) Does the patient have difficulty walking or climbing stairs?: Yes(BKA) Weakness of Legs: Both Weakness of Arms/Hands: Both  Admission diagnosis:  ST elevation myocardial infarction (STEMI), unspecified artery ([place]) [I21.3] Chest pain, unspecified type [R07.9] STEMI involving left anterior descending coronary artery ([place]) [I21.02] STEMI (ST elevation myocardial infarction) ([place]) [I21.3] Patient Active Problem List   Diagnosis Date Noted  . Acute systolic heart failure ([place]) 06/19/2019  . Right bundle branch block 06/19/2019  . Ischemia of left BKA site ([place]) 06/19/2019  . Heart block AV complete  ([place])   . STEMI (ST elevation myocardial infarction) ([place]) 06/18/2019  . STEMI involving left anterior descending coronary artery ([place]) 06/18/2019  . Bacteremia due to Escherichia coli 04/27/2019  . Gangrene of left foot ([place]) 03/01/2019  . Sepsis ([place]) 02/15/2019  . Anemia 02/15/2019  . Cellulitis and abscess of toe of left foot 02/15/2019  . Peripheral arterial disease ([place]) 02/15/2019  . DM (diabetes mellitus), type 2 with peripheral vascular complications ([place]) 04/23/5101  . Gout 02/15/2019  . Septic shock ([place]) 12/14/2013  . Pulmonary embolism ([place]) 12/14/2013  . Crohn disease ([place]) 12/14/2013   PCP:  Egbert Garibaldi, PA-C Pharmacy:   [facility], [address] [place] [address] Phone: [phone] Fax: [phone]     Social Determinants of Health (SDOH) Interventions    Readmission Risk Interventions Readmission Risk Prevention Plan 03/18/2019  Transportation Screening Complete  PCP or Specialist Appt within 3-5 Days Complete  HRI or [place] Complete  Social Work Consult for [place] Planning/Counseling Complete  Palliative Care Screening Not Applicable  Medication Review [employer]) Complete  Some recent data might be hidden

## 2019-06-23 ENCOUNTER — Inpatient Hospital Stay (HOSPITAL_COMMUNITY): Payer: Medicare HMO

## 2019-06-23 DIAGNOSIS — I5021 Acute systolic (congestive) heart failure: Secondary | ICD-10-CM

## 2019-06-23 DIAGNOSIS — Z515 Encounter for palliative care: Secondary | ICD-10-CM

## 2019-06-23 DIAGNOSIS — Z66 Do not resuscitate: Secondary | ICD-10-CM

## 2019-06-23 LAB — CBC WITH DIFFERENTIAL/PLATELET
Abs Immature Granulocytes: 0.05 10*3/uL (ref 0.00–0.07)
Basophils Absolute: 0 10*3/uL (ref 0.0–0.1)
Basophils Relative: 0 %
Eosinophils Absolute: 0.1 10*3/uL (ref 0.0–0.5)
Eosinophils Relative: 1 %
HCT: 33.8 % — ABNORMAL LOW (ref 39.0–52.0)
Hemoglobin: 10.4 g/dL — ABNORMAL LOW (ref 13.0–17.0)
Immature Granulocytes: 0 %
Lymphocytes Relative: 22 %
Lymphs Abs: 2.5 10*3/uL (ref 0.7–4.0)
MCH: 26.3 pg (ref 26.0–34.0)
MCHC: 30.8 g/dL (ref 30.0–36.0)
MCV: 85.6 fL (ref 80.0–100.0)
Monocytes Absolute: 1.5 10*3/uL — ABNORMAL HIGH (ref 0.1–1.0)
Monocytes Relative: 14 %
Neutro Abs: 7 10*3/uL (ref 1.7–7.7)
Neutrophils Relative %: 63 %
Platelets: 217 10*3/uL (ref 150–400)
RBC: 3.95 MIL/uL — ABNORMAL LOW (ref 4.22–5.81)
RDW: 16.9 % — ABNORMAL HIGH (ref 11.5–15.5)
WBC: 11.1 10*3/uL — ABNORMAL HIGH (ref 4.0–10.5)
nRBC: 0 % (ref 0.0–0.2)

## 2019-06-23 LAB — COOXEMETRY PANEL
Carboxyhemoglobin: 0.3 % — ABNORMAL LOW (ref 0.5–1.5)
Methemoglobin: 2.7 % — ABNORMAL HIGH (ref 0.0–1.5)
O2 Saturation: 64.7 %
Total hemoglobin: 10.7 g/dL — ABNORMAL LOW (ref 12.0–16.0)

## 2019-06-23 LAB — GLUCOSE, CAPILLARY
Glucose-Capillary: 125 mg/dL — ABNORMAL HIGH (ref 70–99)
Glucose-Capillary: 258 mg/dL — ABNORMAL HIGH (ref 70–99)
Glucose-Capillary: 231 mg/dL — ABNORMAL HIGH (ref 70–99)

## 2019-06-23 LAB — BASIC METABOLIC PANEL
Anion gap: 11 (ref 5–15)
BUN: 14 mg/dL (ref 8–23)
CO2: 25 mmol/L (ref 22–32)
Calcium: 8.4 mg/dL — ABNORMAL LOW (ref 8.9–10.3)
Chloride: 106 mmol/L (ref 98–111)
Creatinine, Ser: 0.91 mg/dL (ref 0.61–1.24)
GFR calc Af Amer: >60 mL/min (ref >60)
GFR calc non Af Amer: >60 mL/min (ref >60)
Glucose, Bld: 127 mg/dL — ABNORMAL HIGH (ref 70–99)
Potassium: 3.4 mmol/L — ABNORMAL LOW (ref 3.5–5.1)
Sodium: 142 mmol/L (ref 135–145)

## 2019-06-23 LAB — ECHOCARDIOGRAM LIMITED: Weight: 2472.68 oz

## 2019-06-23 MED ORDER — SODIUM CHLORIDE 0.9% FLUSH: 10.0000 mL | INTRAVENOUS | Status: DC | PRN

## 2019-06-23 MED ORDER — ROSUVASTATIN CALCIUM 20 MG PO TABS
40.0000 mg | ORAL_TABLET | Freq: Every day | ORAL | Status: DC
  Administered 2019-06-23 – 2019-06-24 (×2): 40 mg via ORAL
  Filled 2019-06-23 (×2): qty 2

## 2019-06-23 MED ORDER — CARVEDILOL 3.125 MG PO TABS
3.1250 mg | ORAL_TABLET | Freq: Two times a day (BID) | ORAL | Status: DC
  Administered 2019-06-23 – 2019-06-24 (×3): 3.125 mg via ORAL
  Filled 2019-06-23 (×4): qty 1

## 2019-06-23 MED ORDER — POTASSIUM CHLORIDE CRYS ER 20 MEQ PO TBCR
40.0000 meq | EXTENDED_RELEASE_TABLET | Freq: Three times a day (TID) | ORAL | Status: AC
  Administered 2019-06-23 (×3): 40 meq via ORAL
  Filled 2019-06-23 (×3): qty 2

## 2019-06-23 MED ORDER — SODIUM CHLORIDE 0.9% FLUSH
10.0000 mL | Freq: Two times a day (BID) | INTRAVENOUS | Status: DC
  Administered 2019-06-23 – 2019-06-24 (×4): 10 mL

## 2019-06-23 NOTE — Consult Note (Signed)
[facility] Nurse wound follow up Patient receiving care in [place].  Assisted with wound care by primary RN April.  Spouse and daughter at bedside. Wound type: healing surgical left bka Measurement: Depth of hole now 0.2cm Wound bed: 100% pink Drainage (amount, consistency, odor) none Periwound: intact Dressing procedure/placement/frequency: Cut a narrow strip of Xeroform gauze and place a small section of it in the tiny hole at the bottom of the wound. Place Xeroform gauze over the remaining superficial wound area.  Place a small piece of dry gauze over the all this, tape in place.  Monitor the wound area(s) for worsening of condition such as: Signs/symptoms of infection,  Increase in size,  Development of or worsening of odor, Development of pain, or increased pain at the affected locations.  Notify the medical team if any of these develop.  Thank you for the consult.  Discussed plan of care with the patient and bedside nurse.  [place] nurse will not follow at this time.  Please re-consult the [place] team if needed.  Val Riles, RN, MSN, CWOCN, CNS-BC, pager [phone]

## 2019-06-23 NOTE — Plan of Care (Signed)
  Problem: Education: Goal: Knowledge of General Education information will improve Description: Including pain rating scale, medication(s)/side effects and non-pharmacologic comfort measures Outcome: Progressing   Problem: Health Behavior/Discharge Planning: Goal: Ability to manage health-related needs will improve Outcome: Progressing   Problem: Clinical Measurements: Goal: Ability to maintain clinical measurements within normal limits will improve Outcome: Progressing Goal: Will remain free from infection Outcome: Progressing Goal: Diagnostic test results will improve Outcome: Progressing Goal: Respiratory complications will improve Outcome: Progressing Goal: Cardiovascular complication will be avoided Outcome: Progressing   Problem: Activity: Goal: Risk for activity intolerance will decrease Outcome: Progressing   Problem: Nutrition: Goal: Adequate nutrition will be maintained Outcome: Progressing   Problem: Coping: Goal: Level of anxiety will decrease Outcome: Progressing   Problem: Elimination: Goal: Will not experience complications related to bowel motility Outcome: Progressing Goal: Will not experience complications related to urinary retention Outcome: Progressing   Problem: Pain Managment: Goal: General experience of comfort will improve Outcome: Progressing   Problem: Safety: Goal: Ability to remain free from injury will improve Outcome: Progressing   Problem: Skin Integrity: Goal: Risk for impaired skin integrity will decrease Outcome: Progressing   Problem: Education: Goal: Understanding of CV disease, CV risk reduction, and recovery process will improve Outcome: Progressing Goal: Individualized Educational Video(s) Outcome: Progressing   Problem: Activity: Goal: Ability to return to baseline activity level will improve Outcome: Progressing   Problem: Health Behavior/Discharge Planning: Goal: Ability to safely manage health-related needs  after discharge will improve Outcome: Progressing

## 2019-06-23 NOTE — Progress Notes (Signed)
Daily Progress Note   Patient Name: Rodney Sullivan       Date: 06/23/2019 DOB: 04-26-1941  Age: 78 y.o. MRN#: [id number] Attending Physician: Martinique, Peter M, MD Primary Care Physician: Emelia Loron Admit Date: 06/18/2019  Reason for Consultation/Follow-up: Establishing goals of care  Subjective: I met today with Rodney Sullivan, his wife, and his children.  We reviewed his clinical course this hospitalization including significant concern for risk of acute decompensation.  While he is hopeful for stabilization of function, he reports being under God's care and is at peace with whatever happens moving forward.    Discussed plan for repeat echo today and waiting to see results of it regarding apical perforation.  Reviewed possible consideration of VSD closure device in a couple of weeks if apical perforation does wall off.  For now, family thankful for care he has received and express understanding his situation and severity of his condition.  They would like to know results of repeat echo as soon as possible.     Length of Stay: 5  Current Medications: Scheduled Meds:  . allopurinol  100 mg Oral Daily  . aspirin EC  81 mg Oral Q supper  . brimonidine  1 drop Both Eyes Q12H   And  . timolol  1 drop Both Eyes Q12H  . carvedilol  3.125 mg Oral BID WC  . Chlorhexidine Gluconate Cloth  6 each Topical Daily  . cholecalciferol  2,000 Units Oral Daily  . ferrous sulfate  325 mg Oral Daily  . furosemide  40 mg Oral Daily  . insulin aspart  0-5 Units Subcutaneous QHS  . insulin aspart  0-9 Units Subcutaneous TID WC  . latanoprost  1 drop Both Eyes QHS  . losartan  12.5 mg Oral Daily  . mesalamine  2,000 mg Oral BID  . multivitamin with minerals  1 tablet Oral Daily  . omega-3 acid  ethyl esters  1 g Oral Daily  . pantoprazole  40 mg Oral Q1500  . potassium chloride  40 mEq Oral TID  . predniSONE  22.5 mg Oral Q breakfast  . rosuvastatin  40 mg Oral q1800  . sodium chloride flush  10-40 mL Intracatheter Q12H  . sodium chloride flush  3 mL Intravenous Q12H  . spironolactone  12.5 mg Oral Daily  .  ticagrelor  90 mg Oral BID  . vitamin B-12  2,000 mcg Oral Daily    Continuous Infusions: . sodium chloride 10 mL/hr at 06/22/19 0800  . sodium chloride      PRN Meds: sodium chloride, sodium chloride, acetaminophen, albuterol, ALPRAZolam, LORazepam, nitroGLYCERIN, ondansetron (ZOFRAN) IV, sodium chloride flush, sodium chloride flush, traMADol, zolpidem  Physical Exam         General: Alert, awake, in no acute distress.  HEENT: No bruits, no goiter, no JVD Heart: Regular rate and rhythm. Lungs: Good air movement Abdomen: Nondistended Ext: Left BKA, No significant edema Skin: Warm and dry Neuro: Grossly intact, nonfocal.   Vital Signs: BP 96/63 (BP Location: Right Arm)   Pulse 94   Temp 97.7 F (36.5 C) (Oral)   Resp 15   Wt 70.1 kg   SpO2 92%   BMI 24.20 kg/m  SpO2: SpO2: 92 % O2 Device: O2 Device: Room Air O2 Flow Rate: O2 Flow Rate (L/min): 3 L/min  Intake/output summary:   Intake/Output Summary (Last 24 hours) at 06/23/2019 1636 Last data filed at 06/23/2019 1342 Gross per 24 hour  Intake 240 ml  Output 400 ml  Net -160 ml   LBM: Last BM Date: 06/23/19 Baseline Weight: Weight: 77 kg Most recent weight: Weight: 70.1 kg       Palliative Assessment/Data:      Patient Active Problem List   Diagnosis Date Noted  . Acute systolic heart failure ([place]) 06/19/2019  . Right bundle branch block 06/19/2019  . Ischemia of left BKA site ([place]) 06/19/2019  . Heart block AV complete ([place])   . STEMI (ST elevation myocardial infarction) ([place]) 06/18/2019  . STEMI involving left anterior descending coronary artery ([place]) 06/18/2019  . Bacteremia due  to Escherichia coli 04/27/2019  . Gangrene of left foot ([place]) 03/01/2019  . Sepsis ([place]) 02/15/2019  . Anemia 02/15/2019  . Cellulitis and abscess of toe of left foot 02/15/2019  . Peripheral arterial disease ([place]) 02/15/2019  . DM (diabetes mellitus), type 2 with peripheral vascular complications ([place]) 24/03/7352  . Gout 02/15/2019  . Septic shock ([place]) 12/14/2013  . Pulmonary embolism ([place]) 12/14/2013  . Crohn disease ([place]) 12/14/2013    Palliative Care Assessment & Plan   Patient Profile: 78 yo male with complicated course including anterior MI complicated by cardiogenic shock and transient complete heart block that is now resolved.  He is status post drug-eluting stents to LAD without restoration of flow.  Additionally, he has mechanical complication with an infarct-related VSD and small LV apical rupture communicating with pericardial spaces confirmed by TEE.  He is a poor surgical candidate and is being treated conservatively.  Assessment: Patient Active Problem List   Diagnosis Date Noted  . Acute systolic heart failure ([place]) 06/19/2019  . Right bundle branch block 06/19/2019  . Ischemia of left BKA site ([place]) 06/19/2019  . Heart block AV complete ([place])   . STEMI (ST elevation myocardial infarction) ([place]) 06/18/2019  . STEMI involving left anterior descending coronary artery ([place]) 06/18/2019  . Bacteremia due to Escherichia coli 04/27/2019  . Gangrene of left foot ([place]) 03/01/2019  . Sepsis ([place]) 02/15/2019  . Anemia 02/15/2019  . Cellulitis and abscess of toe of left foot 02/15/2019  . Peripheral arterial disease ([place]) 02/15/2019  . DM (diabetes mellitus), type 2 with peripheral vascular complications ([place]) 29/92/4268  . Gout 02/15/2019  . Septic shock ([place]) 12/14/2013  . Pulmonary embolism ([place]) 12/14/2013  . Crohn disease ([place]) 12/14/2013  Recommendations/Plan: - DNR/DNI - Continue conservative medical management. - Patient and family are hopeful for  stabilization, but are well aware of risk of acute decline and that this may be terminal event.  Continue to medically maximize and await results of echo this afternoon to reassess apical rupture. - Discussed in conjunction with charge RN regarding visitation restrictions in light of COVID 19 pandemic.  Will continue to work to support family within constraints of visitation policy. - Please call if there are immediate needs with which we can be of further assistance.   Code Status:    Code Status Orders  (From admission, onward)         Start     Ordered   06/21/19 1804  Do not attempt resuscitation (DNR)  Continuous    Question Answer Comment  In the event of cardiac or respiratory ARREST Do not call a "code blue"   In the event of cardiac or respiratory ARREST Do not perform Intubation, CPR, defibrillation or ACLS   In the event of cardiac or respiratory ARREST Use medication by any route, position, wound care, and other measures to relive pain and suffering. May use oxygen, suction and manual treatment of airway obstruction as needed for comfort.      06/21/19 1804         Prognosis:   Unable to determine  Discharge Planning:  To Be Determined  Care plan was discussed with family, HF team, and RN  Thank you for allowing the Palliative Medicine Team to assist in the care of this patient.   Time In: 1040 Time Out: 1125 Total Time 45 Prolonged Time Billed No      Greater than 50%  of this time was spent counseling and coordinating care related to the above assessment and plan.  Micheline Rough, MD  Please contact Palliative Medicine Team phone at [phone] for questions and concerns.

## 2019-06-23 NOTE — Consult Note (Signed)
Palliative Care Consult Note  Reason for consult: Goals of care in light of MI with infarct related VSD  Palliative care consult received.  Chart reviewed including personal review of pertinent labs and imaging.  Briefly, [name] is a very pleasant 78 year old male admitted for anterior MI complicated by cardiogenic shock and transient complete heart block that is now resolved.  He is status post drug-eluting stents to LAD without restoration of flow.  Additionally, he has mechanical complication with an infarct-related VSD and small LV apical rupture communicating with pericardial spaces confirmed by TEE.  He is a poor surgical candidate and is being treated conservatively.  I met today with [name], his wife, his son, and daughter-in-law at the bedside.  We reviewed his clinical course this hospitalization.  He reports that the doctors have been doing a good job explaining things to him, however, he reports being a little bit confused on current care plan as he had been told that this was likely going to be fatal outcome in a short period of time.  Today, however, he notes that he was told that a potential procedure may be beneficial but would likely not be able to be completed for several weeks.  Dr. Marigene Ehlers did note that if his apical perforation walled off, could consider attempted percutaneous treatment of VSD with closure device.  He reports understanding the severity of his condition and does not want heroic interventions in the event of cardiac or respiratory arrest.  He would be interested in any potential interventions that would add time in quality to his life.  He currently denies any symptoms and reports that he is feeling "fine right now".  Family is hopeful for further clarification regarding potential procedure and if this would add time and or quality to his life.  I will reach out to heart failure team tomorrow to see if I can gain further understanding of risk and benefit  of this as well as likelihood it will be a feasible option.  -DNR/DNI -Continue conservative medical management. -Plan for follow-up family meeting tomorrow morning around 1030. -Please call if there are immediate needs with which we can be of further assistance.  Total time: 50 minutes Greater than 50%  of this time was spent counseling and coordinating care related to the above assessment and plan.  Micheline Rough, MD [facility] Team [phone]

## 2019-06-23 NOTE — Evaluation (Signed)
Physical Therapy Evaluation Patient Details Name: Rodney Sullivan MRN: [id number] DOB: 12/23/1940 Today's Date: 06/23/2019   History of Present Illness  78 y.o. male Admitted for acute anterior MI c/b cardiogenic shock and transient CHB (resolved). S/p DES to LAD. EF ~35-40%. Also w/ mechanical complication w/ infarct-related VSD in the mid-apical interventricular septum w/ small LV apical rupture communicating with the pericardial space, confirmed by TEE. PMH significant for  L BKA, chrons disease, cancer, DM.   Clinical Impression  Pt presents near functional baseline at this time for bed mobility and transfers, not requiring physical assistance to perform ans tolerating well without symptoms. PT defers wheelchair mobility at this time due to pt's medical complexity, wanting to slowly progress mobility at this time. PT anticipates pt will mobilize well in wheelchair due to sufficient strength, ROM, and balance, with only concerns being for cardiopulmonary function. PT will attempt to assess wheelchair mobility at next session. Pt will benefit from continued HHPT services upon discharge, no current DME needs.    Follow Up Recommendations Home health PT    Equipment Recommendations  None recommended by PT(pt owns necessary DME)    Recommendations for Other Services       Precautions / Restrictions Precautions Precautions: Fall Restrictions Weight Bearing Restrictions: No      Mobility  Bed Mobility Overal bed mobility: Modified Independent             General bed mobility comments: HOB elevated and increased time  Transfers Overall transfer level: Needs assistance Equipment used: None Transfers: Stand Pivot Transfers   Stand pivot transfers: Supervision          Ambulation/Gait                Stairs            Wheelchair Mobility    Modified Rankin (Stroke Patients Only)       Balance Overall balance assessment: Mild deficits observed, not  formally tested(modI for sitting balance, standing balance not assessed)                                           Pertinent Vitals/Pain Pain Assessment: No/denies pain    Home Living Family/patient expects to be discharged to:: Private residence Living Arrangements: Spouse/significant other Available Help at Discharge: Family;Available 24 hours/day Type of Home: House Home Access: Ramped entrance     Home Layout: One level Home Equipment: Walker - 2 wheels;Walker - 4 wheels;Wheelchair - manual;Grab bars - toilet;Hand held shower head      Prior Function Level of Independence: Needs assistance   Gait / Transfers Assistance Needed: supervision for transfers, assistance with manual wheelchair for community distances           Hand Dominance   Dominant Hand: Right    Extremity/Trunk Assessment   Upper Extremity Assessment Upper Extremity Assessment: Overall WFL for tasks assessed    Lower Extremity Assessment Lower Extremity Assessment: LLE deficits/detail LLE Deficits / Details: L BKA, otherwise WFL    Cervical / Trunk Assessment Cervical / Trunk Assessment: Normal  Communication   Communication: No difficulties  Cognition Arousal/Alertness: Awake/alert Behavior During Therapy: WFL for tasks assessed/performed Overall Cognitive Status: Within Functional Limits for tasks assessed  General Comments General comments (skin integrity, edema, etc.): Pt with short run of Vtach picked up on monitor once sitting edge of bed, pt asymptomatic.     Exercises     Assessment/Plan    PT Assessment Patient needs continued PT services  PT Problem List Decreased activity tolerance;Decreased balance;Decreased mobility;Cardiopulmonary status limiting activity       PT Treatment Interventions DME instruction;Gait training;Functional mobility training;Therapeutic activities;Therapeutic exercise;Balance  training;Neuromuscular re-education;Patient/family education;Wheelchair mobility training    PT Goals (Current goals can be found in the Care Plan section)  Acute Rehab PT Goals Patient Stated Goal: To return to prior level of function and eventually ambulate PT Goal Formulation: With patient Time For Goal Achievement: 07/07/19 Potential to Achieve Goals: Good Additional Goals Additional Goal #1: Pt will mobilize in manual wheelchair utilizing [place] for 50' with supervision.    Frequency Min 3X/week   Barriers to discharge        Co-evaluation               AM-PAC PT "6 Clicks" Mobility  Outcome Measure Help needed turning from your back to your side while in a flat bed without using bedrails?: None Help needed moving from lying on your back to sitting on the side of a flat bed without using bedrails?: None Help needed moving to and from a bed to a chair (including a wheelchair)?: None Help needed standing up from a chair using your arms (e.g., wheelchair or bedside chair)?: A Lot Help needed to walk in hospital room?: A Lot Help needed climbing 3-5 steps with a railing? : Total 6 Click Score: 17    End of Session Equipment Utilized During Treatment: (none) Activity Tolerance: Patient tolerated treatment well Patient left: in chair;with call bell/phone within reach;with chair alarm set;with family/visitor present Nurse Communication: Mobility status PT Visit Diagnosis: Muscle weakness (generalized) (M62.81)    Time: 1660-6004 PT Time Calculation (min) (ACUTE ONLY): 24 min   Charges:   PT Evaluation $PT Eval Moderate Complexity: 1 Mod          Zenaida Niece, PT, DPT Acute Rehabilitation Pager: [phone]   Zenaida Niece 06/23/2019, 3:15 PM

## 2019-06-23 NOTE — Plan of Care (Signed)

## 2019-06-23 NOTE — Progress Notes (Addendum)
Advanced Heart Failure Rounding Note  PCP-Cardiologist: Peter Martinique, MD  AHF: Dr. Aundra Dubin   Subjective:    Admitted for acute anterior MI c/b cardiogenic shock and transient CHB (resolved). S/p DES to LAD. EF ~35-40%. Also w/ mechanical complication w/ infarct-related VSD in the mid-apical interventricular septum w/ small LV apical rupture communicating with the pericardial space, confirmed by TEE. Unfortunately felt to be a poor surgical candidate. Treating conservatively.   Dobutamine discontinued yesterday. Co-ox stable at 65%. No further CHB. Temp pacer wire removed yesterday. Rhythm remains stable on tele. NSR.   Feels ok currently. No CP or dyspnea. BP soft in mid 90s.   Palliative care following. He is DNR/DNI. F/u family meeting scheduled today.    Objective:   Weight Range: 70.1 kg Body mass index is 24.2 kg/m.   Vital Signs:   Temp:  [97.7 F (36.5 C)-98.3 F (36.8 C)] 97.7 F (36.5 C) (12/16 0828) Pulse Rate:  [88-108] 88 (12/16 0321) Resp:  [13-28] 17 (12/16 0828) BP: (96-125)/(59-97) 96/63 (12/16 0828) SpO2:  [91 %-100 %] 98 % (12/16 0828) Weight:  [70.1 kg] 70.1 kg (12/16 0556) Last BM Date: 06/22/19  Weight change: Filed Weights   06/21/19 0300 06/22/19 0426 06/23/19 0556  Weight: 78 kg 74.3 kg 70.1 kg    Intake/Output:   Intake/Output Summary (Last 24 hours) at 06/23/2019 0850 Last data filed at 06/23/2019 0800 Gross per 24 hour  Intake 240.59 ml  Output 700 ml  Net -459.41 ml      Physical Exam   CVP 5-6 General:  Elderly WM Well appearing. No resp difficulty HEENT: Normal Neck: Supple. no JVD. Carotids 2+ bilat; no bruits. No lymphadenopathy or thyromegaly appreciated. Cor: PMI nondisplaced. Regular rate & rhythm. No rubs, gallops or murmurs. Lungs: Clear Abdomen: Soft, nontender, nondistended. No hepatosplenomegaly. No bruits or masses. Good bowel sounds. Extremities: s/p left BKA No cyanosis, clubbing, rash, edema Neuro: Alert &  orientedx3, cranial nerves grossly intact. moves all 4 extremities w/o difficulty. Affect pleasant   Telemetry   NSR 80s. No further CHB.   EKG    No new EKG to review today  Labs    CBC Recent Labs    06/22/19 0823 06/23/19 0500  WBC 11.4* 11.1*  NEUTROABS  --  7.0  HGB 9.8* 10.4*  HCT 31.9* 33.8*  MCV 85.3 85.6  PLT 204 456   Basic Metabolic Panel Recent Labs    06/22/19 0823 06/23/19 0500  NA 142 142  K 3.3* 3.4*  CL 110 106  CO2 22 25  GLUCOSE 155* 127*  BUN 15 14  CREATININE 0.98 0.91  CALCIUM 7.9* 8.4*   Liver Function Tests No results for input(s): AST, ALT, ALKPHOS, BILITOT, PROT, ALBUMIN in the last 72 hours. No results for input(s): LIPASE, AMYLASE in the last 72 hours. Cardiac Enzymes No results for input(s): CKTOTAL, CKMB, CKMBINDEX, TROPONINI in the last 72 hours.  BNP: BNP (last 3 results) Recent Labs    06/19/19 0356  BNP 268.9*    ProBNP (last 3 results) No results for input(s): PROBNP in the last 8760 hours.   D-Dimer No results for input(s): DDIMER in the last 72 hours. Hemoglobin A1C No results for input(s): HGBA1C in the last 72 hours. Fasting Lipid Panel No results for input(s): CHOL, HDL, LDLCALC, TRIG, CHOLHDL, LDLDIRECT in the last 72 hours. Thyroid Function Tests No results for input(s): TSH, T4TOTAL, T3FREE, THYROIDAB in the last 72 hours.  Invalid input(s): FREET3  Other  results:   Imaging    No results found.   Medications:     Scheduled Medications: . allopurinol  100 mg Oral Daily  . aspirin EC  81 mg Oral Q supper  . atorvastatin  80 mg Oral q1800  . brimonidine  1 drop Both Eyes Q12H   And  . timolol  1 drop Both Eyes Q12H  . Chlorhexidine Gluconate Cloth  6 each Topical Daily  . cholecalciferol  2,000 Units Oral Daily  . ferrous sulfate  325 mg Oral Daily  . furosemide  40 mg Oral Daily  . insulin aspart  0-5 Units Subcutaneous QHS  . insulin aspart  0-9 Units Subcutaneous TID WC  .  latanoprost  1 drop Both Eyes QHS  . losartan  12.5 mg Oral Daily  . mesalamine  2,000 mg Oral BID  . multivitamin with minerals  1 tablet Oral Daily  . omega-3 acid ethyl esters  1 g Oral Daily  . pantoprazole  40 mg Oral Q1500  . potassium chloride  40 mEq Oral TID  . predniSONE  22.5 mg Oral Q breakfast  . sodium chloride flush  3 mL Intravenous Q12H  . spironolactone  12.5 mg Oral Daily  . ticagrelor  90 mg Oral BID  . vitamin B-12  2,000 mcg Oral Daily    Infusions: . sodium chloride 10 mL/hr at 06/22/19 0800  . sodium chloride    . nitroGLYCERIN Stopped (06/19/19 0920)  . norepinephrine (LEVOPHED) Adult infusion Stopped (06/20/19 0209)    PRN Medications: sodium chloride, sodium chloride, acetaminophen, albuterol, ALPRAZolam, LORazepam, nitroGLYCERIN, ondansetron (ZOFRAN) IV, sodium chloride flush, traMADol, zolpidem    Patient Profile   78 y.o male with history of extensive PAD, type 2 diabetes, prior PE/DVT, and Crohns disease was admitted with anterior MI. Emergent cath on 12/11 showed acutely occluded mLAD and CTO D1.  LAD was opened with 2 DES but there was no-reflow.  On 12/12, he developed complete heart block and temporary transvenous pacer was placed. RHC showed low output (CI 1.99 Fick) but PCWP 11. Initial co-ox 52% and started on dobutamine. Echo w/ apical and peri-apical severe akinesis, EF ~ 35%, normal RV function. Also w/ infarct-related VSD in the mid-apical interventricular septum w/ small LV apical rupture communicating with the pericardial space, confirmed by TEE. Unfortunately felt to be a poor surgical candidate. Treating conservatively.   Assessment/Plan   1. CAD: Acute anteroseptal MI, somewhat delayed presentation.  Cath with acutely occluded mLAD and CTO D1.  DES x 2 to LAD, but no-reflow. No s/s angina.  - Continue ASA 81 and ticagrelor 90 bid.  - Atorvastatin 80 mg daily.  - CHB resolved. Co-ox stable off dobutamine. May try trial of low dose   blocker 2. Acute systolic CHF: Ischemic cardiomyopathy.  EF ~ 35% with apical/peri-apical severe hypokinesis to akinesis. RHC showed low output (CI 1.99 Fick). Started on dobutamine 12/13. Initial co-ox, 52%. Dobutamine discontinued 12/15. Co-ox 65% this morning. MAPs in the 12s.    - Continue losartan 12.5 mg daily. - Continue spironolactone 12.5 daily.  - ? Trial of low dose  blocker today  3. VSD + Small Apical Rupture: mechanical complication of acute MI. Unfortunately not a surgical candidate. Treating conservatively. No clinical signs of tamponade.  - If apical perforation walls off, could consider attempt at percutaneous treatment of VSD with closure device.  - Will repeat limited echo today to follow pericardial effusion and apical perforation/VSD.  3. Diabetes: pioglitazone discontinued due  to CHF. Plan eventual addition of an SGLT2i 4. PAD: s/p left BKA, nonhealing ulcer on right.  5. Crohns disease: Has been on chronic prednisone, continued here.  Higher risk of mechanical complication of MI with prednisone.  6. Complete heart block: Transient complication of anterior MI with no-reflow. No recurrent HB. Normal conduction. Temp pacer removed 12/15 and rhythm stable.  7. Hypokalemia: 3.4 today. Will supp K   Length of Stay: [place], PA-C  06/23/2019, 8:50 AM  Advanced Heart Failure Team Pager [phone] (M-F; 7a - 4p)  Please contact [facility] for night-coverage after hours (4p -7a ) and weekends on [email or web address]  Patient seen with PA, agree with the above note.   He was stable overnight, remains in NSR without any further heart block.  He is off dobutamine.  CVP 7 and co-ox 65%.    On exam, no JVD.  No edema right leg.  Clear lungs and regular rhythm.   Patient has EF around 40% but has MI-related VSD as well as small apical rupture with pericardial effusion.   - Not surgical candidate, will have to manage medically.  - Repeat limited echo today to follow the  pericardial effusion/apical perforation.  - If apical perforation walls off and he remains stable, could consider attempt at percutaneous treatment of VSD with closure device => reviewed with Dr. Burt Knack, will reassess probably with TEE and RHC + LVgram views in a couple of weeks to see if this would be feasible.  - Patient and family understand significant mortality risk, he is DNR.   Rhythm remains NSR off dobutamine.   Continue spironolactone and losartan at low doses as well as Lasix 40 mg daily.  Will add Coreg 3.125 mg bid and follow telemetry carefully.   Mobilize with PT, to chair.   Loralie Champagne 06/23/2019 10:43 AM

## 2019-06-23 NOTE — Progress Notes (Signed)
*  PRELIMINARY RESULTS* Echocardiogram 2D Echocardiogram LIMITED has been performed.  Leavy Cella 06/23/2019, 1:23 PM

## 2019-06-24 LAB — BASIC METABOLIC PANEL
Anion gap: 12 (ref 5–15)
BUN: 17 mg/dL (ref 8–23)
CO2: 22 mmol/L (ref 22–32)
Calcium: 8.8 mg/dL — ABNORMAL LOW (ref 8.9–10.3)
Chloride: 106 mmol/L (ref 98–111)
Creatinine, Ser: 1.02 mg/dL (ref 0.61–1.24)
GFR calc Af Amer: >60 mL/min (ref >60)
GFR calc non Af Amer: >60 mL/min (ref >60)
Glucose, Bld: 200 mg/dL — ABNORMAL HIGH (ref 70–99)
Potassium: 4.2 mmol/L (ref 3.5–5.1)
Sodium: 140 mmol/L (ref 135–145)

## 2019-06-24 LAB — CBC WITH DIFFERENTIAL/PLATELET
Abs Immature Granulocytes: 0.04 10*3/uL (ref 0.00–0.07)
Basophils Absolute: 0 10*3/uL (ref 0.0–0.1)
Basophils Relative: 0 %
Eosinophils Absolute: 0.1 10*3/uL (ref 0.0–0.5)
Eosinophils Relative: 1 %
HCT: 37.5 % — ABNORMAL LOW (ref 39.0–52.0)
Hemoglobin: 11.2 g/dL — ABNORMAL LOW (ref 13.0–17.0)
Immature Granulocytes: 0 %
Lymphocytes Relative: 22 %
Lymphs Abs: 2.6 10*3/uL (ref 0.7–4.0)
MCH: 25.6 pg — ABNORMAL LOW (ref 26.0–34.0)
MCHC: 29.9 g/dL — ABNORMAL LOW (ref 30.0–36.0)
MCV: 85.6 fL (ref 80.0–100.0)
Monocytes Absolute: 1.5 10*3/uL — ABNORMAL HIGH (ref 0.1–1.0)
Monocytes Relative: 12 %
Neutro Abs: 7.8 10*3/uL — ABNORMAL HIGH (ref 1.7–7.7)
Neutrophils Relative %: 65 %
Platelets: 268 10*3/uL (ref 150–400)
RBC: 4.38 MIL/uL (ref 4.22–5.81)
RDW: 16.6 % — ABNORMAL HIGH (ref 11.5–15.5)
WBC: 12.1 10*3/uL — ABNORMAL HIGH (ref 4.0–10.5)
nRBC: 0 % (ref 0.0–0.2)

## 2019-06-24 LAB — GLUCOSE, CAPILLARY
Glucose-Capillary: 133 mg/dL — ABNORMAL HIGH (ref 70–99)
Glucose-Capillary: 266 mg/dL — ABNORMAL HIGH (ref 70–99)
Glucose-Capillary: 211 mg/dL — ABNORMAL HIGH (ref 70–99)

## 2019-06-24 LAB — COOXEMETRY PANEL
Carboxyhemoglobin: 0.6 % (ref 0.5–1.5)
Methemoglobin: 3.1 % — ABNORMAL HIGH (ref 0.0–1.5)
O2 Saturation: 66 %
Total hemoglobin: 11.2 g/dL — ABNORMAL LOW (ref 12.0–16.0)

## 2019-06-24 NOTE — Progress Notes (Signed)
Physical Therapy Treatment and Discharge Patient Details Name: Rodney Sullivan MRN: [id number] DOB: [birth year] Today's Date: 06/24/2019    History of Present Illness 78 y.o. male Admitted for acute anterior MI c/b cardiogenic shock and transient CHB (resolved). S/p DES to LAD. EF ~35-40%. Also w/ mechanical complication w/ infarct-related VSD in the mid-apical interventricular septum w/ small LV apical rupture communicating with the pericardial space, confirmed by TEE. PMH significant for  L BKA, chrons disease, cancer, DM.     PT Comments    Pt tolerated treatment well, performing transfers, bed mobility, and wheelchair mobility without physical assistance. Pt is able to tolerate household and limited community distances of wheelchair mobility as well as turns at this time without cardiopulmonary symptoms. Pt and spouse report he is near his functional baseline at this time and both feel confident in managing mobility at his current level. Pt is at an adequate mobility level for discharge home with supervision of family. Pt is encouraged to continue mobilizing OOB multiple times daily with assistance of staff. Acute PT discharged at this time. Pt will still benefit from HHPT to initiate a graded return to physical activity in the home setting at time of discharge.   Follow Up Recommendations  Home health PT     Equipment Recommendations  None recommended by PT    Recommendations for Other Services       Precautions / Restrictions Precautions Precautions: Fall Restrictions Weight Bearing Restrictions: No Other Position/Activity Restrictions: L BKA    Mobility  Bed Mobility Overal bed mobility: Modified Independent             General bed mobility comments: increased time  Transfers Overall transfer level: Needs assistance Equipment used: None Transfers: Sit to/from Stand;Stand Pivot Transfers Sit to Stand: Modified independent (Device/Increase time) Stand pivot transfers:  Supervision          Ambulation/Gait                 Theme park manager mobility: Yes Wheelchair propulsion: Both upper extremities Wheelchair parts: Needs assistance Distance: 150 Wheelchair Assistance Details (indicate cue type and reason): pt requires supervision for turns in tight spaces primarily 2/2 line management. Pt requiring assistance with wheelchair brakes at this time  Modified Rankin (Stroke Patients Only)       Balance Overall balance assessment: Modified Independent                                          Cognition Arousal/Alertness: Awake/alert Behavior During Therapy: WFL for tasks assessed/performed Overall Cognitive Status: Within Functional Limits for tasks assessed                                        Exercises      General Comments General comments (skin integrity, edema, etc.): VSS, HR ranging from 100-110 at rest and with mobility      Pertinent Vitals/Pain Pain Assessment: No/denies pain    Home Living                      Prior Function            PT Goals (current goals can now be found in the  care plan section) Acute Rehab PT Goals Patient Stated Goal: to get my independence back Progress towards PT goals: Goals met/education completed, patient discharged from PT    Frequency    (D/C acute services)      PT Plan Other (comment);Frequency needs to be updated(D/C acute PT services)    Co-evaluation              AM-PAC PT "6 Clicks" Mobility   Outcome Measure  Help needed turning from your back to your side while in a flat bed without using bedrails?: None Help needed moving from lying on your back to sitting on the side of a flat bed without using bedrails?: None Help needed moving to and from a bed to a chair (including a wheelchair)?: None Help needed standing up from a chair using your arms (e.g.,  wheelchair or bedside chair)?: None Help needed to walk in hospital room?: A Lot Help needed climbing 3-5 steps with a railing? : Total 6 Click Score: 19    End of Session Equipment Utilized During Treatment: (none) Activity Tolerance: Patient tolerated treatment well Patient left: in chair;with call bell/phone within reach;with family/visitor present Nurse Communication: Mobility status PT Visit Diagnosis: Muscle weakness (generalized) (M62.81)     Time: 3013-1438 PT Time Calculation (min) (ACUTE ONLY): 32 min  Charges:  $Therapeutic Activity: 8-22 mins $Wheel Chair Management: 8-22 mins                     Zenaida Niece, PT, DPT Acute Rehabilitation Pager: [phone]    Zenaida Niece 06/24/2019, 3:25 PM

## 2019-06-24 NOTE — Evaluation (Signed)
Occupational Therapy Evaluation Patient Details Name: Rodney Sullivan MRN: [id number] DOB: Jun 05, 1941 Today's Date: 06/24/2019    History of Present Illness 78 y.o. male Admitted for acute anterior MI c/b cardiogenic shock and transient CHB (resolved). S/p DES to LAD. EF ~35-40%. Also w/ mechanical complication w/ infarct-related VSD in the mid-apical interventricular septum w/ small LV apical rupture communicating with the pericardial space, confirmed by TEE. PMH significant for  L BKA, chrons disease, cancer, DM.    Clinical Impression   PTA patient reports modified independent for transfers, mobility in w/c with some assist to push on carpet, and some assist for LB ADLs/toileting.  Patient admitted for above and currently limited by problem list below, including generalized weakness, impaired balance, and decreased activity tolerance. Patient near baseline for ADLs, but could benefit from further OT services to optimize independence and safety with compensatory techniques. Demonstrates ability to complete transfers with min guard assist, LB ADls with mod assist and toileting with mod assist.  Educated on 1 handed and lateral lean techniques for LB dressing/toileting. Will follow acutely, and recommend continued HHOT services at dc.      Follow Up Recommendations  Home health OT;Supervision/Assistance - 24 hour    Equipment Recommendations  None recommended by OT    Recommendations for Other Services       Precautions / Restrictions Precautions Precautions: Fall Restrictions Weight Bearing Restrictions: No      Mobility Bed Mobility Overal bed mobility: Modified Independent                Transfers Overall transfer level: Needs assistance Equipment used: None Transfers: Stand Pivot Transfers   Stand pivot transfers: Min guard       General transfer comment: min guard for safety, to/from [facility] with cueing for technique (as setup different from home)     Balance  Overall balance assessment: Needs assistance Sitting-balance support: No upper extremity supported;Feet supported Sitting balance-Leahy Scale: Good     Standing balance support: Bilateral upper extremity supported;During functional activity Standing balance-Leahy Scale: Poor Standing balance comment: relaint on BUE support in standing                            ADL either performed or assessed with clinical judgement   ADL Overall ADL's : Needs assistance/impaired     Grooming: Set up;Sitting   Upper Body Bathing: Set up;Sitting   Lower Body Bathing: Minimal assistance;Sit to/from stand   Upper Body Dressing : Set up;Sitting   Lower Body Dressing: Moderate assistance;Sit to/from stand Lower Body Dressing Details (indicate cue type and reason): requires assist to manage clothes over hips and to reach R foot Toilet Transfer: Min Statistician Details (indicate cue type and reason): min guard for safety Toileting- Clothing Manipulation and Hygiene: Moderate assistance;Sitting/lateral lean;Sit to/from stand Toileting - Clothing Manipulation Details (indicate cue type and reason): patient requires setup for hygiene seated lateral lean technique, min guard in standing with support to manage pants      Functional mobility during ADLs: Min guard General ADL Comments: pt limited by weakness, impaired balance     Vision   Vision Assessment?: No apparent visual deficits     Perception     Praxis      Pertinent Vitals/Pain Pain Assessment: No/denies pain     Hand Dominance Right   Extremity/Trunk Assessment Upper Extremity Assessment Upper Extremity Assessment: Overall WFL for tasks assessed   Lower Extremity Assessment Lower Extremity  Assessment: Defer to PT evaluation(L BKA)   Cervical / Trunk Assessment Cervical / Trunk Assessment: Normal   Communication Communication Communication: No difficulties   Cognition Arousal/Alertness:  Awake/alert Behavior During Therapy: WFL for tasks assessed/performed Overall Cognitive Status: Within Functional Limits for tasks assessed                                     General Comments  VSS, HR ranged from 99-107 during session; spouse present and supportive    Exercises     Shoulder Instructions      Home Living Family/patient expects to be discharged to:: Private residence Living Arrangements: Spouse/significant other Available Help at Discharge: Family;Available 24 hours/day Type of Home: House Home Access: Ramped entrance     Home Layout: One level     Bathroom Shower/Tub: Teacher, early years/pre: Handicapped height     Home Equipment: Environmental consultant - 2 wheels;Walker - 4 wheels;Wheelchair - manual;Grab bars - toilet;Hand held shower head;Tub bench          Prior Functioning/Environment Level of Independence: Needs assistance  Gait / Transfers Assistance Needed: supervision for transfers, assistance with manual wheelchair for community distances ADL's / Homemaking Assistance Needed: supervision for ADLs, some assist for toileting (spouse assist with clohting mgmt) and LB dressing, using wc for mobility mainly with some assist due to carpet at home             OT Problem List: Decreased strength;Decreased activity tolerance;Impaired balance (sitting and/or standing);Decreased safety awareness;Decreased knowledge of use of DME or AE      OT Treatment/Interventions: Self-care/ADL training;Energy conservation;DME and/or AE instruction;Therapeutic activities;Patient/family education;Balance training;Therapeutic exercise    OT Goals(Current goals can be found in the care plan section) Acute Rehab OT Goals Patient Stated Goal: to get my independence back OT Goal Formulation: With patient Time For Goal Achievement: 07/08/19 Potential to Achieve Goals: Good  OT Frequency: Min 2X/week   Barriers to D/C:            Co-evaluation               AM-PAC OT "6 Clicks" Daily Activity     Outcome Measure Help from another person eating meals?: None Help from another person taking care of personal grooming?: A Little Help from another person toileting, which includes using toliet, bedpan, or urinal?: A Lot Help from another person bathing (including washing, rinsing, drying)?: A Lot Help from another person to put on and taking off regular upper body clothing?: None Help from another person to put on and taking off regular lower body clothing?: A Lot 6 Click Score: 17   End of Session Nurse Communication: Mobility status  Activity Tolerance: Patient tolerated treatment well Patient left: in bed;with call bell/phone within reach;with nursing/sitter in room;with family/visitor present  OT Visit Diagnosis: Other abnormalities of gait and mobility (R26.89);Muscle weakness (generalized) (M62.81)                Time: 4196-2229 OT Time Calculation (min): 31 min Charges:  OT General Charges $OT Visit: 1 Visit OT Evaluation $OT Eval Moderate Complexity: 1 Mod OT Treatments $Self Care/Home Management : 8-22 mins  Jolaine Artist, OT Acute Rehabilitation Services Pager [phone] Office [phone]   Delight Stare 06/24/2019, 10:49 AM

## 2019-06-24 NOTE — Progress Notes (Addendum)
Patient ID: Rodney Sullivan, male   DOB: 1941/01/20, 78 y.o.   MRN: [id number]     Advanced Heart Failure Rounding Note  PCP-Cardiologist: Peter Martinique, MD  AHF: Dr. Aundra Dubin   Subjective:    Admitted for acute anterior MI c/b cardiogenic shock and transient CHB (resolved). S/p DES to LAD. EF ~35-40%. Also w/ mechanical complication w/ infarct-related VSD in the mid-apical interventricular septum w/ small LV apical rupture communicating with the pericardial space, confirmed by TEE. Unfortunately felt to be a poor surgical candidate. Treating conservatively.   Dobutamine discontinued 12/15. Co-ox stable at 66%. No further CHB. Temp pacer wire removed 12/15. Rhythm remains stable on tele, NSR.  He was started on Coreg yesterday.   Limited echo yesterday, EF 45%, VSD not visualized.  Moderate pericardial effusion, unchanged.  Unable to visual apical perforation.   Feels ok currently. No CP or dyspnea. SBP 90s-110s.     Objective:   Weight Range: 68.7 kg Body mass index is 23.72 kg/m.   Vital Signs:   Temp:  [97.8 F (36.6 C)-98.4 F (36.9 C)] 97.8 F (36.6 C) (12/17 0701) Pulse Rate:  [86-101] 96 (12/17 0701) Resp:  [15-29] 21 (12/17 0701) BP: (91-117)/(52-66) 117/66 (12/17 0701) SpO2:  [91 %-97 %] 94 % (12/17 0701) Weight:  [68.7 kg] 68.7 kg (12/17 0319) Last BM Date: 06/23/19  Weight change: Filed Weights   06/22/19 0426 06/23/19 0556 06/24/19 0319  Weight: 74.3 kg 70.1 kg 68.7 kg    Intake/Output:   Intake/Output Summary (Last 24 hours) at 06/24/2019 0854 Last data filed at 06/24/2019 0700 Gross per 24 hour  Intake 240 ml  Output 700 ml  Net -460 ml      Physical Exam   General: NAD Neck: No JVD, no thyromegaly or thyroid nodule.  Lungs: Clear to auscultation bilaterally with normal respiratory effort. CV: Nondisplaced PMI.  Heart regular S1/S2, no S3/S4, no murmur.  No peripheral edema.   Abdomen: Soft, nontender, no hepatosplenomegaly, no distention.  Skin:  Intact without lesions or rashes.  Neurologic: Alert and oriented x 3.  Psych: Normal affect. Extremities: No clubbing or cyanosis. L BKA.  HEENT: Normal.    Telemetry   NSR 100s. No further CHB.   EKG    No new EKG to review today  Labs    CBC Recent Labs    06/23/19 0500 06/24/19 0214  WBC 11.1* 12.1*  NEUTROABS 7.0 7.8*  HGB 10.4* 11.2*  HCT 33.8* 37.5*  MCV 85.6 85.6  PLT 217 297   Basic Metabolic Panel Recent Labs    06/23/19 0500 06/24/19 0214  NA 142 140  K 3.4* 4.2  CL 106 106  CO2 25 22  GLUCOSE 127* 200*  BUN 14 17  CREATININE 0.91 1.02  CALCIUM 8.4* 8.8*   Liver Function Tests No results for input(s): AST, ALT, ALKPHOS, BILITOT, PROT, ALBUMIN in the last 72 hours. No results for input(s): LIPASE, AMYLASE in the last 72 hours. Cardiac Enzymes No results for input(s): CKTOTAL, CKMB, CKMBINDEX, TROPONINI in the last 72 hours.  BNP: BNP (last 3 results) Recent Labs    06/19/19 0356  BNP 268.9*    ProBNP (last 3 results) No results for input(s): PROBNP in the last 8760 hours.   D-Dimer No results for input(s): DDIMER in the last 72 hours. Hemoglobin A1C No results for input(s): HGBA1C in the last 72 hours. Fasting Lipid Panel No results for input(s): CHOL, HDL, LDLCALC, TRIG, CHOLHDL, LDLDIRECT in the last 72 hours.  Thyroid Function Tests No results for input(s): TSH, T4TOTAL, T3FREE, THYROIDAB in the last 72 hours.  Invalid input(s): FREET3  Other results:   Imaging    ECHOCARDIOGRAM LIMITED  Result Date: 06/23/2019   ECHOCARDIOGRAM LIMITED REPORT   Patient Name:   Rodney Sullivan Date of Exam: 06/23/2019 Medical Rec #:  [id number]      Height:       67.0 in Accession #:    [id number]     Weight:       154.5 lb Date of Birth:  [birth year]      BSA:          1.81 m Patient Age:    4 years       BP:           96/63 mmHg Patient Gender: M              HR:           94 bpm. Exam Location:  Inpatient  Procedure: 2D Echo Indications:     CHF-Acute Systolic 962.83 / M62.94  History:        Patient has prior history of Echocardiogram examinations, most                 recent 06/21/2019. Sepsis, Pulmonary embolism , Heart block AV                 complete , Right bundle branch block, STEMI involving left                 anterior descending coronary artery.  Sonographer:    Leavy Cella Referring Phys: [place]  1. LVEF is mildly decreased 45-50%. Mid septum to apex severe hypokinesis/akinesis consistent with prior LAD infarction. There is a moderate to large pericardial effusion with thrombus related to apical LV rupture. No direct communication is seen. Overall, this is similar in appearance to the study from 12/14.  2. Patient with known apical LV rupture. There is a moderate to large pericardial effusion which is thrombus due to LV apial rupture. There is no RV diastolic collapse and no respiratory variation in MV inflow. The IVC is remarkably collapsable. Clinical correlation for cardiac tamponade is recommended.  3. Mid and apical anterior septum, mid and apical inferior septum, and apex are abnormal.  4. The left ventricle demonstrates regional wall motion abnormalities.  5. Presence of pericardial fat pad.  6. The pericardial effusion is circumferential.  7. Mild mitral annular calcification.  8. The tricuspid valve was grossly normal. Tricuspid valve regurgitation is trivial.  9. The inferior vena cava is normal in size with greater than 50% respiratory variability, suggesting right atrial pressure of 3 mmHg. 10. The aortic valve was not well visualized. Aortic valve regurgitation is not visualized. 11. Left ventricular ejection fraction, by visual estimation, is 45 to 50%. The left ventricle has mildly decreased function. There is no left ventricular hypertrophy. 12. The mitral valve is grossly normal. Trivial mitral valve regurgitation. 13. Large pericardial effusion. 14. Global right ventricle has normal systolic  function.The right ventricular size is normal. No increase in right ventricular wall thickness. 15. The tricuspid valve is grossly normal. Tricuspid valve regurgitation is trivial. FINDINGS  Left Ventricle: Left ventricular ejection fraction, by visual estimation, is 45 to 50%. The left ventricle has mildly decreased function. The left ventricle demonstrates regional wall motion abnormalities. LVEF is mildly decreased 45-50%. Mid septum to apex severe hypokinesis/akinesis consistent with prior LAD infarction. There  is a moderate to large pericardial effusion with thrombus related to apical LV rupture. No direct communication is seen. Overall, this is similar in appearance to the study from  12/14.  LV Wall Scoring: The mid and apical anterior septum, mid and apical inferior septum, and apex are hypokinetic. Right Ventricle: The right ventricular size is normal. No increase in right ventricular wall thickness. Global RV systolic function is has normal systolic function. Left Atrium: Left atrial size was normal in size. Right Atrium: Right atrial size was normal in size. Right atrial pressure is estimated at 3 mmHg. Pericardium: A moderately sized pericardial effusion is present is seen. A large pericardial effusion is present. The pericardial effusion is circumferential. Presence of pericardial fat pad. Patient with known apical LV rupture. There is a moderate to large pericardial effusion which is thrombus due to LV apial rupture. There is no RV diastolic collapse and no respiratory variation in MV inflow. The IVC is remarkably collapsable. Clinical correlation for cardiac tamponade is recommended. Mitral Valve: The mitral valve is degenerative in appearance. Mild mitral annular calcification. No evidence of mitral valve regurgitation. Tricuspid Valve: The tricuspid valve is grossly normal. Tricuspid valve regurgitation is trivial. Aortic Valve: The aortic valve was not well visualized. The aortic valve was not well  visualized. Aorta: The aortic root is normal in size and structure. Venous: The inferior vena cava is normal in size with greater than 50% respiratory variability, suggesting right atrial pressure of 3 mmHg.  Eleonore Chiquito MD Electronically signed by Eleonore Chiquito MD Signature Date/Time: 06/23/2019/5:11:30 PMThe mitral valve is degenerative in appearance.    Final      Medications:     Scheduled Medications: . allopurinol  100 mg Oral Daily  . aspirin EC  81 mg Oral Q supper  . brimonidine  1 drop Both Eyes Q12H   And  . timolol  1 drop Both Eyes Q12H  . carvedilol  3.125 mg Oral BID WC  . Chlorhexidine Gluconate Cloth  6 each Topical Daily  . cholecalciferol  2,000 Units Oral Daily  . ferrous sulfate  325 mg Oral Daily  . furosemide  40 mg Oral Daily  . insulin aspart  0-5 Units Subcutaneous QHS  . insulin aspart  0-9 Units Subcutaneous TID WC  . latanoprost  1 drop Both Eyes QHS  . losartan  12.5 mg Oral Daily  . mesalamine  2,000 mg Oral BID  . multivitamin with minerals  1 tablet Oral Daily  . omega-3 acid ethyl esters  1 g Oral Daily  . pantoprazole  40 mg Oral Q1500  . predniSONE  22.5 mg Oral Q breakfast  . rosuvastatin  40 mg Oral q1800  . sodium chloride flush  10-40 mL Intracatheter Q12H  . sodium chloride flush  3 mL Intravenous Q12H  . spironolactone  12.5 mg Oral Daily  . ticagrelor  90 mg Oral BID  . vitamin B-12  2,000 mcg Oral Daily    Infusions: . sodium chloride 10 mL/hr at 06/22/19 0800  . sodium chloride      PRN Medications: sodium chloride, sodium chloride, acetaminophen, albuterol, ALPRAZolam, LORazepam, nitroGLYCERIN, ondansetron (ZOFRAN) IV, sodium chloride flush, sodium chloride flush, traMADol, zolpidem    Patient Profile   78 y.o male with history of extensive PAD, type 2 diabetes, prior PE/DVT, and Crohns disease was admitted with anterior MI. Emergent cath on 12/11 showed acutely occluded mLAD and CTO D1.  LAD was opened with 2 DES but  there was  no-reflow.  On 12/12, he developed complete heart block and temporary transvenous pacer was placed. RHC showed low output (CI 1.99 Fick) but PCWP 11. Initial co-ox 52% and started on dobutamine. Echo w/ apical and peri-apical severe akinesis, EF ~ 35%, normal RV function. Also w/ infarct-related VSD in the mid-apical interventricular septum w/ small LV apical rupture communicating with the pericardial space, confirmed by TEE. Unfortunately felt to be a poor surgical candidate. Treating conservatively.   Assessment/Plan   1. CAD: Acute anteroseptal MI, somewhat delayed presentation.  Cath with acutely occluded mLAD and CTO D1.  DES x 2 to LAD, but no-reflow. No chest pain.  - Continue ASA 81 and ticagrelor 90 bid.  - Atorvastatin 80 mg daily.  - Tolerating Coreg without bradyarrhythmias.  2. Acute systolic CHF: Ischemic cardiomyopathy.  EF ~ 35% initially with apical/peri-apical severe hypokinesis to akinesis. RHC showed low output (CI 1.99 Fick). Started on dobutamine 12/13. Initial co-ox, 52%. Dobutamine discontinued 12/15. Co-ox 66% this morning. Repeat limited echo yesterday with EF 45%.  No BP room to increase meds.  - Continue losartan 12.5 mg daily. - Continue spironolactone 12.5 daily.  - Continue Coreg 3.125 mg bid.  3. VSD + Small Apical Rupture: mechanical complication of acute MI. Unfortunately not a surgical candidate. Treating conservatively. Viewed limited echo from yesterday, pericardial effusion seems stable (moderate) without tamponade, cannot see perforation with active flow on this echo, ?if walled off.  -  If apical perforation walls off and he remains stable, could consider attempt at percutaneous treatment of VSD with closure device => reviewed with Dr. Burt Knack, will reassess probably with TEE and RHC + LVgram views in a couple of weeks to see if this would be feasible.  3. Diabetes: pioglitazone discontinued due to CHF. Plan eventual addition of an SGLT2i 4. PAD: s/p  left BKA, nonhealing ulcer on right.  5. Crohns disease: Has been on chronic prednisone, continued here.  Higher risk of mechanical complication of MI with prednisone.  6. Complete heart block: Transient complication of anterior MI with no-reflow. No recurrent HB. Normal conduction. Temp pacer removed 12/15 and rhythm stable.  He has tolerated Coreg.  7. Mobilize with PT.   Maybe home tomorrow.   Length of Stay: 6  Loralie Champagne, MD  06/24/2019, 8:54 AM  Advanced Heart Failure Team Pager [phone] (M-F; 7a - 4p)  Please contact [facility] for night-coverage after hours (4p -7a ) and weekends on [email or web address]

## 2019-06-24 NOTE — TOC Progression Note (Addendum)
Transition of Care [facility]) - Progression Note    Patient Details  Name: Rodney Sullivan MRN: [id number] Date of Birth: 21-Apr-1941  Transition of Care [facility]) CM/SW Contact  Zenon Mayo, RN Phone Number: 06/24/2019, 9:26 AM  Clinical Narrative:    NCM spoke with patient and wife, wife states they are active with [facility] for [facility] , HHPT and would like to stay with them for [facility] services.  NCM notified Tommi Rumps with [facility], also will need to add HHOT.  Soc will begin 24 to 48 hrs post dc.  NCM awaiting to hear if patient may need palliative services .  TOC team will continue to follow for dc needs.  Per palliative consult, patient would like to go home with [facility] services,which he is already set up for.    Expected Discharge Plan: [place] Barriers to Discharge: No Barriers Identified  Expected Discharge Plan and Services Expected Discharge Plan: [place]   Discharge Planning Services: CM Consult Post Acute Care Choice: [place] arrangements for the past 2 months: Single Family Home                 DME Arranged: (NA)         HH Arranged: RN, PT, OT HH Agency: [place] Date [facility] Agency Contacted: 06/24/19 Time [place]: [phone] Representative spoke with at [place]: [place]) Interventions    Readmission Risk Interventions Readmission Risk Prevention Plan 03/18/2019  Transportation Screening Complete  PCP or Specialist Appt within 3-5 Days Complete  HRI or [place] Complete  Social Work Consult for [place] Planning/Counseling Complete  Palliative Care Screening Not Applicable  Medication Review Press photographer) Complete  Some recent data might be hidden

## 2019-06-24 NOTE — Progress Notes (Signed)
Daily Progress Note   Patient Name: Rodney Sullivan       Date: 06/24/2019 DOB: 02-24-1941  Age: 78 y.o. MRN#: [id number] Attending Physician: [place], Peter M, MD Primary Care Physician: Emelia Loron Admit Date: 06/18/2019  Reason for Consultation/Follow-up: Establishing goals of care  Subjective: I met today with Rodney Sullivan and his wife at the bedside.  He has already discussed with Dr,. McLean today.  We reviewed again results of repeat echo and consideration of VSD closure device in a couple of weeks if apical perforation does wall off.  He is hopeful to transition home tomorrow.  He wanted to talk about home care options with benefits of home health vs hospice services.  He does feel that he would benefit from continued home based PT, and we discussed how this is not generally covered by hospice services.  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  We discussed that if his condition declines at home, he could be enrolled in hospice from home.   Length of Stay: 6  Current Medications: Scheduled Meds:  . allopurinol  100 mg Oral Daily  . aspirin EC  81 mg Oral Q supper  . brimonidine  1 drop Both Eyes Q12H   And  . timolol  1 drop Both Eyes Q12H  . carvedilol  3.125 mg Oral BID WC  . Chlorhexidine Gluconate Cloth  6 each Topical Daily  . cholecalciferol  2,000 Units Oral Daily  . ferrous sulfate  325 mg Oral Daily  . furosemide  40 mg Oral Daily  . insulin aspart  0-5 Units Subcutaneous QHS  . insulin aspart  0-9 Units Subcutaneous TID WC  . latanoprost  1 drop Both Eyes QHS  . losartan  12.5 mg Oral Daily  . mesalamine  2,000 mg Oral BID  . multivitamin with minerals  1 tablet Oral Daily  .  omega-3 acid ethyl esters  1 g Oral Daily  . pantoprazole  40 mg Oral Q1500  . predniSONE  22.5 mg Oral Q breakfast  . rosuvastatin  40 mg Oral q1800  . sodium chloride flush  10-40 mL Intracatheter Q12H  . sodium chloride flush  3 mL Intravenous Q12H  . spironolactone  12.5 mg Oral Daily  .  ticagrelor  90 mg Oral BID  . vitamin B-12  2,000 mcg Oral Daily    Continuous Infusions: . sodium chloride 10 mL/hr at 06/22/19 0800  . sodium chloride      PRN Meds: sodium chloride, sodium chloride, acetaminophen, albuterol, ALPRAZolam, LORazepam, nitroGLYCERIN, ondansetron (ZOFRAN) IV, sodium chloride flush, sodium chloride flush, traMADol, zolpidem  Physical Exam         General: Alert, awake, in no acute distress.  HEENT: No bruits, no goiter, no JVD Heart: Regular rate and rhythm. Lungs: Good air movement Abdomen: Nondistended Ext: Left BKA, No significant edema Skin: Warm and dry Neuro: Grossly intact, nonfocal.   Vital Signs: BP (!) 89/61 (BP Location: Right Arm)   Pulse 92   Temp 98.3 F (36.8 C) (Oral)   Resp 13   Wt 68.7 kg   SpO2 98%   BMI 23.72 kg/m  SpO2: SpO2: 98 % O2 Device: O2 Device: Room Air O2 Flow Rate: O2 Flow Rate (L/min): 3 L/min  Intake/output summary:   Intake/Output Summary (Last 24 hours) at 06/24/2019 1234 Last data filed at 06/24/2019 1000 Gross per 24 hour  Intake 480 ml  Output 500 ml  Net -20 ml   LBM: Last BM Date: 06/24/19 Baseline Weight: Weight: 77 kg Most recent weight: Weight: 68.7 kg       Palliative Assessment/Data:      Patient Active Problem List   Diagnosis Date Noted  . Palliative care encounter 06/23/2019  . DNR no code (do not resuscitate) 06/23/2019  . Acute systolic heart failure ([place]) 06/19/2019  . Right bundle branch block 06/19/2019  . Ischemia of left BKA site ([place]) 06/19/2019  . Heart block AV complete ([place])   . STEMI (ST elevation myocardial infarction) ([place]) 06/18/2019  . STEMI involving left anterior  descending coronary artery ([place]) 06/18/2019  . Bacteremia due to Escherichia coli 04/27/2019  . Gangrene of left foot ([place]) 03/01/2019  . Sepsis ([place]) 02/15/2019  . Anemia 02/15/2019  . Cellulitis and abscess of toe of left foot 02/15/2019  . Peripheral arterial disease ([place]) 02/15/2019  . DM (diabetes mellitus), type 2 with peripheral vascular complications ([place]) 38/88/2800  . Gout 02/15/2019  . Septic shock ([place]) 12/14/2013  . Pulmonary embolism ([place]) 12/14/2013  . Crohn disease ([place]) 12/14/2013    Palliative Care Assessment & Plan   Patient Profile: 78 yo male with complicated course including anterior MI complicated by cardiogenic shock and transient complete heart block that is now resolved.  He is status post drug-eluting stents to LAD without restoration of flow.  Additionally, he has mechanical complication with an infarct-related VSD and small LV apical rupture communicating with pericardial spaces confirmed by TEE.  He is a poor surgical candidate and is being treated conservatively.  Assessment: Patient Active Problem List   Diagnosis Date Noted  . Palliative care encounter 06/23/2019  . DNR no code (do not resuscitate) 06/23/2019  . Acute systolic heart failure ([place]) 06/19/2019  . Right bundle branch block 06/19/2019  . Ischemia of left BKA site ([place]) 06/19/2019  . Heart block AV complete ([place])   . STEMI (ST elevation myocardial infarction) ([place]) 06/18/2019  . STEMI involving left anterior descending coronary artery ([place]) 06/18/2019  . Bacteremia due to Escherichia coli 04/27/2019  . Gangrene of left foot ([place]) 03/01/2019  . Sepsis ([place]) 02/15/2019  . Anemia 02/15/2019  . Cellulitis and abscess of toe of left foot 02/15/2019  . Peripheral arterial disease ([place]) 02/15/2019  . DM (diabetes mellitus), type  2 with peripheral vascular complications ([place]) 95/03/3266  . Gout 02/15/2019  . Septic shock ([place]) 12/14/2013  . Pulmonary embolism ([place]) 12/14/2013  . Crohn  disease ([place]) 12/14/2013     Recommendations/Plan: - DNR/DNI - Continue conservative medical management. - Patient and family are hopeful for stabilization, but understand he is still at risk of acute decline. - He is hopeful for transition home with home health soon and plan to follow up for reassessment for possible VSD closure device in a couple of weeks. - Please call if there are immediate needs with which we can be of further assistance.   Code Status:    Code Status Orders  (From admission, onward)         Start     Ordered   06/21/19 1804  Do not attempt resuscitation (DNR)  Continuous    Question Answer Comment  In the event of cardiac or respiratory ARREST Do not call a "code blue"   In the event of cardiac or respiratory ARREST Do not perform Intubation, CPR, defibrillation or ACLS   In the event of cardiac or respiratory ARREST Use medication by any route, position, wound care, and other measures to relive pain and suffering. May use oxygen, suction and manual treatment of airway obstruction as needed for comfort.      06/21/19 1804         Prognosis:   Guarded  Discharge Planning:  Home with [place] was discussed with family, HF team, and RN  Thank you for allowing the Palliative Medicine Team to assist in the care of this patient.   Time In: 1150 Time Out: 1225 Total Time 35 Prolonged Time Billed No      Greater than 50%  of this time was spent counseling and coordinating care related to the above assessment and plan.  Micheline Rough, MD  Please contact Palliative Medicine Team phone at [phone] for questions and concerns.

## 2019-06-25 ENCOUNTER — Other Ambulatory Visit (HOSPITAL_COMMUNITY): Payer: Self-pay | Admitting: *Deleted

## 2019-06-25 DIAGNOSIS — I313 Pericardial effusion (noninflammatory): Secondary | ICD-10-CM

## 2019-06-25 DIAGNOSIS — Q210 Ventricular septal defect: Secondary | ICD-10-CM

## 2019-06-25 DIAGNOSIS — I3139 Other pericardial effusion (noninflammatory): Secondary | ICD-10-CM

## 2019-06-25 LAB — BASIC METABOLIC PANEL
Anion gap: 13 (ref 5–15)
BUN: 18 mg/dL (ref 8–23)
CO2: 24 mmol/L (ref 22–32)
Calcium: 8.8 mg/dL — ABNORMAL LOW (ref 8.9–10.3)
Chloride: 104 mmol/L (ref 98–111)
Creatinine, Ser: 1.05 mg/dL (ref 0.61–1.24)
GFR calc Af Amer: >60 mL/min (ref >60)
GFR calc non Af Amer: >60 mL/min (ref >60)
Glucose, Bld: 125 mg/dL — ABNORMAL HIGH (ref 70–99)
Potassium: 4.2 mmol/L (ref 3.5–5.1)
Sodium: 141 mmol/L (ref 135–145)

## 2019-06-25 LAB — GLUCOSE, CAPILLARY
Glucose-Capillary: 115 mg/dL — ABNORMAL HIGH (ref 70–99)
Glucose-Capillary: 252 mg/dL — ABNORMAL HIGH (ref 70–99)

## 2019-06-25 LAB — CBC WITH DIFFERENTIAL/PLATELET
Abs Immature Granulocytes: 0.07 10*3/uL (ref 0.00–0.07)
Basophils Absolute: 0 10*3/uL (ref 0.0–0.1)
Basophils Relative: 0 %
Eosinophils Absolute: 0.2 10*3/uL (ref 0.0–0.5)
Eosinophils Relative: 1 %
HCT: 36.3 % — ABNORMAL LOW (ref 39.0–52.0)
Hemoglobin: 10.9 g/dL — ABNORMAL LOW (ref 13.0–17.0)
Immature Granulocytes: 1 %
Lymphocytes Relative: 22 %
Lymphs Abs: 2.6 10*3/uL (ref 0.7–4.0)
MCH: 25.6 pg — ABNORMAL LOW (ref 26.0–34.0)
MCHC: 30 g/dL (ref 30.0–36.0)
MCV: 85.4 fL (ref 80.0–100.0)
Monocytes Absolute: 1.4 10*3/uL — ABNORMAL HIGH (ref 0.1–1.0)
Monocytes Relative: 12 %
Neutro Abs: 7.5 10*3/uL (ref 1.7–7.7)
Neutrophils Relative %: 64 %
Platelets: 296 10*3/uL (ref 150–400)
RBC: 4.25 MIL/uL (ref 4.22–5.81)
RDW: 16.1 % — ABNORMAL HIGH (ref 11.5–15.5)
WBC: 11.8 10*3/uL — ABNORMAL HIGH (ref 4.0–10.5)
nRBC: 0 % (ref 0.0–0.2)

## 2019-06-25 MED ORDER — TICAGRELOR 90 MG PO TABS: 90.0000 mg | ORAL_TABLET | Freq: Two times a day (BID) | ORAL | 6 refills | Status: AC

## 2019-06-25 MED ORDER — FUROSEMIDE 40 MG PO TABS: 40.0000 mg | ORAL_TABLET | Freq: Every day | ORAL | 6 refills | Status: DC

## 2019-06-25 MED ORDER — SPIRONOLACTONE 25 MG PO TABS: 12.5000 mg | ORAL_TABLET | Freq: Every day | ORAL | 6 refills | Status: DC

## 2019-06-25 MED ORDER — CARVEDILOL 3.125 MG PO TABS: 3.1250 mg | ORAL_TABLET | Freq: Two times a day (BID) | ORAL | 6 refills | Status: AC

## 2019-06-25 MED ORDER — LOSARTAN POTASSIUM 25 MG PO TABS: 12.5000 mg | ORAL_TABLET | Freq: Every day | ORAL | 6 refills | Status: DC

## 2019-06-25 MED ORDER — ROSUVASTATIN CALCIUM 40 MG PO TABS: 40.0000 mg | ORAL_TABLET | Freq: Every day | ORAL | 6 refills | Status: AC

## 2019-06-25 MED FILL — LOSARTAN POTASSIUM 25 MG TA: 25 | 30 days supply | Qty: 15 | Fill #0

## 2019-06-25 MED FILL — ROSUVASTATIN CALCIUM 40 MG: 40 | 30 days supply | Qty: 30 | Fill #0

## 2019-06-25 MED FILL — BRILINTA 90 MG TABLET: 90 | 30 days supply | Qty: 60 | Fill #0

## 2019-06-25 MED FILL — SPIRONOLACTONE 25 MG TABLET: 25 | 30 days supply | Qty: 15 | Fill #0

## 2019-06-25 MED FILL — CARVEDILOL 3.125 MG TABLET: 3.125 | 30 days supply | Qty: 60 | Fill #0

## 2019-06-25 MED FILL — FUROSEMIDE 40 MG TABLET: 40 | 30 days supply | Qty: 30 | Fill #0

## 2019-06-25 NOTE — Plan of Care (Signed)
  Problem: Education: Goal: Knowledge of General Education information will improve Description: Including pain rating scale, medication(s)/side effects and non-pharmacologic comfort measures Outcome: Adequate for Discharge   Problem: Health Behavior/Discharge Planning: Goal: Ability to manage health-related needs will improve Outcome: Adequate for Discharge   Problem: Clinical Measurements: Goal: Ability to maintain clinical measurements within normal limits will improve Outcome: Adequate for Discharge Goal: Will remain free from infection Outcome: Adequate for Discharge Goal: Diagnostic test results will improve Outcome: Adequate for Discharge Goal: Respiratory complications will improve Outcome: Adequate for Discharge Goal: Cardiovascular complication will be avoided Outcome: Adequate for Discharge   Problem: Activity: Goal: Risk for activity intolerance will decrease Outcome: Adequate for Discharge   Problem: Nutrition: Goal: Adequate nutrition will be maintained Outcome: Adequate for Discharge   Problem: Coping: Goal: Level of anxiety will decrease Outcome: Adequate for Discharge   Problem: Elimination: Goal: Will not experience complications related to bowel motility Outcome: Adequate for Discharge Goal: Will not experience complications related to urinary retention Outcome: Adequate for Discharge   Problem: Pain Managment: Goal: General experience of comfort will improve Outcome: Adequate for Discharge   Problem: Safety: Goal: Ability to remain free from injury will improve Outcome: Adequate for Discharge   Problem: Skin Integrity: Goal: Risk for impaired skin integrity will decrease Outcome: Adequate for Discharge   Problem: Education: Goal: Understanding of CV disease, CV risk reduction, and recovery process will improve Outcome: Adequate for Discharge Goal: Individualized Educational Video(s) Outcome: Adequate for Discharge   Problem:  Activity: Goal: Ability to return to baseline activity level will improve Outcome: Adequate for Discharge   Problem: Health Behavior/Discharge Planning: Goal: Ability to safely manage health-related needs after discharge will improve Outcome: Adequate for Discharge  Discharge instructions given to patient. Questions answered. Educated on new medication regimen, signs/symptoms, restrictions, and follow up appointments. Prescriptions brought to room by [facility] pharmacy. PIV DC, hemostasis achieved. DC introducer/swan catheter, hemostasis achieved, occlusive dressing to be removed 12/19 AM. Vital signs stable. All belongings sent home with patient.   Pt escorted by RN via wheelchair to private vehicle driven by spouse.  Pt and spouse excited to be home with family, very appreciated of the care given throughout their stay.

## 2019-06-25 NOTE — Progress Notes (Signed)
CARDIAC REHAB PHASE I   Discussed MI, stents, Brilinta, and restrictions (to talk with Aundra Dubin of anything more that transfers). Gave diet sheets for reference. Voiced understanding. Will refer to [place] however will not call pt as he is n/a at this time. Maybe in the future. [place], ACSM 06/25/2019 12:13 PM

## 2019-06-25 NOTE — Discharge Summary (Signed)
Advanced Heart Failure Team  Discharge Summary   Patient ID: Rodney Sullivan MRN: [id number], DOB/AGE: Aug 13, 1940 78 y.o. Admit date: 06/18/2019 D/C date:     06/25/2019   Primary Discharge Diagnoses:   1. CAD: Acute anteroseptal MI, somewhat delayed presentation. Cath with acutely occluded mLAD and CTO D1. DES x 2 to LAD, but no-reflow.  2. Acute systolic CHF 3. VSD + Small Apical Rupture 4. PAD: s/p left BKA, nonhealing ulcer on right.  5. Crohns disease 6. Complete heart block:  7. Deconditioning.  8. DNR/DNI  Hospital Course:  78 y.o. with history of extensive PAD, type 2 diabetes, prior PE/DVT, and Crohns disease was admitted with anterior MI.   Admitted for acute anterior MI c/b cardiogenic shock and transient CHB (resolved). S/p DES to LAD. EF ~35-40%. Also w/ mechanical complication w/ infarct-related VSD in the mid-apical interventricular septum w/ small LV apical rupture communicating with the pericardial space, confirmed by TEE. Unfortunately felt to be a poor surgical candidate with plan for conservative treatment.   He will continue to be followed closely in the HF clinic. Plan to set up for [place] at his follow up appointment.   See below for detailed problem list.   1. CAD: Acute anteroseptal MI, somewhat delayed presentation. Cath with acutely occluded mLAD and CTO D1. DES x 2 to LAD, but no-reflow.  - Continue ASA 81 and ticagrelor 90 bid.  - Atorvastatin 80 mg daily.  - Tolerating Coreg without bradyarrhythmias.  2. Acute systolic CHF: Ischemic cardiomyopathy. EF ~ 35% initially with apical/peri-apical severe hypokinesis to akinesis. RHC showed low output (CI 1.99 Fick). Started on dobutamine 12/13. Initial co-ox, 52%. Dobutamine discontinued 12/15. Repeat limited echo  EF 45%.   - Volume status stable. Continue lasix 40 mg po daily.  - Continue losartan 12.5 mg daily. - Continue spironolactone 12.5 daily.  - Continue Coreg 3.125 mg bid.  3. VSD + Small  Apical Rupture: mechanical complication of acute MI. Unfortunately not a surgical candidate. Treating conservatively. Viewed limited echo from12/16, pericardial effusion seems stable (moderate) without tamponade, cannot see perforation with active flow on this echo, ?if walled off.  -  If apical perforation walls off and he remains stable, could consider attempt at percutaneous treatment of VSD with closure device => reviewed with Dr. Burt Knack, will reassess probably with TEE and RHC + LVgram views in a couple of weeks to see if this would be feasible.  3. Diabetes: pioglitazone discontinued due to CHF. Plan eventual addition of an SGLT2i 4. PAD: s/p left BKA, nonhealing ulcer on right.  5. Crohns disease: Has been on chronic prednisone, continued here. Higher risk of mechanical complication of MI with prednisone.  6. Complete heart block: Transient complication of anterior MI with no-reflow. No recurrent HB. Normal conduction. Temp pacer removed 12/15 and rhythm stable.  He has tolerated Coreg.  7. Mobilize with PT. Followed by Alvis Lemmings.    Discharge Vitals: Blood pressure 125/88, pulse (!) 105, temperature 97.8 F (36.6 C), temperature source Oral, resp. rate 15, weight 67.8 kg, SpO2 99 %.  Labs: Lab Results  Component Value Date   WBC 11.8 (H) 06/25/2019   HGB 10.9 (L) 06/25/2019   HCT 36.3 (L) 06/25/2019   MCV 85.4 06/25/2019   PLT 296 06/25/2019    Recent Labs  Lab 06/19/19 0356 06/19/19 1335 06/25/19 0648  NA 139  --  141  K 4.1  --  4.2  CL 102   < > 104  CO2 23   < >  24  BUN 11   < > 18  CREATININE 1.02   < > 1.05  CALCIUM 8.7*   < > 8.8*  PROT 5.0*  --   --   BILITOT 1.1  --   --   ALKPHOS 47  --   --   ALT 71*  --   --   AST 332*  --   --   GLUCOSE 138*   < > 125*   < > = values in this interval not displayed.   Lab Results  Component Value Date   CHOL 171 06/19/2019   HDL 38 (L) 06/19/2019   LDLCALC UNABLE TO CALCULATE IF TRIGLYCERIDE OVER 400 mg/dL 06/19/2019    TRIG 676 (H) 06/19/2019   BNP (last 3 results) Recent Labs    06/19/19 0356  BNP 268.9*    ProBNP (last 3 results) No results for input(s): PROBNP in the last 8760 hours.   Diagnostic Studies/Procedures   ECHOCARDIOGRAM LIMITED  Result Date: 06/23/2019   ECHOCARDIOGRAM LIMITED REPORT   Patient Name:   Rodney Sullivan Date of Exam: 06/23/2019 Medical Rec #:  [id number]      Height:       67.0 in Accession #:    [id number]     Weight:       154.5 lb Date of Birth:  08-08-1940      BSA:          1.81 m Patient Age:    78 years       BP:           96/63 mmHg Patient Gender: M              HR:           94 bpm. Exam Location:  Inpatient  Procedure: 2D Echo Indications:    CHF-Acute Systolic 387.56 / E33.29  History:        Patient has prior history of Echocardiogram examinations, most                 recent 06/21/2019. Sepsis, Pulmonary embolism , Heart block AV                 complete , Right bundle branch block, STEMI involving left                 anterior descending coronary artery.  Sonographer:    Leavy Cella Referring Phys: [place]  1. LVEF is mildly decreased 45-50%. Mid septum to apex severe hypokinesis/akinesis consistent with prior LAD infarction. There is a moderate to large pericardial effusion with thrombus related to apical LV rupture. No direct communication is seen. Overall, this is similar in appearance to the study from 12/14.  2. Patient with known apical LV rupture. There is a moderate to large pericardial effusion which is thrombus due to LV apial rupture. There is no RV diastolic collapse and no respiratory variation in MV inflow. The IVC is remarkably collapsable. Clinical correlation for cardiac tamponade is recommended.  3. Mid and apical anterior septum, mid and apical inferior septum, and apex are abnormal.  4. The left ventricle demonstrates regional wall motion abnormalities.  5. Presence of pericardial fat pad.  6. The pericardial effusion is  circumferential.  7. Mild mitral annular calcification.  8. The tricuspid valve was grossly normal. Tricuspid valve regurgitation is trivial.  9. The inferior vena cava is normal in size with greater than 50% respiratory variability, suggesting right atrial pressure  of 3 mmHg. 10. The aortic valve was not well visualized. Aortic valve regurgitation is not visualized. 11. Left ventricular ejection fraction, by visual estimation, is 45 to 50%. The left ventricle has mildly decreased function. There is no left ventricular hypertrophy. 12. The mitral valve is grossly normal. Trivial mitral valve regurgitation. 13. Large pericardial effusion. 14. Global right ventricle has normal systolic function.The right ventricular size is normal. No increase in right ventricular wall thickness. 15. The tricuspid valve is grossly normal. Tricuspid valve regurgitation is trivial. FINDINGS  Left Ventricle: Left ventricular ejection fraction, by visual estimation, is 45 to 50%. The left ventricle has mildly decreased function. The left ventricle demonstrates regional wall motion abnormalities. LVEF is mildly decreased 45-50%. Mid septum to apex severe hypokinesis/akinesis consistent with prior LAD infarction. There is a moderate to large pericardial effusion with thrombus related to apical LV rupture. No direct communication is seen. Overall, this is similar in appearance to the study from  12/14.  LV Wall Scoring: The mid and apical anterior septum, mid and apical inferior septum, and apex are hypokinetic. Right Ventricle: The right ventricular size is normal. No increase in right ventricular wall thickness. Global RV systolic function is has normal systolic function. Left Atrium: Left atrial size was normal in size. Right Atrium: Right atrial size was normal in size. Right atrial pressure is estimated at 3 mmHg. Pericardium: A moderately sized pericardial effusion is present is seen. A large pericardial effusion is present. The  pericardial effusion is circumferential. Presence of pericardial fat pad. Patient with known apical LV rupture. There is a moderate to large pericardial effusion which is thrombus due to LV apial rupture. There is no RV diastolic collapse and no respiratory variation in MV inflow. The IVC is remarkably collapsable. Clinical correlation for cardiac tamponade is recommended. Mitral Valve: The mitral valve is degenerative in appearance. Mild mitral annular calcification. No evidence of mitral valve regurgitation. Tricuspid Valve: The tricuspid valve is grossly normal. Tricuspid valve regurgitation is trivial. Aortic Valve: The aortic valve was not well visualized. The aortic valve was not well visualized. Aorta: The aortic root is normal in size and structure. Venous: The inferior vena cava is normal in size with greater than 50% respiratory variability, suggesting right atrial pressure of 3 mmHg.  Eleonore Chiquito MD Electronically signed by Eleonore Chiquito MD Signature Date/Time: 06/23/2019/5:11:30 PMThe mitral valve is degenerative in appearance.    Final     Discharge Medications   Allergies as of 06/25/2019      Reactions   Ciprofloxacin Other (See Comments)   tendons hurt   Shrimp [shellfish Allergy]       Medication List    STOP taking these medications   amLODipine 2.5 MG tablet Commonly known as: NORVASC   pioglitazone 15 MG tablet Commonly known as: ACTOS   pravastatin 10 MG tablet Commonly known as: PRAVACHOL     TAKE these medications   acetaminophen 500 MG tablet Commonly known as: TYLENOL Take 500 mg by mouth every 6 (six) hours as needed (pain.).   albuterol 108 (90 Base) MCG/ACT inhaler Commonly known as: VENTOLIN HFA Inhale 2 puffs into the lungs every 4 (four) hours as needed for wheezing or shortness of breath.   allopurinol 100 MG tablet Commonly known as: ZYLOPRIM Take 100 mg by mouth daily.   aspirin EC 81 MG tablet Take 81 mg by mouth daily with supper.   B-12  2500 MCG Tabs Take 2,500 mcg by mouth daily.   carvedilol 3.125  MG tablet Commonly known as: COREG Take 1 tablet (3.125 mg total) by mouth 2 (two) times daily with a meal.   Cimzia 2 X 200 MG Kit Generic drug: Certolizumab Pegol Inject 400 mg into the skin every 28 (twenty-eight) days.   Combigan 0.2-0.5 % ophthalmic solution Generic drug: brimonidine-timolol Place 1 drop into both eyes 2 (two) times a day.   Dexamethasone Acetate 8 MG/ML Susp Inject 8 mg into the muscle daily as needed (for Crohn's flare).   ferrous sulfate 325 (65 FE) MG tablet Take 325 mg by mouth daily.   Fish Oil 1000 MG Caps Take 1,000 mg by mouth daily.   furosemide 40 MG tablet Commonly known as: LASIX Take 1 tablet (40 mg total) by mouth daily.   latanoprost 0.005 % ophthalmic solution Commonly known as: XALATAN Place 1 drop into both eyes at bedtime.   LORazepam 0.5 MG tablet Commonly known as: ATIVAN Take 0.5 mg by mouth daily as needed for anxiety.   losartan 25 MG tablet Commonly known as: COZAAR Take 0.5 tablets (12.5 mg total) by mouth daily.   mesalamine 500 MG CR capsule Commonly known as: PENTASA Take 2,000 mg by mouth 2 (two) times daily.   multivitamin with minerals Tabs tablet Take 1 tablet by mouth daily.   pantoprazole 40 MG tablet Commonly known as: PROTONIX Take 40 mg by mouth daily in the afternoon.   predniSONE 10 MG tablet Commonly known as: DELTASONE Take 20 mg by mouth daily after breakfast. Total daily dose=22.5 mg (take with 0.5 tablet 15m tablet)   predniSONE 5 MG tablet Commonly known as: DELTASONE Take 2.5 mg by mouth daily. Total daily dose=22.5 mg (take with #2-10 mg tablets)   rosuvastatin 40 MG tablet Commonly known as: CRESTOR Take 1 tablet (40 mg total) by mouth daily at 6 PM.   spironolactone 25 MG tablet Commonly known as: ALDACTONE Take 0.5 tablets (12.5 mg total) by mouth daily.   ticagrelor 90 MG Tabs tablet Commonly known as:  BRILINTA Take 1 tablet (90 mg total) by mouth 2 (two) times daily.   traMADol 50 MG tablet Commonly known as: ULTRAM Take 1 tablet (50 mg total) by mouth at bedtime as needed for moderate pain. What changed: when to take this   Vitamin D 50 MCG (2000 UT) tablet Take 2,000 Units by mouth daily.            Durable Medical Equipment  (From admission, onward)         Start     Ordered   06/24/19 1430  Heart failure home health orders  (Heart failure home health orders / Face to face)  Once    Comments: Heart Failure Follow-up Care:  Verify follow-up appointments per Patient Discharge Instructions. Confirm transportation arranged. Reconcile home medications with discharge medication list. Remove discontinued medications from use. Assist patient/caregiver to manage medications using pill box. Reinforce low sodium food selection Assessments: Vital signs and oxygen saturation at each visit. Assess home environment for safety concerns, caregiver support and availability of low-sodium foods. Consult SEducation officer, museum PT/OT, Dietitian, and CNA based on assessments. Perform comprehensive cardiopulmonary assessment. Notify MD for any change in condition or weight gain of 3 pounds in one day or 5 pounds in one week with symptoms. Daily Weights and Symptom Monitoring: Ensure patient has access to scales. Teach patient/caregiver to weigh daily before breakfast and after voiding using same scale and record.    Teach patient/caregiver to track weight and symptoms and when  to notify Provider. Activity: Develop individualized activity plan with patient/caregiver.   Need HHRN HHPT [place]  Question Answer Comment  Heart Failure Follow-up Care Advanced Heart Failure (AHF) Clinic at [phone]   Lab frequency Other see comments   Fax lab results to Other see comments   Diet Low Sodium Heart Healthy   Fluid restrictions: 2000 mL Fluid      06/24/19 1430          Disposition   The patient  will be discharged in stable condition to home. Discharge Instructions    (HEART FAILURE PATIENTS) Call MD:  Anytime you have any of the following symptoms: 1) 3 pound weight gain in 24 hours or 5 pounds in 1 week 2) shortness of breath, with or without a dry hacking cough 3) swelling in the hands, feet or stomach 4) if you have to sleep on extra pillows at night in order to breathe.   Complete by: As directed    Diet - low sodium heart healthy   Complete by: As directed    Heart Failure patients record your daily weight using the same scale at the same time of day   Complete by: As directed    Increase activity slowly   Complete by: As directed      Follow-up Information    [facility] Follow up.   Specialty: Home Health Services Why: RN, PT, OT Contact information: [place] [address] [phone]        Larey Dresser, MD Follow up on 07/07/2019.   Specialty: Cardiology Why: 09:40 VZSMOL [id number]  Contact information: [address] [place] [address] [phone]             Duration of Discharge Encounter: Greater than 35 minutes   Signed, Darrick Grinder NP-C  06/25/2019, 10:58 AM

## 2019-06-25 NOTE — Progress Notes (Signed)
Per Dr Aundra Dubin pt needs echo with his post hosp f/u appt to reassess VSD, apical perforation, and pericardial effusion. Order placed and echo sch.

## 2019-06-25 NOTE — Progress Notes (Addendum)
Patient ID: Rodney Sullivan, male   DOB: [birth year], 78 y.o.   MRN: [id number]     Advanced Heart Failure Rounding Note  PCP-Cardiologist: Peter Martinique, MD  AHF: Dr. Aundra Dubin   Subjective:    Admitted for acute anterior MI c/b cardiogenic shock and transient CHB (resolved). S/p DES to LAD. EF ~35-40%. Also w/ mechanical complication w/ infarct-related VSD in the mid-apical interventricular septum w/ small LV apical rupture communicating with the pericardial space, confirmed by TEE. Unfortunately felt to be a poor surgical candidate. Treating conservatively.   Dobutamine discontinued 12/15.   Feeling ok. Wants to go home.    Objective:   Weight Range: 67.8 kg Body mass index is 23.41 kg/m.   Vital Signs:   Temp:  [97.5 F (36.4 C)-98.6 F (37 C)] 97.8 F (36.6 C) (12/18 0748) Pulse Rate:  [83-105] 105 (12/18 0748) Resp:  [13-19] 15 (12/18 0748) BP: (89-125)/(59-88) 125/88 (12/18 0748) SpO2:  [97 %-99 %] 99 % (12/18 0748) Weight:  [67.8 kg] 67.8 kg (12/18 0608) Last BM Date: 06/24/19  Weight change: Filed Weights   06/23/19 0556 06/24/19 0319 06/25/19 4128  Weight: 70.1 kg 68.7 kg 67.8 kg    Intake/Output:   Intake/Output Summary (Last 24 hours) at 06/25/2019 0959 Last data filed at 06/24/2019 2302 Gross per 24 hour  Intake 440 ml  Output 500 ml  Net -60 ml      Physical Exam  General:  No resp difficulty HEENT: normal Neck: supple. no JVD. Carotids 2+ bilat; no bruits. No lymphadenopathy or thryomegaly appreciated. LIJ introducer.  Cor: PMI nondisplaced. Regular rate & rhythm. No rubs, gallops or murmurs. Lungs: clear Abdomen: soft, nontender, nondistended. No hepatosplenomegaly. No bruits or masses. Good bowel sounds. Extremities: no cyanosis, clubbing, rash, edema. LBKA.  Neuro: alert & orientedx3, cranial nerves grossly intact. moves all 4 extremities w/o difficulty. Affect pleasant    Telemetry  Sinus Tach 100s.    EKG    No new EKG to review  today  Labs    CBC Recent Labs    06/24/19 0214 06/25/19 0648  WBC 12.1* 11.8*  NEUTROABS 7.8* 7.5  HGB 11.2* 10.9*  HCT 37.5* 36.3*  MCV 85.6 85.4  PLT 268 786   Basic Metabolic Panel Recent Labs    06/24/19 0214 06/25/19 0648  NA 140 141  K 4.2 4.2  CL 106 104  CO2 22 24  GLUCOSE 200* 125*  BUN 17 18  CREATININE 1.02 1.05  CALCIUM 8.8* 8.8*   Liver Function Tests No results for input(s): AST, ALT, ALKPHOS, BILITOT, PROT, ALBUMIN in the last 72 hours. No results for input(s): LIPASE, AMYLASE in the last 72 hours. Cardiac Enzymes No results for input(s): CKTOTAL, CKMB, CKMBINDEX, TROPONINI in the last 72 hours.  BNP: BNP (last 3 results) Recent Labs    06/19/19 0356  BNP 268.9*    ProBNP (last 3 results) No results for input(s): PROBNP in the last 8760 hours.   D-Dimer No results for input(s): DDIMER in the last 72 hours. Hemoglobin A1C No results for input(s): HGBA1C in the last 72 hours. Fasting Lipid Panel No results for input(s): CHOL, HDL, LDLCALC, TRIG, CHOLHDL, LDLDIRECT in the last 72 hours. Thyroid Function Tests No results for input(s): TSH, T4TOTAL, T3FREE, THYROIDAB in the last 72 hours.  Invalid input(s): FREET3  Other results:   Imaging    No results found.   Medications:     Scheduled Medications: . allopurinol  100 mg Oral Daily  .  aspirin EC  81 mg Oral Q supper  . brimonidine  1 drop Both Eyes Q12H   And  . timolol  1 drop Both Eyes Q12H  . carvedilol  3.125 mg Oral BID WC  . Chlorhexidine Gluconate Cloth  6 each Topical Daily  . cholecalciferol  2,000 Units Oral Daily  . ferrous sulfate  325 mg Oral Daily  . furosemide  40 mg Oral Daily  . insulin aspart  0-5 Units Subcutaneous QHS  . insulin aspart  0-9 Units Subcutaneous TID WC  . latanoprost  1 drop Both Eyes QHS  . losartan  12.5 mg Oral Daily  . mesalamine  2,000 mg Oral BID  . multivitamin with minerals  1 tablet Oral Daily  . omega-3 acid ethyl esters  1  g Oral Daily  . pantoprazole  40 mg Oral Q1500  . predniSONE  22.5 mg Oral Q breakfast  . rosuvastatin  40 mg Oral q1800  . sodium chloride flush  10-40 mL Intracatheter Q12H  . sodium chloride flush  3 mL Intravenous Q12H  . spironolactone  12.5 mg Oral Daily  . ticagrelor  90 mg Oral BID  . vitamin B-12  2,000 mcg Oral Daily    Infusions: . sodium chloride 10 mL/hr at 06/22/19 0800  . sodium chloride      PRN Medications: sodium chloride, sodium chloride, acetaminophen, albuterol, ALPRAZolam, LORazepam, nitroGLYCERIN, ondansetron (ZOFRAN) IV, sodium chloride flush, sodium chloride flush, traMADol, zolpidem    Patient Profile   78 y.o male with history of extensive PAD, type 2 diabetes, prior PE/DVT, and Crohns disease was admitted with anterior MI. Emergent cath on 12/11 showed acutely occluded mLAD and CTO D1.  LAD was opened with 2 DES but there was no-reflow.  On 12/12, he developed complete heart block and temporary transvenous pacer was placed. RHC showed low output (CI 1.99 Fick) but PCWP 11. Initial co-ox 52% and started on dobutamine. Echo w/ apical and peri-apical severe akinesis, EF ~ 35%, normal RV function. Also w/ infarct-related VSD in the mid-apical interventricular septum w/ small LV apical rupture communicating with the pericardial space, confirmed by TEE. Unfortunately felt to be a poor surgical candidate. Treating conservatively.   Assessment/Plan   1. CAD: Acute anteroseptal MI, somewhat delayed presentation.  Cath with acutely occluded mLAD and CTO D1.  DES x 2 to LAD, but no-reflow.  - Continue ASA 81 and ticagrelor 90 bid.  - Atorvastatin 80 mg daily.  - Tolerating Coreg without bradyarrhythmias.  2. Acute systolic CHF: Ischemic cardiomyopathy.  EF ~ 35% initially with apical/peri-apical severe hypokinesis to akinesis. RHC showed low output (CI 1.99 Fick). Started on dobutamine 12/13. Initial co-ox, 52%. Dobutamine discontinued 12/15. Repeat limited echo  EF  45%.   - Volume status stable. Continue lasix 40 mg po daily.  - Continue losartan 12.5 mg daily. - Continue spironolactone 12.5 daily.  - Continue Coreg 3.125 mg bid.  3. VSD + Small Apical Rupture: mechanical complication of acute MI. Unfortunately not a surgical candidate. Treating conservatively. Viewed limited echo from12/16, pericardial effusion seems stable (moderate) without tamponade, cannot see perforation with active flow on this echo, ?if walled off.  -  If apical perforation walls off and he remains stable, could consider attempt at percutaneous treatment of VSD with closure device => reviewed with Dr. Burt Knack, will reassess probably with TEE and RHC + LVgram views in a couple of weeks to see if this would be feasible.  3. Diabetes: pioglitazone discontinued due  to CHF. Plan eventual addition of an SGLT2i 4. PAD: s/p left BKA, nonhealing ulcer on right.  5. Crohns disease: Has been on chronic prednisone, continued here.  Higher risk of mechanical complication of MI with prednisone.  6. Complete heart block: Transient complication of anterior MI with no-reflow. No recurrent HB. Normal conduction. Temp pacer removed 12/15 and rhythm stable.  He has tolerated Coreg.  7. Mobilize with PT. Followed by Alvis Lemmings.   Follow up with Dr Aundra Dubin  [facility] PT/OT/RN - Alvis Lemmings will resume at discharge.   Gold DNR form completed at his wifes request. HF Follow has been set up 12/30 at 0/9:40   Length of Stay: [place], NP  06/25/2019, 9:59 AM  Advanced Heart Failure Team Pager [phone] (M-F; [place])  Please contact [facility] for night-coverage after hours (4p -7a ) and weekends on [email or web address]  Patient seen with NP, agree with the above note.   He is stable this morning, no chest pain or dyspnea.   On exam, no volume overload.   I am going to let him go home on the current medical regimen.  He will followup with me on 12/30, will get echo prior to his visit to reassess VSD, apical  perforation, and pericardial effusion.  If he develops CHF from VSD, percutaneous closure may be option (if apical perforation walls off).   Loralie Champagne 06/25/2019 11:03 AM

## 2019-07-05 ENCOUNTER — Telehealth (HOSPITAL_COMMUNITY): Payer: Self-pay | Admitting: *Deleted

## 2019-07-05 NOTE — Telephone Encounter (Signed)
pts wife left VM stating pt has had labored breathing for 2 days, he doesn't have " much color",  Blood pressure 97/56. Pt has an echo and appt w/Dr.McLean on Wednesday. Pts wife wants to know if there is anything pt needs to do before his appt (medication change? got to the ED?)  Routed to [place] for advice

## 2019-07-05 NOTE — Telephone Encounter (Signed)
Spoke with patients wife she is aware and agreeable with plan.

## 2019-07-05 NOTE — Telephone Encounter (Signed)
Increase lasix to 40 mg bid until he sees me.

## 2019-07-07 ENCOUNTER — Encounter (HOSPITAL_COMMUNITY): Payer: Self-pay | Admitting: Cardiology

## 2019-07-07 ENCOUNTER — Other Ambulatory Visit: Payer: Self-pay

## 2019-07-07 ENCOUNTER — Telehealth (HOSPITAL_COMMUNITY): Payer: Self-pay | Admitting: Cardiology

## 2019-07-07 ENCOUNTER — Encounter (HOSPITAL_COMMUNITY): Payer: Medicare HMO | Admitting: Cardiology

## 2019-07-07 ENCOUNTER — Ambulatory Visit (HOSPITAL_COMMUNITY)
Admission: RE | Admit: 2019-07-07 | Discharge: 2019-07-07 | Disposition: A | Discharge status: Home / Self Care | Payer: Medicare HMO | Source: Ambulatory Visit | Attending: Cardiology | Admitting: Cardiology

## 2019-07-07 ENCOUNTER — Ambulatory Visit (HOSPITAL_BASED_OUTPATIENT_CLINIC_OR_DEPARTMENT_OTHER)
Admission: RE | Admit: 2019-07-07 | Discharge: 2019-07-07 | Disposition: A | Discharge status: Home / Self Care | Payer: Medicare HMO | Source: Ambulatory Visit | Attending: Cardiology | Admitting: Cardiology

## 2019-07-07 VITALS — BP 92/60 | HR 95

## 2019-07-07 DIAGNOSIS — Z955 Presence of coronary angioplasty implant and graft: Secondary | ICD-10-CM | POA: Diagnosis not present

## 2019-07-07 DIAGNOSIS — E1151 Type 2 diabetes mellitus with diabetic peripheral angiopathy without gangrene: Secondary | ICD-10-CM | POA: Insufficient documentation

## 2019-07-07 DIAGNOSIS — Z7902 Long term (current) use of antithrombotics/antiplatelets: Secondary | ICD-10-CM | POA: Insufficient documentation

## 2019-07-07 DIAGNOSIS — Q210 Ventricular septal defect: Secondary | ICD-10-CM | POA: Diagnosis not present

## 2019-07-07 DIAGNOSIS — I313 Pericardial effusion (noninflammatory): Secondary | ICD-10-CM

## 2019-07-07 DIAGNOSIS — Z89512 Acquired absence of left leg below knee: Secondary | ICD-10-CM | POA: Diagnosis not present

## 2019-07-07 DIAGNOSIS — Z86718 Personal history of other venous thrombosis and embolism: Secondary | ICD-10-CM | POA: Diagnosis not present

## 2019-07-07 DIAGNOSIS — Z79899 Other long term (current) drug therapy: Secondary | ICD-10-CM | POA: Diagnosis not present

## 2019-07-07 DIAGNOSIS — E119 Type 2 diabetes mellitus without complications: Secondary | ICD-10-CM | POA: Insufficient documentation

## 2019-07-07 DIAGNOSIS — Z86711 Personal history of pulmonary embolism: Secondary | ICD-10-CM | POA: Diagnosis not present

## 2019-07-07 DIAGNOSIS — I255 Ischemic cardiomyopathy: Secondary | ICD-10-CM | POA: Insufficient documentation

## 2019-07-07 DIAGNOSIS — Z7952 Long term (current) use of systemic steroids: Secondary | ICD-10-CM | POA: Diagnosis not present

## 2019-07-07 DIAGNOSIS — K5090 Crohn's disease, unspecified, without complications: Secondary | ICD-10-CM | POA: Insufficient documentation

## 2019-07-07 DIAGNOSIS — I3139 Other pericardial effusion (noninflammatory): Secondary | ICD-10-CM

## 2019-07-07 DIAGNOSIS — I442 Atrioventricular block, complete: Secondary | ICD-10-CM | POA: Insufficient documentation

## 2019-07-07 DIAGNOSIS — I252 Old myocardial infarction: Secondary | ICD-10-CM | POA: Diagnosis not present

## 2019-07-07 DIAGNOSIS — Z87442 Personal history of urinary calculi: Secondary | ICD-10-CM | POA: Insufficient documentation

## 2019-07-07 DIAGNOSIS — Z7982 Long term (current) use of aspirin: Secondary | ICD-10-CM | POA: Insufficient documentation

## 2019-07-07 DIAGNOSIS — I5022 Chronic systolic (congestive) heart failure: Secondary | ICD-10-CM | POA: Insufficient documentation

## 2019-07-07 DIAGNOSIS — Z8249 Family history of ischemic heart disease and other diseases of the circulatory system: Secondary | ICD-10-CM | POA: Diagnosis not present

## 2019-07-07 DIAGNOSIS — Z87891 Personal history of nicotine dependence: Secondary | ICD-10-CM | POA: Insufficient documentation

## 2019-07-07 DIAGNOSIS — I2510 Atherosclerotic heart disease of native coronary artery without angina pectoris: Secondary | ICD-10-CM | POA: Diagnosis not present

## 2019-07-07 LAB — CBC
HCT: 37.5 % — ABNORMAL LOW (ref 39.0–52.0)
Hemoglobin: 11.3 g/dL — ABNORMAL LOW (ref 13.0–17.0)
MCH: 25.7 pg — ABNORMAL LOW (ref 26.0–34.0)
MCHC: 30.1 g/dL (ref 30.0–36.0)
MCV: 85.2 fL (ref 80.0–100.0)
Platelets: 307 10*3/uL (ref 150–400)
RBC: 4.4 MIL/uL (ref 4.22–5.81)
RDW: 16.1 % — ABNORMAL HIGH (ref 11.5–15.5)
WBC: 20.9 10*3/uL — ABNORMAL HIGH (ref 4.0–10.5)
nRBC: 0.1 % (ref 0.0–0.2)

## 2019-07-07 LAB — BASIC METABOLIC PANEL
Anion gap: 12 (ref 5–15)
BUN: 47 mg/dL — ABNORMAL HIGH (ref 8–23)
CO2: 24 mmol/L (ref 22–32)
Calcium: 8.8 mg/dL — ABNORMAL LOW (ref 8.9–10.3)
Chloride: 102 mmol/L (ref 98–111)
Creatinine, Ser: 2.29 mg/dL — ABNORMAL HIGH (ref 0.61–1.24)
GFR calc Af Amer: 31 mL/min — ABNORMAL LOW (ref >60)
GFR calc non Af Amer: 27 mL/min — ABNORMAL LOW (ref >60)
Glucose, Bld: 258 mg/dL — ABNORMAL HIGH (ref 70–99)
Potassium: 3.7 mmol/L (ref 3.5–5.1)
Sodium: 138 mmol/L (ref 135–145)

## 2019-07-07 LAB — BRAIN NATRIURETIC PEPTIDE: B Natriuretic Peptide: 207.8 pg/mL — ABNORMAL HIGH (ref 0.0–100.0)

## 2019-07-07 MED ORDER — FUROSEMIDE 40 MG PO TABS: 40.0000 mg | ORAL_TABLET | Freq: Every day | ORAL | 5 refills | Status: DC

## 2019-07-07 MED ORDER — LOSARTAN POTASSIUM 25 MG PO TABS: 12.5000 mg | ORAL_TABLET | Freq: Every evening | ORAL | 6 refills | Status: DC

## 2019-07-07 MED ORDER — SPIRONOLACTONE 25 MG PO TABS: 12.5000 mg | ORAL_TABLET | Freq: Every evening | ORAL | 6 refills | Status: AC

## 2019-07-07 NOTE — Progress Notes (Signed)
PCP: Emelia [facility]on [facility] Cardiology: Dr. Aundra Dubin  78 y.o. with history of extensive PAD, type 2 diabetes, prior PE/DVT, and Crohns disease was admitted in 12/20 with anterior MI.   Patient has extensive PAD, s/p left BKA with recent unsuccessful intervention on CTO right PT, has non-healing right foot wound.  He has Crohns disease on chronic prednisone.   Patient developed chest pain 06/18/19 while in the shower, went to see PCP.  ECG showed RBBB with anterior MI, he was sent to ER and subsequently take for emergent cath.  Cath showed acutely occluded mLAD and CTO D1.  LAD was opened with 2 DES but there was no-reflow.  No prior cardiac history.   On 06/19/19, he developed complete heart block and temporary transvenous pacer was placed.  RHC showed low output (CI 1.99 Fick) but PCWP 11.  CHB subsequently resolved without PPM, he now has RBBB and LAFB.  He was noted on TEE post-intervention to have infarct-related VSD (serpiginous) in the mid-apical interventricular septum and small apical rupture communicating with pericardial space.  There was a moderate pericardial effusion with hematoma but no tamponade. He recovered well in the hospital without significant CHF symptoms.  He was discharged home.   He returns today for followup of CAD, CHF.  He called the office earlier this week with dyspnea and weakness, he was told to increase Lasix to 40 mg bid.  He still feels generally weak.  He does not walk and transfers from bed to wheelchair.  He is short of breath with transfers (was not short of breath with transfers prior to MI).  No orthopnea/PND.  No lightheadedness though SBP running in the 90s when he checks at home.  He has been afebrile.  No cough.  He fell today getting to the toilet, mechanical fall and developed bruising on the right side of his abdomen.    REDS clip 31%  ECG (personally reviewed): NSR, RBBB, LAFB  Labs (12/20): K 4.2, creatinine 1.05 => 2.29   PMH: 1. E coli  bacteremia 5/15.  2. Crohns disease: On chronic prednisone and mesalamine.  3. H/o DVT and PE in 5/15.  4. Nephrolithiasis 5. Type 2 diabetes 6. PAD: s/p left BKA with unsuccessful intervention on CTO right PT.  7. CAD: Anterior STEMI in 12/20 => cath with acutely occluded mid LAD and CTO D1. LAD opened with DES x 2 but no-reflow.  - Complicated by CHB that resolved.  - Complicated by VSD and small apical rupture.  TEE 12/20 showed EF 35-40% with akinetic apex and peri-apical segments.  There was infarct-related VSD in the mid-apical interventricular septum. Suspect small LV apical rupture communicating with the pericardial space. Moderate pericardial effusion with hematoma, no tamponade.  - Echo (07/07/19): EF 50-55%, apical septal and apical akinesis, normal RV, no VSD seen, no apical rupture visualized.  There is still a moderate organized pericardial effusion without tamponade.   Social History   Socioeconomic History  . Marital status: Married    Spouse name: Not on file  . Number of children: Not on file  . Years of education: Not on file  . Highest education level: Not on file  Occupational History  . Not on file  Tobacco Use  . Smoking status: Former Smoker    Years: 10.00    Types: Pipe  . Smokeless tobacco: Former Systems developer    Quit date: 12/08/1979  Substance and Sexual Activity  . Alcohol use: No    Alcohol/week: 0.0 standard drinks  .  Drug use: No  . Sexual activity: Not Currently  Other Topics Concern  . Not on file  Social History Narrative  . Not on file   Social Determinants of Health   Financial Resource Strain:   . Difficulty of Paying Living Expenses: Not on file  Food Insecurity:   . Worried About Charity fundraiser in the Last Year: Not on file  . Ran Out of Food in the Last Year: Not on file  Transportation Needs:   . Lack of Transportation (Medical): Not on file  . Lack of Transportation (Non-Medical): Not on file  Physical Activity:   . Days of  Exercise per Week: Not on file  . Minutes of Exercise per Session: Not on file  Stress:   . Feeling of Stress : Not on file  Social Connections:   . Frequency of Communication with Friends and Family: Not on file  . Frequency of Social Gatherings with Friends and Family: Not on file  . Attends Religious Services: Not on file  . Active Member of Clubs or Organizations: Not on file  . Attends Archivist Meetings: Not on file  . Marital Status: Not on file  Intimate Partner Violence:   . Fear of Current or Ex-Partner: Not on file  . Emotionally Abused: Not on file  . Physically Abused: Not on file  . Sexually Abused: Not on file   Family History  Problem Relation Age of Onset  . Hypertension Mother    ROS: All systems reviewed and negative except as per HPI.   Current Outpatient Medications  Medication Sig Dispense Refill  . acetaminophen (TYLENOL) 500 MG tablet Take 500 mg by mouth every 6 (six) hours as needed (pain.).     Marland Kitchen albuterol (VENTOLIN HFA) 108 (90 Base) MCG/ACT inhaler Inhale 2 puffs into the lungs every 4 (four) hours as needed for wheezing or shortness of breath.     . allopurinol (ZYLOPRIM) 100 MG tablet Take 100 mg by mouth daily.    Marland Kitchen aspirin EC 81 MG tablet Take 81 mg by mouth daily with supper.     . brimonidine-timolol (COMBIGAN) 0.2-0.5 % ophthalmic solution Place 1 drop into both eyes 2 (two) times a day.    . carvedilol (COREG) 3.125 MG tablet Take 1 tablet (3.125 mg total) by mouth 2 (two) times daily with a meal. 60 tablet 6  . Certolizumab Pegol (CIMZIA) 2 X 200 MG KIT Inject 400 mg into the skin every 28 (twenty-eight) days.    . Cholecalciferol (VITAMIN D) 50 MCG (2000 UT) tablet Take 2,000 Units by mouth daily.    . Cyanocobalamin (B-12) 2500 MCG TABS Take 2,500 mcg by mouth daily.    Marland Kitchen Dexamethasone Acetate 8 MG/ML SUSP Inject 8 mg into the muscle daily as needed (for Crohn's flare).    . ferrous sulfate 325 (65 FE) MG tablet Take 325 mg by  mouth daily.    . furosemide (LASIX) 40 MG tablet Take 1 tablet (40 mg total) by mouth daily. 30 tablet 5  . latanoprost (XALATAN) 0.005 % ophthalmic solution Place 1 drop into both eyes at bedtime.    Marland Kitchen LORazepam (ATIVAN) 0.5 MG tablet Take 0.5 mg by mouth daily as needed for anxiety.    Marland Kitchen losartan (COZAAR) 25 MG tablet Take 0.5 tablets (12.5 mg total) by mouth every evening. 30 tablet 6  . mesalamine (PENTASA) 500 MG CR capsule Take 2,000 mg by mouth 2 (two) times daily.    Marland Kitchen  Multiple Vitamin (MULTIVITAMIN WITH MINERALS) TABS Take 1 tablet by mouth daily.    . Omega-3 Fatty Acids (FISH OIL) 1000 MG CAPS Take 1,000 mg by mouth daily.    . pantoprazole (PROTONIX) 40 MG tablet Take 40 mg by mouth daily in the afternoon.     . predniSONE (DELTASONE) 10 MG tablet Take 20 mg by mouth daily after breakfast. Total daily dose=22.5 mg (take with 0.5 tablet 107m tablet)    . predniSONE (DELTASONE) 5 MG tablet Take 2.5 mg by mouth daily. Total daily dose=22.5 mg (take with #2-10 mg tablets)    . rosuvastatin (CRESTOR) 40 MG tablet Take 1 tablet (40 mg total) by mouth daily at 6 PM. 30 tablet 6  . spironolactone (ALDACTONE) 25 MG tablet Take 0.5 tablets (12.5 mg total) by mouth every evening. 30 tablet 6  . ticagrelor (BRILINTA) 90 MG TABS tablet Take 1 tablet (90 mg total) by mouth 2 (two) times daily. 60 tablet 6  . traMADol (ULTRAM) 50 MG tablet Take 1 tablet (50 mg total) by mouth at bedtime as needed for moderate pain. (Patient taking differently: Take 50 mg by mouth every 6 (six) hours as needed for moderate pain. )     No current facility-administered medications for this encounter.   BP 92/60   Pulse 95   SpO2 98%  General: NAD Neck: Thick, no JVD, no thyromegaly or thyroid nodule.  Lungs: Clear to auscultation bilaterally with normal respiratory effort. CV: Nondisplaced PMI.  Heart regular S1/S2, no S3/S4, 1/6 SEM RUSB.  No peripheral edema.  No carotid bruit.  Unable to palpate left pedal  pulses.  Abdomen: Soft, nontender, no hepatosplenomegaly, no distention.  Skin: Intact without lesions or rashes.  Neurologic: Alert and oriented x 3.  Psych: Normal affect. Extremities: No clubbing or cyanosis. S/p left BKA.  HEENT: Normal.   Assessment/Plan: 1. CAD: Acute anteroseptal MI in 12/20, somewhat delayed presentation. Cath with acutely occluded mLAD and CTO D1. DES x 2 to LAD, but no-reflow.  - Continue ASA 81 and ticagrelor 90 bid.  - Atorvastatin 80 mg daily.  - Tolerating Coreg without bradyarrhythmias.  2. Chronic systolic CHF: Ischemic cardiomyopathy. EF ~ 35% initially with apical/peri-apical severe hypokinesis to akinesis. RHC showed low output (CI 1.99 Fick). Repeat limited echo with EF 45%.  On echo done in the office today, EF up to 50-55% with apical and apical septal akinesis. He is not volume overloaded by exam or REDS clip though he has NYHA class III symptoms.  The weakness that he is feeling may be related to low BP/hypovolemia given rise in creatinine to 2.29.  - Stop Lasix for 3 days, resume at 20 mg daily.  BMET 1 week.   - Stop losartan (given PAD, wonder if he has bilateral renal artery stenosis as cause of AKI, would consider renal artery dopplers).  - Continue spironolactone 12.5 daily for now.  - Continue Coreg 3.125 mg bid.  3. VSD + Small Apical Rupture: Shown by TEE in 12/20.  Mechanical complications of acute MI. He was not a surgical candidate, treated conservatively.  Echo today was reviewed => I cannot see residual VSD or apical rupture though he still has an organized moderate pericardial effusion without tamponade.  He does not have a prominent murmur.   4. Diabetes: pioglitazone discontinued due to CHF. Would recommend initiation of Farxiga or Jardiance, glucose has been higher recently.  He wants to discuss this with his PCP.  5. PAD: s/p left BKA,  nonhealing ulcer on right.  - Continue statin.  6. Crohns disease: Has been on chronic prednisone.   7. Complete heart block: Transient complication of anterior MI with no-reflow in 12/20. No lightheadedness/syncope.  Tolerating low dose Coreg. Echo today with LAFB/RBBB.  8. Pericardial effusion: I think that this was related to small apical rupture which seems to have walled off.  He has a stable moderate organized pericardial effusion, no evidence for tamponade or effusive/constrictive pericarditis by echo.  - Repeat echo in 1 month to follow pericardial effusion (make sure he is not developing constriction).   Followup in 1 month with echo.   Loralie Champagne 07/07/2019

## 2019-07-07 NOTE — Progress Notes (Signed)
  Echocardiogram 2D Echocardiogram has been performed.  Darlina Sicilian M 07/07/2019, 12:10 PM

## 2019-07-07 NOTE — Progress Notes (Signed)
ReDS Vest / Clip - 07/07/19 1200      ReDS Vest / Clip   Station Marker  C    Ruler Value  28    ReDS Value Range  Low volume    ReDS Actual Value  31    Anatomical Comments  sitting

## 2019-07-07 NOTE — Telephone Encounter (Signed)
Patients wife called to report fall this morning, reports patient fell in the bathroom and hit the side of the bath tub at R lower abdomen area. Mild pain and tenderness at site with some now bruising. Patients wife requested a xray prior to OV at [facility].  Advised would report to Dr Aundra Dubin and return call as soon as possible.

## 2019-07-07 NOTE — Telephone Encounter (Signed)
Per Dr Aundra Dubin if hematoma/bruise is forming will need to be evaluated in the ER  If patient is without pain he may come in for his echo and then report to ER  Patients wife aware and reports patient is not in pain at all, b/p 120/80, denies dizziness or SOB, with recent amputation on the L,  R leg gave away due to weakness. Will have patient come in for echo and then to er

## 2019-07-07 NOTE — Patient Instructions (Addendum)
DECREASE Furosemide to 25m (1 tab) daily   CHANGE TIME YOU TAKE Losartan and Spironolactone.  Start taking them in the evenings   Ok to start Physical Therapy!    Labs today We will only contact you if something comes back abnormal or we need to make some changes. Otherwise no news is good news!    Your physician has requested that you have an echocardiogram. Echocardiography is a painless test that uses sound waves to create images of your heart. It provides your doctor with information about the size and shape of your heart and how well your heart's chambers and valves are working. This procedure takes approximately one hour. There are no restrictions for this procedure.   Your physician recommends that you schedule a follow-up appointment in: 1 month- January 29th, 2021 at 2pm for ECHO and 3pm with Dr MAundra Dubin Garage code [id number]   At [facility] you and your health needs are our priority. As part of our continuing mission to provide you with exceptional heart care, we have created designated Provider Care Teams. These Care Teams include your primary Cardiologist (physician) and Advanced Practice Providers (APPs- Physician Assistants and Nurse Practitioners) who all work together to provide you with the care you need, when you need it.   You may see any of the following providers on your designated Care Team at your next follow up: .Marland KitchenDr DGlori Bickers. Dr DLoralie Champagne. ADarrick Grinder NP . BLyda Jester PA . LAudry Riles PharmD   Please be sure to bring in all your medications bottles to every appointment.

## 2019-07-08 ENCOUNTER — Encounter (HOSPITAL_COMMUNITY): Payer: Medicare HMO | Admitting: Cardiology

## 2019-07-08 ENCOUNTER — Telehealth (HOSPITAL_COMMUNITY): Payer: Self-pay

## 2019-07-08 DIAGNOSIS — D72828 Other elevated white blood cell count: Secondary | ICD-10-CM

## 2019-07-08 DIAGNOSIS — I5022 Chronic systolic (congestive) heart failure: Secondary | ICD-10-CM

## 2019-07-08 MED ORDER — FUROSEMIDE 40 MG PO TABS: 20.0000 mg | ORAL_TABLET | Freq: Every day | ORAL | 5 refills | Status: AC

## 2019-07-08 NOTE — Progress Notes (Signed)
Patient advised and verbalized understanding,lab ordered and entered. See lab result

## 2019-07-08 NOTE — Telephone Encounter (Signed)
-----   Message from Larey Dresser, MD sent at 07/07/2019  9:25 PM EST ----- Glucose high, creatinine high, WBCs high.  1. Discuss new diabetes med with PCP.  Suggest [place] or [place].  2. Needs workup of leucocytosis.  CXR PA/lateral, urinalysis.  Close followup with PCP.  3. Stop Lasix x 3 days, resume at 20 mg daily after that.   4. Stop losartan

## 2019-07-12 ENCOUNTER — Ambulatory Visit (HOSPITAL_COMMUNITY)
Admission: RE | Admit: 2019-07-12 | Discharge: 2019-07-12 | Disposition: A | Discharge status: Home / Self Care | Payer: Medicare HMO | Source: Ambulatory Visit | Attending: Cardiology | Admitting: Cardiology

## 2019-07-12 ENCOUNTER — Other Ambulatory Visit: Payer: Self-pay

## 2019-07-12 DIAGNOSIS — I5022 Chronic systolic (congestive) heart failure: Secondary | ICD-10-CM

## 2019-07-12 DIAGNOSIS — I700 Atherosclerosis of aorta: Secondary | ICD-10-CM | POA: Insufficient documentation

## 2019-07-12 DIAGNOSIS — D72828 Other elevated white blood cell count: Secondary | ICD-10-CM | POA: Diagnosis present

## 2019-07-12 LAB — BASIC METABOLIC PANEL
Anion gap: 11 (ref 5–15)
BUN: 29 mg/dL — ABNORMAL HIGH (ref 8–23)
CO2: 22 mmol/L (ref 22–32)
Calcium: 9.1 mg/dL (ref 8.9–10.3)
Chloride: 109 mmol/L (ref 98–111)
Creatinine, Ser: 1.37 mg/dL — ABNORMAL HIGH (ref 0.61–1.24)
GFR calc Af Amer: 58 mL/min — ABNORMAL LOW (ref >60)
GFR calc non Af Amer: 50 mL/min — ABNORMAL LOW (ref >60)
Glucose, Bld: 229 mg/dL — ABNORMAL HIGH (ref 70–99)
Potassium: 3.9 mmol/L (ref 3.5–5.1)
Sodium: 142 mmol/L (ref 135–145)

## 2019-07-13 ENCOUNTER — Ambulatory Visit: Payer: Medicare HMO | Admitting: Vascular Surgery

## 2019-07-13 ENCOUNTER — Encounter: Payer: Self-pay | Admitting: Vascular Surgery

## 2019-07-13 ENCOUNTER — Ambulatory Visit (INDEPENDENT_AMBULATORY_CARE_PROVIDER_SITE_OTHER): Payer: Medicare HMO | Admitting: Vascular Surgery

## 2019-07-13 ENCOUNTER — Telehealth (HOSPITAL_COMMUNITY): Payer: Self-pay

## 2019-07-13 ENCOUNTER — Ambulatory Visit (HOSPITAL_COMMUNITY): Admission: RE | Admit: 2019-07-13 | Payer: Medicare HMO | Source: Ambulatory Visit

## 2019-07-13 VITALS — BP 100/65 | HR 92 | Temp 95.7°F | Resp 18

## 2019-07-13 DIAGNOSIS — I739 Peripheral vascular disease, unspecified: Secondary | ICD-10-CM

## 2019-07-13 DIAGNOSIS — I5022 Chronic systolic (congestive) heart failure: Secondary | ICD-10-CM

## 2019-07-13 NOTE — Telephone Encounter (Signed)
Late entry: Wife stopped by office this afternoon to state that patient was too weak to get out of car.  He had multiple doctors appt today.  Pt does have an appt tomorrow with PCP, advised her to have PCP draw f/u cbc for elevated WBC.  Copies of labs given to her for PCP to review. Verbalized appreciation.

## 2019-07-13 NOTE — Progress Notes (Signed)
Patient name: Rodney Sullivan MRN: [id number] DOB: 1941-07-01 Sex: male  REASON FOR VISIT: Follow-up  HPI: Rodney Sullivan is a 79 y.o. male with multiple medical problems including coronary disease status post recent STEMI as well as diabetes and peripheral vascular disease that presents for ongoing follow-up for evaluation of left BKA and right lower extremity tissue loss.  I recently performed right lower extremity arteriogram with attempt at revascularization for a dry ulcer on the right 3rd toe.  Ultimately had severe tibial disease and I could not cross anything antegrade.  Only vessel that made it to the ankle with reconstitution was the posterior tibial.  On follow-up today he states that the toe was partially debrided by podiatry and he is now putting Betadine paint on the toe.  His wife has noticed it is not healing with some progression.  Separately the left BKA continues to heal without issue.  Unfortunately was recently hospitalized with a STEMI and required several coronary stents for LAD lesion.  Now on dual antiplatelet therapy with aspirin and Brilinta.  Feels weak and fatigued.  Past Medical History:  Diagnosis Date  . Arthritis   . Bacteremia due to Escherichia coli 11/2013  . Cancer (HCC)    basal skin- arm, arm squamous  . Crohn disease ([place])   . Diabetes mellitus without complication ([place])   . DM (diabetes mellitus), type 2 with peripheral vascular complications ([place]) 1/85/6314  . DVT (deep venous thrombosis) ([place]) 11/27/13  . Dyspnea    at times  . History of kidney stones    passed  . History of skin cancer    ARMS & FACE  . Peripheral arterial disease ([place]) 02/15/2019  . Pulmonary embolism ([place]) 11/27/13   . Shoulder problem    left torn ligament-   . STEMI (ST elevation myocardial infarction) ([place]) 06/18/2019    Past Surgical History:  Procedure Laterality Date  . ABDOMINAL AORTOGRAM N/A 12/14/2018   Procedure: ABDOMINAL AORTOGRAM;  Surgeon: Waynetta Sandy, MD;  Location: [place] [facility];  Service: Cardiovascular;  Laterality: N/A;  . ABDOMINAL AORTOGRAM W/LOWER EXTREMITY Bilateral 02/17/2019   Procedure: ABDOMINAL AORTOGRAM W/LOWER EXTREMITY;  Surgeon: [name], MD;  Location: [place] [facility];  Service: Cardiovascular;  Laterality: Bilateral;  . ABDOMINAL SURGERY     Bowel resection x2  . AMPUTATION Right 12/21/2018   Procedure: RIGHT LONG FINGER AMPUTATION;  Surgeon: Leanora Cover, MD;  Location: [place];  Service: Orthopedics;  Laterality: Right;  . AMPUTATION Left 03/03/2019   Procedure: AMPUTATION BELOW KNEE;  Surgeon: [name], MD;  Location: [place];  Service: Vascular;  Laterality: Left;  . APPLICATION OF WOUND VAC Left 04/09/2019   Procedure: APPLICATION OF WOUND VAC;  Surgeon: [name], MD;  Location: [place];  Service: Vascular;  Laterality: Left;  . CHOLECYSTECTOMY    . COLON SURGERY    . CORONARY STENT INTERVENTION N/A 06/18/2019   Procedure: CORONARY STENT INTERVENTION;  Surgeon: Martinique, Peter M, MD;  Location: [place] [facility];  Service: Cardiovascular;  Laterality: N/A;  . CORONARY/GRAFT ACUTE MI REVASCULARIZATION N/A 06/18/2019   Procedure: CORONARY/GRAFT ACUTE MI REVASCULARIZATION;  Surgeon: Martinique, Peter M, MD;  Location: [place] [facility];  Service: Cardiovascular;  Laterality: N/A;  . HIP SURGERY Bilateral    replacement  . JOINT REPLACEMENT    . LEFT HEART CATH AND CORONARY ANGIOGRAPHY N/A 06/18/2019   Procedure: LEFT HEART CATH AND CORONARY ANGIOGRAPHY;  Surgeon: Martinique, Peter  M, MD;  Location: [place] [facility];  Service: Cardiovascular;  Laterality: N/A;  . LOWER EXTREMITY ANGIOGRAPHY Right 12/14/2018   Procedure: LOWER EXTREMITY ANGIOGRAPHY;  Surgeon: Waynetta Sandy, MD;  Location: [place] [facility];  Service: Cardiovascular;  Laterality: Right;  . LOWER EXTREMITY ANGIOGRAPHY N/A 06/09/2019   Procedure: LOWER EXTREMITY ANGIOGRAPHY;  Surgeon:  [name], MD;  Location: [place] [facility];  Service: Cardiovascular;  Laterality: N/A;  . PERIPHERAL VASCULAR ATHERECTOMY  02/17/2019   Procedure: PERIPHERAL VASCULAR ATHERECTOMY;  Surgeon: [name], MD;  Location: [place] [facility];  Service: Cardiovascular;;  . RIGHT HEART CATH N/A 06/19/2019   Procedure: RIGHT HEART CATH;  Surgeon: Sherren Mocha, MD;  Location: [place] [facility];  Service: Cardiovascular;  Laterality: N/A;  . TEE WITHOUT CARDIOVERSION N/A 06/21/2019   Procedure: TRANSESOPHAGEAL ECHOCARDIOGRAM (TEE);  Surgeon: Larey Dresser, MD;  Location[facility];  Service: Cardiovascular;  Laterality: N/A;  . TEMPORARY PACEMAKER N/A 06/19/2019   Procedure: TEMPORARY PACEMAKER;  Surgeon: Sherren Mocha, MD;  Location: [place] [facility];  Service: Cardiovascular;  Laterality: N/A;  . UPPER EXTREMITY ANGIOGRAPHY Right 12/14/2018   Procedure: Right UPPER EXTREMITY ANGIOGRAPHY;  Surgeon: Waynetta Sandy, MD;  Location: [place] [facility];  Service: Cardiovascular;  Laterality: Right;  . WOUND DEBRIDEMENT Left 04/09/2019   Procedure: DEBRIDEMENT WOUND LEFT BELOW KNEE STUMP;  Surgeon: [name], MD;  [facility];  Service: Vascular;  Laterality: Left;    Family History  Problem Relation Age of Onset  . Hypertension Mother     SOCIAL HISTORY: Social History   Tobacco Use  . Smoking status: Former Smoker    Years: 10.00    Types: Pipe  . Smokeless tobacco: Former Systems developer    Quit date: 12/08/1979  Substance Use Topics  . Alcohol use: No    Alcohol/week: 0.0 standard drinks    Allergies  Allergen Reactions  . Ciprofloxacin Other (See Comments)    tendons hurt  . Shrimp [Shellfish Allergy]     Current Outpatient Medications  Medication Sig Dispense Refill  . acetaminophen (TYLENOL) 500 MG tablet Take 500 mg by mouth every 6 (six) hours as needed (pain.).     Marland Kitchen albuterol (VENTOLIN HFA) 108 (90 Base) MCG/ACT inhaler Inhale 2  puffs into the lungs every 4 (four) hours as needed for wheezing or shortness of breath.     . allopurinol (ZYLOPRIM) 100 MG tablet Take 100 mg by mouth daily.    Marland Kitchen aspirin EC 81 MG tablet Take 81 mg by mouth daily with supper.     . brimonidine-timolol (COMBIGAN) 0.2-0.5 % ophthalmic solution Place 1 drop into both eyes 2 (two) times a day.    . carvedilol (COREG) 3.125 MG tablet Take 1 tablet (3.125 mg total) by mouth 2 (two) times daily with a meal. 60 tablet 6  . Certolizumab Pegol (CIMZIA) 2 X 200 MG KIT Inject 400 mg into the skin every 28 (twenty-eight) days.    . Cholecalciferol (VITAMIN D) 50 MCG (2000 UT) tablet Take 2,000 Units by mouth daily.    . Cyanocobalamin (B-12) 2500 MCG TABS Take 2,500 mcg by mouth daily.    Marland Kitchen Dexamethasone Acetate 8 MG/ML SUSP Inject 8 mg into the muscle daily as needed (for Crohn's flare).    . ferrous sulfate 325 (65 FE) MG tablet Take 325 mg by mouth daily.    . furosemide (LASIX) 40 MG tablet Take 0.5 tablets (20 mg total)  by mouth daily. 30 tablet 5  . latanoprost (XALATAN) 0.005 % ophthalmic solution Place 1 drop into both eyes at bedtime.    Marland Kitchen LORazepam (ATIVAN) 0.5 MG tablet Take 0.5 mg by mouth daily as needed for anxiety.    . mesalamine (PENTASA) 500 MG CR capsule Take 2,000 mg by mouth 2 (two) times daily.    . Multiple Vitamin (MULTIVITAMIN WITH MINERALS) TABS Take 1 tablet by mouth daily.    . Omega-3 Fatty Acids (FISH OIL) 1000 MG CAPS Take 1,000 mg by mouth daily.    . pantoprazole (PROTONIX) 40 MG tablet Take 40 mg by mouth daily in the afternoon.     . predniSONE (DELTASONE) 10 MG tablet Take 20 mg by mouth daily after breakfast. Total daily dose=22.5 mg (take with 0.5 tablet 53m tablet)    . predniSONE (DELTASONE) 5 MG tablet Take 2.5 mg by mouth daily. Total daily dose=22.5 mg (take with #2-10 mg tablets)    . rosuvastatin (CRESTOR) 40 MG tablet Take 1 tablet (40 mg total) by mouth daily at 6 PM. 30 tablet 6  . spironolactone (ALDACTONE)  25 MG tablet Take 0.5 tablets (12.5 mg total) by mouth every evening. 30 tablet 6  . ticagrelor (BRILINTA) 90 MG TABS tablet Take 1 tablet (90 mg total) by mouth 2 (two) times daily. 60 tablet 6  . traMADol (ULTRAM) 50 MG tablet Take 1 tablet (50 mg total) by mouth at bedtime as needed for moderate pain. (Patient taking differently: Take 50 mg by mouth every 6 (six) hours as needed for moderate pain. )     No current facility-administered medications for this visit.    REVIEW OF SYSTEMS:  _0  denotes positive finding, _1  denotes negative finding Cardiac  Comments:  Chest pain or chest pressure:    Shortness of breath upon exertion:    Short of breath when lying flat:    Irregular heart rhythm:        Vascular    Pain in calf, thigh, or hip brought on by ambulation:    Pain in feet at night that wakes you up from your sleep:     Blood clot in your veins:    Leg swelling:         Pulmonary    Oxygen at home:    Productive cough:     Wheezing:         Neurologic    Sudden weakness in arms or legs:     Sudden numbness in arms or legs:     Sudden onset of difficulty speaking or slurred speech:    Temporary loss of vision in one eye:     Problems with dizziness:         Gastrointestinal    Blood in stool:     Vomited blood:         Genitourinary    Burning when urinating:     Blood in urine:        Psychiatric    Major depression:         Hematologic    Bleeding problems:    Problems with blood clotting too easily:        Skin    Rashes or ulcers:        Constitutional    Fever or chills:      PHYSICAL EXAM: Vitals:   07/13/19 1225  BP: 100/65  Pulse: 92  Resp: 18  Temp: (!) 95.7 F (35.4 C)  TempSrc:  Temporal    GENERAL: The patient is a well-nourished male, in no acute distress. The vital signs are documented above. CARDIAC: There is a regular rate and rhythm.  VASCULAR:  BKA healing Third toe wound as pictured below        DATA:    None  Assessment/Plan:  79 year old male with critical limb ischemia of the bilateral lower extremities that previously underwent left BKA and attempted antegrade recanalization of severe tibial disease on the right.  Ultimately the BKA continues to heal as shown above.  I think he has had progression of tissue loss of the right third toe.  Discussed with him and his wife on the phone the only other option would be attempted retrograde recanalization via posterior tibial access at the ankle.  That being said he was recently hospitalized with a STEMI and required stenting of his proximal to mid LAD lesion.  Ultimately, today he looks a bit worn down physically and recently changed status to DNR.  In discussing with the patient and his wife he wants to avoid the hospital all cost with Covid.  I did explain he is at high risk for right lower extremity limb loss even with further intervention.  He will follow-up with me again in 2 to 3 weeks for further discussion and wound check.  Discussed they call we can get him on the schedule if he changes his mind about retrograde attempted revascularization.   [name], MD [facility] Office: [phone]

## 2019-07-13 NOTE — Telephone Encounter (Signed)
Pt and wife aware of results of chest xray.  Also made aware of blood work done yesterday. Wife asking if CBC was done and it was not.  Patient has an appt with Dr Carlis Abbott today and wants him to have CBC. Per Dr Aundra Dubin ok to draw CBC. Added to appt notes for Dr Carlis Abbott or patient will stop by our office to get it done today.

## 2019-07-13 NOTE — Telephone Encounter (Signed)
-----   Message from Larey Dresser, MD sent at 07/12/2019  3:36 PM EST ----- No active disease.

## 2019-07-20 ENCOUNTER — Encounter (HOSPITAL_COMMUNITY): Payer: Self-pay | Admitting: Emergency Medicine

## 2019-07-20 ENCOUNTER — Emergency Department (HOSPITAL_COMMUNITY)
Admission: EM | Admit: 2019-07-20 | Discharge: 2019-07-20 | Discharge status: Against Medical Advice | Payer: Medicare HMO | Attending: Emergency Medicine | Admitting: Emergency Medicine

## 2019-07-20 DIAGNOSIS — Z5321 Procedure and treatment not carried out due to patient leaving prior to being seen by health care provider: Secondary | ICD-10-CM | POA: Insufficient documentation

## 2019-07-20 DIAGNOSIS — K922 Gastrointestinal hemorrhage, unspecified: Secondary | ICD-10-CM | POA: Diagnosis present

## 2019-07-20 LAB — CBC
HCT: 33.8 % — ABNORMAL LOW (ref 39.0–52.0)
Hemoglobin: 9.7 g/dL — ABNORMAL LOW (ref 13.0–17.0)
MCH: 27 pg (ref 26.0–34.0)
MCHC: 28.7 g/dL — ABNORMAL LOW (ref 30.0–36.0)
MCV: 94.2 fL (ref 80.0–100.0)
Platelets: 266 10*3/uL (ref 150–400)
RBC: 3.59 MIL/uL — ABNORMAL LOW (ref 4.22–5.81)
RDW: 25 % — ABNORMAL HIGH (ref 11.5–15.5)
WBC: 9.4 10*3/uL (ref 4.0–10.5)
nRBC: 6.5 % — ABNORMAL HIGH (ref 0.0–0.2)

## 2019-07-20 LAB — COMPREHENSIVE METABOLIC PANEL
ALT: 72 U/L — ABNORMAL HIGH (ref 0–44)
AST: 51 U/L — ABNORMAL HIGH (ref 15–41)
Albumin: 2.8 g/dL — ABNORMAL LOW (ref 3.5–5.0)
Alkaline Phosphatase: 74 U/L (ref 38–126)
Anion gap: 11 (ref 5–15)
BUN: 21 mg/dL (ref 8–23)
CO2: 19 mmol/L — ABNORMAL LOW (ref 22–32)
Calcium: 8.8 mg/dL — ABNORMAL LOW (ref 8.9–10.3)
Chloride: 109 mmol/L (ref 98–111)
Creatinine, Ser: 1.4 mg/dL — ABNORMAL HIGH (ref 0.61–1.24)
GFR calc Af Amer: 56 mL/min — ABNORMAL LOW (ref >60)
GFR calc non Af Amer: 48 mL/min — ABNORMAL LOW (ref >60)
Glucose, Bld: 263 mg/dL — ABNORMAL HIGH (ref 70–99)
Potassium: 4.6 mmol/L (ref 3.5–5.1)
Sodium: 139 mmol/L (ref 135–145)
Total Bilirubin: 0.4 mg/dL (ref 0.3–1.2)
Total Protein: 5.4 g/dL — ABNORMAL LOW (ref 6.5–8.1)

## 2019-07-20 LAB — LIPASE, BLOOD: Lipase: 73 U/L — ABNORMAL HIGH (ref 11–51)

## 2019-07-20 MED ORDER — SODIUM CHLORIDE 0.9% FLUSH: 3.0000 mL | Freq: Once | INTRAVENOUS | Status: DC

## 2019-07-20 NOTE — ED Notes (Addendum)
Wife came in requesting blanket for pt, this RN offered pt a blanket and he declined. Pt A&O x4. Wife aware he does not meet criteria for a visitor.

## 2019-07-20 NOTE — ED Notes (Signed)
Rodney Sullivan (Wife/Medical Power of Atty# [phone])called/would like to come in to visit/waiting outside of ED.  Thank you

## 2019-07-20 NOTE — ED Triage Notes (Signed)
Pt arrives [facility] from home with c/c of "rust colored" stool that started over night. Pt has hx of chron's pt also has generalized abd pain.

## 2019-07-20 NOTE — ED Notes (Signed)
Pt's wife states she and her husband can not wait longer and takes the patient out of the ED.

## 2019-07-21 ENCOUNTER — Telehealth (HOSPITAL_COMMUNITY): Payer: Self-pay

## 2019-07-21 DIAGNOSIS — I5022 Chronic systolic (congestive) heart failure: Secondary | ICD-10-CM

## 2019-07-21 NOTE — Telephone Encounter (Signed)
He needs to get a CBC done to decide on further steps.

## 2019-07-21 NOTE — Telephone Encounter (Signed)
Pt reported to the ED yesterday with rust colored stools. Pt on brilinta and asa 62m. Pt left AMA yesterday stating that "the wait was too long". Wife called this morning let [place] that he has not had any further rust colored stools, that they have went back to normal for him.   Wife wants to know if they should do anything with the brilinta or asa?

## 2019-07-21 NOTE — Addendum Note (Signed)
Addended by: Lindley Magnus A on: 07/21/2019 02:30 PM   Modules accepted: Orders

## 2019-07-21 NOTE — Telephone Encounter (Signed)
CBC done yesterday in ED. Want another?

## 2019-07-21 NOTE — Telephone Encounter (Signed)
Per Dr. Aundra Dubin pt is to have another CBC drawn Monday 07/26/19 as long as he isnt having any more bloody stools. Spoke with pt and wife and wife states that pt has bloody nose that is "drying up" and that it is likely from the dry air in the home. Advised if worsening to let us know. Both agreeable to advice and to upcoming labs. Advised per Dr. Aundra Dubin for pt to continue meds as prescribed.

## 2019-07-22 LAB — PATHOLOGIST SMEAR REVIEW

## 2019-07-26 ENCOUNTER — Ambulatory Visit (HOSPITAL_COMMUNITY)
Admission: RE | Admit: 2019-07-26 | Discharge: 2019-07-26 | Disposition: A | Discharge status: Home / Self Care | Payer: Medicare HMO | Source: Ambulatory Visit | Attending: Internal Medicine | Admitting: Internal Medicine

## 2019-07-26 ENCOUNTER — Other Ambulatory Visit: Payer: Self-pay

## 2019-07-26 DIAGNOSIS — I5022 Chronic systolic (congestive) heart failure: Secondary | ICD-10-CM | POA: Diagnosis present

## 2019-07-26 LAB — CBC
HCT: 34.7 % — ABNORMAL LOW (ref 39.0–52.0)
Hemoglobin: 9.6 g/dL — ABNORMAL LOW (ref 13.0–17.0)
MCH: 27 pg (ref 26.0–34.0)
MCHC: 27.7 g/dL — ABNORMAL LOW (ref 30.0–36.0)
MCV: 97.5 fL (ref 80.0–100.0)
Platelets: 352 10*3/uL (ref 150–400)
RBC: 3.56 MIL/uL — ABNORMAL LOW (ref 4.22–5.81)
RDW: 23.1 % — ABNORMAL HIGH (ref 11.5–15.5)
WBC: 7.4 10*3/uL (ref 4.0–10.5)
nRBC: 2.2 % — ABNORMAL HIGH (ref 0.0–0.2)

## 2019-08-02 ENCOUNTER — Telehealth (HOSPITAL_COMMUNITY): Payer: Self-pay

## 2019-08-02 NOTE — Telephone Encounter (Signed)
HH orders signed and faxed to [facility]

## 2019-08-03 ENCOUNTER — Ambulatory Visit: Payer: Medicare HMO | Admitting: Vascular Surgery

## 2019-08-06 ENCOUNTER — Other Ambulatory Visit (HOSPITAL_COMMUNITY): Payer: Medicare HMO

## 2019-08-06 ENCOUNTER — Encounter (HOSPITAL_COMMUNITY): Payer: Medicare HMO | Admitting: Cardiology

## 2019-08-16 ENCOUNTER — Telehealth (HOSPITAL_COMMUNITY): Payer: Self-pay

## 2019-08-16 NOTE — Telephone Encounter (Signed)
HH orders signed and faxed

## 2019-08-19 ENCOUNTER — Telehealth (HOSPITAL_COMMUNITY): Payer: Self-pay | Admitting: Cardiology

## 2019-08-19 ENCOUNTER — Telehealth: Payer: Self-pay | Admitting: Vascular Surgery

## 2019-08-19 NOTE — Telephone Encounter (Signed)
Patients family requested [facility] to call and report patient passed this morning  [phone]  Will forward to MD as Juluis Rainier

## 2019-08-19 NOTE — Telephone Encounter (Signed)
Ria Comment Nurse with hospice called states patient passed away this morning 09/07/19.

## 2019-09-06 DEATH — deceased

## 2020-07-21 IMAGING — DX LEFT FOOT - 2 VIEW
2 series · 2 of 2 positions shown · non-contrast
Comparison: None.

CLINICAL DATA: Fever, foot infection

EXAM:
LEFT FOOT - 2 VIEW

[foot ap]
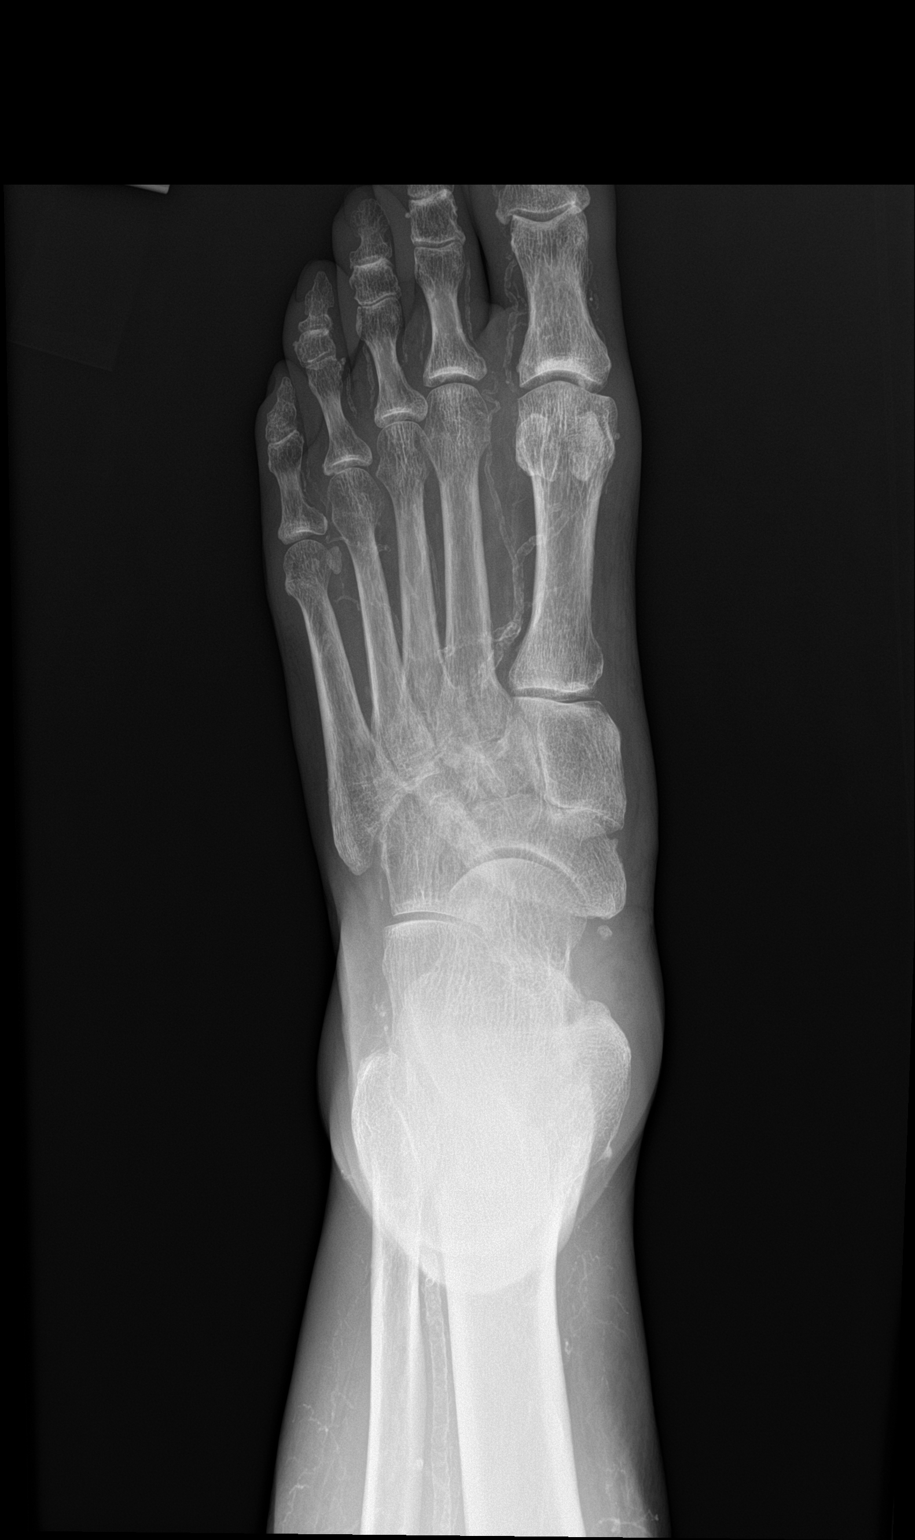

[foot lat]
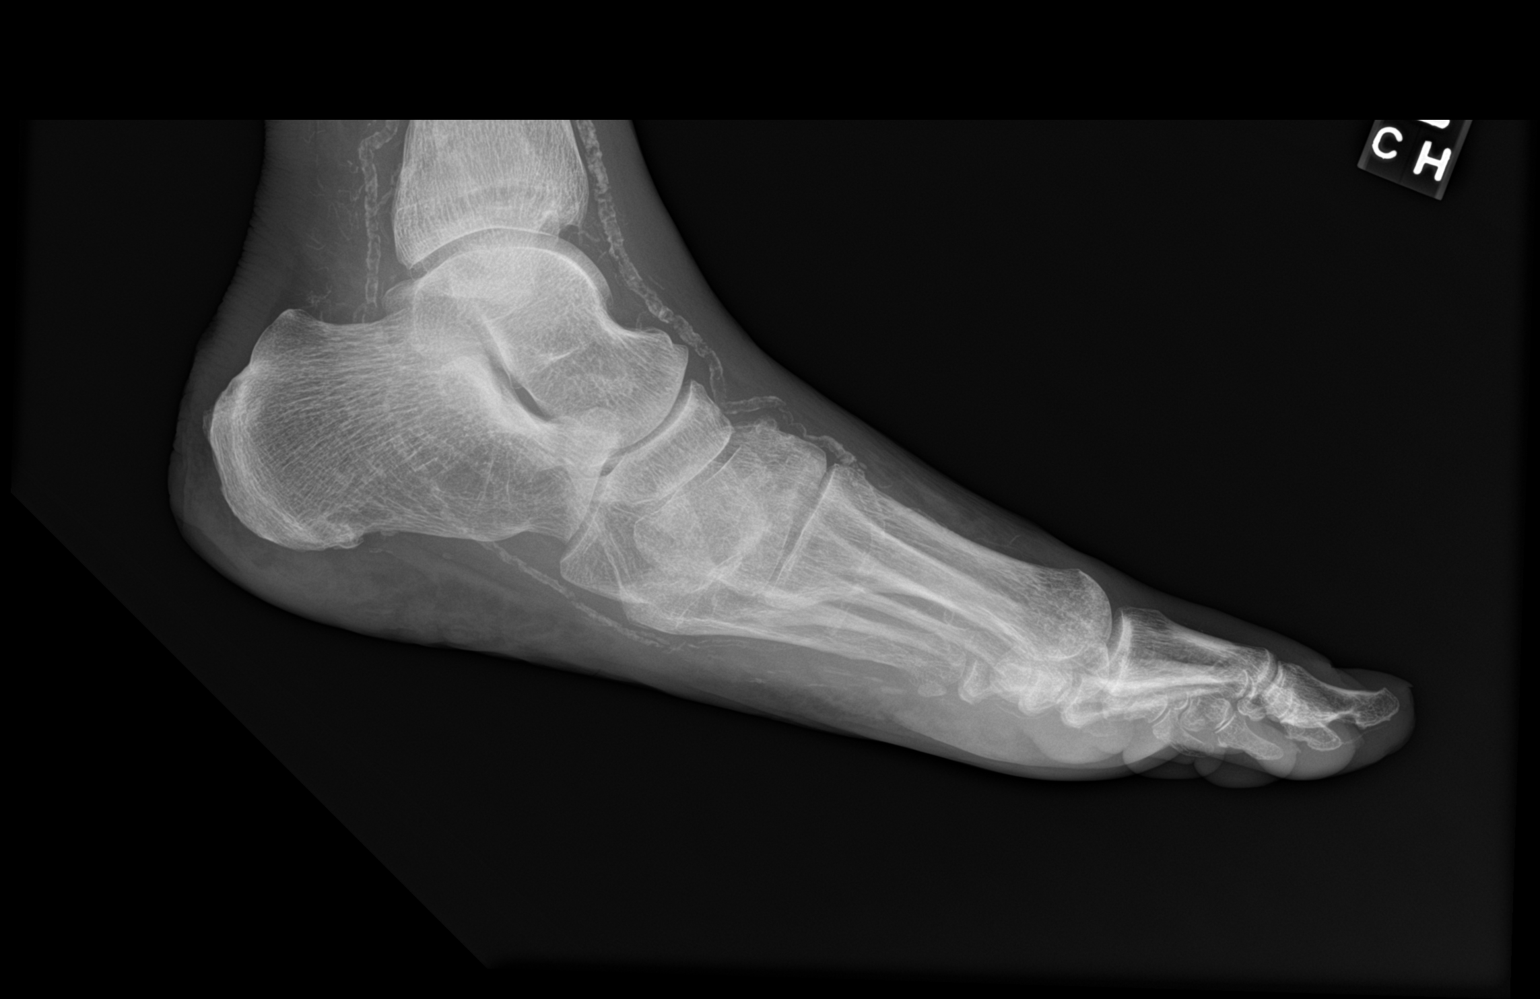

[2 of 2 positions shown; findings below may reference images not displayed]

FINDINGS: No fracture or dislocation is seen.

The joint spaces are preserved.

No evidence of soft tissue gas. Vascular calcifications
calcification.
IMPRESSION: No radiographic findings to suggest acute osteomyelitis. No evidence
of soft tissue gas.

## 2020-07-21 IMAGING — CR CHEST - 2 VIEW
3 series · 3 of 3 positions shown · non-contrast
Comparison: Chest radiograph dated 07/15/2017

CLINICAL DATA: 78-year-old male with fever. Concern for sepsis.

EXAM:
CHEST - 2 VIEW

[w chest lat]
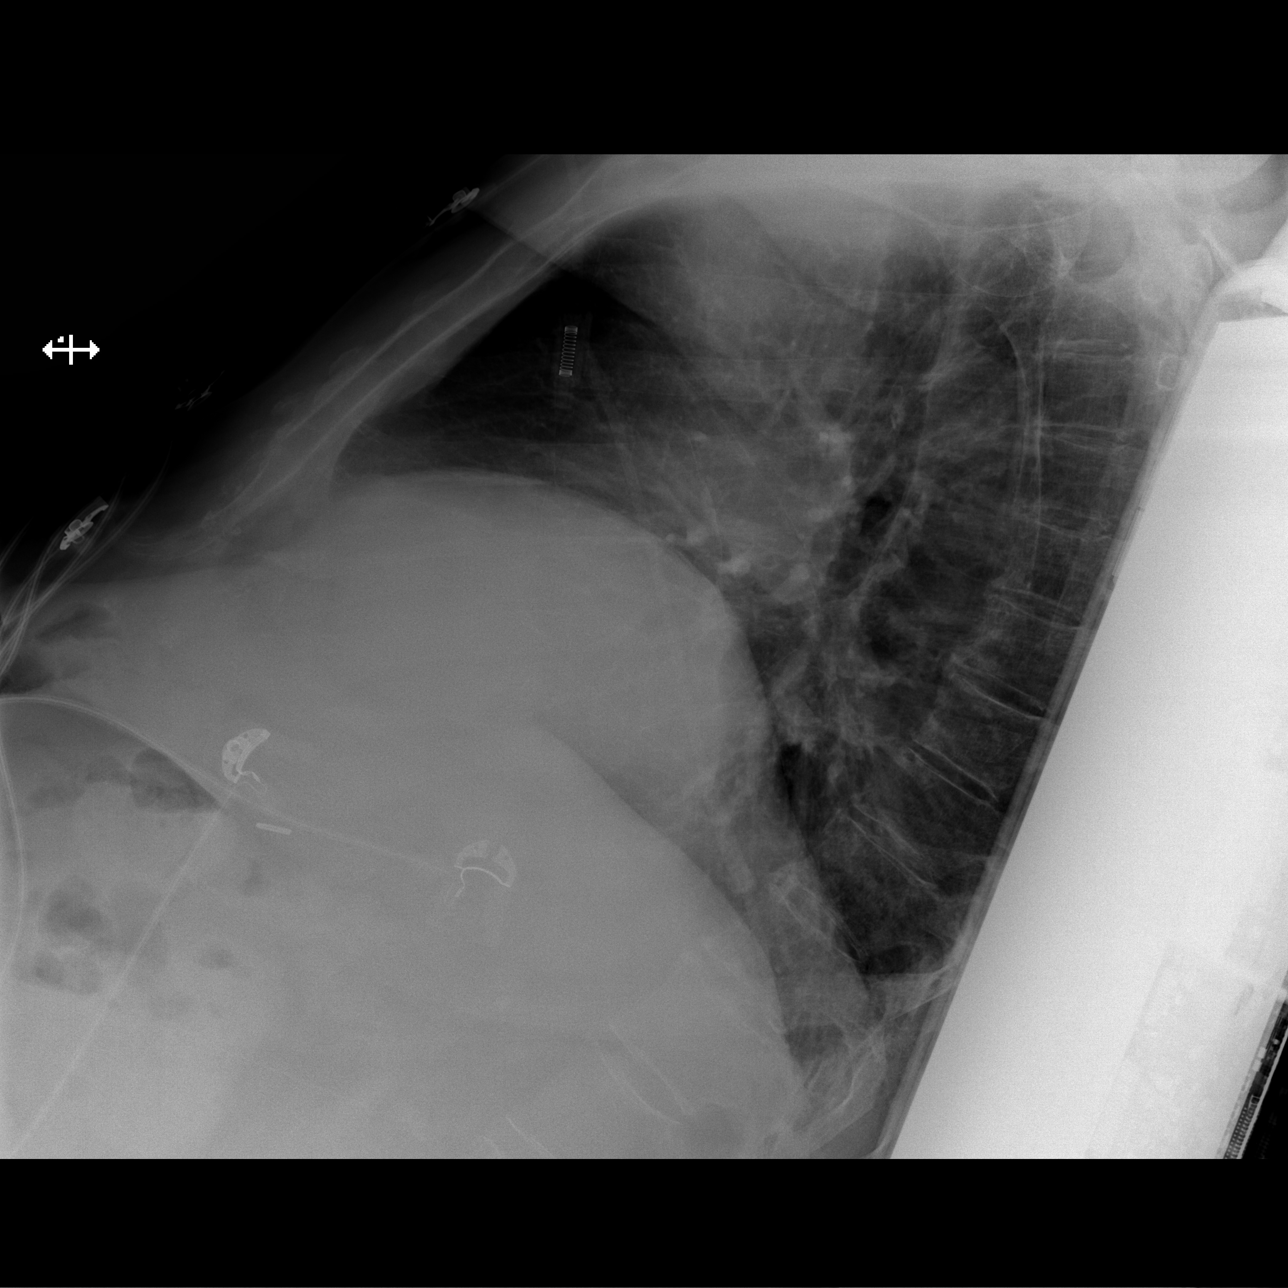

[x chest ap (1 of 2)]
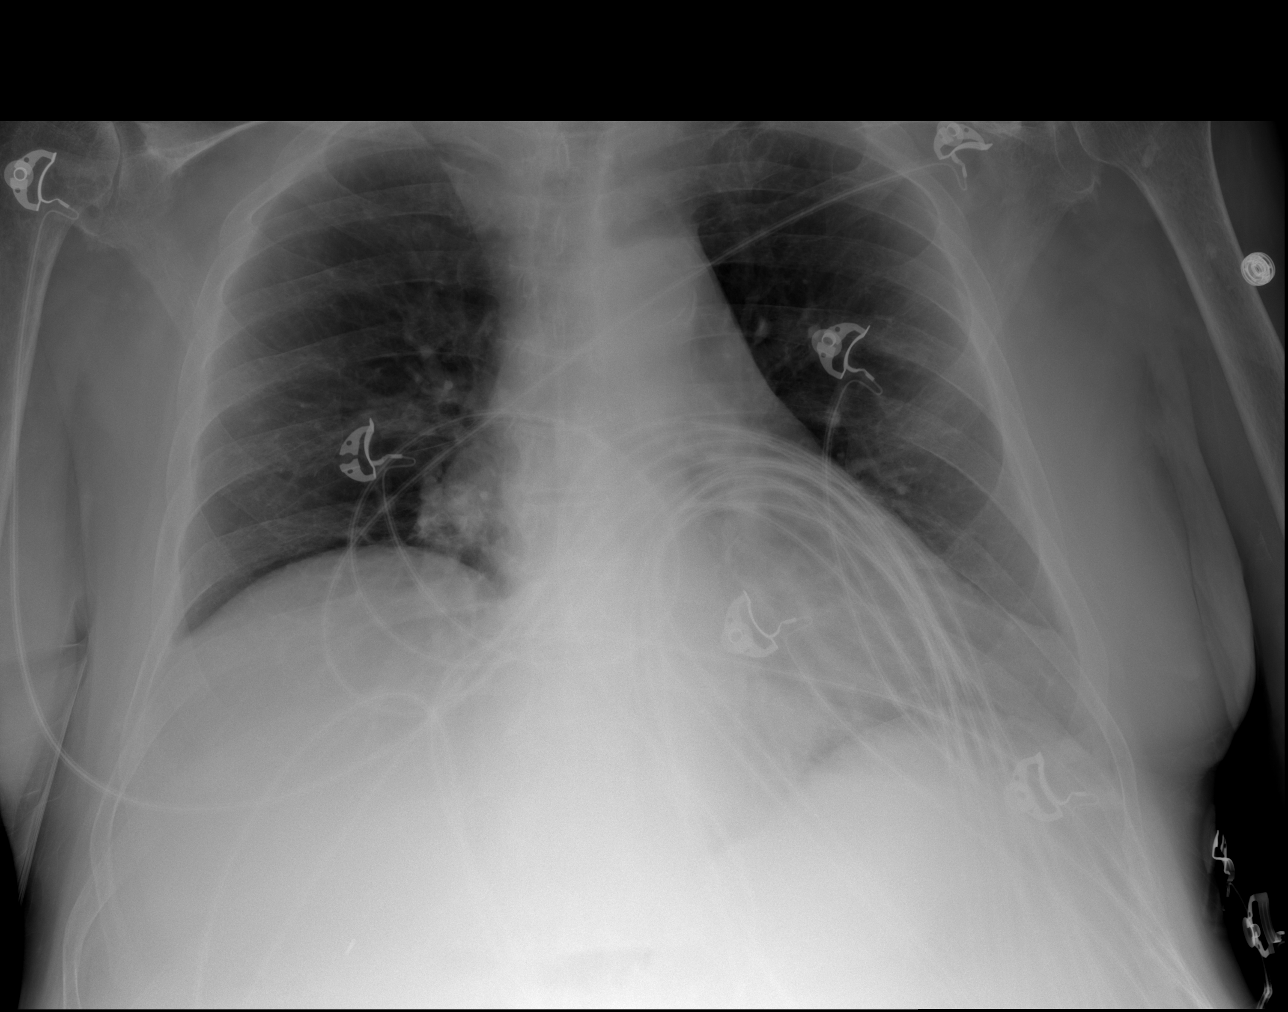

[x chest ap (2 of 2)]
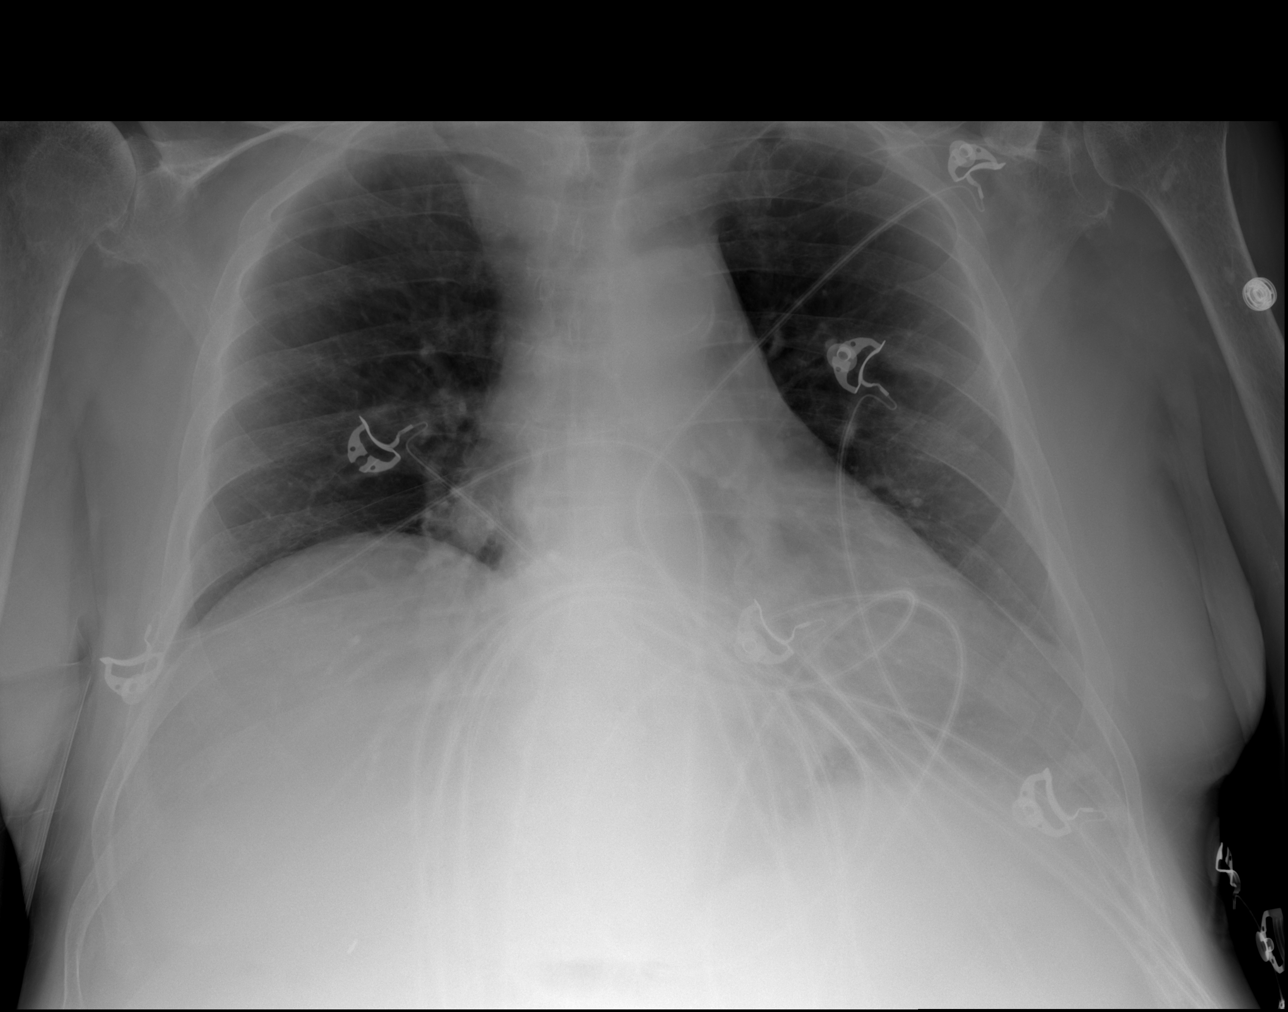

[3 of 3 positions shown; findings below may reference images not displayed]

FINDINGS: Left lung base atelectatic changes. No focal consolidation, pleural
effusion, or pneumothorax. Stable mild cardiomegaly. Atherosclerotic
calcification of the aortic arch. No acute osseous pathology.
IMPRESSION: No active cardiopulmonary disease.  No interval change.
